# Patient Record
Sex: Male | Born: 1955 | Race: Black or African American | Hispanic: No | Marital: Married | Smoking: Never smoker
Health system: Southern US, Community
[De-identification: ages and names within clinical notes are randomized; demographics above are authoritative.]

## PROBLEM LIST (undated history)

## (undated) DIAGNOSIS — G4730 Sleep apnea, unspecified: Secondary | ICD-10-CM

## (undated) DIAGNOSIS — M1990 Unspecified osteoarthritis, unspecified site: Secondary | ICD-10-CM

## (undated) DIAGNOSIS — L404 Guttate psoriasis: Secondary | ICD-10-CM

## (undated) DIAGNOSIS — I4892 Unspecified atrial flutter: Secondary | ICD-10-CM

## (undated) DIAGNOSIS — I4891 Unspecified atrial fibrillation: Secondary | ICD-10-CM

## (undated) DIAGNOSIS — E785 Hyperlipidemia, unspecified: Secondary | ICD-10-CM

## (undated) DIAGNOSIS — I499 Cardiac arrhythmia, unspecified: Secondary | ICD-10-CM

## (undated) DIAGNOSIS — I10 Essential (primary) hypertension: Secondary | ICD-10-CM

## (undated) HISTORY — DX: Sleep apnea, unspecified: G47.30

## (undated) HISTORY — DX: Unspecified atrial flutter: I48.92

## (undated) HISTORY — DX: Hyperlipidemia, unspecified: E78.5

## (undated) HISTORY — DX: Guttate psoriasis: L40.4

## (undated) HISTORY — PX: TOOTH EXTRACTION: SUR596

## (undated) HISTORY — DX: Essential (primary) hypertension: I10

---
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
## 1998-12-07 ENCOUNTER — Encounter: Payer: Self-pay | Admitting: Emergency Medicine

## 1998-12-08 ENCOUNTER — Inpatient Hospital Stay (HOSPITAL_COMMUNITY): Admission: EM | Admit: 1998-12-08 | Discharge: 1998-12-10 | Payer: Self-pay | Admitting: Emergency Medicine

## 2005-01-14 ENCOUNTER — Ambulatory Visit: Payer: Self-pay | Admitting: Internal Medicine

## 2005-01-21 ENCOUNTER — Ambulatory Visit: Payer: Self-pay | Admitting: Internal Medicine

## 2005-02-17 ENCOUNTER — Ambulatory Visit: Payer: Self-pay | Admitting: Internal Medicine

## 2005-02-27 ENCOUNTER — Ambulatory Visit: Payer: Self-pay | Admitting: Internal Medicine

## 2006-03-25 ENCOUNTER — Ambulatory Visit: Payer: Self-pay | Admitting: Internal Medicine

## 2006-03-25 LAB — CONVERTED CEMR LAB
ALT: 23 units/L (ref 0–40)
AST: 21 units/L (ref 0–37)
Albumin: 3.9 g/dL (ref 3.5–5.2)
Alkaline Phosphatase: 51 units/L (ref 39–117)
BUN: 9 mg/dL (ref 6–23)
Basophils Absolute: 0 10*3/uL (ref 0.0–0.1)
Basophils Relative: 0.4 % (ref 0.0–1.0)
Bilirubin Urine: NEGATIVE
CO2: 32 meq/L (ref 19–32)
Calcium: 9.3 mg/dL (ref 8.4–10.5)
Chloride: 103 meq/L (ref 96–112)
Chol/HDL Ratio, serum: 5.5
Cholesterol: 235 mg/dL (ref 0–200)
Creatinine, Ser: 1.2 mg/dL (ref 0.4–1.5)
Eosinophil percent: 0.9 % (ref 0.0–5.0)
GFR calc non Af Amer: 69 mL/min
Glomerular Filtration Rate, Af Am: 83 mL/min/{1.73_m2}
Glucose, Bld: 123 mg/dL — ABNORMAL HIGH (ref 70–99)
HCT: 48.2 % (ref 39.0–52.0)
HDL: 42.5 mg/dL (ref >39.0)
Hemoglobin, Urine: NEGATIVE
Hemoglobin: 16 g/dL (ref 13.0–17.0)
Ketones, ur: NEGATIVE mg/dL
LDL DIRECT: 187.9 mg/dL
Leukocytes, UA: NEGATIVE
Lymphocytes Relative: 37.2 % (ref 12.0–46.0)
MCHC: 33.1 g/dL (ref 30.0–36.0)
MCV: 91 fL (ref 78.0–100.0)
Monocytes Absolute: 0.4 10*3/uL (ref 0.2–0.7)
Monocytes Relative: 7.8 % (ref 3.0–11.0)
Neutro Abs: 2.5 10*3/uL (ref 1.4–7.7)
Neutrophils Relative %: 53.7 % (ref 43.0–77.0)
Nitrite: NEGATIVE
Platelets: 269 10*3/uL (ref 150–400)
Potassium: 4 meq/L (ref 3.5–5.1)
RBC: 5.29 M/uL (ref 4.22–5.81)
RDW: 12.2 % (ref 11.5–14.6)
Sodium: 141 meq/L (ref 135–145)
Specific Gravity, Urine: 1.02 (ref 1.000–1.03)
TSH: 1.1 microintl units/mL (ref 0.35–5.50)
Total Bilirubin: 0.9 mg/dL (ref 0.3–1.2)
Total Protein, Urine: NEGATIVE mg/dL
Total Protein: 7.1 g/dL (ref 6.0–8.3)
Triglyceride fasting, serum: 63 mg/dL (ref 0–149)
Urine Glucose: NEGATIVE mg/dL
Urobilinogen, UA: 0.2 (ref 0.0–1.0)
VLDL: 13 mg/dL (ref 0–40)
WBC: 4.6 10*3/uL (ref 4.5–10.5)
pH: 6 (ref 5.0–8.0)

## 2006-04-01 ENCOUNTER — Ambulatory Visit: Payer: Self-pay | Admitting: Internal Medicine

## 2006-05-01 ENCOUNTER — Ambulatory Visit: Payer: Self-pay | Admitting: Internal Medicine

## 2006-05-01 LAB — CONVERTED CEMR LAB
ALT: 48 units/L — ABNORMAL HIGH (ref 0–40)
AST: 30 units/L (ref 0–37)
Chol/HDL Ratio, serum: 3.7
Cholesterol: 178 mg/dL (ref 0–200)
HDL: 47.7 mg/dL (ref >39.0)
LDL Cholesterol: 118 mg/dL — ABNORMAL HIGH (ref 0–99)
Triglyceride fasting, serum: 63 mg/dL (ref 0–149)
VLDL: 13 mg/dL (ref 0–40)

## 2007-07-27 ENCOUNTER — Ambulatory Visit: Payer: Self-pay | Admitting: Internal Medicine

## 2007-07-27 LAB — CONVERTED CEMR LAB
ALT: 41 units/L (ref 0–53)
AST: 28 units/L (ref 0–37)
Albumin: 4 g/dL (ref 3.5–5.2)
Alkaline Phosphatase: 67 units/L (ref 39–117)
BUN: 9 mg/dL (ref 6–23)
Basophils Absolute: 0.1 10*3/uL (ref 0.0–0.1)
Basophils Relative: 1.1 % — ABNORMAL HIGH (ref 0.0–1.0)
Bilirubin Urine: NEGATIVE
Bilirubin, Direct: 0.1 mg/dL (ref 0.0–0.3)
CO2: 32 meq/L (ref 19–32)
Calcium: 9.5 mg/dL (ref 8.4–10.5)
Chloride: 102 meq/L (ref 96–112)
Cholesterol: 175 mg/dL (ref 0–200)
Creatinine, Ser: 1.1 mg/dL (ref 0.4–1.5)
Eosinophils Absolute: 0.1 10*3/uL (ref 0.0–0.6)
Eosinophils Relative: 2.4 % (ref 0.0–5.0)
GFR calc Af Amer: 92 mL/min
GFR calc non Af Amer: 76 mL/min
Glucose, Bld: 154 mg/dL — ABNORMAL HIGH (ref 70–99)
HCT: 44.6 % (ref 39.0–52.0)
HDL: 36.4 mg/dL — ABNORMAL LOW (ref >39.0)
Hemoglobin, Urine: NEGATIVE
Hemoglobin: 14.2 g/dL (ref 13.0–17.0)
Ketones, ur: NEGATIVE mg/dL
LDL Cholesterol: 126 mg/dL — ABNORMAL HIGH (ref 0–99)
Leukocytes, UA: NEGATIVE
Lymphocytes Relative: 42 % (ref 12.0–46.0)
MCHC: 31.9 g/dL (ref 30.0–36.0)
MCV: 90.4 fL (ref 78.0–100.0)
Monocytes Absolute: 0.4 10*3/uL (ref 0.2–0.7)
Monocytes Relative: 9.1 % (ref 3.0–11.0)
Neutro Abs: 2.1 10*3/uL (ref 1.4–7.7)
Neutrophils Relative %: 45.4 % (ref 43.0–77.0)
Nitrite: NEGATIVE
Platelets: 281 10*3/uL (ref 150–400)
Potassium: 3.4 meq/L — ABNORMAL LOW (ref 3.5–5.1)
RBC: 4.94 M/uL (ref 4.22–5.81)
RDW: 12.5 % (ref 11.5–14.6)
Sodium: 142 meq/L (ref 135–145)
Specific Gravity, Urine: >=1.03 (ref 1.000–1.03)
TSH: 0.85 microintl units/mL (ref 0.35–5.50)
Total Bilirubin: 0.8 mg/dL (ref 0.3–1.2)
Total CHOL/HDL Ratio: 4.8
Total Protein, Urine: NEGATIVE mg/dL
Total Protein: 7.4 g/dL (ref 6.0–8.3)
Triglycerides: 62 mg/dL (ref 0–149)
Urine Glucose: NEGATIVE mg/dL
Urobilinogen, UA: 0.2 (ref 0.0–1.0)
VLDL: 12 mg/dL (ref 0–40)
WBC: 4.7 10*3/uL (ref 4.5–10.5)
pH: 5 (ref 5.0–8.0)

## 2007-08-03 ENCOUNTER — Ambulatory Visit: Payer: Self-pay | Admitting: Internal Medicine

## 2007-08-03 DIAGNOSIS — E1165 Type 2 diabetes mellitus with hyperglycemia: Secondary | ICD-10-CM

## 2007-08-03 DIAGNOSIS — E118 Type 2 diabetes mellitus with unspecified complications: Secondary | ICD-10-CM | POA: Insufficient documentation

## 2007-08-03 DIAGNOSIS — E1169 Type 2 diabetes mellitus with other specified complication: Secondary | ICD-10-CM | POA: Insufficient documentation

## 2007-08-03 DIAGNOSIS — E1129 Type 2 diabetes mellitus with other diabetic kidney complication: Secondary | ICD-10-CM

## 2007-08-03 DIAGNOSIS — E785 Hyperlipidemia, unspecified: Secondary | ICD-10-CM

## 2007-08-03 DIAGNOSIS — I10 Essential (primary) hypertension: Secondary | ICD-10-CM

## 2007-08-03 DIAGNOSIS — IMO0002 Reserved for concepts with insufficient information to code with codable children: Secondary | ICD-10-CM

## 2007-08-03 HISTORY — DX: Type 2 diabetes mellitus with other diabetic kidney complication: E11.29

## 2007-08-03 HISTORY — DX: Reserved for concepts with insufficient information to code with codable children: IMO0002

## 2007-08-03 HISTORY — DX: Essential (primary) hypertension: I10

## 2007-08-03 HISTORY — DX: Type 2 diabetes mellitus with other specified complication: E11.69

## 2007-09-07 ENCOUNTER — Telehealth: Payer: Self-pay | Admitting: Internal Medicine

## 2007-09-13 ENCOUNTER — Telehealth: Payer: Self-pay | Admitting: Internal Medicine

## 2009-04-23 ENCOUNTER — Ambulatory Visit: Payer: Self-pay | Admitting: Internal Medicine

## 2009-04-23 LAB — CONVERTED CEMR LAB
ALT: 28 units/L (ref 0–53)
AST: 21 units/L (ref 0–37)
Albumin: 3.8 g/dL (ref 3.5–5.2)
Alkaline Phosphatase: 65 units/L (ref 39–117)
BUN: 9 mg/dL (ref 6–23)
Basophils Absolute: 0 10*3/uL (ref 0.0–0.1)
Basophils Relative: 0.8 % (ref 0.0–3.0)
Bilirubin Urine: NEGATIVE
Bilirubin, Direct: 0.1 mg/dL (ref 0.0–0.3)
CO2: 30 meq/L (ref 19–32)
Calcium: 8.9 mg/dL (ref 8.4–10.5)
Chloride: 101 meq/L (ref 96–112)
Cholesterol: 224 mg/dL — ABNORMAL HIGH (ref 0–200)
Creatinine, Ser: 1 mg/dL (ref 0.4–1.5)
Direct LDL: 160.6 mg/dL
Eosinophils Absolute: 0.1 10*3/uL (ref 0.0–0.7)
Eosinophils Relative: 3.1 % (ref 0.0–5.0)
GFR calc non Af Amer: 101.28 mL/min (ref >60)
Glucose, Bld: 260 mg/dL — ABNORMAL HIGH (ref 70–99)
HCT: 47.3 % (ref 39.0–52.0)
HDL: 31.6 mg/dL — ABNORMAL LOW (ref >39.00)
Hemoglobin, Urine: NEGATIVE
Hemoglobin: 15.7 g/dL (ref 13.0–17.0)
Ketones, ur: 15 mg/dL
Leukocytes, UA: NEGATIVE
Lymphocytes Relative: 32.9 % (ref 12.0–46.0)
Lymphs Abs: 1.5 10*3/uL (ref 0.7–4.0)
MCHC: 33.1 g/dL (ref 30.0–36.0)
MCV: 92.8 fL (ref 78.0–100.0)
Monocytes Absolute: 0.4 10*3/uL (ref 0.1–1.0)
Monocytes Relative: 9.3 % (ref 3.0–12.0)
Neutro Abs: 2.6 10*3/uL (ref 1.4–7.7)
Neutrophils Relative %: 53.9 % (ref 43.0–77.0)
Nitrite: NEGATIVE
PSA: 3.43 ng/mL (ref 0.10–4.00)
Platelets: 231 10*3/uL (ref 150.0–400.0)
Potassium: 3.7 meq/L (ref 3.5–5.1)
RBC: 5.1 M/uL (ref 4.22–5.81)
RDW: 12.1 % (ref 11.5–14.6)
Sodium: 138 meq/L (ref 135–145)
Specific Gravity, Urine: >=1.03 (ref 1.000–1.030)
TSH: 1.47 microintl units/mL (ref 0.35–5.50)
Total Bilirubin: 0.9 mg/dL (ref 0.3–1.2)
Total CHOL/HDL Ratio: 7
Total Protein: 6.9 g/dL (ref 6.0–8.3)
Triglycerides: 130 mg/dL (ref 0.0–149.0)
Urine Glucose: >=1000 mg/dL
Urobilinogen, UA: 0.2 (ref 0.0–1.0)
VLDL: 26 mg/dL (ref 0.0–40.0)
WBC: 4.6 10*3/uL (ref 4.5–10.5)
pH: 5.5 (ref 5.0–8.0)

## 2009-04-30 ENCOUNTER — Ambulatory Visit: Payer: Self-pay | Admitting: Internal Medicine

## 2009-04-30 DIAGNOSIS — L408 Other psoriasis: Secondary | ICD-10-CM

## 2009-04-30 HISTORY — DX: Other psoriasis: L40.8

## 2009-05-21 ENCOUNTER — Telehealth: Payer: Self-pay | Admitting: Internal Medicine

## 2009-06-01 ENCOUNTER — Telehealth: Payer: Self-pay | Admitting: Internal Medicine

## 2010-06-06 NOTE — Progress Notes (Signed)
Summary: samples  Phone Note Call from Patient   Caller: Patient Call For: Neena Rhymes MD Summary of Call: Patient called requsting samples or rx for benicar. I advised [facility] can pick up samples to get him through. Initial call taken by: Estell Harpin CMA,  May 21, 2009 3:24 PM

## 2010-06-06 NOTE — Progress Notes (Signed)
Summary: 90 day mail order  Phone Note Call from Patient   Caller: Patient Summary of Call: Patient called [facility] need 90 day rx for Benicar. Initial call taken by: Estell Harpin CMA,  June 01, 2009 5:05 PM  Follow-up for Phone Call        rx sent and pt notified Follow-up by: Estell Harpin CMA,  June 01, 2009 5:08 PM    Prescriptions: BENICAR HCT 40-12.5 MG TABS (OLMESARTAN MEDOXOMIL-HCTZ) 1 by mouth once daily  #90 x 3   Entered by:   Estell Harpin CMA   Authorized by:   Neena Rhymes MD   Signed by:   Estell Harpin CMA on 06/01/2009   Method used:   Faxed to ...       [facility]       [address] [place]  [address]       Ph: [phone]       Fax: [phone]   RxID:   [id number]

## 2010-06-12 ENCOUNTER — Telehealth: Payer: Self-pay | Admitting: Internal Medicine

## 2010-06-20 NOTE — Progress Notes (Signed)
Summary: RF  Phone Note Call from Patient   Summary of Call: Patient is requesting refill of med - no name of med or pharm was given.  Initial call taken by: Charlsie Quest, Suarez,  June 12, 2010 3:04 PM  Follow-up for Phone Call        left mess to call office back..............Marland KitchenCharlsie Quest, CMA  June 12, 2010 6:24 PM   Additional Follow-up for Phone Call Additional follow up Details #1::        Pt needs Benicar called into to [facility]---[phone], pt is almost out. Additional Follow-up by: Denice Paradise,  June 13, 2010 3:20 PM    Additional Follow-up for Phone Call Additional follow up Details #2::    called pt back no ansew lmom onlt sent # 90. Pt is overdue for physical. Must see md b4 addtional refills Follow-up by: Tomma Lightning RMA,  June 13, 2010 3:55 PM  Prescriptions: BENICAR HCT 40-12.5 MG TABS (OLMESARTAN MEDOXOMIL-HCTZ) 1 by mouth once daily  #90 x 0   Entered by:   Tomma Lightning RMA   Authorized by:   Neena Rhymes MD   Signed by:   Tomma Lightning RMA on 06/13/2010   Method used:   Faxed to ...       [facility]       [address] [place]  [address]       Ph: [phone]       Fax: [phone]   RxID:   [id number]

## 2010-07-19 ENCOUNTER — Other Ambulatory Visit: Payer: Self-pay

## 2010-07-19 ENCOUNTER — Encounter (INDEPENDENT_AMBULATORY_CARE_PROVIDER_SITE_OTHER): Payer: Self-pay | Admitting: *Deleted

## 2010-07-19 DIAGNOSIS — Z0000 Encounter for general adult medical examination without abnormal findings: Secondary | ICD-10-CM

## 2010-07-25 ENCOUNTER — Encounter: Payer: Self-pay | Admitting: Internal Medicine

## 2010-07-25 ENCOUNTER — Other Ambulatory Visit (INDEPENDENT_AMBULATORY_CARE_PROVIDER_SITE_OTHER): Payer: BC Managed Care – PPO

## 2010-07-25 ENCOUNTER — Ambulatory Visit (INDEPENDENT_AMBULATORY_CARE_PROVIDER_SITE_OTHER): Payer: BC Managed Care – PPO | Admitting: Internal Medicine

## 2010-07-25 VITALS — BP 158/92 | HR 73 | Temp 98.7°F | Ht 77.0 in | Wt 290.0 lb

## 2010-07-25 DIAGNOSIS — Z0000 Encounter for general adult medical examination without abnormal findings: Secondary | ICD-10-CM

## 2010-07-25 DIAGNOSIS — Z1211 Encounter for screening for malignant neoplasm of colon: Secondary | ICD-10-CM

## 2010-07-25 DIAGNOSIS — E119 Type 2 diabetes mellitus without complications: Secondary | ICD-10-CM

## 2010-07-25 DIAGNOSIS — Z136 Encounter for screening for cardiovascular disorders: Secondary | ICD-10-CM

## 2010-07-25 LAB — HEMOGLOBIN A1C: Hgb A1c MFr Bld: 9.5 % — ABNORMAL HIGH (ref 4.6–6.5)

## 2010-07-25 NOTE — Progress Notes (Signed)
Subjective:    Patient ID: Johnny [name], male    DOB: 1955-05-14, 55 y.o.   MRN: [id number]  HPI Johnny [name] presents for a routine exam. He reports that he is doing well with no new medical problems, injuries, or surgeries since his last visit. He is working out - 7/10 days. He feels well. He has gone to 2nd shift which has disrupted eating schedule  Past Medical History  Diagnosis Date  . Hyperlipidemia   . HTN (hypertension)   . Guttate psoriasis   . Diabetes mellitus    Family History  Problem Relation Age of Onset  . Hypertension Father     DOB [birth year]  . Hyperlipidemia Father   . Diabetes Father   . Coronary artery disease Father     with stents  . Nephrolithiasis Father   . Kidney disease Father     on HD  . Memory loss Mother     DOB 61  . Kidney disease Mother     s/p nephrectomy, infection problem  . Dementia Mother   . Prostate cancer Neg Hx   . Colon cancer Neg Hx    History   Social History  . Marital Status: Married    Spouse Name: N/A    Number of Children: N/A  . Years of Education: 12   Occupational History  . Not on file.   Social History Main Topics  . Smoking status: Passive Smoker  . Smokeless tobacco: Not on file  . Alcohol Use: No  . Drug Use: Not on file  . Sexually Active: Not on file   Other Topics Concern  . Not on file   Social History Narrative   [employer] x 4 yearsMarried '942 sons - '95, '96Wife is healthy        Review of Systems  Constitutional: Negative.   HENT: Negative for hearing loss, dental problem, postnasal drip and ear discharge.        [Saw opthal in Dec'10- no diabetic changes. Allergies and started on drop.   Eyes: Positive for itching. Negative for photophobia, redness and visual disturbance.  Respiratory: Negative.   Cardiovascular: Negative.   Gastrointestinal: Negative.   Genitourinary: Negative.        [Very minimal ED - didn't like viagra, interested in trying  cialis Musculoskeletal:       [Mild knee pain with impact exercise - not limiting Skin: Negative.        [Psoriasis is controlled Neurological: Negative.   Hematological: Negative.   Psychiatric/Behavioral: Negative.        Objective:   Physical Exam  Constitutional: He is oriented to person, place, and time. He appears well-developed and well-nourished.       overweight  HENT:  Head: Normocephalic and atraumatic.  Right Ear: External ear normal.  Left Ear: External ear normal.  Nose: Nose normal.  Mouth/Throat: Oropharynx is clear and moist.  Eyes: Conjunctivae and EOM are normal. Pupils are equal, round, and reactive to light.  Neck: Normal range of motion. Neck supple. No tracheal deviation present.  Cardiovascular: Normal rate, regular rhythm, normal heart sounds and intact distal pulses.   Pulmonary/Chest: Effort normal and breath sounds normal.  Abdominal: Soft. Bowel sounds are normal.       obese  Musculoskeletal: Normal range of motion.  Lymphadenopathy:    He has no cervical adenopathy.  Neurological: He is alert and oriented to person, place, and time. He has normal reflexes.  Skin: Skin is warm and  dry.       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 No other suspicious skin lesions  Psychiatric: He has a normal mood and affect. His behavior is normal. Judgment and thought content normal.  Diabetic foot exam:  Left: Reflexes   Vibratory sensation   Proprioception   Sharp/dull discrimination   Filament test  Right: Reflexes 3+   Vibratory sensation diminished  Proprioception normal  Sharp/dull discrimination normal  Filament test         Assessment & Plan:  1. Diabetes - he has been diet controlled. He is exercising on a regular  Basis but due to change in work shift eating patterns have changed.  Plan - lab ordered with recommendations to follow.  2. Hypertension - controlled on present medications.  3. Obesity - patient is working on diet management and  regular exercise.   4. Lipids - lab order and recommendations will follow: goal is LDL 100 or less.   5. Health Maintenance - he is due for colonoscopy.

## 2010-07-28 ENCOUNTER — Telehealth: Payer: Self-pay | Admitting: Internal Medicine

## 2010-07-28 NOTE — Telephone Encounter (Signed)
Patient needs an OV to discuss treatmment for elevated A1C  Thanks

## 2010-07-30 NOTE — Telephone Encounter (Signed)
lmoam for pt to call back. Tried work number pt not available.

## 2010-08-02 ENCOUNTER — Telehealth: Payer: Self-pay | Admitting: *Deleted

## 2010-08-02 NOTE — Telephone Encounter (Signed)
Pt has appt with Dr Linda Hedges to discuss elevated A1C

## 2010-08-05 ENCOUNTER — Ambulatory Visit (INDEPENDENT_AMBULATORY_CARE_PROVIDER_SITE_OTHER): Payer: BC Managed Care – PPO | Admitting: Internal Medicine

## 2010-08-05 ENCOUNTER — Encounter: Payer: Self-pay | Admitting: Internal Medicine

## 2010-08-05 VITALS — BP 152/102 | HR 72 | Temp 98.4°F | Wt 289.0 lb

## 2010-08-05 DIAGNOSIS — E119 Type 2 diabetes mellitus without complications: Secondary | ICD-10-CM

## 2010-08-05 MED ORDER — METFORMIN HCL 500 MG PO TABS: 500.0000 mg | ORAL_TABLET | Freq: Two times a day (BID) | ORAL | Status: DC

## 2010-08-05 NOTE — Progress Notes (Signed)
  Subjective:    Patient ID: Johnny [name], male    DOB: April 16, 1956, 54 y.o.   MRN: [id number]  HPI Patient returns to discuss recent labs at [facility] - A1C 9.5%!!!!!!  PMH,FamHX, SocHX and ROS reviewed from recent [facility]: no changes.  Review of Systems  [all other systems reviewed and are negative       Objective:   Physical Exam  [vitalsreviewed. Constitutional: He appears well-developed and well-nourished.  Eyes: Conjunctivae and EOM are normal.  Neck: Normal range of motion.  Cardiovascular: Regular rhythm.   Pulmonary/Chest: Effort normal.          Assessment & Plan:  1. Diabetes- out of control. He follow a good diet and is working out.  Plan - metformin 500mg  bid           Present for CBGs in 3 weeks.

## 2010-09-09 ENCOUNTER — Telehealth: Payer: Self-pay | Admitting: *Deleted

## 2010-09-09 MED ORDER — OLMESARTAN MEDOXOMIL 20 MG PO TABS: 20.0000 mg | ORAL_TABLET | Freq: Every day | ORAL | Status: DC

## 2010-09-09 NOTE — Telephone Encounter (Signed)
refill 

## 2010-09-11 ENCOUNTER — Other Ambulatory Visit: Payer: Self-pay | Admitting: *Deleted

## 2010-09-11 MED ORDER — OLMESARTAN MEDOXOMIL-HCTZ 40-12.5 MG PO TABS: 1.0000 | ORAL_TABLET | Freq: Every day | ORAL | Status: DC

## 2010-09-20 NOTE — Assessment & Plan Note (Signed)
[facility]                           PRIMARY CARE OFFICE NOTE   EATHEL, SLONAKER                      MRN:          [id number]  DATE:04/01/2006                            DOB:          20-Jan-1956    [name] is a very pleasant 55 year old African-American gentleman who  presents for evaluation and exam.  He was last seen in the office  February 27, 2006 when we talked about his hypertension.  At that time,  he was started on Crestor and TriCor.  In the interval, he reports that  he has had significant problems with both medications, and has  discontinued the same.  We reviewed his chart in detail.  The patient  had been previously tried on Lipitor, which he actually tolerated well,  but was taken off because of poor control at 20 mg daily.   PAST MEDICAL HISTORY:   SURGICAL:  None.   MEDICAL:  1. The patient had the usual childhood diseases.  2. Hypertension.  3. Hyperlipidemia.  4. Guttate psoriasis.   CURRENT MEDICATIONS:  1. Diovan 320/25 once daily.  2. Viagra on a p.r.n. basis.   FAMILY HISTORY:  Significant for 1 sibling with a history of  tuberculosis, father with diabetes, hypertension, who is now on  dialysis.  History of neurologic disease.   SOCIAL HISTORY:  The patient is married.  He has 1 son.  He continues to  work at [employer] where he has over 20 years of service.  He had been  assigned to third shift, which caused some adjustments in regards to his  daily life cycle.  We also discussed the problems with retirement plans  and pension plans.   REVIEW OF SYSTEMS:  Negative for any constitutional, cardiovascular,  respiratory, GI, or GU problems.  Derm is stable with the patient taking  no treatments for his psoriasis at this time.   EXAMINATION:  Temperature 99.3, blood pressure 157/101, pulse was 82,  weight 289, height 6 feet 3 inches.  GENERAL APPEARANCE:  This is a large-boned African-American gentleman in  no  acute distress.  HEENT:  Normocephalic, atraumatic.  EACs and TMs were unremarkable.  Oropharynx with native dentition, in good repair.  No buccal or palate  lesions were noted.  Posterior pharynx was clear.  Conjunctivae and  sclerae were clear.  PERRLA.  EOMI.  Funduscopic exam was unremarkable.  NECK:  Supple without thyromegaly.  NODES:  No adenopathy was noted in the cervical, supraclavicular  regions.  CHEST:  No CVA tenderness.  LUNGS:  Clear to auscultation and percussion.  CARDIOVASCULAR:  With 2+ radial pulses.  No JVD or carotid bruits.  He  had a quiet precordium with regular rate and rhythm, without murmurs,  rubs, or gallops.  ABDOMEN:  Soft.  No guarding or rebound.  No organo-splenomegaly was  noted.  GENITALIA:  Normal male.  He had bilaterally descended testicles without  masses.  RECTAL:  Normal sphincter tone.  Prostate was smooth, round, normal in  size and contour without nodules.  EXTREMITIES:  Without cyanosis, clubbing, edema, or deformity.  NEUROLOGIC:  Nonfocal.  DERM:  The patient has maculopapular skin changes consistent with  Guttate psoriasis with no signs of excoriation or otherwise problematic  flare.   LABORATORY:  Hemoglobin was 16 g, white count was 4600 with a normal  differential.  Chemistries reveal a normal potassium and sodium.  Glucose was elevated at 123.  Kidney function normal with a creatinine  of 1.2 and a GFR of 83.  Liver functions were normal.  Cholesterol 235,  triglycerides 63, HDL 42.5, LDL 187.9.  Thyroid function normal with a  TSH of 1.1.  Urinalysis was negative.   ASSESSMENT AND PLAN:  1. Hypertension.  The patient reports that, when he has his blood      pressure checked at work by a nurse, his blood pressure is much      better, 130s over 80s.  Plan:  The patient is to continue his      present medications and continue monitoring.  2. Hyperlipidemia.  The patient has been poorly controlled.  He has      tried multiple  medications in the past.  At this time, we will give      him a re-trial of Lipitor at 40 mg daily.  He will return in 3-and-      a-half weeks for laboratory.  We will make further adjustments in      his medications as needed.  3. Derm.  The patient has stable Guttate psoriasis with no need for      further evaluation or treatment.  4. Health maintenance.  The patient had an unremarkable exam      otherwise.  Prostate exam was normal.  The patient would be a      candidate for colorectal cancer screening at age 22.   Certainly, he is a very pleasant gentleman who seems medically stable  with the biggest problem being lipid management.   PLAN:  As outlined above.  The patient will be notified by phone tree as  to his lab results.  If he is not well-controlled and he tolerates  Lipitor, the next step would be to go to 80 mg daily.     Heinz Knuckles Norins, MD  Electronically Signed    MEN/MedQ  DD: 04/02/2006  DT: 04/02/2006  Job #: [id number]   cc:   Lucious Groves

## 2010-10-02 ENCOUNTER — Other Ambulatory Visit: Payer: Self-pay | Admitting: *Deleted

## 2010-10-02 MED ORDER — METFORMIN HCL 500 MG PO TABS: 500.0000 mg | ORAL_TABLET | Freq: Every day | ORAL | Status: DC

## 2011-09-08 ENCOUNTER — Other Ambulatory Visit: Payer: Self-pay | Admitting: Internal Medicine

## 2011-11-04 ENCOUNTER — Encounter: Payer: Self-pay | Admitting: Internal Medicine

## 2011-11-04 ENCOUNTER — Other Ambulatory Visit (INDEPENDENT_AMBULATORY_CARE_PROVIDER_SITE_OTHER): Payer: BC Managed Care – PPO

## 2011-11-04 ENCOUNTER — Ambulatory Visit (INDEPENDENT_AMBULATORY_CARE_PROVIDER_SITE_OTHER): Payer: BC Managed Care – PPO | Admitting: Internal Medicine

## 2011-11-04 VITALS — BP 184/102 | HR 88 | Temp 98.4°F | Resp 16 | Ht 77.0 in | Wt 286.0 lb

## 2011-11-04 DIAGNOSIS — I10 Essential (primary) hypertension: Secondary | ICD-10-CM

## 2011-11-04 DIAGNOSIS — E785 Hyperlipidemia, unspecified: Secondary | ICD-10-CM

## 2011-11-04 DIAGNOSIS — E119 Type 2 diabetes mellitus without complications: Secondary | ICD-10-CM

## 2011-11-04 DIAGNOSIS — Z23 Encounter for immunization: Secondary | ICD-10-CM

## 2011-11-04 DIAGNOSIS — Z125 Encounter for screening for malignant neoplasm of prostate: Secondary | ICD-10-CM

## 2011-11-04 DIAGNOSIS — Z0000 Encounter for general adult medical examination without abnormal findings: Secondary | ICD-10-CM

## 2011-11-04 LAB — COMPREHENSIVE METABOLIC PANEL
AST: 18 U/L (ref 0–37)
Albumin: 3.8 g/dL (ref 3.5–5.2)
BUN: 12 mg/dL (ref 6–23)
Calcium: 9.5 mg/dL (ref 8.4–10.5)
Chloride: 104 mEq/L (ref 96–112)
Glucose, Bld: 130 mg/dL — ABNORMAL HIGH (ref 70–99)
Potassium: 3.9 mEq/L (ref 3.5–5.1)
Sodium: 142 mEq/L (ref 135–145)
Total Protein: 7.1 g/dL (ref 6.0–8.3)

## 2011-11-04 LAB — LDL CHOLESTEROL, DIRECT: Direct LDL: 152.6 mg/dL

## 2011-11-04 LAB — HEPATIC FUNCTION PANEL
ALT: 30 U/L (ref 0–53)
Bilirubin, Direct: 0 mg/dL (ref 0.0–0.3)
Total Protein: 7.1 g/dL (ref 6.0–8.3)

## 2011-11-04 LAB — HEMOGLOBIN A1C: Hgb A1c MFr Bld: 9 % — ABNORMAL HIGH (ref 4.6–6.5)

## 2011-11-04 LAB — PSA: PSA: 7.47 ng/mL — ABNORMAL HIGH (ref 0.10–4.00)

## 2011-11-04 NOTE — Progress Notes (Signed)
Subjective:    Patient ID: Johnny [name], male    DOB: 1955-11-10, 56 y.o.   MRN: [id number]  HPI Mr. Menzel presents for follow-up exam for HTN, DM. He has been feeling well. Last A1c 09.5% at which time he was started on metformin, no follow-up lab done (error in the lab). He has had some increased stress: mother with progressive dementia; father s/p renal transplant - rejected. No major illness or injury.   Past Medical History  Diagnosis Date  . Hyperlipidemia   . HTN (hypertension)   . Guttate psoriasis   . Diabetes mellitus    No past surgical history on file. Family History  Problem Relation Age of Onset  . Hypertension Father     DOB [birth year]  . Hyperlipidemia Father   . Diabetes Father   . Coronary artery disease Father     with stents  . Nephrolithiasis Father   . Kidney disease Father     s/p rejected kidney transplant; on HD  . Memory loss Mother     DOB 28  . Kidney disease Mother     s/p nephrectomy, infection problem  . Dementia Mother   . Prostate cancer Neg Hx   . Colon cancer Neg Hx   . Cancer Brother     lung  . HIV Brother    History   Social History  . Marital Status: Married    Spouse Name: N/A    Number of Children: 2  . Years of Education: 12   Occupational History  . mfg    Social History Main Topics  . Smoking status: Passive Smoker  . Smokeless tobacco: Never Used  . Alcohol Use: No  . Drug Use: No  . Sexually Active: Yes   Other Topics Concern  . Not on file   Social History Narrative   [employer]. [employer]. [employer] x 4 years. Married '94. 2 sons - '95, '96. Wife is healthy. Work - Banker.   Current Outpatient Prescriptions on File Prior to Visit  Medication Sig Dispense Refill  . BENICAR HCT 40-12.5 MG per tablet TAKE 1 TABLET BY MOUTH DAILY  90 tablet  0  . metFORMIN (GLUCOPHAGE) 500 MG tablet TAKE 1 TABLET EVERY DAY WITH BREAKFAST  90 tablet  0  . PATADAY 0.2 % SOLN       . metFORMIN (GLUCOPHAGE) 500 MG tablet Take 1 tablet  (500 mg total) by mouth 2 (two) times daily with a meal.  60 tablet  11  . olmesartan (BENICAR) 20 MG tablet Take 1 tablet (20 mg total) by mouth daily.  30 tablet  11       Review of Systems Constitutional:  Negative for fever, chills, activity change and unexpected weight change:  Went up and then a big loss - intentional.  HEENT:  Negative for hearing loss, but decrease right ear, right ear pain;  congestion, neck stiffness and postnasal drip. Negative for sore throat or swallowing problems. Negative for dental complaints.   Eyes: Negative for vision loss or change in visual acuity.  Respiratory: Negative for chest tightness and wheezing. Negative for DOE.   Cardiovascular: Negative for chest pain or palpitations. No decreased exercise tolerance Gastrointestinal: No change in bowel habit. No bloating or gas. No reflux or indigestion Genitourinary: Negative for urgency, frequency, flank pain and difficulty urinating. Can have some nocturia - depends on what he drink. Musculoskeletal: Negative for myalgias, back pain, arthralgias and gait problem.  Neurological: Negative for dizziness, tremors,  weakness and headaches.  Hematological: Negative for adenopathy.  Psychiatric/Behavioral: Negative for behavioral problems and dysphoric mood.       Objective:   Physical Exam Filed Vitals:   11/04/11 1433  BP: 184/102  Pulse: 88  Temp: 98.4 F (36.9 C)  Resp: 16   Wt Readings from Last 3 Encounters:  11/04/11 286 lb (129.729 kg)  08/05/10 289 lb (131.09 kg)  07/25/10 290 lb (131.543 kg)  BMI 34 Gen'l: Well nourished well developed ,, large framed AA male in no acute distress  HEENT: Head: Normocephalic and atraumatic. Right Ear: External ear normal. EAC/TM nl. Left Ear: External ear normal.  EAC/TM nl. Nose: Nose normal. Mouth/Throat: Oropharynx is clear and moist. Dentition - native, in good repair. No buccal or palatal lesions. Posterior pharynx clear. Eyes: Conjunctivae and sclera  clear. EOM intact. Pupils are equal, round, and reactive to light. Fundi - w/o AV nicking, normal disc margin. Right eye exhibits no discharge. Left eye exhibits no discharge. Neck: Normal range of motion. Neck supple. No JVD present. No tracheal deviation present. No thyromegaly present.  Cardiovascular: Normal rate, regular rhythm, no gallop, no friction rub, no murmur heard.      Quiet precordium. 2+ radial and DP pulses . No carotid bruits Pulmonary/Chest: Effort normal. No respiratory distress or increased WOB, no wheezes, no rales. No chest wall deformity or CVAT. Abdominal: Soft. Bowel sounds are normal in all quadrants. He exhibits no distension, no tenderness, no rebound or guarding, No heptosplenomegaly  Genitourinary:  Deferred to PSA Musculoskeletal: Normal range of motion. He exhibits no edema and no tenderness.       Small and large joints without redness, synovial thickening or deformity. Full range of motion preserved about all small, median and large joints.  Lymphadenopathy:    He has no cervical or supraclavicular adenopathy.  Neurological: He is alert and oriented to person, place, and time. CN II-XII intact. DTRs 2+ and symmetrical biceps, radial and patellar tendons. Cerebellar function normal with no tremor, rigidity, normal gait and station. Right foot - decreased light touch sensation distally, normal pin-prick sensation, decreased deep vibratory sensation ankle and toe. Skin: Skin is warm and dry. Guttate psoriasis changes extensor surfaces. Callus formation on great toe and heel.  No erythema.  Psychiatric: He has a normal mood and affect. His behavior is normal. Thought content normal.   Lab Results  Component Value Date   WBC 4.6 04/23/2009   HGB 15.7 04/23/2009   HCT 47.3 04/23/2009   PLT 231.0 04/23/2009   GLUCOSE 130* 11/04/2011   CHOL 230* 11/04/2011   TRIG 110.0 11/04/2011   HDL 45.70 11/04/2011   LDLDIRECT 152.6 11/04/2011   LDLCALC 126* 07/27/2007        ALT 30  11/04/2011   AST 18 11/04/2011        NA 142 11/04/2011   K 3.9 11/04/2011   CL 104 11/04/2011   CREATININE 1.3 11/04/2011   BUN 12 11/04/2011   CO2 30 11/04/2011   TSH 1.47 04/23/2009   PSA 7.47* 11/04/2011   HGBA1C 9.0* 11/04/2011          Assessment & Plan:

## 2011-11-06 DIAGNOSIS — Z0001 Encounter for general adult medical examination with abnormal findings: Secondary | ICD-10-CM | POA: Insufficient documentation

## 2011-11-06 DIAGNOSIS — Z0000 Encounter for general adult medical examination without abnormal findings: Secondary | ICD-10-CM | POA: Insufficient documentation

## 2011-11-06 NOTE — Assessment & Plan Note (Signed)
Interval history without acute illness or surgery. Exam notable for weight issues. Labs - poor cholesterol control, poor control of diabetes and elevated PSA @ 7.47 (3.43 Dec '10). Johnny [name] is due for colonoscopy. Immunizations are up to date.  In summary - a nice man who is not medically stable. Johnny [name] will be invited for a ROV to address his hyperlipidemia, poorly controlled diabetes and hypertension and to discuss evaluation of elevated PSA.

## 2011-11-06 NOTE — Assessment & Plan Note (Signed)
A1C @ 9%  Reflects poor control. Current dose of metformin listed as 500 mg daily.  Plan - ROV to discuss adjusting regimen and improving diet.

## 2011-11-06 NOTE — Assessment & Plan Note (Signed)
LDL cholesterol increased from previous reading and significantly above goal of 100 or less for a diabetic.  Plan - ROV to discuss statin therapy

## 2011-11-06 NOTE — Assessment & Plan Note (Addendum)
BP Readings from Last 3 Encounters:  11/04/11 184/102  08/05/10 152/102  07/25/10 158/92   Very poor control. Currently taking Benicar/Hct 40/12.5  Plan - return OV for BP check and if elevated medication adjustment

## 2011-11-17 ENCOUNTER — Telehealth: Payer: Self-pay | Admitting: Internal Medicine

## 2011-11-17 NOTE — Telephone Encounter (Signed)
I have been trying to reach this patient this past week.  I have called on 11/07/11 @ 9:10am, 11/10/11 @ 9am, 11/11/11 @2 :15pm, and have this am 11/17/11 @ 8am.  I left a voice mail message last week and this morning on the home number.  The cell number is no longer in service.  I will keep trying to reach this patient to schedule appt.   Thanks!

## 2011-11-17 NOTE — Telephone Encounter (Signed)
Message copied by Richrd Sox on Mon Nov 17, 2011  8:13 AM ------      Message from: Adella Hare E      Created: Thu Nov 06, 2011  4:03 AM       Needs follow up in 7-10 days

## 2011-11-20 NOTE — Telephone Encounter (Signed)
Yes please - a letter is the next step. THANKS

## 2011-11-20 NOTE — Telephone Encounter (Signed)
Tried calling patient this am on his home and cell number.  The cell number still is not working, and the patient, nor a voice mail picks up the home number.   Do you want a letter sent asking him to contact the office for an apt?   Thanks!

## 2011-11-21 ENCOUNTER — Telehealth: Payer: Self-pay | Admitting: Internal Medicine

## 2011-11-21 NOTE — Telephone Encounter (Signed)
Letter mailed to patient's home address -  11/21/11

## 2011-12-08 ENCOUNTER — Other Ambulatory Visit: Payer: Self-pay | Admitting: Internal Medicine

## 2012-01-07 ENCOUNTER — Other Ambulatory Visit: Payer: Self-pay | Admitting: Internal Medicine

## 2012-02-03 ENCOUNTER — Other Ambulatory Visit: Payer: Self-pay | Admitting: *Deleted

## 2012-02-03 MED ORDER — OLMESARTAN MEDOXOMIL-HCTZ 40-12.5 MG PO TABS: 1.0000 | ORAL_TABLET | Freq: Every day | ORAL | Status: DC

## 2012-08-06 ENCOUNTER — Other Ambulatory Visit: Payer: Self-pay | Admitting: Internal Medicine

## 2012-09-16 ENCOUNTER — Other Ambulatory Visit: Payer: Self-pay | Admitting: Internal Medicine

## 2012-10-27 ENCOUNTER — Ambulatory Visit (INDEPENDENT_AMBULATORY_CARE_PROVIDER_SITE_OTHER): Payer: BC Managed Care – PPO | Admitting: Internal Medicine

## 2012-10-27 ENCOUNTER — Ambulatory Visit: Payer: Self-pay | Admitting: Family Medicine

## 2012-10-27 ENCOUNTER — Encounter: Payer: Self-pay | Admitting: Internal Medicine

## 2012-10-27 ENCOUNTER — Ambulatory Visit: Payer: BC Managed Care – PPO | Admitting: Internal Medicine

## 2012-10-27 VITALS — BP 152/100 | HR 85 | Temp 98.9°F | Ht 77.0 in | Wt 306.5 lb

## 2012-10-27 DIAGNOSIS — R062 Wheezing: Secondary | ICD-10-CM | POA: Insufficient documentation

## 2012-10-27 DIAGNOSIS — I10 Essential (primary) hypertension: Secondary | ICD-10-CM

## 2012-10-27 DIAGNOSIS — J209 Acute bronchitis, unspecified: Secondary | ICD-10-CM

## 2012-10-27 MED ORDER — HYDROCODONE-HOMATROPINE 5-1.5 MG/5ML PO SYRP: 5.0000 mL | ORAL_SOLUTION | Freq: Four times a day (QID) | ORAL | Status: DC | PRN

## 2012-10-27 MED ORDER — LEVOFLOXACIN 250 MG PO TABS: 250.0000 mg | ORAL_TABLET | Freq: Every day | ORAL | Status: DC

## 2012-10-27 MED ORDER — PREDNISONE 10 MG PO TABS: ORAL_TABLET | ORAL | Status: DC

## 2012-10-27 NOTE — Progress Notes (Signed)
Subjective:    Patient ID: Johnny [name], male    DOB: Jan 02, 1956, 57 y.o.   MRN: [id number]  HPI  Here with acute onset mild to mod 2-3 days ST, HA, general weakness and malaise, with prod cough greenish sputum, but Pt denies chest pain, increased sob or doe, wheezing, orthopnea, PND, increased LE swelling, palpitations, dizziness or syncope, except for onset mild wheezing last PM.  Out for 3 days, just not getting better.  Pt denies new neurological symptoms such as new headache, or facial or extremity weakness or numbness   Pt denies polydipsia, polyuria, Past Medical History  Diagnosis Date  . Hyperlipidemia   . HTN (hypertension)   . Guttate psoriasis   . Diabetes mellitus    No past surgical history on file.  reports that he has been passively smoking.  He has never used smokeless tobacco. He reports that he does not drink alcohol or use illicit drugs. family history includes Cancer in his brother; Coronary artery disease in his father; Dementia in his mother; Diabetes in his father; HIV in his brother; Hyperlipidemia in his father; Hypertension in his father; Kidney disease in his father and mother; Memory loss in his mother; and Nephrolithiasis in his father.  There is no history of Prostate cancer and Colon cancer. No Known Allergies Current Outpatient Prescriptions on File Prior to Visit  Medication Sig Dispense Refill  . metFORMIN (GLUCOPHAGE) 500 MG tablet TAKE 1 TABLET BY MOUTH WITH BREAKFAST**MUST MAKE FOLLOW UP APPOINTMENT FOR FURTHER REFILLS**  30 tablet  6  . olmesartan-hydrochlorothiazide (BENICAR HCT) 40-12.5 MG per tablet Take 1 tablet by mouth daily.  90 tablet  3  . PATADAY 0.2 % SOLN       . metFORMIN (GLUCOPHAGE) 500 MG tablet Take 1 tablet (500 mg total) by mouth 2 (two) times daily with a meal.  60 tablet  11  . olmesartan (BENICAR) 20 MG tablet Take 1 tablet (20 mg total) by mouth daily.  30 tablet  11   No current facility-administered medications on file prior  to visit.   Review of Systems  Constitutional: Negative for unexpected weight change, or unusual diaphoresis  HENT: Negative for tinnitus.   Eyes: Negative for photophobia and visual disturbance.  Respiratory: Negative for choking and stridor.   Gastrointestinal: Negative for vomiting and blood in stool.  Genitourinary: Negative for hematuria and decreased urine volume.  Musculoskeletal: Negative for acute joint swelling Skin: Negative for color change and wound.  Neurological: Negative for tremors and numbness other than noted  Psychiatric/Behavioral: Negative for decreased concentration or  hyperactivity.       Objective:   Physical Exam BP 152/100  Pulse 85  Temp(Src) 98.9 F (37.2 C) (Oral)  Ht 6\' 5"  (1.956 m)  Wt 306 lb 8 oz (139.027 kg)  BMI 36.34 kg/m2  SpO2 94% VS noted, mild ill  Constitutional: Pt appears well-developed and well-nourished.  HENT: Head: NCAT.  Right Ear: External ear normal.  Left Ear: External ear normal.  Bilat tm's with mild erythema.  Max sinus areas non tender.  Pharynx with mild erythema, no exudate Eyes: Conjunctivae and EOM are normal. Pupils are equal, round, and reactive to light.  Neck: Normal range of motion. Neck supple.  Cardiovascular: Normal rate and regular rhythm.   Pulmonary/Chest: Effort normal and breath sounds decreased with few wheeze bilat.  Neurological: Pt is alert. Not confused  Skin: Skin is warm. No erythema. No LE edema Psychiatric: Pt behavior is normal. Thought content  normal.         Assessment & Plan:

## 2012-10-27 NOTE — Assessment & Plan Note (Signed)
Mild elev today, likely reactive, to cont med as is, gave samples, f/u PCP next visit

## 2012-10-27 NOTE — Patient Instructions (Signed)
Please take all new medication as prescribed - the antibiotic, cough med, and short low dose prednisone Please continue all other medications as before Please continue to monitor your Blood Pressure at home, with the goal being < 140/90 Please keep your appointments with your PCP as you have planned Please call or return for any worsening symptoms such as fever, pain , cough , wheezing, shortness of breath or other

## 2012-10-27 NOTE — Assessment & Plan Note (Signed)
/  Mild to mod, for predpac asd,  to f/u any worsening symptoms or concerns 

## 2012-10-27 NOTE — Assessment & Plan Note (Signed)
Mild to mod, for antibx course,  to f/u any worsening symptoms or concerns 

## 2012-11-04 ENCOUNTER — Encounter: Payer: BC Managed Care – PPO | Admitting: Internal Medicine

## 2012-11-18 ENCOUNTER — Ambulatory Visit (INDEPENDENT_AMBULATORY_CARE_PROVIDER_SITE_OTHER): Payer: BC Managed Care – PPO | Admitting: Internal Medicine

## 2012-11-18 ENCOUNTER — Encounter: Payer: Self-pay | Admitting: Internal Medicine

## 2012-11-18 VITALS — BP 148/82 | HR 76 | Temp 98.0°F | Resp 12 | Ht 77.0 in | Wt 303.0 lb

## 2012-11-18 DIAGNOSIS — E785 Hyperlipidemia, unspecified: Secondary | ICD-10-CM

## 2012-11-18 DIAGNOSIS — Z0000 Encounter for general adult medical examination without abnormal findings: Secondary | ICD-10-CM

## 2012-11-18 DIAGNOSIS — I10 Essential (primary) hypertension: Secondary | ICD-10-CM

## 2012-11-18 DIAGNOSIS — Z125 Encounter for screening for malignant neoplasm of prostate: Secondary | ICD-10-CM

## 2012-11-18 DIAGNOSIS — Z1211 Encounter for screening for malignant neoplasm of colon: Secondary | ICD-10-CM

## 2012-11-18 DIAGNOSIS — E119 Type 2 diabetes mellitus without complications: Secondary | ICD-10-CM

## 2012-11-18 NOTE — Patient Instructions (Addendum)
Good to see you. Please pass along my regards to your father.  Diabetes - will check an A1C today with recommendations to follow if necessary  Blood pressure:  BP Readings from Last 3 Encounters:  11/18/12 148/82  10/27/12 152/100  11/04/11 184/102   In office readings are a bit high. Please monitor at home or work to be sure the top number is in the mid 130's Continue your present medications. Lab today to check kidney function and electrolytes  Venous insufficiency - fluid build up due to gravity. Plan Support hosiery - not Rx - if you are going to be on your feet for an extended period of time.  Health maintenance - immunizations are up to date. Will have you scheduled for a colonoscopy. You will hear from the office about dates and times.    Venous Stasis and Chronic Venous Insufficiency As people age, the veins located in their legs may weaken and stretch. When veins weaken and lose the ability to pump blood effectively, the condition is called chronic venous insufficiency (CVI) or venous stasis. Almost all veins return blood back to the heart. This happens by:  The force of the heart pumping fresh blood pushes blood back to the heart.  Blood flowing to the heart from the force of gravity. In the deep veins of the legs, blood has to fight gravity and flow upstream back to the heart. Here, the leg muscles contract to pump blood back toward the heart. Vein walls are elastic, and many veins have small valves that only allow blood to flow in one direction. When leg muscles contract, they push inward against the elastic vein walls. This squeezes blood upward, opens the valves, and moves blood toward the heart. When leg muscles relax, the vein wall also relaxes and the valves inside the vein close to prevent blood from flowing backward. This method of pumping blood out of the legs is called the venous pump. CAUSES  The venous pump works best while walking and leg muscles are contracting.  But when a person sits or stands, blood pressure in leg veins can build. Deep veins are usually able to withstand short periods of inactivity, but long periods of inactivity (and increased pressure) can stretch, weaken, and damage vein walls. High blood pressure can also stretch and damage vein walls. The veins may no longer be able to pump blood back to the heart. Venous hypertension (high blood pressure inside veins) that lasts over time is a primary cause of CVI. CVI can also be caused by:   Deep vein thrombosis, a condition where a thrombus (blood clot) blocks blood flow in a vein.  Phlebitis, an inflammation of a superficial vein that causes a blood clot to form. Other risk factors for CVI may include:   Heredity.  Obesity.  Pregnancy.  Sedentary lifestyle.  Smoking.  Jobs requiring long periods of standing or sitting in one place.  Age and gender:  Women in their 17's and 51's and men in their 47's are more prone to developing CVI. SYMPTOMS  Symptoms of CVI may include:   Varicose veins.  Ulceration or skin breakdown.  Lipodermatosclerosis, a condition that affects the skin just above the ankle, usually on the inside surface. Over time the skin becomes brown, smooth, tight and often painful. Those with this condition have a high risk of developing skin ulcers.  Reddened or discolored skin on the leg.  Swelling. DIAGNOSIS  Your caregiver can diagnose CVI after performing a careful medical  history and physical examination. To confirm the diagnosis, the following tests may also be ordered:   Duplex ultrasound.  Plethysmography (tests blood flow).  Venograms (x-ray using a special dye). TREATMENT The goals of treatment for CVI are to restore a person to an active life and to minimize pain or disability. Typically, CVI does not pose a serious threat to life or limb, and with proper treatment most people with this condition can continue to lead active lives. In most cases,  mild CVI can be treated on an outpatient basis with simple procedures. Treatment methods include:   Elastic compression socks.  Sclerotherapy, a procedure involving an injection of a material that "dissolves" the damaged veins. Other veins in the network of blood vessels take over the function of the damaged veins.  Vein stripping (an older procedure less commonly used).  Laser Ablation surgery.  Valve repair. HOME CARE INSTRUCTIONS   Elastic compression socks must be worn every day. They can help with symptoms and lower the chances of the problem getting worse, but they do not cure the problem.  Only take over-the-counter or prescription medicines for pain, discomfort, or fever as directed by your caregiver.  Your caregiver will review your other medications with you. SEEK MEDICAL CARE IF:   You are confused about how to take your medications.  There is redness, swelling, or increasing pain in the affected area.  There is a red streak or line that extends up or down from the affected area.  There is a breakdown or loss of skin in the affected area, even if the breakdown is small.  You develop an unexplained oral temperature above 102 F (38.9 C).  There is an injury to the affected area. SEEK IMMEDIATE MEDICAL CARE IF:   There is an injury and open wound to the affected area.  Pain is not adequately relieved with pain medication prescribed or becomes severe.  An oral temperature above 102 F (38.9 C) develops.  The foot/ankle below the affected area becomes suddenly numb or the area feels weak and hard to move. MAKE SURE YOU:   Understand these instructions.  Will watch your condition.  Will get help right away if you are not doing well or get worse. Document Released: 08/25/2006 Document Revised: 07/14/2011 Document Reviewed: 11/02/2006 [facility] Patient Information 2014 [place], [place].

## 2012-11-18 NOTE — Progress Notes (Signed)
Subjective:    Patient ID: Johnny [name], male    DOB: April 22, 1956, 57 y.o.   MRN: [id number]  HPI Mr. Rohrer presents for a wellness exam. He says he is feeling well except for a slight cough in the AM that stated with a case of bronchitis in June. Dr. Gwynn Burly note reviewed from 6/25. He has made a full recovery.  He has had eye exam, current with his dentist, he has had a work related hearing with a little loss of high frequency left ear.   Past Medical History  Diagnosis Date  . Hyperlipidemia   . HTN (hypertension)   . Guttate psoriasis   . Diabetes mellitus    History reviewed. No pertinent past surgical history. Family History  Problem Relation Age of Onset  . Hypertension Father     DOB [birth year]  . Hyperlipidemia Father   . Diabetes Father   . Coronary artery disease Father     with stents  . Nephrolithiasis Father   . Kidney disease Father     s/p rejected kidney transplant; on HD  . Memory loss Mother     DOB 19  . Kidney disease Mother     s/p nephrectomy, infection problem  . Dementia Mother   . Prostate cancer Neg Hx   . Colon cancer Neg Hx   . Cancer Brother     lung  . HIV Brother    History   Social History  . Marital Status: Married    Spouse Name: N/A    Number of Children: 2  . Years of Education: 12   Occupational History  . mfg    Social History Main Topics  . Smoking status: Passive Smoke Exposure - Never Smoker  . Smokeless tobacco: Never Used  . Alcohol Use: No  . Drug Use: No  . Sexually Active: Yes   Other Topics Concern  . Not on file   Social History Narrative   [employer]. [employer]. [employer] x 4 years. Married '94. 2 sons - '95, '96. Wife is healthy. Work - Banker.    Current Outpatient Prescriptions on File Prior to Visit  Medication Sig Dispense Refill  . metFORMIN (GLUCOPHAGE) 500 MG tablet TAKE 1 TABLET BY MOUTH WITH BREAKFAST**MUST MAKE FOLLOW UP APPOINTMENT FOR FURTHER REFILLS**  30 tablet  6  .  olmesartan-hydrochlorothiazide (BENICAR HCT) 40-12.5 MG per tablet Take 1 tablet by mouth daily.  90 tablet  3  . PATADAY 0.2 % SOLN       . metFORMIN (GLUCOPHAGE) 500 MG tablet Take 1 tablet (500 mg total) by mouth 2 (two) times daily with a meal.  60 tablet  11  . olmesartan (BENICAR) 20 MG tablet Take 1 tablet (20 mg total) by mouth daily.  30 tablet  11   No current facility-administered medications on file prior to visit.     Review of Systems Constitutional:  Negative for fever, chills, activity change and unexpected weight change.  HEENT:  Negative for hearing loss, ear pain, congestion, neck stiffness and postnasal drip. Negative for sore throat or swallowing problems. Negative for dental complaints.   Eyes: Negative for vision loss or change in visual acuity.  Respiratory: Negative for chest tightness and wheezing. Negative for DOE.   Cardiovascular: Negative for chest pain or palpitations. No decreased exercise tolerance Gastrointestinal: No change in bowel habit. No bloating or gas. No reflux or indigestion Genitourinary: Negative for urgency, frequency, flank pain and difficulty urinating.  Musculoskeletal: Negative for myalgias,  back pain, arthralgias and gait problem.  Neurological: Negative for dizziness, tremors, weakness and headaches.  Hematological: Negative for adenopathy.  Psychiatric/Behavioral: Negative for behavioral problems and dysphoric mood.       Objective:   Physical Exam Filed Vitals:   11/18/12 1107  BP: 148/82  Pulse: 76  Temp: 98 F (36.7 C)  Resp: 12   Wt Readings from Last 3 Encounters:  11/18/12 303 lb (137.44 kg)  10/27/12 306 lb 8 oz (139.027 kg)  11/04/11 286 lb (129.729 kg)   Gen'l: Well nourished well developed     male in no acute distress  HEENT: Head: Normocephalic and atraumatic. Right Ear: External ear normal. EAC/TM nl. Left Ear: External ear normal.  EAC/TM nl. Nose: Nose normal. Mouth/Throat: Oropharynx is clear and moist.  Dentition - native, in good repair. No buccal or palatal lesions. Posterior pharynx clear. Eyes: Conjunctivae and sclera clear. EOM intact. Pupils are equal, round, and reactive to light. Right eye exhibits no discharge. Left eye exhibits no discharge. Neck: Normal range of motion. Neck supple. No JVD present. No tracheal deviation present. No thyromegaly present.  Cardiovascular: Normal rate, regular rhythm, no gallop, no friction rub, no murmur heard.      Quiet precordium. 2+ radial and DP pulses . No carotid bruits Pulmonary/Chest: Effort normal. No respiratory distress or increased WOB, no wheezes, no rales. No chest wall deformity or CVAT. Abdomen: Soft. Bowel sounds are normal in all quadrants. He exhibits no distension, no tenderness, no rebound or guarding, No heptosplenomegaly  Genitourinary:   Musculoskeletal: Normal range of motion. He exhibits no edema and no tenderness.       Small and large joints without redness, synovial thickening or deformity. Full range of motion preserved about all small, median and large joints.  Lymphadenopathy:    He has no cervical or supraclavicular adenopathy.  Neurological: He is alert and oriented to person, place, and time. CN II-XII intact. DTRs 2+ and symmetrical biceps, radial and patellar tendons. Cerebellar function normal with no tremor, rigidity, normal gait and station.  Skin: Skin is warm and dry. No rash noted. No erythema.  Psychiatric: He has a normal mood and affect. His behavior is normal. Thought content normal.         Assessment & Plan:

## 2012-11-19 ENCOUNTER — Other Ambulatory Visit (INDEPENDENT_AMBULATORY_CARE_PROVIDER_SITE_OTHER): Payer: BC Managed Care – PPO

## 2012-11-19 DIAGNOSIS — E119 Type 2 diabetes mellitus without complications: Secondary | ICD-10-CM

## 2012-11-19 DIAGNOSIS — I10 Essential (primary) hypertension: Secondary | ICD-10-CM

## 2012-11-19 DIAGNOSIS — E785 Hyperlipidemia, unspecified: Secondary | ICD-10-CM

## 2012-11-19 DIAGNOSIS — Z125 Encounter for screening for malignant neoplasm of prostate: Secondary | ICD-10-CM

## 2012-11-19 LAB — HEMOGLOBIN A1C: Hgb A1c MFr Bld: 10 % — ABNORMAL HIGH (ref 4.6–6.5)

## 2012-11-19 LAB — COMPREHENSIVE METABOLIC PANEL WITH GFR
ALT: 29 U/L (ref 0–53)
AST: 22 U/L (ref 0–37)
Albumin: 3.9 g/dL (ref 3.5–5.2)
Alkaline Phosphatase: 50 U/L (ref 39–117)
BUN: 15 mg/dL (ref 6–23)
CO2: 28 meq/L (ref 19–32)
Calcium: 9.1 mg/dL (ref 8.4–10.5)
Chloride: 104 meq/L (ref 96–112)
Creatinine, Ser: 1.2 mg/dL (ref 0.4–1.5)
GFR: 84.17 mL/min
Glucose, Bld: 158 mg/dL — ABNORMAL HIGH (ref 70–99)
Potassium: 4.4 meq/L (ref 3.5–5.1)
Sodium: 139 meq/L (ref 135–145)
Total Bilirubin: 0.6 mg/dL (ref 0.3–1.2)
Total Protein: 6.9 g/dL (ref 6.0–8.3)

## 2012-11-19 LAB — LIPID PANEL
HDL: 46.6 mg/dL (ref >39.00)
Triglycerides: 57 mg/dL (ref 0.0–149.0)

## 2012-11-19 LAB — LDL CHOLESTEROL, DIRECT: Direct LDL: 170.2 mg/dL

## 2012-11-20 NOTE — Assessment & Plan Note (Signed)
Lab Results  Component Value Date   HGBA1C 10.0* 11/19/2012   Diabetes is poorly controlled  Plan Follow up OV to discuss modification of treatment regimen.

## 2012-11-20 NOTE — Assessment & Plan Note (Signed)
Interval history is unremarkable. Physical exam is normal. Lab results - poor control of diabetes and lipids. He is current with  Colorectal and prostate cancer screening. Immunization is up to date.  In summary   a very nice man who has poor control of metabolic disease: diabetes, hyperlipidemia, hypertension. He will return soon for change in management.

## 2012-11-20 NOTE — Assessment & Plan Note (Signed)
Lab reveals LDL of 170, way above goal of 100 or less  Plan  ROV to discuss treatment options.

## 2012-11-20 NOTE — Assessment & Plan Note (Signed)
BP Readings from Last 3 Encounters:  11/18/12 148/82  10/27/12 152/100  11/04/11 184/102   Poor control.  Plan  ROV for modification of medication.

## 2012-11-24 ENCOUNTER — Telehealth: Payer: Self-pay | Admitting: Internal Medicine

## 2012-11-24 NOTE — Telephone Encounter (Signed)
Message copied by Richrd Sox on Wed Nov 24, 2012  3:54 PM ------      Message from: Neena Rhymes      Created: Sat Nov 20, 2012  1:53 PM       2 week follow up - management of DM, lipids, HTN.            Thanks ------

## 2012-11-24 NOTE — Telephone Encounter (Signed)
Tried calling pt, lvmom

## 2012-12-28 ENCOUNTER — Encounter: Payer: Self-pay | Admitting: Internal Medicine

## 2013-02-13 ENCOUNTER — Other Ambulatory Visit: Payer: Self-pay | Admitting: Internal Medicine

## 2013-03-01 ENCOUNTER — Ambulatory Visit (AMBULATORY_SURGERY_CENTER): Payer: Self-pay

## 2013-03-01 VITALS — Ht 77.0 in | Wt 311.0 lb

## 2013-03-01 DIAGNOSIS — Z1211 Encounter for screening for malignant neoplasm of colon: Secondary | ICD-10-CM

## 2013-03-01 MED ORDER — MOVIPREP 100 G PO SOLR: ORAL | Status: DC

## 2013-03-14 ENCOUNTER — Encounter: Payer: Self-pay | Admitting: Internal Medicine

## 2013-03-15 ENCOUNTER — Ambulatory Visit (AMBULATORY_SURGERY_CENTER): Payer: BC Managed Care – PPO | Admitting: Internal Medicine

## 2013-03-15 ENCOUNTER — Encounter: Payer: Self-pay | Admitting: Internal Medicine

## 2013-03-15 VITALS — BP 130/96 | HR 60 | Temp 97.7°F | Resp 15 | Ht 77.0 in | Wt 311.0 lb

## 2013-03-15 DIAGNOSIS — Z1211 Encounter for screening for malignant neoplasm of colon: Secondary | ICD-10-CM

## 2013-03-15 DIAGNOSIS — D126 Benign neoplasm of colon, unspecified: Secondary | ICD-10-CM

## 2013-03-15 MED ORDER — SODIUM CHLORIDE 0.9 % IV SOLN: 500.0000 mL | INTRAVENOUS | Status: DC

## 2013-03-15 NOTE — Patient Instructions (Signed)
1 polyp removed and sent to pathology.  Otherwise normal.  Await pathology for final recommendation.  YOU HAD AN ENDOSCOPIC PROCEDURE TODAY AT [facility]: Refer to the procedure report that was given to you for any specific questions about what was found during the examination.  If the procedure report does not answer your questions, please call your gastroenterologist to clarify.  If you requested that your care partner not be given the details of your procedure findings, then the procedure report has been included in a sealed envelope for you to review at your convenience later.  YOU SHOULD EXPECT: Some feelings of bloating in the abdomen. Passage of more gas than usual.  Walking can help get rid of the air that was put into your GI tract during the procedure and reduce the bloating. If you had a lower endoscopy (such as a colonoscopy or flexible sigmoidoscopy) you may notice spotting of blood in your stool or on the toilet paper. If you underwent a bowel prep for your procedure, then you may not have a normal bowel movement for a few days.  DIET: Your first meal following the procedure should be a light meal and then it is ok to progress to your normal diet.  A half-sandwich or bowl of soup is an example of a good first meal.  Heavy or fried foods are harder to digest and may make you feel nauseous or bloated.  Likewise meals heavy in dairy and vegetables can cause extra gas to form and this can also increase the bloating.  Drink plenty of fluids but you should avoid alcoholic beverages for 24 hours.  ACTIVITY: Your care partner should take you home directly after the procedure.  You should plan to take it easy, moving slowly for the rest of the day.  You can resume normal activity the day after the procedure however you should NOT DRIVE or use heavy machinery for 24 hours (because of the sedation medicines used during the test).    SYMPTOMS TO REPORT IMMEDIATELY: A gastroenterologist  can be reached at any hour.  During normal business hours, 8:30 AM to 5:00 PM Monday through Friday, call [phone].  After hours and on weekends, please call the GI answering service at [phone] who will take a message and have the physician on call contact you.   Following lower endoscopy (colonoscopy or flexible sigmoidoscopy):  Excessive amounts of blood in the stool  Significant tenderness or worsening of abdominal pains  Swelling of the abdomen that is new, acute  Fever of 100F or higher  FOLLOW UP: If any biopsies were taken you will be contacted by phone or by letter within the next 1-3 weeks.  Call your gastroenterologist if you have not heard about the biopsies in 3 weeks.  Our staff will call the home number listed on your records the next business day following your procedure to check on you and address any questions or concerns that you may have at that time regarding the information given to you following your procedure. This is a courtesy call and so if there is no answer at the home number and we have not heard from you through the emergency physician on call, we will assume that you have returned to your regular daily activities without incident.  SIGNATURES/CONFIDENTIALITY: You and/or your care partner have signed paperwork which will be entered into your electronic medical record.  These signatures attest to the fact that that the information above on your  After Visit Summary has been reviewed and is understood.  Full responsibility of the confidentiality of this discharge information lies with you and/or your care-partner.

## 2013-03-15 NOTE — Progress Notes (Signed)
Report to pacu rn, vss, bbs=clear 

## 2013-03-15 NOTE — Progress Notes (Signed)
Patient did not experience any of the following events: a burn prior to discharge; a fall within the facility; wrong site/side/patient/procedure/implant event; or a hospital transfer or hospital admission upon discharge from the facility. (G8907) Patient did not have preoperative order for IV antibiotic SSI prophylaxis. (G8918)  

## 2013-03-15 NOTE — Progress Notes (Signed)
Called to room to assist during endoscopic procedure.  Patient ID and intended procedure confirmed with present staff. Received instructions for my participation in the procedure from the performing physician.  

## 2013-03-15 NOTE — Op Note (Signed)
[place]  [address]. [place], [address]   COLONOSCOPY PROCEDURE REPORT  PATIENT: Johnny [name], Johnny [name]  MR#: [id number] BIRTHDATE: 04/26/1956 , 56  yrs. old GENDER: Male ENDOSCOPIST: Jerene Bears, MD REFERRED [id number] Marquis Lunch, M.D. PROCEDURE DATE:  03/15/2013 PROCEDURE:   Colonoscopy with cold biopsy polypectomy First Screening Colonoscopy - Avg.  risk and is 50 yrs.  old or older Yes.  Prior Negative Screening - Now for repeat screening. N/A  History of Adenoma - Now for follow-up colonoscopy & has been > or = to 3 yrs.  N/A  Polyps Removed Today? Yes. ASA CLASS:   Class III INDICATIONS:average risk screening and first colonoscopy. MEDICATIONS: MAC sedation, administered by CRNA and propofol (Diprivan) 400mg  IV  DESCRIPTION OF PROCEDURE:   After the risks benefits and alternatives of the procedure were thoroughly explained, informed consent was obtained.  A digital rectal exam revealed no rectal mass.   The LB [id number]  endoscope was introduced through the anus and advanced to the cecum, which was identified by both the appendix and ileocecal valve. No adverse events experienced. The quality of the prep was good, using MoviPrep  The instrument was then slowly withdrawn as the colon was fully examined.   COLON FINDINGS: Lipomatous IC valve.  A sessile polyp measuring 3 mm in size was found at the splenic flexure.  A polypectomy was performed with cold forceps.  The resection was complete and the polyp tissue was completely retrieved.   The colon mucosa was otherwise normal.  Retroflexed views revealed external hemorrhoids. The time to cecum=5 minutes 53 seconds.  Withdrawal time=11 minutes 57 seconds.  The scope was withdrawn and the procedure completed. COMPLICATIONS: There were no complications.  ENDOSCOPIC IMPRESSION: 1.   Sessile polyp measuring 3 mm in size was found at the splenic flexure; polypectomy was performed with cold forceps 2.    The colon mucosa was otherwise normal  RECOMMENDATIONS: 1.  Await pathology results 2.  If the polyp removed today is proven to be an adenomatous (pre-cancerous) polyp, you will need a repeat colonoscopy in 5 years.  Otherwise you should continue to follow colorectal cancer screening guidelines for "routine risk" patients with colonoscopy in 10 years.  You will receive a letter within 1-2 weeks with the results of your biopsy as well as final recommendations.  Please call my office if you have not received a letter after 3 weeks.   eSigned:  Jerene Bears, MD 03/15/2013 12:18 PM  cc: The Patient and Neena Rhymes, MD

## 2013-03-16 ENCOUNTER — Telehealth: Payer: Self-pay | Admitting: *Deleted

## 2013-03-16 NOTE — Telephone Encounter (Signed)
  Follow up Call-  Call back number 03/15/2013  Post procedure Call Back phone  # [phone]  Permission to leave phone message Yes     Patient questions:  Do you have a fever, pain , or abdominal swelling? no Pain Score  0 *  Have you tolerated food without any problems? yes  Have you been able to return to your normal activities? yes  Do you have any questions about your discharge instructions: Diet   no Medications  no Follow up visit  no  Do you have questions or concerns about your Care? no  Actions: * If pain score is 4 or above: No action needed, pain <4.

## 2013-03-21 ENCOUNTER — Encounter: Payer: Self-pay | Admitting: Internal Medicine

## 2013-03-23 ENCOUNTER — Other Ambulatory Visit: Payer: Self-pay | Admitting: Internal Medicine

## 2013-08-16 NOTE — Telephone Encounter (Signed)
error 

## 2013-10-28 ENCOUNTER — Telehealth: Payer: Self-pay

## 2013-10-28 MED ORDER — METFORMIN HCL 500 MG PO TABS: 500.0000 mg | ORAL_TABLET | Freq: Every day | ORAL | Status: DC

## 2013-10-28 NOTE — Telephone Encounter (Signed)
Refill done.  

## 2013-11-03 ENCOUNTER — Telehealth: Payer: Self-pay | Admitting: *Deleted

## 2013-11-03 DIAGNOSIS — E119 Type 2 diabetes mellitus without complications: Secondary | ICD-10-CM

## 2013-11-03 NOTE — Telephone Encounter (Signed)
Left message on machine for patient to schedule a follow up diabetic appointment.  A1c, bmet, lipid ordered.

## 2013-11-23 ENCOUNTER — Other Ambulatory Visit (INDEPENDENT_AMBULATORY_CARE_PROVIDER_SITE_OTHER): Payer: BC Managed Care – PPO

## 2013-11-23 DIAGNOSIS — E119 Type 2 diabetes mellitus without complications: Secondary | ICD-10-CM

## 2013-11-23 LAB — HEMOGLOBIN A1C: HEMOGLOBIN A1C: 10.8 % — AB (ref 4.6–6.5)

## 2013-11-23 LAB — BASIC METABOLIC PANEL
BUN: 25 mg/dL — AB (ref 6–23)
CHLORIDE: 100 meq/L (ref 96–112)
CO2: 30 mEq/L (ref 19–32)
Calcium: 9.2 mg/dL (ref 8.4–10.5)
Creatinine, Ser: 2 mg/dL — ABNORMAL HIGH (ref 0.4–1.5)
GFR: 44.21 mL/min — ABNORMAL LOW (ref >60.00)
Glucose, Bld: 137 mg/dL — ABNORMAL HIGH (ref 70–99)
POTASSIUM: 3.7 meq/L (ref 3.5–5.1)
SODIUM: 139 meq/L (ref 135–145)

## 2013-11-23 LAB — LIPID PANEL
Cholesterol: 200 mg/dL (ref 0–200)
HDL: 39 mg/dL — ABNORMAL LOW (ref >39.00)
LDL Cholesterol: 148 mg/dL — ABNORMAL HIGH (ref 0–99)
NONHDL: 161
Total CHOL/HDL Ratio: 5
Triglycerides: 66 mg/dL (ref 0.0–149.0)
VLDL: 13.2 mg/dL (ref 0.0–40.0)

## 2013-11-25 ENCOUNTER — Encounter: Payer: Self-pay | Admitting: Internal Medicine

## 2013-11-25 ENCOUNTER — Ambulatory Visit (INDEPENDENT_AMBULATORY_CARE_PROVIDER_SITE_OTHER): Payer: BC Managed Care – PPO | Admitting: Internal Medicine

## 2013-11-25 VITALS — BP 108/68 | HR 80 | Temp 98.2°F | Wt 292.8 lb

## 2013-11-25 DIAGNOSIS — E1129 Type 2 diabetes mellitus with other diabetic kidney complication: Secondary | ICD-10-CM

## 2013-11-25 DIAGNOSIS — I10 Essential (primary) hypertension: Secondary | ICD-10-CM

## 2013-11-25 DIAGNOSIS — IMO0001 Reserved for inherently not codable concepts without codable children: Secondary | ICD-10-CM

## 2013-11-25 DIAGNOSIS — E785 Hyperlipidemia, unspecified: Secondary | ICD-10-CM

## 2013-11-25 DIAGNOSIS — E1165 Type 2 diabetes mellitus with hyperglycemia: Principal | ICD-10-CM

## 2013-11-25 MED ORDER — GLIMEPIRIDE 2 MG PO TABS
2.0000 mg | ORAL_TABLET | Freq: Every day | ORAL | Status: DC
Start: 2013-11-25 — End: 2014-06-29

## 2013-11-25 NOTE — Progress Notes (Signed)
Pre visit review using our clinic review tool, if applicable. No additional management support is needed unless otherwise documented below in the visit note. 

## 2013-11-25 NOTE — Progress Notes (Signed)
   Subjective:    Patient ID: Johnny [name], male    DOB: [birth year], 58 y.o.   MRN: [id number]  HPI He is here to address DM,HTN & dyslipidemia  Labs completed 11/23/13 revealed a hemoglobin A1c of 10.8% ( average sugar 296 & risk of 116%). LDL  148, HDL 39 triglycerides 68. Renal insufficiency was present with a creatinine of 2.0 ,BUN of 25, and GFR 44.21.  He does not monitor blood sugars at home as he does not have a glucometer.  1 month ago he started an exercise and nutrition plan. He exercises roughly 30 minutes every day as bike riding, elliptical machine and running.  He's increased his vegetables and fruits in his diet and is eating baked chicken or fish with vegetables @ his main meals. He is avoiding fried foods. He also is restricting salt  He is presently on metformin once daily.     Review of Systems   Mild numbness/tingling in feet chronically.  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 Slightly decreased sensation at the right heel with monofilament testing.  General appearance is one of good health and nourishment w/o distress. Eyes: No conjunctival inflammation or scleral icterus is present. Oral exam: Dental hygiene is good; lips and gums are healthy appearing.There is no oropharyngeal erythema or exudate noted.  Heart:  Normal rate and regular rhythm. S1 and S2 normal without gallop, murmur, click, rub or other extra sounds   Lungs:Chest clear to auscultation; no wheezes, rhonchi,rales ,or rubs present.No increased work of breathing.  Abdomen: bowel sounds normal, soft and non-tender without masses, organomegaly or hernias noted.  No guarding  or rebound . No tenderness over the flanks to percussion Musculoskeletal: Able to lie flat and sit up without help. Negative straight leg raising bilaterally. Gait normal Skin:Warm & dry.  Lymphatic: No lymphadenopathy is noted about the head, neck, axilla.                Assessment & Plan:  See Current Assessment & Plan in Problem List under specific Diagnosis

## 2013-11-25 NOTE — Assessment & Plan Note (Signed)
TLC discussed Labs 3 months

## 2013-11-25 NOTE — Assessment & Plan Note (Signed)
Continue low dose Metformin Add Amaryl BMET, A1c 3 mos

## 2013-11-25 NOTE — Assessment & Plan Note (Signed)
Decrease ARB/HCT to 1/2 qd if BP remains < 140/90 Repeat labs 3 mos

## 2013-11-25 NOTE — Progress Notes (Signed)
Subjective:    Patient ID: Johnny [name], male    DOB: Sep 03, 1955, 58 y.o.   MRN: [id number]  HPI Pt's lipid panel and DM checked on 11/23/13: HbA1c=10.8, Ur microalbumin none in past 5 years; Total cholesterol=200; LDL=148; HDL=39; TG=66; Cr=2.0; BUN=25  Pt's eye exam was in 08/2013 with no changes per pt. Pt's last foot exam was 11/2012.   Pt is not monitoring his BS at home. He is without a glucometer. No symptomatic hypoglycemia reported. One month ago patient started an exercise and diet plan. Pt is exercising roughly 30 mintues everyday. He is bike riding, elliptical, and running. The pt is eating vegetable or fruit smoothies for breakfast and lunch. At dinner he is eating grilled or baked chicken or fish with vegetables. He is not eating fried foods nor adding table salt to his meals.                                                 Pt takes metformin 500mg  daily with no side effects.    Review of Systems No excess thirst, excess hunger, or excess urination reported                              No lightheadedness with standing reported No chest pain ; palpitations ; claudication described .                                                                                                                             No non healing skin ulcers or sores of extremities noted. Chronic mild numbness and tingling in feet described.                                                                                                                                             No significant change in weight of pound gain pound loss. 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 CKD; stop metformin, consider sulfonyurea. Check urine microalb     #2 T2DM with kidney manifestations;

## 2013-11-25 NOTE — Patient Instructions (Addendum)
Eat a low-fat diet with lots of fruits and vegetables, up to 7-9 servings per day. Consume less than 40 30  Grams (preferably ZERO) of sugar per day from foods & drinks with High Fructose Corn Syrup (HFCS) sugar as #1,2,3 or # 4 on label.[employer], [employer] do not carry products with HFCS. Follow a  low carb nutrition program such as [place] or The [employer] Sugar [employer]  to prevent Diabetes progression . White carbohydrates (potatoes, rice, bread, and pasta) have a high spike of sugar and a high load of sugar. For example a  baked potato has a cup of sugar and a  french fry  2 teaspoons of sugar. Yams, wild  rice, whole grained bread &  wheat pasta have been much lower spike and load of  sugar. Portions should be the size of a deck of cards or your palm. Minimal Blood Pressure Goal= AVERAGE < 140/90;  Ideal is an AVERAGE < 135/85. This AVERAGE should be calculated from @ least 5-7 BP readings taken @ different times of day on different days of week. You should not respond to isolated BP readings , but rather the AVERAGE for that week .Please bring your  blood pressure cuff to office visits to verify that it is reliable.It  can also be checked against the blood pressure device at the pharmacy. Finger or wrist cuffs are not dependable; an arm cuff is.  BUN, creatinine, and GFR  all assess kidney function. To protect the kidneys it  is important to control your blood pressure and sugar. You should also stay well hydrated. Drink to thirst, up to 32 ounces of fluids per day.  Progressive kidney impairment is typically associated with anemia which is unrelated to  iron deficiency or B12 deficiency.  Repeat fasting labs in 12 weeks (orders entered)

## 2014-03-23 ENCOUNTER — Ambulatory Visit (INDEPENDENT_AMBULATORY_CARE_PROVIDER_SITE_OTHER): Payer: BC Managed Care – PPO | Admitting: Internal Medicine

## 2014-03-23 ENCOUNTER — Encounter: Payer: Self-pay | Admitting: Internal Medicine

## 2014-03-23 VITALS — BP 148/90 | HR 69 | Temp 98.9°F | Wt 286.0 lb

## 2014-03-23 DIAGNOSIS — J069 Acute upper respiratory infection, unspecified: Secondary | ICD-10-CM

## 2014-03-23 DIAGNOSIS — J208 Acute bronchitis due to other specified organisms: Secondary | ICD-10-CM

## 2014-03-23 MED ORDER — AMOXICILLIN 500 MG PO CAPS: 500.0000 mg | ORAL_CAPSULE | Freq: Three times a day (TID) | ORAL | Status: DC

## 2014-03-23 MED ORDER — HYDROCODONE-HOMATROPINE 5-1.5 MG/5ML PO SYRP: 5.0000 mL | ORAL_SOLUTION | Freq: Four times a day (QID) | ORAL | Status: DC | PRN

## 2014-03-23 NOTE — Progress Notes (Signed)
Pre visit review using our clinic review tool, if applicable. No additional management support is needed unless otherwise documented below in the visit note. 

## 2014-03-23 NOTE — Patient Instructions (Signed)

## 2014-03-23 NOTE — Progress Notes (Signed)
   Subjective:    Patient ID: Johnny [name], male    DOB: 06-20-1955, 58 y.o.   MRN: [id number]  HPI   His symptoms began 11/12 as head congestion with discolored nasal secretions. This persisted until 11/15.  He felt better 11/16-18. He did have some hot flashes with change in respiratory rate or with laughing.  As of 11/19 he has had sore throat. The cough is paroxysmal, productive of green sputum.  He has more secretions from the chest than the head at this time  He's had watery eyes but no other extrinsic symptoms  He has some shortness of breath with the cough.  He does not take flu shot. He does not smoke.   Review of Systems Frontal headache, facial pain , nasal purulence, dental pain, sore throat , otic pain or otic discharge denied. No fever , chills or sweats.     Objective:   Physical Exam  Positive or pertinent findings include: Head is shaven. Nares are markedly boggy.  General appearance:good health ;well nourished; no acute distress or increased work of breathing is present.  No  lymphadenopathy about the head, neck, or axilla noted.  Eyes: No conjunctival inflammation or lid edema is present. There is no scleral icterus. Ears:  External ear exam shows no significant lesions or deformities.  Otoscopic examination reveals clear canals, tympanic membranes are intact bilaterally without bulging, retraction, inflammation or discharge. Nose:  External nasal examination shows no deformity or inflammation. No septal dislocation or deviation.No obstruction to airflow.  Oral exam: Dental hygiene is good; lips and gums are healthy appearing.There is no oropharyngeal erythema or exudate noted.  Neck:  No deformities, thyromegaly, masses, or tenderness noted.   Supple with full range of motion without pain.  Heart:  Normal rate and regular rhythm. S1 and S2 normal without gallop, murmur, click, rub or other extra sounds.  Lungs:Chest clear to auscultation; no wheezes,  rhonchi,rales ,or rubs present.No increased work of breathing.   Extremities:  No cyanosis, edema, or clubbing  noted  Skin: Warm & dry tenting.        Assessment & Plan:  #1 acute bronchitis w/o bronchospasm #2 URI, acute Plan: See orders and recommendations

## 2014-06-29 ENCOUNTER — Other Ambulatory Visit: Payer: Self-pay | Admitting: Geriatric Medicine

## 2014-06-29 ENCOUNTER — Telehealth: Payer: Self-pay | Admitting: Internal Medicine

## 2014-06-29 DIAGNOSIS — E119 Type 2 diabetes mellitus without complications: Secondary | ICD-10-CM

## 2014-06-29 DIAGNOSIS — I10 Essential (primary) hypertension: Secondary | ICD-10-CM

## 2014-06-29 MED ORDER — OLMESARTAN MEDOXOMIL-HCTZ 40-12.5 MG PO TABS: ORAL_TABLET | ORAL | Status: DC

## 2014-06-29 MED ORDER — GLIMEPIRIDE 2 MG PO TABS: 2.0000 mg | ORAL_TABLET | Freq: Every day | ORAL | Status: DC

## 2014-06-29 MED ORDER — METFORMIN HCL 500 MG PO TABS: 500.0000 mg | ORAL_TABLET | Freq: Every day | ORAL | Status: DC

## 2014-06-29 NOTE — Telephone Encounter (Signed)
Patient has an appt to transfer over from [place] to [facility] on 3/11.  He is requesting refills of benicar and a med for his diabetes until he comes in for that appointment.  Patient uses [facility] on [address].

## 2014-06-29 NOTE — Telephone Encounter (Signed)
Sent to pharmacy 

## 2014-07-12 ENCOUNTER — Other Ambulatory Visit (INDEPENDENT_AMBULATORY_CARE_PROVIDER_SITE_OTHER): Payer: Self-pay

## 2014-07-12 ENCOUNTER — Other Ambulatory Visit: Payer: Self-pay | Admitting: *Deleted

## 2014-07-12 DIAGNOSIS — E785 Hyperlipidemia, unspecified: Secondary | ICD-10-CM

## 2014-07-12 DIAGNOSIS — E1165 Type 2 diabetes mellitus with hyperglycemia: Principal | ICD-10-CM

## 2014-07-12 DIAGNOSIS — IMO0002 Reserved for concepts with insufficient information to code with codable children: Secondary | ICD-10-CM

## 2014-07-12 DIAGNOSIS — E1129 Type 2 diabetes mellitus with other diabetic kidney complication: Secondary | ICD-10-CM

## 2014-07-12 LAB — LIPID PANEL
Cholesterol: 194 mg/dL (ref 0–200)
HDL: 39.7 mg/dL (ref >39.00)
LDL Cholesterol: 140 mg/dL — ABNORMAL HIGH (ref 0–99)
NonHDL: 154.3
Total CHOL/HDL Ratio: 5
Triglycerides: 73 mg/dL (ref 0.0–149.0)
VLDL: 14.6 mg/dL (ref 0.0–40.0)

## 2014-07-12 LAB — HEMOGLOBIN A1C: Hgb A1c MFr Bld: 9.6 % — ABNORMAL HIGH (ref 4.6–6.5)

## 2014-07-14 ENCOUNTER — Other Ambulatory Visit (INDEPENDENT_AMBULATORY_CARE_PROVIDER_SITE_OTHER): Payer: BLUE CROSS/BLUE SHIELD

## 2014-07-14 ENCOUNTER — Encounter: Payer: Self-pay | Admitting: Internal Medicine

## 2014-07-14 ENCOUNTER — Ambulatory Visit (INDEPENDENT_AMBULATORY_CARE_PROVIDER_SITE_OTHER): Payer: Self-pay | Admitting: Internal Medicine

## 2014-07-14 VITALS — BP 164/98 | HR 70 | Temp 98.6°F | Resp 16 | Ht 77.0 in | Wt 301.0 lb

## 2014-07-14 DIAGNOSIS — E1129 Type 2 diabetes mellitus with other diabetic kidney complication: Secondary | ICD-10-CM

## 2014-07-14 DIAGNOSIS — IMO0002 Reserved for concepts with insufficient information to code with codable children: Secondary | ICD-10-CM

## 2014-07-14 DIAGNOSIS — E1121 Type 2 diabetes mellitus with diabetic nephropathy: Secondary | ICD-10-CM

## 2014-07-14 DIAGNOSIS — E1165 Type 2 diabetes mellitus with hyperglycemia: Secondary | ICD-10-CM

## 2014-07-14 DIAGNOSIS — I10 Essential (primary) hypertension: Secondary | ICD-10-CM

## 2014-07-14 DIAGNOSIS — E785 Hyperlipidemia, unspecified: Secondary | ICD-10-CM

## 2014-07-14 LAB — BASIC METABOLIC PANEL
BUN: 12 mg/dL (ref 6–23)
CHLORIDE: 104 meq/L (ref 96–112)
CO2: 32 meq/L (ref 19–32)
Calcium: 9.5 mg/dL (ref 8.4–10.5)
Creatinine, Ser: 1.37 mg/dL (ref 0.40–1.50)
GFR: 69.05 mL/min (ref >60.00)
GLUCOSE: 118 mg/dL — AB (ref 70–99)
Potassium: 4.4 mEq/L (ref 3.5–5.1)
Sodium: 140 mEq/L (ref 135–145)

## 2014-07-14 LAB — MICROALBUMIN / CREATININE URINE RATIO
Creatinine,U: 186.6 mg/dL
Microalb Creat Ratio: 0.4 mg/g (ref 0.0–30.0)
Microalb, Ur: 0.8 mg/dL (ref 0.0–1.9)

## 2014-07-14 NOTE — Patient Instructions (Signed)
We will have you go down to the lab to check the kidneys for the diabetes. Based on that test we may have to switch some of the medicines for the diabetes.   The best thing you can do for your health and your kidneys is to work on losing weight with diet and exercise to help with the diabetes.   The last time we checked the blood in July the kidneys were showing some early signs of being affected by the diabetes so we'll see how they are doing now.  I would like to see you back in about 3-4 months to check on the weight and the diabetes because we really need to get this under control now to prevent you from having problems such as permanent kidney problems and numbness in the feet.  Diabetes and Exercise Exercising regularly is important. It is not just about losing weight. It has many health benefits, such as:  Improving your overall fitness, flexibility, and endurance.  Increasing your bone density.  Helping with weight control.  Decreasing your body fat.  Increasing your muscle strength.  Reducing stress and tension.  Improving your overall health. People with diabetes who exercise gain additional benefits because exercise:  Reduces appetite.  Improves the body's use of blood sugar (glucose).  Helps lower or control blood glucose.  Decreases blood pressure.  Helps control blood lipids (such as cholesterol and triglycerides).  Improves the body's use of the hormone insulin by:  Increasing the body's insulin sensitivity.  Reducing the body's insulin needs.  Decreases the risk for heart disease because exercising:  Lowers cholesterol and triglycerides levels.  Increases the levels of good cholesterol (such as high-density lipoproteins [HDL]) in the body.  Lowers blood glucose levels. YOUR ACTIVITY PLAN  Choose an activity that you enjoy and set realistic goals. Your health care provider or diabetes educator can help you make an activity plan that works for you.  Exercise regularly as directed by your health care provider. This includes:  Performing resistance training twice a week such as push-ups, sit-ups, lifting weights, or using resistance bands.  Performing 150 minutes of cardio exercises each week such as walking, running, or playing sports.  Staying active and spending no more than 90 minutes at one time being inactive. Even short bursts of exercise are good for you. Three 10-minute sessions spread throughout the day are just as beneficial as a single 30-minute session. Some exercise ideas include:  Taking the dog for a walk.  Taking the stairs instead of the elevator.  Dancing to your favorite song.  Doing an exercise video.  Doing your favorite exercise with a friend. RECOMMENDATIONS FOR EXERCISING WITH TYPE 1 OR TYPE 2 DIABETES   Check your blood glucose before exercising. If blood glucose levels are greater than 240 mg/dL, check for urine ketones. Do not exercise if ketones are present.  Avoid injecting insulin into areas of the body that are going to be exercised. For example, avoid injecting insulin into:  The arms when playing tennis.  The legs when jogging.  Keep a record of:  Food intake before and after you exercise.  Expected peak times of insulin action.  Blood glucose levels before and after you exercise.  The type and amount of exercise you have done.  Review your records with your health care provider. Your health care provider will help you to develop guidelines for adjusting food intake and insulin amounts before and after exercising.  If you take insulin or oral  hypoglycemic agents, watch for signs and symptoms of hypoglycemia. They include:  Dizziness.  Shaking.  Sweating.  Chills.  Confusion.  Drink plenty of water while you exercise to prevent dehydration or heat stroke. Body water is lost during exercise and must be replaced.  Talk to your health care provider before starting an exercise  program to make sure it is safe for you. Remember, almost any type of activity is better than none. Document Released: 07/12/2003 Document Revised: 09/05/2013 Document Reviewed: 09/28/2012 [facility] Patient Information 2015 [place], [place]. This information is not intended to replace advice given to you by your health care provider. Make sure you discuss any questions you have with your health care provider.

## 2014-07-14 NOTE — Progress Notes (Signed)
Pre visit review using our clinic review tool, if applicable. No additional management support is needed unless otherwise documented below in the visit note. 

## 2014-07-16 NOTE — Assessment & Plan Note (Signed)
He is on metformin for his diabetes although that may need to be stopped. Most recent creatinine was 2.0 (and never followed up) which was significantly different from his previous. Hopefully on recheck today it will be back to his normal and we can continue as he is not having any problems (will likely increase dosage). He is also on sulfonylurea and may need to add additional agent. His last HgA1c was 9.6 (improved from prior but not good). Spent >5 minutes today talking to him about the risks of uncontrolled sugars including kidney damage and numbness and heart attack and stroke. He is willing to make some changes to get his sugars controlled. Foot exam done today.

## 2014-07-16 NOTE — Progress Notes (Signed)
   Subjective:    Patient ID: Johnny [name], male    DOB: October 30, 1955, 59 y.o.   MRN: [id number]  HPI The patient is a 59 YO man who is coming in to follow up on his diabetes. He has struggled with it for many years. He has been stable on his medicines for the last several years however his HgA1c have not been good. He has not been seen for this in about 6-12 months. He does not exercise much at home. He feels his diet is good as his wife is into nutrition and she is getting him into eating better. He has decreased his carbohydrates. He does have complicated diabetes with CKD although he was not aware of that. Denies numbness, tingling, chest pains.   Review of Systems  Constitutional: Negative for fever, activity change, appetite change, fatigue and unexpected weight change.  HENT: Negative.   Eyes: Negative.   Respiratory: Negative for cough, chest tightness, shortness of breath and wheezing.   Cardiovascular: Negative for chest pain, palpitations and leg swelling.  Gastrointestinal: Negative for diarrhea, constipation and abdominal distention.  Musculoskeletal: Negative.   Skin: Negative.   Neurological: Negative.   Psychiatric/Behavioral: Negative.       Objective:   Physical Exam  Constitutional: He is oriented to person, place, and time. He appears well-developed and well-nourished.  HENT:  Head: Normocephalic and atraumatic.  Eyes: EOM are normal.  Neck: Normal range of motion.  Cardiovascular: Normal rate and regular rhythm.   Pulmonary/Chest: Effort normal and breath sounds normal. No respiratory distress. He has no wheezes.  Abdominal: Soft. Bowel sounds are normal.  Musculoskeletal: He exhibits no edema.  Neurological: He is alert and oriented to person, place, and time.  Skin: Skin is warm and dry.  See foot exam  Psychiatric: He has a normal mood and affect.   Filed Vitals:   07/14/14 1429  BP: 164/98  Pulse: 70  Temp: 98.6 F (37 C)  TempSrc: Oral  Resp: 16    Height: 6\' 5"  (1.956 m)  Weight: 301 lb (136.533 kg)  SpO2: 97%      Assessment & Plan:

## 2014-07-16 NOTE — Assessment & Plan Note (Signed)
Likely needs statin. Last LDL 140 and not on any agents. He is willing to try diet and exercise and then will think about taking statin if no improvement.

## 2014-07-16 NOTE — Assessment & Plan Note (Signed)
BP reasonably controlled on his current regimen but concern for kidney problems and may need adjustment.

## 2014-07-17 ENCOUNTER — Other Ambulatory Visit: Payer: Self-pay | Admitting: Internal Medicine

## 2014-07-17 MED ORDER — METFORMIN HCL 500 MG PO TABS: 500.0000 mg | ORAL_TABLET | Freq: Every day | ORAL | Status: DC

## 2014-07-17 MED ORDER — CANAGLIFLOZIN 100 MG PO TABS: 100.0000 mg | ORAL_TABLET | Freq: Every day | ORAL | Status: DC

## 2014-07-18 ENCOUNTER — Other Ambulatory Visit: Payer: Self-pay | Admitting: Internal Medicine

## 2014-07-18 MED ORDER — DAPAGLIFLOZIN PROPANEDIOL 5 MG PO TABS: 5.0000 mg | ORAL_TABLET | Freq: Every day | ORAL | Status: DC

## 2014-07-19 ENCOUNTER — Other Ambulatory Visit: Payer: Self-pay | Admitting: Geriatric Medicine

## 2014-07-19 DIAGNOSIS — I10 Essential (primary) hypertension: Secondary | ICD-10-CM

## 2014-07-19 DIAGNOSIS — E119 Type 2 diabetes mellitus without complications: Secondary | ICD-10-CM

## 2014-07-19 MED ORDER — GLIMEPIRIDE 2 MG PO TABS: 2.0000 mg | ORAL_TABLET | Freq: Every day | ORAL | Status: DC

## 2014-07-19 MED ORDER — OLMESARTAN MEDOXOMIL-HCTZ 40-12.5 MG PO TABS: ORAL_TABLET | ORAL | Status: DC

## 2014-07-19 MED ORDER — METFORMIN HCL 500 MG PO TABS: 500.0000 mg | ORAL_TABLET | Freq: Every day | ORAL | Status: DC

## 2014-07-19 NOTE — Telephone Encounter (Signed)
Patient is returning your call.  

## 2014-11-17 ENCOUNTER — Other Ambulatory Visit: Payer: Self-pay | Admitting: Internal Medicine

## 2014-11-17 ENCOUNTER — Ambulatory Visit (INDEPENDENT_AMBULATORY_CARE_PROVIDER_SITE_OTHER): Payer: BLUE CROSS/BLUE SHIELD | Admitting: Internal Medicine

## 2014-11-17 ENCOUNTER — Other Ambulatory Visit (INDEPENDENT_AMBULATORY_CARE_PROVIDER_SITE_OTHER): Payer: BLUE CROSS/BLUE SHIELD

## 2014-11-17 ENCOUNTER — Encounter: Payer: Self-pay | Admitting: Internal Medicine

## 2014-11-17 VITALS — BP 130/70 | HR 74 | Temp 98.4°F | Resp 12 | Ht 77.0 in | Wt 294.1 lb

## 2014-11-17 DIAGNOSIS — I10 Essential (primary) hypertension: Secondary | ICD-10-CM

## 2014-11-17 DIAGNOSIS — E1129 Type 2 diabetes mellitus with other diabetic kidney complication: Secondary | ICD-10-CM

## 2014-11-17 DIAGNOSIS — E1165 Type 2 diabetes mellitus with hyperglycemia: Secondary | ICD-10-CM | POA: Diagnosis not present

## 2014-11-17 DIAGNOSIS — IMO0002 Reserved for concepts with insufficient information to code with codable children: Secondary | ICD-10-CM

## 2014-11-17 LAB — BASIC METABOLIC PANEL
BUN: 17 mg/dL (ref 6–23)
CALCIUM: 9.8 mg/dL (ref 8.4–10.5)
CO2: 29 meq/L (ref 19–32)
CREATININE: 1.72 mg/dL — AB (ref 0.40–1.50)
Chloride: 104 mEq/L (ref 96–112)
GFR: 53.04 mL/min — AB (ref >60.00)
Glucose, Bld: 170 mg/dL — ABNORMAL HIGH (ref 70–99)
Potassium: 4.5 mEq/L (ref 3.5–5.1)
Sodium: 141 mEq/L (ref 135–145)

## 2014-11-17 LAB — HEMOGLOBIN A1C: Hgb A1c MFr Bld: 10.3 % — ABNORMAL HIGH (ref 4.6–6.5)

## 2014-11-17 MED ORDER — METFORMIN HCL 1000 MG PO TABS: 1000.0000 mg | ORAL_TABLET | Freq: Two times a day (BID) | ORAL | Status: DC

## 2014-11-17 NOTE — Progress Notes (Signed)
   Subjective:    Patient ID: Johnny [name], male    DOB: 1955-11-15, 59 y.o.   MRN: [id number]  HPI The patient is a 59 YO man who is coming in for follow up of his diabetes. We added farxiga at last visit and increased his metformin to twice a day. He was not able to make these changes. He stopped taking the metformin and has started taking farxiga. Denies any hypoglycemia since then. Not exercising more than at last visit. Needs eye exam. Up to date on foot exam and pneumonia shot. Is down about 9 pounds since last visit. No new complaints.   Review of Systems  Constitutional: Negative for fever, activity change, appetite change, fatigue and unexpected weight change.  HENT: Negative.   Eyes: Negative.   Respiratory: Negative for cough, chest tightness, shortness of breath and wheezing.   Cardiovascular: Negative for chest pain, palpitations and leg swelling.  Gastrointestinal: Negative for diarrhea, constipation and abdominal distention.  Musculoskeletal: Negative.   Skin: Negative.   Neurological: Negative.   Psychiatric/Behavioral: Negative.       Objective:   Physical Exam  Constitutional: He is oriented to person, place, and time. He appears well-developed and well-nourished.  HENT:  Head: Normocephalic and atraumatic.  Eyes: EOM are normal.  Neck: Normal range of motion.  Cardiovascular: Normal rate and regular rhythm.   Pulmonary/Chest: Effort normal and breath sounds normal. No respiratory distress. He has no wheezes.  Abdominal: Soft.  Musculoskeletal: He exhibits no edema.  Neurological: He is alert and oriented to person, place, and time.  Skin: Skin is warm and dry.   Filed Vitals:   11/17/14 1418  BP: 130/70  Pulse: 74  Temp: 98.4 F (36.9 C)  TempSrc: Oral  Resp: 12  Height: 6\' 5"  (1.956 m)  Weight: 294 lb 1.9 oz (133.412 kg)  SpO2: 98%      Assessment & Plan:

## 2014-11-17 NOTE — Assessment & Plan Note (Addendum)
Last HgA1c 9.6 and he is now down 9 pounds since last visit. This is great and encouraged the weight loss to continue. He is exercising more and taking farxiga and amaryl. Depending on HgA1c and BMP may restart the metformin (he inadvertently stopped taking) as he did not have any side effects with it.

## 2014-11-17 NOTE — Patient Instructions (Signed)
Keep up the great work with the weight loss!  Moving more and being active helps to keep the joints more mobile and with less pain.   Come back in about 4-5 months for a check up. We will check the blood work today.   Diabetes and Exercise Exercising regularly is important. It is not just about losing weight. It has many health benefits, such as:  Improving your overall fitness, flexibility, and endurance.  Increasing your bone density.  Helping with weight control.  Decreasing your body fat.  Increasing your muscle strength.  Reducing stress and tension.  Improving your overall health. People with diabetes who exercise gain additional benefits because exercise:  Reduces appetite.  Improves the body's use of blood sugar (glucose).  Helps lower or control blood glucose.  Decreases blood pressure.  Helps control blood lipids (such as cholesterol and triglycerides).  Improves the body's use of the hormone insulin by:  Increasing the body's insulin sensitivity.  Reducing the body's insulin needs.  Decreases the risk for heart disease because exercising:  Lowers cholesterol and triglycerides levels.  Increases the levels of good cholesterol (such as high-density lipoproteins [HDL]) in the body.  Lowers blood glucose levels. YOUR ACTIVITY PLAN  Choose an activity that you enjoy and set realistic goals. Your health care provider or diabetes educator can help you make an activity plan that works for you. Exercise regularly as directed by your health care provider. This includes:  Performing resistance training twice a week such as push-ups, sit-ups, lifting weights, or using resistance bands.  Performing 150 minutes of cardio exercises each week such as walking, running, or playing sports.  Staying active and spending no more than 90 minutes at one time being inactive. Even short bursts of exercise are good for you. Three 10-minute sessions spread throughout the day are  just as beneficial as a single 30-minute session. Some exercise ideas include:  Taking the dog for a walk.  Taking the stairs instead of the elevator.  Dancing to your favorite song.  Doing an exercise video.  Doing your favorite exercise with a friend. RECOMMENDATIONS FOR EXERCISING WITH TYPE 1 OR TYPE 2 DIABETES   Check your blood glucose before exercising. If blood glucose levels are greater than 240 mg/dL, check for urine ketones. Do not exercise if ketones are present.  Avoid injecting insulin into areas of the body that are going to be exercised. For example, avoid injecting insulin into:  The arms when playing tennis.  The legs when jogging.  Keep a record of:  Food intake before and after you exercise.  Expected peak times of insulin action.  Blood glucose levels before and after you exercise.  The type and amount of exercise you have done.  Review your records with your health care provider. Your health care provider will help you to develop guidelines for adjusting food intake and insulin amounts before and after exercising.  If you take insulin or oral hypoglycemic agents, watch for signs and symptoms of hypoglycemia. They include:  Dizziness.  Shaking.  Sweating.  Chills.  Confusion.  Drink plenty of water while you exercise to prevent dehydration or heat stroke. Body water is lost during exercise and must be replaced.  Talk to your health care provider before starting an exercise program to make sure it is safe for you. Remember, almost any type of activity is better than none. Document Released: 07/12/2003 Document Revised: 09/05/2013 Document Reviewed: 09/28/2012 [facility] Patient Information 2015 [place], [place]. This information is  not intended to replace advice given to you by your health care provider. Make sure you discuss any questions you have with your health care provider.

## 2014-11-17 NOTE — Assessment & Plan Note (Signed)
Doing better with his weight loss. Check BMP today.

## 2014-11-17 NOTE — Progress Notes (Signed)
Pre visit review using our clinic review tool, if applicable. No additional management support is needed unless otherwise documented below in the visit note. 

## 2014-11-20 ENCOUNTER — Telehealth: Payer: Self-pay | Admitting: Internal Medicine

## 2014-11-20 NOTE — Telephone Encounter (Signed)
Patient called you back.  Can you please call him at [phone]

## 2015-10-13 ENCOUNTER — Other Ambulatory Visit: Payer: Self-pay | Admitting: Internal Medicine

## 2015-11-27 ENCOUNTER — Ambulatory Visit (INDEPENDENT_AMBULATORY_CARE_PROVIDER_SITE_OTHER)
Admission: RE | Admit: 2015-11-27 | Discharge: 2015-11-27 | Disposition: A | Discharge status: Home / Self Care | Payer: BLUE CROSS/BLUE SHIELD | Source: Ambulatory Visit | Attending: Family | Admitting: Family

## 2015-11-27 ENCOUNTER — Encounter: Payer: Self-pay | Admitting: Family

## 2015-11-27 ENCOUNTER — Ambulatory Visit (INDEPENDENT_AMBULATORY_CARE_PROVIDER_SITE_OTHER): Payer: BLUE CROSS/BLUE SHIELD | Admitting: Family

## 2015-11-27 DIAGNOSIS — R05 Cough: Secondary | ICD-10-CM | POA: Insufficient documentation

## 2015-11-27 DIAGNOSIS — R059 Cough, unspecified: Secondary | ICD-10-CM

## 2015-11-27 MED ORDER — LEVOFLOXACIN 500 MG PO TABS: 500.0000 mg | ORAL_TABLET | Freq: Every day | ORAL | 0 refills | Status: DC

## 2015-11-27 MED ORDER — HYDROCOD POLST-CPM POLST ER 10-8 MG/5ML PO SUER: 5.0000 mL | Freq: Every evening | ORAL | 0 refills | Status: DC | PRN

## 2015-11-27 NOTE — Progress Notes (Signed)
Subjective:    Patient ID: Johnny [name], male    DOB: 08/14/1955, 60 y.o.   MRN: [id number]  Chief Complaint  Patient presents with  . Cough    x5 days, productive cough, chest and nasal congestion, does have some wheezing and SOB after coughing     HPI:  Johnny [name] is a 60 y.o. male who  has a past medical history of Diabetes mellitus; Guttate psoriasis; HTN (hypertension); and Hyperlipidemia. and presents today for an acute office visit.  This is a new problem. Associated symptom of productive cough, chest and nasal congestion, wheezing, and shortness of breath following coughing and been going on for approximately 5 days. Endorses fevers at home with no measurement. Modifying factors include vinegar and honey. Noted initial improvement and then significant worsening. Denies any recent antibiotics. Severity of the symptoms occasional disturbs his sleep. No other sick contacts noted.    No Known Allergies   Current Outpatient Prescriptions on File Prior to Visit  Medication Sig Dispense Refill  . BENICAR HCT 40-12.5 MG tablet TAKE 1/2-1 TABLET EVERY DAY TO KEEP BP < 140/90 ON AVERAGE 90 tablet 0  . dapagliflozin propanediol (FARXIGA) 5 MG TABS tablet Take 5 mg by mouth daily. 30 tablet 6  . glimepiride (AMARYL) 2 MG tablet Take 1 tablet (2 mg total) by mouth daily before breakfast. 90 tablet 3  . metFORMIN (GLUCOPHAGE) 1000 MG tablet Take 1 tablet (1,000 mg total) by mouth 2 (two) times daily with a meal. 180 tablet 3  . vitamin B-12 (CYANOCOBALAMIN) 1000 MCG tablet Take 1,000 mcg by mouth daily.     No current facility-administered medications on file prior to visit.      Past Surgical History:  Procedure Laterality Date  . TOOTH EXTRACTION      Past Medical History:  Diagnosis Date  . Diabetes mellitus   . Guttate psoriasis   . HTN (hypertension)   . Hyperlipidemia      Review of Systems  Constitutional: Positive for chills, fatigue and fever.  HENT:  Positive for congestion.   Respiratory: Positive for cough, shortness of breath and wheezing.       Objective:    BP (!) 142/88 (BP Location: Left Arm, Patient Position: Sitting, Cuff Size: Large)   Pulse 87   Temp (!) 101 F (38.3 C) (Oral)   Resp 18   Ht 6\' 5"  (1.956 m)   Wt 291 lb (132 kg)   SpO2 96%   BMI 34.51 kg/m  Nursing note and vital signs reviewed.  Physical Exam  Constitutional: He is oriented to person, place, and time. He appears well-developed and well-nourished. No distress.  HENT:  Right Ear: Hearing, tympanic membrane, external ear and ear canal normal.  Left Ear: Hearing, tympanic membrane, external ear and ear canal normal.  Nose: Nose normal.  Mouth/Throat: Uvula is midline, oropharynx is clear and moist and mucous membranes are normal.  Cardiovascular: Normal rate, regular rhythm, normal heart sounds and intact distal pulses.   Pulmonary/Chest: Effort normal. No respiratory distress. He has wheezes. He has rales.  Neurological: He is alert and oriented to person, place, and time.  Skin: Skin is warm and dry.  Psychiatric: He has a normal mood and affect. His behavior is normal. Judgment and thought content normal.       Assessment & Plan:   Problem List Items Addressed This Visit      Other   Cough    Symptoms and exam concerning  for possible pneumonia with possibility of bronchitis. Obtain chest x-ray. Start levofloxacin. Start Tussionex as needed for cough and sleep. Continue over-the-counter medications as needed for symptom relief and supportive care. Follow-up if symptoms worsen or do not improve.      Relevant Medications   chlorpheniramine-HYDROcodone (TUSSIONEX PENNKINETIC ER) 10-8 MG/5ML SUER   levofloxacin (LEVAQUIN) 500 MG tablet   Other Relevant Orders   DG Chest 2 View    Other Visit Diagnoses   None.      I am having Johnny [name] start on chlorpheniramine-HYDROcodone and levofloxacin. I am also having him maintain his vitamin  B-12, dapagliflozin propanediol, glimepiride, metFORMIN, and BENICAR HCT.   Meds ordered this encounter  Medications  . chlorpheniramine-HYDROcodone (TUSSIONEX PENNKINETIC ER) 10-8 MG/5ML SUER    Sig: Take 5 mLs by mouth at bedtime as needed.    Dispense:  115 mL    Refill:  0    Order Specific Question:   Supervising Provider    Answer:   Pricilla Holm A [id number]  . levofloxacin (LEVAQUIN) 500 MG tablet    Sig: Take 1 tablet (500 mg total) by mouth daily.    Dispense:  7 tablet    Refill:  0    Order Specific Question:   Supervising Provider    Answer:   Pricilla Holm A [id number]     Follow-up: Return if symptoms worsen or fail to improve.  Mauricio Po, FNP

## 2015-11-27 NOTE — Patient Instructions (Signed)
Thank you for choosing [facility].  Summary/Instructions:  Please continue to take her medications as prescribed.  Continue to monitor blood sugar as there may be a mild interaction between the glipizide and the levofloxacin.  Your prescription(s) have been submitted to your pharmacy or been printed and provided for you. Please take as directed and contact our office if you believe you are having problem(s) with the medication(s) or have any questions.  Please stop by radiology on the basement level of the building for your x-rays. Your results will be released to [facility] (or called to you) after review, usually within 72 hours after test completion. If any treatments or changes are necessary, you will be notified at that same time.  If your symptoms worsen or fail to improve, please contact our office for further instruction, or in case of emergency go directly to the emergency room at the closest medical facility.   General Recommendations:    Please drink plenty of fluids.  Get plenty of rest   Sleep in humidified air  Use saline nasal sprays  Netti pot   OTC Medications:  Decongestants - helps relieve congestion   Flonase (generic fluticasone) or Nasacort (generic triamcinolone) - please make sure to use the "cross-over" technique at a 45 degree angle towards the opposite eye as opposed to straight up the nasal passageway.   Sudafed (generic pseudoephedrine - Note this is the one that is available behind the pharmacy counter); Products with phenylephrine (-PE) may also be used but is often not as effective as pseudoephedrine.   If you have HIGH BLOOD PRESSURE - Coricidin HBP; AVOID any product that is -D as this contains pseudoephedrine which may increase your blood pressure.  Afrin (oxymetazoline) every 6-8 hours for up to 3 days.   Allergies - helps relieve runny nose, itchy eyes and sneezing   Claritin (generic loratidine), Allegra (fexofenidine), or Zyrtec  (generic cyrterizine) for runny nose. These medications should not cause drowsiness.  Note - Benadryl (generic diphenhydramine) may be used however may cause drowsiness  Cough -   Delsym or Robitussin (generic dextromethorphan)  Expectorants - helps loosen mucus to ease removal   Mucinex (generic guaifenesin) as directed on the package.  Headaches / General Aches   Tylenol (generic acetaminophen) - DO NOT EXCEED 3 grams (3,000 mg) in a 24 hour time period  Advil/Motrin (generic ibuprofen)   Sore Throat -   Salt water gargle   Chloraseptic (generic benzocaine) spray or lozenges / Sucrets (generic dyclonine)

## 2015-11-27 NOTE — Assessment & Plan Note (Addendum)
Symptoms and exam concerning for possible pneumonia with possibility of bronchitis. Obtain chest x-ray. Start levofloxacin. Start Tussionex as needed for cough and sleep. Continue over-the-counter medications as needed for symptom relief and supportive care. Follow-up if symptoms worsen or do not improve.

## 2016-05-08 ENCOUNTER — Other Ambulatory Visit: Payer: Self-pay | Admitting: Internal Medicine

## 2016-05-08 DIAGNOSIS — I10 Essential (primary) hypertension: Secondary | ICD-10-CM

## 2016-06-06 ENCOUNTER — Other Ambulatory Visit (INDEPENDENT_AMBULATORY_CARE_PROVIDER_SITE_OTHER): Payer: BLUE CROSS/BLUE SHIELD

## 2016-06-06 ENCOUNTER — Encounter: Payer: Self-pay | Admitting: Internal Medicine

## 2016-06-06 ENCOUNTER — Ambulatory Visit (INDEPENDENT_AMBULATORY_CARE_PROVIDER_SITE_OTHER): Payer: BLUE CROSS/BLUE SHIELD | Admitting: Internal Medicine

## 2016-06-06 VITALS — BP 140/90 | HR 72 | Temp 98.3°F | Ht 77.0 in | Wt 306.0 lb

## 2016-06-06 DIAGNOSIS — E1122 Type 2 diabetes mellitus with diabetic chronic kidney disease: Secondary | ICD-10-CM | POA: Diagnosis not present

## 2016-06-06 DIAGNOSIS — E1165 Type 2 diabetes mellitus with hyperglycemia: Secondary | ICD-10-CM

## 2016-06-06 DIAGNOSIS — I10 Essential (primary) hypertension: Secondary | ICD-10-CM | POA: Diagnosis not present

## 2016-06-06 DIAGNOSIS — E785 Hyperlipidemia, unspecified: Secondary | ICD-10-CM

## 2016-06-06 DIAGNOSIS — Z0000 Encounter for general adult medical examination without abnormal findings: Secondary | ICD-10-CM

## 2016-06-06 LAB — COMPREHENSIVE METABOLIC PANEL
ALK PHOS: 71 U/L (ref 39–117)
ALT: 23 U/L (ref 0–53)
AST: 16 U/L (ref 0–37)
Albumin: 4.2 g/dL (ref 3.5–5.2)
BILIRUBIN TOTAL: 0.5 mg/dL (ref 0.2–1.2)
BUN: 13 mg/dL (ref 6–23)
CHLORIDE: 102 meq/L (ref 96–112)
CO2: 29 mEq/L (ref 19–32)
CREATININE: 1.5 mg/dL (ref 0.40–1.50)
Calcium: 9.1 mg/dL (ref 8.4–10.5)
GFR: 61.77 mL/min (ref >60.00)
Glucose, Bld: 256 mg/dL — ABNORMAL HIGH (ref 70–99)
Potassium: 3.9 mEq/L (ref 3.5–5.1)
Sodium: 138 mEq/L (ref 135–145)
TOTAL PROTEIN: 7.5 g/dL (ref 6.0–8.3)

## 2016-06-06 LAB — CBC
HEMATOCRIT: 44 % (ref 39.0–52.0)
Hemoglobin: 14.3 g/dL (ref 13.0–17.0)
MCHC: 32.5 g/dL (ref 30.0–36.0)
MCV: 90 fl (ref 78.0–100.0)
Platelets: 228 10*3/uL (ref 150.0–400.0)
RBC: 4.89 Mil/uL (ref 4.22–5.81)
RDW: 13.6 % (ref 11.5–15.5)
WBC: 4.7 10*3/uL (ref 4.0–10.5)

## 2016-06-06 LAB — HEMOGLOBIN A1C: Hgb A1c MFr Bld: 12.7 % — ABNORMAL HIGH (ref 4.6–6.5)

## 2016-06-06 LAB — LIPID PANEL
CHOLESTEROL: 235 mg/dL — AB (ref 0–200)
HDL: 42.3 mg/dL (ref >39.00)
LDL CALC: 177 mg/dL — AB (ref 0–99)
NonHDL: 192.3
Total CHOL/HDL Ratio: 6
Triglycerides: 78 mg/dL (ref 0.0–149.0)
VLDL: 15.6 mg/dL (ref 0.0–40.0)

## 2016-06-06 LAB — MICROALBUMIN / CREATININE URINE RATIO
CREATININE, U: 165.9 mg/dL
MICROALB UR: 6.9 mg/dL — AB (ref 0.0–1.9)
MICROALB/CREAT RATIO: 4.2 mg/g (ref 0.0–30.0)

## 2016-06-06 NOTE — Assessment & Plan Note (Signed)
With some new peripheral neuropathy since last visit. Foot exam done. Eye visit scheduled for the end of this month. Not taking any meds at this time which is dangerous since last HgA1c was >10. Suspect poor control at this check. Checking HgA1c, CMP, lipid, microalbumin to creatinine ratio. Complicated by prior CKD as well and we discussed today how he will get more complications since he is not taking care of his sugars.

## 2016-06-06 NOTE — Patient Instructions (Signed)
We will check the labs today and call you back about the results.   We will likely need to do more for the sugars so we may need to put you back on some of the medicine for the sugars.    Diabetes Mellitus and Standards of Medical Care Managing diabetes (diabetes mellitus) can be complicated. Your diabetes treatment may be managed by a team of health care providers, including:  A diet and nutrition specialist (registered dietitian).  A nurse.  A certified diabetes educator (CDE).  A diabetes specialist (endocrinologist).  An eye doctor.  A primary care provider.  A dentist. Your health care providers follow a schedule in order to help you get the best quality of care. The following schedule is a general guideline for your diabetes management plan. Your health care providers may also give you more specific instructions. HbA1c ( hemoglobin A1c) test This test provides information about blood sugar (glucose) control over the previous 2-3 months. It is used to check whether your diabetes management plan needs to be adjusted.  If you are meeting your treatment goals, this test is done at least 2 times a year.  If you are not meeting treatment goals or if your treatment goals have changed, this test is done 4 times a year. Blood pressure test  This test is done at every routine medical visit. For most people, the goal is less than 140/90. In some cases, your goal blood pressure may be 130/80 or less. Ask your health care provider what your goal blood pressure should be. Dental and eye exams  Visit your dentist two times a year.  If you have type 1 diabetes, get an eye exam 3-5 years after you are diagnosed, and then once a year after your first exam.  If you were diagnosed with type 1 diabetes as a child, get an eye exam when you are age 73 or older and have had diabetes for 3-5 years. After the first exam, you should get an eye exam once a year.  If you have type 2 diabetes, have  an eye exam as soon as you are diagnosed, and then once a year after your first exam. Foot care exam  Visual foot exams are done at every routine medical visit. The exams check for cuts, bruises, redness, blisters, sores, or other problems with the feet.  A complete foot exam is done by your health care provider once a year. This exam includes an inspection of the structure and skin of your feet, and a check of the pulses and sensation in your feet.  Type 1 diabetes: Get your first exam 3-5 years after diagnosis.  Type 2 diabetes: Get your first exam as soon as you are diagnosed.  Check your feet every day for cuts, bruises, redness, blisters, or sores. If you have any of these or other problems that are not healing, contact your health care provider. Kidney function test ( urine microalbumin)  This test is done once a year.  Type 1 diabetes: Get your first test 5 years after diagnosis.  Type 2 diabetes: Get your first test as soon as you are diagnosed.  If you have chronic kidney disease (CKD), get a serum creatinine and estimated glomerular filtration rate (eGFR) test once a year. Lipid profile (cholesterol, HDL, LDL, triglycerides)  This test should be done when you are diagnosed with diabetes, and every 5 years after the first test. If you are on medicines to lower your cholesterol, you may need  to get this test done every year.  The goal for LDL is less than 100 mg/dL (5.5 mmol/L). If you are at high risk, the goal is less than 70 mg/dL (3.9 mmol/L).  The goal for HDL is 40 mg/dL (2.2 mmol/L) for men and 50 mg/dL(2.8 mmol/L) for women. An HDL cholesterol of 60 mg/dL (3.3 mmol/L) or higher gives some protection against heart disease.  The goal for triglycerides is less than 150 mg/dL (8.3 mmol/L). Immunizations  The yearly flu (influenza) vaccine is recommended for everyone 6 months or older who has diabetes.  The pneumonia (pneumococcal) vaccine is recommended for everyone 2  years or older who has diabetes. If you are 70 or older, you may get the pneumonia vaccine as a series of two separate shots.  The hepatitis B vaccine is recommended for adults shortly after they have been diagnosed with diabetes.  The Tdap (tetanus, diphtheria, and pertussis) vaccine should be given:  According to normal childhood vaccination schedules, for children.  Every 10 years, for adults who have diabetes.  The shingles vaccine is recommended for people who have had chicken pox and are 50 years or older. Mental and emotional health  Screening for symptoms of eating disorders, anxiety, and depression is recommended at the time of diagnosis and afterward as needed. If your screening shows that you have symptoms (you have a positive screening result), you may need further evaluation and be referred to a mental health care provider. Diabetes self-management education  Education about how to manage your diabetes is recommended at diagnosis and ongoing as needed. Treatment plan  Your treatment plan will be reviewed at every medical visit. Summary  Managing diabetes (diabetes mellitus) can be complicated. Your diabetes treatment may be managed by a team of health care providers.  Your health care providers follow a schedule in order to help you get the best quality of care.  Standards of care including having regular physical exams, blood tests, blood pressure monitoring, immunizations, screening tests, and education about how to manage your diabetes.  Your health care providers may also give you more specific instructions based on your individual health. This information is not intended to replace advice given to you by your health care provider. Make sure you discuss any questions you have with your health care provider. Document Released: 02/16/2009 Document Revised: 01/18/2016 Document Reviewed: 01/18/2016 Elsevier Interactive Patient Education  2017 [employer]  Maintenance, Male A healthy lifestyle and preventative care can promote health and wellness.  Maintain regular health, dental, and eye exams.  Eat a healthy diet. Foods like vegetables, fruits, whole grains, low-fat dairy products, and lean protein foods contain the nutrients you need and are low in calories. Decrease your intake of foods high in solid fats, added sugars, and salt. Get information about a proper diet from your health care provider, if necessary.  Regular physical exercise is one of the most important things you can do for your health. Most adults should get at least 150 minutes of moderate-intensity exercise (any activity that increases your heart rate and causes you to sweat) each week. In addition, most adults need muscle-strengthening exercises on 2 or more days a week.   Maintain a healthy weight. The body mass index (BMI) is a screening tool to identify possible weight problems. It provides an estimate of body fat based on height and weight. Your health care provider can find your BMI and can help you achieve or maintain a healthy weight. For males 20  years and older:  A BMI below 18.5 is considered underweight.  A BMI of 18.5 to 24.9 is normal.  A BMI of 25 to 29.9 is considered overweight.  A BMI of 30 and above is considered obese.  Maintain normal blood lipids and cholesterol by exercising and minimizing your intake of saturated fat. Eat a balanced diet with plenty of fruits and vegetables. Blood tests for lipids and cholesterol should begin at age 65 and be repeated every 5 years. If your lipid or cholesterol levels are high, you are over age 52, or you are at high risk for heart disease, you may need your cholesterol levels checked more frequently.Ongoing high lipid and cholesterol levels should be treated with medicines if diet and exercise are not working.  If you smoke, find out from your health care provider how to quit. If you do not use tobacco, do not  start.  Lung cancer screening is recommended for adults aged 52-80 years who are at high risk for developing lung cancer because of a history of smoking. A yearly low-dose CT scan of the lungs is recommended for people who have at least a 30-pack-year history of smoking and are current smokers or have quit within the past 15 years. A pack year of smoking is smoking an average of 1 pack of cigarettes a day for 1 year (for example, a 30-pack-year history of smoking could mean smoking 1 pack a day for 30 years or 2 packs a day for 15 years). Yearly screening should continue until the smoker has stopped smoking for at least 15 years. Yearly screening should be stopped for people who develop a health problem that would prevent them from having lung cancer treatment.  If you choose to drink alcohol, do not have more than 2 drinks per day. One drink is considered to be 12 oz (360 mL) of beer, 5 oz (150 mL) of wine, or 1.5 oz (45 mL) of liquor.  Avoid the use of street drugs. Do not share needles with anyone. Ask for help if you need support or instructions about stopping the use of drugs.  High blood pressure causes heart disease and increases the risk of stroke. High blood pressure is more likely to develop in:  People who have blood pressure in the end of the normal range (100-139/85-89 mm Hg).  People who are overweight or obese.  People who are African American.  If you are 62-75 years of age, have your blood pressure checked every 3-5 years. If you are 71 years of age or older, have your blood pressure checked every year. You should have your blood pressure measured twice-once when you are at a hospital or clinic, and once when you are not at a hospital or clinic. Record the average of the two measurements. To check your blood pressure when you are not at a hospital or clinic, you can use:  An automated blood pressure machine at a pharmacy.  A home blood pressure monitor.  If you are 17-79 years  old, ask your health care provider if you should take aspirin to prevent heart disease.  Diabetes screening involves taking a blood sample to check your fasting blood sugar level. This should be done once every 3 years after age 65 if you are at a normal weight and without risk factors for diabetes. Testing should be considered at a younger age or be carried out more frequently if you are overweight and have at least 1 risk factor for diabetes.  Colorectal cancer can be detected and often prevented. Most routine colorectal cancer screening begins at the age of 29 and continues through age 83. However, your health care provider may recommend screening at an earlier age if you have risk factors for colon cancer. On a yearly basis, your health care provider may provide home test kits to check for hidden blood in the stool. A small camera at the end of a tube may be used to directly examine the colon (sigmoidoscopy or colonoscopy) to detect the earliest forms of colorectal cancer. Talk to your health care provider about this at age 72 when routine screening begins. A direct exam of the colon should be repeated every 5-10 years through age 77, unless early forms of precancerous polyps or small growths are found.  People who are at an increased risk for hepatitis B should be screened for this virus. You are considered at high risk for hepatitis B if:  You were born in a country where hepatitis B occurs often. Talk with your health care provider about which countries are considered high risk.  Your parents were born in a high-risk country and you have not received a shot to protect against hepatitis B (hepatitis B vaccine).  You have HIV or AIDS.  You use needles to inject street drugs.  You live with, or have sex with, someone who has hepatitis B.  You are a man who has sex with other men (MSM).  You get hemodialysis treatment.  You take certain medicines for conditions like cancer, organ  transplantation, and autoimmune conditions.  Hepatitis C blood testing is recommended for all people born from [birth year] through 1963 and any individual with known risk factors for hepatitis C.  Healthy men should no longer receive prostate-specific antigen (PSA) blood tests as part of routine cancer screening. Talk to your health care provider about prostate cancer screening.  Testicular cancer screening is not recommended for adolescents or adult males who have no symptoms. Screening includes self-exam, a health care provider exam, and other screening tests. Consult with your health care provider about any symptoms you have or any concerns you have about testicular cancer.  Practice safe sex. Use condoms and avoid high-risk sexual practices to reduce the spread of sexually transmitted infections (STIs).  You should be screened for STIs, including gonorrhea and chlamydia if:  You are sexually active and are younger than 24 years.  You are older than 24 years, and your health care provider tells you that you are at risk for this type of infection.  Your sexual activity has changed since you were last screened, and you are at an increased risk for chlamydia or gonorrhea. Ask your health care provider if you are at risk.  If you are at risk of being infected with HIV, it is recommended that you take a prescription medicine daily to prevent HIV infection. This is called pre-exposure prophylaxis (PrEP). You are considered at risk if:  You are a man who has sex with other men (MSM).  You are a heterosexual man who is sexually active with multiple partners.  You take drugs by injection.  You are sexually active with a partner who has HIV.  Talk with your health care provider about whether you are at high risk of being infected with HIV. If you choose to begin PrEP, you should first be tested for HIV. You should then be tested every 3 months for as long as you are taking PrEP.  Use sunscreen. Apply  sunscreen liberally and repeatedly throughout the day. You should seek shade when your shadow is shorter than you. Protect yourself by wearing long sleeves, pants, a wide-brimmed hat, and sunglasses year round whenever you are outdoors.  Tell your health care provider of new moles or changes in moles, especially if there is a change in shape or color. Also, tell your health care provider if a mole is larger than the size of a pencil eraser.  A one-time screening for abdominal aortic aneurysm (AAA) and surgical repair of large AAAs by ultrasound is recommended for men aged 41-75 years who are current or former smokers.  Stay current with your vaccines (immunizations). This information is not intended to replace advice given to you by your health care provider. Make sure you discuss any questions you have with your health care provider. Document Released: 10/18/2007 Document Revised: 05/12/2014 Document Reviewed: 01/23/2015 Elsevier Interactive Patient Education  2017 [employer].

## 2016-06-06 NOTE — Assessment & Plan Note (Signed)
Not taking any meds, checking lipid panel today and adjust for goal LDL <70.

## 2016-06-06 NOTE — Progress Notes (Signed)
Pre visit review using our clinic review tool, if applicable. No additional management support is needed unless otherwise documented below in the visit note. 

## 2016-06-06 NOTE — Progress Notes (Signed)
   Subjective:    Patient ID: Johnny [name], male    DOB: Oct 17, 1955, 61 y.o.   MRN: [id number]  HPI The patient is coming in for wellness. Has not been seen for 1.5 years and poorly controlled diabetic. He is not taking any medications for his diabetes at this time. Some tingling in his feet right more than left.   PMH, [facility], social history reviewed and updated.   Review of Systems  Constitutional: Negative for activity change, appetite change, fatigue, fever and unexpected weight change.  HENT: Negative.   Eyes: Negative.   Respiratory: Negative.   Cardiovascular: Negative.   Gastrointestinal: Negative.   Musculoskeletal: Negative.   Skin: Negative.   Neurological: Negative for dizziness, tremors, syncope, speech difficulty, weakness, light-headedness and headaches.       Tingling in feet  Psychiatric/Behavioral: Negative.       Objective:   Physical Exam  Constitutional: He is oriented to person, place, and time. He appears well-developed and well-nourished.  Obese  HENT:  Head: Normocephalic and atraumatic.  Eyes: EOM are normal. Pupils are equal, round, and reactive to light.  Neck: Normal range of motion.  Cardiovascular: Normal rate and regular rhythm.   Pulmonary/Chest: Effort normal and breath sounds normal.  Abdominal: Soft. Bowel sounds are normal. He exhibits no distension. There is no tenderness. There is no rebound.  Musculoskeletal: He exhibits no edema.  Neurological: He is alert and oriented to person, place, and time. Coordination normal.  Skin: Skin is warm and dry.  Foot exam done   Vitals:   06/06/16 0851  BP: 140/90  Pulse: 72  Temp: 98.3 F (36.8 C)  TempSrc: Oral  SpO2: 98%  Weight: (!) 306 lb (138.8 kg)  Height: 6\' 5"  (1.956 m)      Assessment & Plan:

## 2016-06-06 NOTE — Assessment & Plan Note (Signed)
Declines flu shot today. Eye exam behind. Colonoscopy done, tetanus and pneumonia up to date. Counseled about the serious nature of his medical conditions and the need for more regular follow up. Counseled about sun safety and mole surveillance. Given screening recommendations.

## 2016-06-06 NOTE — Assessment & Plan Note (Signed)
BP at goal on 1/2 pill benicar daily which he is taking. Reminded him how important it is to keep taking his medications long term and not stopping.

## 2016-06-09 ENCOUNTER — Telehealth: Payer: Self-pay | Admitting: Emergency Medicine

## 2016-06-09 NOTE — Telephone Encounter (Signed)
Pt returned your call about his labs. He asked that you return his call when you get a chance. Please advise thanks.

## 2016-06-09 NOTE — Telephone Encounter (Signed)
LVM for patient to call back. ?

## 2016-06-11 NOTE — Telephone Encounter (Signed)
Patient called back in.  Gave MD response on labs.  Does want referral and medication.

## 2016-06-12 ENCOUNTER — Other Ambulatory Visit: Payer: Self-pay | Admitting: Internal Medicine

## 2016-06-12 DIAGNOSIS — E1165 Type 2 diabetes mellitus with hyperglycemia: Principal | ICD-10-CM

## 2016-06-12 DIAGNOSIS — E1122 Type 2 diabetes mellitus with diabetic chronic kidney disease: Secondary | ICD-10-CM

## 2016-06-12 MED ORDER — METFORMIN HCL 1000 MG PO TABS: 1000.0000 mg | ORAL_TABLET | Freq: Two times a day (BID) | ORAL | 3 refills | Status: DC

## 2016-06-12 MED ORDER — ATORVASTATIN CALCIUM 40 MG PO TABS: 40.0000 mg | ORAL_TABLET | Freq: Every day | ORAL | 3 refills | Status: DC

## 2016-06-12 NOTE — Telephone Encounter (Signed)
Placed.

## 2016-07-01 ENCOUNTER — Encounter: Payer: Self-pay | Admitting: Internal Medicine

## 2016-11-21 ENCOUNTER — Other Ambulatory Visit: Payer: Self-pay | Admitting: Internal Medicine

## 2016-11-21 DIAGNOSIS — I10 Essential (primary) hypertension: Secondary | ICD-10-CM

## 2017-06-10 ENCOUNTER — Encounter: Payer: Self-pay | Admitting: Internal Medicine

## 2017-06-10 ENCOUNTER — Other Ambulatory Visit (INDEPENDENT_AMBULATORY_CARE_PROVIDER_SITE_OTHER): Payer: BLUE CROSS/BLUE SHIELD

## 2017-06-10 ENCOUNTER — Ambulatory Visit: Payer: BLUE CROSS/BLUE SHIELD | Admitting: Internal Medicine

## 2017-06-10 VITALS — BP 152/78 | HR 73 | Temp 98.8°F | Wt 288.4 lb

## 2017-06-10 DIAGNOSIS — I10 Essential (primary) hypertension: Secondary | ICD-10-CM | POA: Diagnosis not present

## 2017-06-10 DIAGNOSIS — N183 Chronic kidney disease, stage 3 unspecified: Secondary | ICD-10-CM

## 2017-06-10 DIAGNOSIS — E1169 Type 2 diabetes mellitus with other specified complication: Secondary | ICD-10-CM

## 2017-06-10 DIAGNOSIS — E1129 Type 2 diabetes mellitus with other diabetic kidney complication: Secondary | ICD-10-CM

## 2017-06-10 DIAGNOSIS — E1165 Type 2 diabetes mellitus with hyperglycemia: Secondary | ICD-10-CM

## 2017-06-10 DIAGNOSIS — E785 Hyperlipidemia, unspecified: Secondary | ICD-10-CM

## 2017-06-10 DIAGNOSIS — N184 Chronic kidney disease, stage 4 (severe): Secondary | ICD-10-CM | POA: Insufficient documentation

## 2017-06-10 DIAGNOSIS — IMO0002 Reserved for concepts with insufficient information to code with codable children: Secondary | ICD-10-CM

## 2017-06-10 HISTORY — DX: Chronic kidney disease, stage 3 unspecified: N18.30

## 2017-06-10 LAB — CBC
HEMATOCRIT: 40.2 % (ref 39.0–52.0)
HEMOGLOBIN: 13.2 g/dL (ref 13.0–17.0)
MCHC: 32.7 g/dL (ref 30.0–36.0)
MCV: 90.9 fl (ref 78.0–100.0)
PLATELETS: 255 10*3/uL (ref 150.0–400.0)
RBC: 4.43 Mil/uL (ref 4.22–5.81)
RDW: 13.2 % (ref 11.5–15.5)
WBC: 5.3 10*3/uL (ref 4.0–10.5)

## 2017-06-10 LAB — MICROALBUMIN / CREATININE URINE RATIO
Creatinine,U: 167.2 mg/dL
Microalb Creat Ratio: 2.1 mg/g (ref 0.0–30.0)
Microalb, Ur: 3.5 mg/dL — ABNORMAL HIGH (ref 0.0–1.9)

## 2017-06-10 LAB — HEMOGLOBIN A1C: Hgb A1c MFr Bld: 10 % — ABNORMAL HIGH (ref 4.6–6.5)

## 2017-06-10 LAB — LIPID PANEL
CHOLESTEROL: 207 mg/dL — AB (ref 0–200)
HDL: 38.6 mg/dL — ABNORMAL LOW (ref >39.00)
LDL CALC: 150 mg/dL — AB (ref 0–99)
NonHDL: 168.05
Total CHOL/HDL Ratio: 5
Triglycerides: 92 mg/dL (ref 0.0–149.0)
VLDL: 18.4 mg/dL (ref 0.0–40.0)

## 2017-06-10 LAB — COMPREHENSIVE METABOLIC PANEL
ALBUMIN: 4 g/dL (ref 3.5–5.2)
ALT: 22 U/L (ref 0–53)
AST: 16 U/L (ref 0–37)
Alkaline Phosphatase: 54 U/L (ref 39–117)
BUN: 37 mg/dL — ABNORMAL HIGH (ref 6–23)
CALCIUM: 9.5 mg/dL (ref 8.4–10.5)
CHLORIDE: 105 meq/L (ref 96–112)
CO2: 27 mEq/L (ref 19–32)
Creatinine, Ser: 3.73 mg/dL — ABNORMAL HIGH (ref 0.40–1.50)
GFR: 21.52 mL/min — AB (ref >60.00)
Glucose, Bld: 139 mg/dL — ABNORMAL HIGH (ref 70–99)
POTASSIUM: 4.6 meq/L (ref 3.5–5.1)
Sodium: 142 mEq/L (ref 135–145)
Total Bilirubin: 0.4 mg/dL (ref 0.2–1.2)
Total Protein: 7.4 g/dL (ref 6.0–8.3)

## 2017-06-10 MED ORDER — OLMESARTAN MEDOXOMIL-HCTZ 40-12.5 MG PO TABS: 1.0000 | ORAL_TABLET | Freq: Every day | ORAL | 1 refills | Status: DC

## 2017-06-10 NOTE — Progress Notes (Addendum)
   Subjective:    Patient ID: Johnny [name], male    DOB: [birth year], 62 y.o.   MRN: [id number]  HPI The patient is a 62 YO man coming in for follow up on multiple problems including his diabetes (not taking any medications right now due to changing his diet and exercise and feeling like it was enough, states he felt some bad after taking the metformin once and was worried about low sugars and never took it again, denies numbness or burning in his feet, complicated by nephropathy) and his blood pressure (uncontrolled today, taking benicar hctz, denies headaches or chest pains, goal <120/80) and his ckd stage 3 (due to mixed diabetes and blood pressure which are not well controlled, diabetes not at goal) and his cholesterol (not taking lipitor and only took it for a short time, denies real side effects but changed his diet and exercise and felt like that was enough). He denies new concerns but needed refills on his medications.   Review of Systems  Constitutional: Negative.   HENT: Negative.   Eyes: Negative.   Respiratory: Negative for cough, chest tightness and shortness of breath.   Cardiovascular: Negative for chest pain, palpitations and leg swelling.  Gastrointestinal: Negative for abdominal distention, abdominal pain, constipation, diarrhea, nausea and vomiting.  Musculoskeletal: Negative.   Skin: Negative.   Neurological: Negative.   Psychiatric/Behavioral: Negative.       Objective:   Physical Exam  Constitutional: He is oriented to person, place, and time. He appears well-developed and well-nourished.  HENT:  Head: Normocephalic and atraumatic.  Eyes: EOM are normal.  Neck: Normal range of motion.  Cardiovascular: Normal rate and regular rhythm.  Pulmonary/Chest: Effort normal and breath sounds normal. No respiratory distress. He has no wheezes. He has no rales.  Abdominal: Soft. Bowel sounds are normal. He exhibits no distension. There is no tenderness. There is no rebound.    Musculoskeletal: He exhibits no edema.  Neurological: He is alert and oriented to person, place, and time. Coordination normal.  Skin: Skin is warm and dry.  See foot exam  Psychiatric: He has a normal mood and affect.   Vitals:   06/10/17 0808  BP: (!) 152/78  Pulse: 73  Temp: 98.8 F (37.1 C)  TempSrc: Oral  SpO2: 95%  Weight: 288 lb 6.4 oz (130.8 kg)   EKG: Rate 67, axis normal, intervals normal, no st or t wave changes, improved compared to prior 2012 EKG.     Assessment & Plan:  Visit time 40 minutes: greater than 50% of that time was spent in face to face counseling and coordination of care with the patient: counseled about the severe nature of his uncontrolled diabetes, high risk of CV disease including heart attack or stroke, and uncontrolled hypertension

## 2017-06-10 NOTE — Patient Instructions (Addendum)
We are checking the labs today and will call you back with the results.   We will likely start you back on some medicine for the sugars and cholesterol to help lower your risk of heart attack and stroke.  Your current risk of heart attack or stroke in the next 10 years is about 33% which is very high. We would like you to start taking a baby aspirin daily (81 mg).   Come back in about 3 months to check on the sugars as we should check these every 3 months to work on getting them better. You should also make sure to see the eye doctor once a year to check for diabetes in the eyes. This can be treated if present and can cause blindness if not found early.    Diabetes Mellitus and Standards of Medical Care Managing diabetes (diabetes mellitus) can be complicated. Your diabetes treatment may be managed by a team of health care providers, including:  A diet and nutrition specialist (registered dietitian).  A nurse.  A certified diabetes educator (CDE).  A diabetes specialist (endocrinologist).  An eye doctor.  A primary care provider.  A dentist.  Your health care providers follow a schedule in order to help you get the best quality of care. The following schedule is a general guideline for your diabetes management plan. Your health care providers may also give you more specific instructions. HbA1c ( hemoglobin A1c) test This test provides information about blood sugar (glucose) control over the previous 2-3 months. It is used to check whether your diabetes management plan needs to be adjusted.  If you are meeting your treatment goals, this test is done at least 2 times a year.  If you are not meeting treatment goals or if your treatment goals have changed, this test is done 4 times a year.  Blood pressure test  This test is done at every routine medical visit. For most people, the goal is less than 130/80. Ask your health care provider what your goal blood pressure should be. Dental  and eye exams  Visit your dentist two times a year.  If you have type 1 diabetes, get an eye exam 3-5 years after you are diagnosed, and then once a year after your first exam. ? If you were diagnosed with type 1 diabetes as a child, get an eye exam when you are age 20 or older and have had diabetes for 3-5 years. After the first exam, you should get an eye exam once a year.  If you have type 2 diabetes, have an eye exam as soon as you are diagnosed, and then once a year after your first exam. Foot care exam  Visual foot exams are done at every routine medical visit. The exams check for cuts, bruises, redness, blisters, sores, or other problems with the feet.  A complete foot exam is done by your health care provider once a year. This exam includes an inspection of the structure and skin of your feet, and a check of the pulses and sensation in your feet. ? Type 1 diabetes: Get your first exam 3-5 years after diagnosis. ? Type 2 diabetes: Get your first exam as soon as you are diagnosed.  Check your feet every day for cuts, bruises, redness, blisters, or sores. If you have any of these or other problems that are not healing, contact your health care provider. Kidney function test ( urine microalbumin)  This test is done once a year. ? Type  1 diabetes: Get your first test 5 years after diagnosis. ? Type 2 diabetes: Get your first test as soon as you are diagnosed.  If you have chronic kidney disease (CKD), get a serum creatinine and estimated glomerular filtration rate (eGFR) test once a year. Lipid profile (cholesterol, HDL, LDL, triglycerides)  This test should be done when you are diagnosed with diabetes, and every 5 years after the first test. If you are on medicines to lower your cholesterol, you may need to get this test done every year. ? The goal for LDL is less than 100 mg/dL (5.5 mmol/L). If you are at high risk, the goal is less than 70 mg/dL (3.9 mmol/L). ? The goal for HDL is  40 mg/dL (2.2 mmol/L) for men and 50 mg/dL(2.8 mmol/L) for women. An HDL cholesterol of 60 mg/dL (3.3 mmol/L) or higher gives some protection against heart disease. ? The goal for triglycerides is less than 150 mg/dL (8.3 mmol/L). Immunizations  The yearly flu (influenza) vaccine is recommended for everyone 6 months or older who has diabetes.  The pneumonia (pneumococcal) vaccine is recommended for everyone 2 years or older who has diabetes. If you are 20 or older, you may get the pneumonia vaccine as a series of two separate shots.  The hepatitis B vaccine is recommended for adults shortly after they have been diagnosed with diabetes.  The Tdap (tetanus, diphtheria, and pertussis) vaccine should be given: ? According to normal childhood vaccination schedules, for children. ? Every 10 years, for adults who have diabetes.  The shingles vaccine is recommended for people who have had chicken pox and are 50 years or older. Mental and emotional health  Screening for symptoms of eating disorders, anxiety, and depression is recommended at the time of diagnosis and afterward as needed. If your screening shows that you have symptoms (you have a positive screening result), you may need further evaluation and be referred to a mental health care provider. Diabetes self-management education  Education about how to manage your diabetes is recommended at diagnosis and ongoing as needed. Treatment plan  Your treatment plan will be reviewed at every medical visit. Summary  Managing diabetes (diabetes mellitus) can be complicated. Your diabetes treatment may be managed by a team of health care providers.  Your health care providers follow a schedule in order to help you get the best quality of care.  Standards of care including having regular physical exams, blood tests, blood pressure monitoring, immunizations, screening tests, and education about how to manage your diabetes.  Your health care  providers may also give you more specific instructions based on your individual health. This information is not intended to replace advice given to you by your health care provider. Make sure you discuss any questions you have with your health care provider. Document Released: 02/16/2009 Document Revised: 01/18/2016 Document Reviewed: 01/18/2016 Elsevier Interactive Patient Education  [employer].

## 2017-06-10 NOTE — Assessment & Plan Note (Signed)
Suspect poor control, last lipid panel not at goal of LDL <100. Given his CV risk of 33% needs to be on high intensity statin. He desires to check labs before committing to medication. Will check lipid panel.

## 2017-06-10 NOTE — Assessment & Plan Note (Signed)
He is likely not controlled as he is off medication for some time. Needs CMP as he is close to threshold for stopping metformin usage due to Cr. He is not taking any medications and last HgA1c 12.7. He is reminded about eye exam yearly and foot exam done today. Complicated by CKD stage 3. Adjust as needed. He is on ARB but not statin. Informed that he needs to be on statin and he wishes to check labs first.

## 2017-06-10 NOTE — Assessment & Plan Note (Addendum)
Out of BP med for several weeks and previously at goal on benicar hctz. Will keep for now and talked to him about the importance of good BP control. CV risk 33% and advised to start taking aspirin 81 mg daily. Checking CMP and adjust medication as needed. EKG done today which is not changed from 2012.

## 2017-06-10 NOTE — Assessment & Plan Note (Signed)
Multifactorial with blood pressure and diabetes. There is family history of renal transplant and dialysis making him high risk to dialysis and reminded him about this today. He is willing to work on BP and diabetes and we talked about how important this is.

## 2017-06-12 ENCOUNTER — Telehealth: Payer: Self-pay

## 2017-06-12 NOTE — Telephone Encounter (Signed)
Left multiple message for patient to call back for lab results. His kidney function is dramatically worse than last time and we need him to go to the ER for evaluation to see if they can reverse this.

## 2017-08-28 ENCOUNTER — Ambulatory Visit (INDEPENDENT_AMBULATORY_CARE_PROVIDER_SITE_OTHER): Payer: BLUE CROSS/BLUE SHIELD | Admitting: Internal Medicine

## 2017-08-28 ENCOUNTER — Encounter: Payer: Self-pay | Admitting: Internal Medicine

## 2017-08-28 DIAGNOSIS — J0110 Acute frontal sinusitis, unspecified: Secondary | ICD-10-CM | POA: Diagnosis not present

## 2017-08-28 DIAGNOSIS — J329 Chronic sinusitis, unspecified: Secondary | ICD-10-CM | POA: Insufficient documentation

## 2017-08-28 MED ORDER — AMOXICILLIN-POT CLAVULANATE 875-125 MG PO TABS: 1.0000 | ORAL_TABLET | Freq: Two times a day (BID) | ORAL | 0 refills | Status: DC

## 2017-08-28 NOTE — Assessment & Plan Note (Signed)
Rx for augmentin and advised to resume zyrtec daily.

## 2017-08-28 NOTE — Patient Instructions (Signed)
We have sent in the augmentin to take 1 pill twice a day for 1 week.  Start taking zyrtec (cetirizine) over the counter to help as well.   It is okay to take the affrin in the evening.

## 2017-08-28 NOTE — Progress Notes (Signed)
   Subjective:    Patient ID: Johnny [name], male    DOB: 1956/01/14, 62 y.o.   MRN: [id number]  HPI The patient is a 62 YO man coming in for cough and SOB starting about 4-5 days ago. He is having nose congestion and drainage. Overall worsening. Denies sick contacts known. Denies fevers but some chills. Denies headaches although some sinus pressure. Some ear pain. Has tried otc cold and allergy medicine which did not help so he stopped.   Review of Systems  Constitutional: Positive for activity change, appetite change and chills. Negative for fatigue, fever and unexpected weight change.  HENT: Positive for congestion, postnasal drip, rhinorrhea and sinus pressure. Negative for ear discharge, ear pain, sinus pain, sneezing, sore throat, tinnitus, trouble swallowing and voice change.   Eyes: Negative.   Respiratory: Positive for cough and shortness of breath. Negative for chest tightness and wheezing.   Cardiovascular: Negative.   Gastrointestinal: Negative.   Musculoskeletal: Positive for myalgias.  Neurological: Negative.       Objective:   Physical Exam  Constitutional: He is oriented to person, place, and time. He appears well-developed and well-nourished.  HENT:  Head: Normocephalic and atraumatic.  Oropharynx with redness and clear drainage, nose with swollen turbinates, TMs normal bilaterally  Eyes: EOM are normal.  Neck: Normal range of motion. No thyromegaly present.  Cardiovascular: Normal rate and regular rhythm.  Pulmonary/Chest: Effort normal and breath sounds normal. No respiratory distress. He has no wheezes. He has no rales.  Some rhonchi in the left lower lung  Abdominal: Soft.  Lymphadenopathy:    He has no cervical adenopathy.  Neurological: He is alert and oriented to person, place, and time.  Skin: Skin is warm and dry.   Vitals:   08/28/17 1435  BP: (!) 150/94  Pulse: 76  Temp: 98.9 F (37.2 C)  TempSrc: Oral  SpO2: 97%  Weight: 296 lb (134.3 kg)    Height: 6\' 5"  (1.956 m)      Assessment & Plan:

## 2018-03-09 ENCOUNTER — Encounter: Payer: Self-pay | Admitting: Internal Medicine

## 2018-03-16 ENCOUNTER — Ambulatory Visit: Payer: BLUE CROSS/BLUE SHIELD | Admitting: Internal Medicine

## 2018-03-16 ENCOUNTER — Encounter: Payer: Self-pay | Admitting: Internal Medicine

## 2018-03-16 VITALS — BP 160/100 | HR 74 | Temp 98.0°F | Ht 77.0 in | Wt 308.0 lb

## 2018-03-16 DIAGNOSIS — M25552 Pain in left hip: Secondary | ICD-10-CM

## 2018-03-16 MED ORDER — KETOROLAC TROMETHAMINE 30 MG/ML IJ SOLN
30.0000 mg | Freq: Once | INTRAMUSCULAR | Status: AC
  Administered 2018-03-16: 30 mg via INTRAMUSCULAR

## 2018-03-16 MED ORDER — PREDNISONE 20 MG PO TABS: 40.0000 mg | ORAL_TABLET | Freq: Every day | ORAL | 0 refills | Status: AC

## 2018-03-16 MED ORDER — METHYLPREDNISOLONE ACETATE 40 MG/ML IJ SUSP
40.0000 mg | Freq: Once | INTRAMUSCULAR | Status: AC
  Administered 2018-03-16: 40 mg via INTRAMUSCULAR

## 2018-03-16 MED ORDER — CYCLOBENZAPRINE HCL 5 MG PO TABS: 5.0000 mg | ORAL_TABLET | Freq: Three times a day (TID) | ORAL | 0 refills | Status: DC | PRN

## 2018-03-16 NOTE — Assessment & Plan Note (Signed)
Given depo-medrol 40 mg IM and toradol 30 mg IM. Rx for prednisone quick burst and flexeril. If no improvement will check x-ray low back as SI joint is the source of pain on exam.

## 2018-03-16 NOTE — Patient Instructions (Signed)
We have given you two shots today: 1 for pain and 1 for inflammation to get you feeling better.   We have sent in two medicines: prednisone to help with the pain, take 2 pills daily for 4 days  The other medicine is flexeril which is the muscle relaxer to use for pain if needed up to 3 times per day.

## 2018-03-16 NOTE — Progress Notes (Signed)
   Subjective:    Patient ID: Johnny [name], male    DOB: 07/23/1955, 62 y.o.   MRN: [id number]  HPI The patient is a 62 YO man coming in for left hip pain. Started a couple months ago and he did some massage and exercises and this helped it go away. It started again about 6 days ago. Denies injury or overuse. He denies numbness or collapse of legs. Pain is in the left thigh and low back area. Does radiate to his left knee area. Has tried ibuprofen which did not help so he did not try again. Tried stretching and heat which did not help much.   Review of Systems  Constitutional: Positive for activity change. Negative for appetite change, fatigue, fever and unexpected weight change.  Respiratory: Negative.   Cardiovascular: Negative.   Musculoskeletal: Positive for arthralgias, back pain and myalgias.  Skin: Negative.   Neurological: Negative for syncope, weakness and numbness.      Objective:   Physical Exam  Constitutional: He is oriented to person, place, and time. He appears well-developed and well-nourished.  HENT:  Head: Normocephalic and atraumatic.  Eyes: EOM are normal.  Neck: Normal range of motion.  Cardiovascular: Normal rate and regular rhythm.  Pulmonary/Chest: Effort normal and breath sounds normal. No respiratory distress. He has no wheezes. He has no rales.  Musculoskeletal: He exhibits tenderness. He exhibits no edema.  Pain in the left SI joint and left lateral thigh, some pain lumbar spinal.   Neurological: He is alert and oriented to person, place, and time. Coordination normal.  Skin: Skin is warm and dry.   Vitals:   03/16/18 0953  BP: (!) 160/100  Pulse: 74  Temp: 98 F (36.7 C)  TempSrc: Oral  SpO2: 99%  Weight: (!) 308 lb (139.7 kg)  Height: 6\' 5"  (1.956 m)      Assessment & Plan:

## 2018-06-11 ENCOUNTER — Other Ambulatory Visit (INDEPENDENT_AMBULATORY_CARE_PROVIDER_SITE_OTHER): Payer: BLUE CROSS/BLUE SHIELD

## 2018-06-11 ENCOUNTER — Ambulatory Visit (INDEPENDENT_AMBULATORY_CARE_PROVIDER_SITE_OTHER): Payer: BLUE CROSS/BLUE SHIELD | Admitting: Internal Medicine

## 2018-06-11 ENCOUNTER — Encounter: Payer: Self-pay | Admitting: Internal Medicine

## 2018-06-11 VITALS — BP 180/110 | HR 83 | Temp 97.9°F | Ht 77.0 in | Wt 306.0 lb

## 2018-06-11 DIAGNOSIS — Z0000 Encounter for general adult medical examination without abnormal findings: Secondary | ICD-10-CM

## 2018-06-11 DIAGNOSIS — N183 Chronic kidney disease, stage 3 unspecified: Secondary | ICD-10-CM

## 2018-06-11 DIAGNOSIS — E1129 Type 2 diabetes mellitus with other diabetic kidney complication: Secondary | ICD-10-CM

## 2018-06-11 DIAGNOSIS — E1169 Type 2 diabetes mellitus with other specified complication: Secondary | ICD-10-CM

## 2018-06-11 DIAGNOSIS — I10 Essential (primary) hypertension: Secondary | ICD-10-CM | POA: Diagnosis not present

## 2018-06-11 DIAGNOSIS — E1165 Type 2 diabetes mellitus with hyperglycemia: Secondary | ICD-10-CM

## 2018-06-11 DIAGNOSIS — IMO0002 Reserved for concepts with insufficient information to code with codable children: Secondary | ICD-10-CM

## 2018-06-11 DIAGNOSIS — Z86010 Personal history of colonic polyps: Secondary | ICD-10-CM | POA: Diagnosis not present

## 2018-06-11 DIAGNOSIS — E785 Hyperlipidemia, unspecified: Secondary | ICD-10-CM

## 2018-06-11 LAB — COMPREHENSIVE METABOLIC PANEL
ALT: 47 U/L (ref 0–53)
AST: 25 U/L (ref 0–37)
Albumin: 4.2 g/dL (ref 3.5–5.2)
Alkaline Phosphatase: 82 U/L (ref 39–117)
BUN: 22 mg/dL (ref 6–23)
CO2: 27 mEq/L (ref 19–32)
Calcium: 9.7 mg/dL (ref 8.4–10.5)
Chloride: 102 mEq/L (ref 96–112)
Creatinine, Ser: 1.69 mg/dL — ABNORMAL HIGH (ref 0.40–1.50)
GFR: 50.3 mL/min — ABNORMAL LOW (ref >60.00)
GLUCOSE: 267 mg/dL — AB (ref 70–99)
Potassium: 3.8 mEq/L (ref 3.5–5.1)
Sodium: 139 mEq/L (ref 135–145)
Total Bilirubin: 0.5 mg/dL (ref 0.2–1.2)
Total Protein: 7.2 g/dL (ref 6.0–8.3)

## 2018-06-11 LAB — LIPID PANEL
CHOL/HDL RATIO: 6
Cholesterol: 256 mg/dL — ABNORMAL HIGH (ref 0–200)
HDL: 44 mg/dL (ref >39.00)
LDL Cholesterol: 187 mg/dL — ABNORMAL HIGH (ref 0–99)
NonHDL: 211.53
Triglycerides: 123 mg/dL (ref 0.0–149.0)
VLDL: 24.6 mg/dL (ref 0.0–40.0)

## 2018-06-11 LAB — HEMOGLOBIN A1C: HEMOGLOBIN A1C: 13.5 % — AB (ref 4.6–6.5)

## 2018-06-11 LAB — CBC
HCT: 43.2 % (ref 39.0–52.0)
Hemoglobin: 14.2 g/dL (ref 13.0–17.0)
MCHC: 33 g/dL (ref 30.0–36.0)
MCV: 89 fl (ref 78.0–100.0)
PLATELETS: 250 10*3/uL (ref 150.0–400.0)
RBC: 4.85 Mil/uL (ref 4.22–5.81)
RDW: 13.4 % (ref 11.5–15.5)
WBC: 5.1 10*3/uL (ref 4.0–10.5)

## 2018-06-11 LAB — MICROALBUMIN / CREATININE URINE RATIO
Creatinine,U: 90.4 mg/dL
MICROALB/CREAT RATIO: 16.3 mg/g (ref 0.0–30.0)
Microalb, Ur: 14.8 mg/dL — ABNORMAL HIGH (ref 0.0–1.9)

## 2018-06-11 MED ORDER — LOSARTAN POTASSIUM 100 MG PO TABS: 100.0000 mg | ORAL_TABLET | Freq: Every day | ORAL | 0 refills | Status: DC

## 2018-06-11 MED ORDER — HYDRALAZINE HCL 25 MG PO TABS: 25.0000 mg | ORAL_TABLET | Freq: Three times a day (TID) | ORAL | 3 refills | Status: DC

## 2018-06-11 NOTE — Assessment & Plan Note (Signed)
Not taking medication currently. We talked about how this can lower risk of heart attack and stroke and he is uninterested at this time despiter >30% risk CV event. Not taking aspirin 81 mg daily.

## 2018-06-11 NOTE — Progress Notes (Signed)
   Subjective:   Patient ID: Johnny [name], male    DOB: 05/09/1955, 63 y.o.   MRN: [id number]  HPI The patient is a 63 YO man coming in for physical  PMH, [facility], social history reviewed and updated  Review of Systems  Constitutional: Negative.   HENT: Negative.   Eyes: Negative.   Respiratory: Negative for cough, chest tightness and shortness of breath.   Cardiovascular: Negative for chest pain, palpitations and leg swelling.  Gastrointestinal: Negative for abdominal distention, abdominal pain, constipation, diarrhea, nausea and vomiting.  Musculoskeletal: Negative.   Skin: Negative.   Neurological: Positive for numbness.  Psychiatric/Behavioral: Negative.     Objective:  Physical Exam Constitutional:      Appearance: He is well-developed. He is obese.  HENT:     Head: Normocephalic and atraumatic.  Neck:     Musculoskeletal: Normal range of motion.  Cardiovascular:     Rate and Rhythm: Normal rate and regular rhythm.  Pulmonary:     Effort: Pulmonary effort is normal. No respiratory distress.     Breath sounds: Normal breath sounds. No wheezing or rales.  Abdominal:     General: Bowel sounds are normal. There is no distension.     Palpations: Abdomen is soft.     Tenderness: There is no abdominal tenderness. There is no rebound.  Musculoskeletal:     Right lower leg: Edema present.     Left lower leg: Edema present.  Skin:    General: Skin is warm and dry.     Comments: Chronic psoriasis  Neurological:     Mental Status: He is alert and oriented to person, place, and time.     Coordination: Coordination normal.     Vitals:   06/11/18 0858 06/11/18 0927  BP: (!) 190/110 (!) 180/110  Pulse: 83   Temp: 97.9 F (36.6 C)   TempSrc: Oral   SpO2: 96%   Weight: (!) 306 lb (138.8 kg)   Height: 6\' 5"  (1.956 m)     Assessment & Plan:

## 2018-06-11 NOTE — Patient Instructions (Addendum)
We have sent in 2 new blood pressure medicines to take.   The first one is called losartan and is 1 pill daily.   The other one is called hydralazine and you need to take 1 pill 3 times per day.   It is really important to take the blood pressure medicines.   We will likely have to stop the metformin depending on the kidney function. We may need you to see a diabetes specialist depending on the blood sugars as it is really important to get them under control.   We need to see you back in 1 month to check on how the new blood pressure medicines are doing.

## 2018-06-11 NOTE — Assessment & Plan Note (Signed)
He is not motivated to make changes today and is having CKD due to diabetes as well as peripheral neuropathy. Advised eye exam. Foot exam done. He admits to take metformin daily but last rx 2 years ago. He will likely need to stop this as creatinine is higher than threshold to stop on prior labs. He is counseled about the serious nature of his uncontrolled diabetes but does not seem interested in changes or control of diabetes. Counseled on risk of heart attack, stroke, death from diabetes.

## 2018-06-11 NOTE — Assessment & Plan Note (Signed)
Flu shot declines. Pneumonia declines. Shingrix declines. Tetanus declines. Colonoscopy wants to get so referral to GI done. Counseled about sun safety and mole surveillance. Counseled about the dangers of distracted driving. Given 10 year screening recommendations.

## 2018-06-11 NOTE — Assessment & Plan Note (Signed)
Previous was much worse and was at stage 4. He did not follow up with treatment and evaluation for this and did not seem concerned about this today. Poorly controlled diabetes and blood pressure. Checking CMP and likely will need to see nephrology.

## 2018-06-11 NOTE — Assessment & Plan Note (Signed)
Admits to take daily but only sent in 6 month supply about 1 year ago. He desires change due to night time nocturia. Will change to losartan 100 mg daily and hydralazine 25 mg TID. Checking CMP and if need for change will adjust.

## 2018-06-14 ENCOUNTER — Other Ambulatory Visit: Payer: Self-pay | Admitting: Internal Medicine

## 2018-06-14 DIAGNOSIS — E1129 Type 2 diabetes mellitus with other diabetic kidney complication: Secondary | ICD-10-CM

## 2018-06-14 DIAGNOSIS — IMO0002 Reserved for concepts with insufficient information to code with codable children: Secondary | ICD-10-CM

## 2018-06-14 DIAGNOSIS — E1165 Type 2 diabetes mellitus with hyperglycemia: Principal | ICD-10-CM

## 2018-06-16 ENCOUNTER — Other Ambulatory Visit: Payer: Self-pay | Admitting: Internal Medicine

## 2018-06-16 MED ORDER — ATORVASTATIN CALCIUM 40 MG PO TABS: 40.0000 mg | ORAL_TABLET | Freq: Every day | ORAL | 3 refills | Status: DC

## 2018-07-23 ENCOUNTER — Ambulatory Visit: Payer: BLUE CROSS/BLUE SHIELD | Admitting: Internal Medicine

## 2018-08-20 ENCOUNTER — Encounter: Payer: Self-pay | Admitting: Internal Medicine

## 2018-08-20 ENCOUNTER — Other Ambulatory Visit: Payer: Self-pay

## 2018-08-20 ENCOUNTER — Ambulatory Visit: Payer: BLUE CROSS/BLUE SHIELD | Admitting: Internal Medicine

## 2018-08-20 VITALS — BP 158/88 | HR 78 | Temp 98.0°F | Ht 77.0 in | Wt 301.8 lb

## 2018-08-20 DIAGNOSIS — N183 Chronic kidney disease, stage 3 (moderate): Secondary | ICD-10-CM

## 2018-08-20 DIAGNOSIS — E1165 Type 2 diabetes mellitus with hyperglycemia: Secondary | ICD-10-CM | POA: Diagnosis not present

## 2018-08-20 DIAGNOSIS — E1122 Type 2 diabetes mellitus with diabetic chronic kidney disease: Secondary | ICD-10-CM | POA: Diagnosis not present

## 2018-08-20 DIAGNOSIS — I10 Essential (primary) hypertension: Secondary | ICD-10-CM | POA: Diagnosis not present

## 2018-08-20 DIAGNOSIS — IMO0002 Reserved for concepts with insufficient information to code with codable children: Secondary | ICD-10-CM

## 2018-08-20 DIAGNOSIS — E1142 Type 2 diabetes mellitus with diabetic polyneuropathy: Secondary | ICD-10-CM | POA: Insufficient documentation

## 2018-08-20 DIAGNOSIS — E1129 Type 2 diabetes mellitus with other diabetic kidney complication: Secondary | ICD-10-CM

## 2018-08-20 DIAGNOSIS — E785 Hyperlipidemia, unspecified: Secondary | ICD-10-CM | POA: Insufficient documentation

## 2018-08-20 LAB — BASIC METABOLIC PANEL
BUN: 23 mg/dL (ref 6–23)
CO2: 28 mEq/L (ref 19–32)
Calcium: 9.7 mg/dL (ref 8.4–10.5)
Chloride: 99 mEq/L (ref 96–112)
Creatinine, Ser: 1.87 mg/dL — ABNORMAL HIGH (ref 0.40–1.50)
GFR: 44.73 mL/min — ABNORMAL LOW (ref >60.00)
Glucose, Bld: 338 mg/dL — ABNORMAL HIGH (ref 70–99)
Potassium: 4.9 mEq/L (ref 3.5–5.1)
Sodium: 137 mEq/L (ref 135–145)

## 2018-08-20 LAB — GLUCOSE, POCT (MANUAL RESULT ENTRY): POC Glucose: 367 mg/dl — AB (ref 70–99)

## 2018-08-20 MED ORDER — GLIPIZIDE 10 MG PO TABS: 10.0000 mg | ORAL_TABLET | Freq: Two times a day (BID) | ORAL | 6 refills | Status: DC

## 2018-08-20 MED ORDER — METFORMIN HCL ER 500 MG PO TB24: 500.0000 mg | ORAL_TABLET | Freq: Two times a day (BID) | ORAL | 6 refills | Status: DC

## 2018-08-20 NOTE — Progress Notes (Signed)
Name: Johnny [name]  MRN/ DOB: [id number], [birth year]   Age/ Sex: 63 y.o., male    PCP: [name], MD   Reason for Endocrinology Evaluation: Type 2 Diabetes Mellitus     Date of Initial Endocrinology Visit: 08/20/2018     PATIENT IDENTIFIER: Johnny [name] is a 63 y.o. male with a past medical history of T2DM, psoriasis and HTN . The patient presented for initial endocrinology clinic visit on 08/20/2018 for consultative assistance with his diabetes management.    HPI: Johnny [name] was    Diagnosed with T2DM since 2012 Prior Medications tried/Intolerance: Metformin, Farxiga, Glimepiride Currently checking blood sugars 0 x / day.  Hypoglycemia episodes : 0              Hemoglobin A1c has ranged from 9.0 % in 2013, peaking at 13.5% in 06/2018. Patient required assistance for hypoglycemia: no Patient has required hospitalization within the last 1 year from hyper or hypoglycemia:   In terms of diet, the patient avoids sugar-sweetened but due to cramps he has been drinking gatorade mixed with water .   Builds gas pumps.   His wife has masters in nutrition   On his last visit to his PCP he was started on Losartan, hydralazine  and atorvastatin. Pt stopped all these meds due to lower extremity edema and a sensation of tightness around his shins.   HOME DIABETES REGIMEN: N/A   Statin: Not taking  ACE-I/ARB: Not taking  Prior Diabetic Education: no   METER DOWNLOAD SUMMARY: Does not check   DIABETIC COMPLICATIONS: Microvascular complications:   Neuropathy, CKD  Denies: retinopathy   Last eye exam: Completed 2018  Macrovascular complications:    Denies: CAD, PVD, CVA   PAST HISTORY: Past Medical History:  Past Medical History:  Diagnosis Date  . Diabetes mellitus   . Guttate psoriasis   . HTN (hypertension)   . Hyperlipidemia    Past Surgical History:  Past Surgical History:  Procedure Laterality Date  . TOOTH EXTRACTION        Social  History:  reports that he is a non-smoker but has been exposed to tobacco smoke. He has never used smokeless tobacco. He reports that he does not drink alcohol or use drugs. Family History:  Family History  Problem Relation Age of Onset  . Hypertension Father        DOB [birth year]  . Hyperlipidemia Father   . Diabetes Father   . Coronary artery disease Father        with stents  . Nephrolithiasis Father   . Kidney disease Father        s/p rejected kidney transplant; on HD  . Memory loss Mother        DOB 50  . Kidney disease Mother        s/p nephrectomy, infection problem  . Dementia Mother   . Cancer Brother        lung  . HIV Brother   . Prostate cancer Neg Hx   . Colon cancer Neg Hx      HOME MEDICATIONS: Allergies as of 08/20/2018   No Known Allergies     Medication List       Accurate as of August 20, 2018 11:41 AM. Always use your most recent med list.        atorvastatin 40 MG tablet Commonly known as:  LIPITOR Take 1 tablet (40 mg total) by mouth daily.   cyclobenzaprine 5 MG tablet  Commonly known as:  FLEXERIL Take 1 tablet (5 mg total) by mouth 3 (three) times daily as needed for muscle spasms.   glipiZIDE 10 MG tablet Commonly known as:  GLUCOTROL Take 1 tablet (10 mg total) by mouth 2 (two) times daily with a meal.   hydrALAZINE 25 MG tablet Commonly known as:  APRESOLINE Take 1 tablet (25 mg total) by mouth 3 (three) times daily.   losartan 100 MG tablet Commonly known as:  COZAAR Take 1 tablet (100 mg total) by mouth daily.   metFORMIN 500 MG 24 hr tablet Commonly known as:  Glucophage XR Take 1 tablet (500 mg total) by mouth 2 (two) times daily.   olmesartan-hydrochlorothiazide 40-12.5 MG tablet Commonly known as:  BENICAR HCT Take 1 tablet by mouth daily.        ALLERGIES: No Known Allergies   REVIEW OF SYSTEMS: A comprehensive ROS was conducted with the patient and is negative except as per HPI and below:  Review of Systems   Constitutional: Negative for fever and weight loss.  HENT: Negative for congestion and sore throat.   Respiratory: Negative for cough and shortness of breath.   Cardiovascular: Negative for chest pain and palpitations.  Gastrointestinal: Negative for diarrhea and nausea.  Genitourinary: Positive for frequency.  Neurological: Positive for tingling. Negative for tremors.  Endo/Heme/Allergies: Positive for polydipsia.  Psychiatric/Behavioral: Negative for depression. The patient is not nervous/anxious.       OBJECTIVE:   VITAL SIGNS: BP (!) 158/88 (BP Location: Right Arm, Patient Position: Sitting, Cuff Size: Large)   Pulse 78   Temp 98 F (36.7 C)   Ht 6\' 5"  (1.956 m)   Wt (!) 301 lb 12.8 oz (136.9 kg)   SpO2 96%   BMI 35.79 kg/m    PHYSICAL EXAM:  General: Pt appears well and is in NAD  Hydration: Well-hydrated with moist mucous membranes and good skin turgor  HEENT: Head: Unremarkable with good dentition. Oropharynx clear without exudate.  Eyes: External eye exam normal without stare, lid lag or exophthalmos.  EOM intact.    Neck: General: Supple without adenopathy or carotid bruits. Thyroid: Thyroid size normal.  No goiter or nodules appreciated. No thyroid bruit.  Lungs: Clear with good BS bilat with no rales, rhonchi, or wheezes  Heart: RRR with normal S1 and S2 and no gallops; no murmurs; no rub  Abdomen: Normoactive bowel sounds, soft, nontender, without masses or organomegaly palpable  Extremities:  Lower extremities -1 +  pretibial edema. Stasis deramatitis noted  Skin: Normal texture and temperature to palpation. Pt has discoid scaly lesions scattered over his trunk. Pt with  Acanthosis nigricans around the neck area.   Neuro: MS is good with appropriate affect, pt is alert and Ox3    DM foot exam:  The skin of the feet is without sores or ulcerations. But has extensive plantar callous formation bilaterally. The pedal pulses are 2+ on right and 2+ on left. The  sensation is decreased to a screening 5.07, 10 gram monofilament bilaterally   DATA REVIEWED:  Lab Results  Component Value Date   HGBA1C 13.5 (H) 06/11/2018   HGBA1C 10.0 (H) 06/10/2017   HGBA1C 12.7 (H) 06/06/2016   Lab Results  Component Value Date   MICROALBUR 14.8 (H) 06/11/2018   LDLCALC 187 (H) 06/11/2018   CREATININE 1.69 (H) 06/11/2018   Lab Results  Component Value Date   MICRALBCREAT 16.3 06/11/2018    Lab Results  Component Value Date   CHOL  256 (H) 06/11/2018   HDL 44.00 06/11/2018   LDLCALC 187 (H) 06/11/2018   LDLDIRECT 170.2 11/19/2012   TRIG 123.0 06/11/2018   CHOLHDL 6 06/11/2018        ASSESSMENT / PLAN / RECOMMENDATIONS:   1) Type 2 Diabetes Mellitus, Poorly controlled, With CKD III and neuropathic complications - Most recent A1c of 13.5 %. Goal A1c < 7.0 %.   Plan: GENERAL: I have discussed with the patient the pathophysiology of diabetes. We went over the natural progression of the disease. We talked about both insulin resistance and insulin deficiency. We stressed the importance of lifestyle changes including diet and exercise. I explained the complications associated with diabetes including retinopathy, nephropathy, neuropathy as well as increased risk of cardiovascular disease. We went over the benefit seen with glycemic control.   I explained to the patient that diabetic patients are at higher than normal risk for amputations. The patient was informed that diabetes is the number one cause of non-traumatic amputations in [place].   Encouraged patient to continue avoiding sugar-sweetened beverages, he was also advised to avoid gatorade and if necessary to use the G0 .   He understands that insulin is the best way to bring his glucose to an optimal level , but in someone who historically has been non-compliant with medication intake and historically lacked motivation, putting him on insulin at this time, I am afraid is going to be overwhelming to  him. We discussed starting him on oral glycemic agents, knowing thiey won;t work as fast , but he also understands that insulin is still an option if oral glycemic agents fail.  We will restart his Metformin, with a GFR > 45 full dose can be continued, if his GFR decreases to < 45 , the dose needs to be adjusted down by 50% and if his GFR falls below 30, then metformin needs to stop.   Unfortunately he is not a good candidate for TZD's due to LE edema, will consider adding SGLT-2 inhibitors in the future if his renal function allows it.   He was encouraged to contact us with hypoglycemia.   He will be referred to our CDE  Pt has glucose meters at home and his sister will help him with it.   MEDICATIONS:  Metformin 500 mg XR 1 tablets BID with meals  Glipizide 10 mg 1 tablets BID with meals  EDUCATION / INSTRUCTIONS:  BG monitoring instructions: Patient is instructed to check his blood sugars 2 times a day, before breakfast and supper .  Call [facility] if: BG persistently < 70 or > 300. . I reviewed the Rule of 15 for the treatment of hypoglycemia in detail with the patient. Literature supplied.   2) Diabetic complications:   Eye: Does not have known diabetic retinopathy. Pt urged to see an ophthalmologist  Neuro/ Feet: Does have known diabetic peripheral neuropathy.  Renal: Patient does have known baseline CKD. He is on an ACEI/ARB at present.   3) Lipids: Patient is has not been taking lipitor 40 mg . He attributes his LE edema to statins. I explained to him that statins reduce his risk of CVA and CAD and could try taking half the dose.    4) Hypertension: This is above goal of < 140/90 mmHg. He has not been taking the most recent prescribed antihypertensives, he is on an old prescription of olmesartan - HCTZ.  I have advised him to contact his PCP to discuss side effect issues. I also  have advised him to stop drinking gatorade as it has sodium which makes  his LE edema and BP worse.   Pt expressed understanding.    F/u in 6 weeks       Signed electronically by: Mack Guise, MD  [facility] End[facility]  [facility] [address] [place], [place] [address] Phone: [phone] FAX: [phone]   CC: [name], [place] [address] Phone: [phone]  Fax: [phone]    Return to Endocrinology clinic as below: Future Appointments  Date Time Provider [place]  10/01/2018  3:00 PM Shamleffer, Melanie Crazier, MD [facility] None

## 2018-08-20 NOTE — Patient Instructions (Addendum)
-   Start Metformin 500 mg XR Twice a day with Breakfast and Supper  - Start Glipizide 10 mg Twice a day with Breakfast and Supper   - Check sugar twice a day (before Breakfast and supper)  - Choose healthy, lower carb lower calorie snacks: toss salad, cooked vegetables, cottage cheese, peanut butter, low fat cheese / string cheese, lower sodium deli meat, tuna salad or chicken salad    HOW TO TREAT LOW BLOOD SUGARS (Blood sugar LESS THAN 70 MG/DL)  Please follow the RULE OF 15 for the treatment of hypoglycemia treatment (when your (blood sugars are less than 70 mg/dL)    STEP 1: Take 15 grams of carbohydrates when your blood sugar is low, which includes:   3-4 GLUCOSE TABS  OR  3-4 OZ OF JUICE OR REGULAR SODA OR  ONE TUBE OF GLUCOSE GEL     STEP 2: RECHECK blood sugar in 15 MINUTES STEP 3: If your blood sugar is still low at the 15 minute recheck --> then, go back to STEP 1 and treat AGAIN with another 15 grams of carbohydrates.

## 2018-08-23 ENCOUNTER — Encounter: Payer: Self-pay | Admitting: Internal Medicine

## 2018-09-02 ENCOUNTER — Ambulatory Visit: Payer: BLUE CROSS/BLUE SHIELD | Admitting: Dietician

## 2018-09-26 ENCOUNTER — Other Ambulatory Visit: Payer: Self-pay | Admitting: Internal Medicine

## 2018-10-01 ENCOUNTER — Ambulatory Visit: Payer: BLUE CROSS/BLUE SHIELD | Admitting: Internal Medicine

## 2018-10-01 ENCOUNTER — Other Ambulatory Visit: Payer: Self-pay

## 2018-10-01 ENCOUNTER — Encounter: Payer: Self-pay | Admitting: Internal Medicine

## 2018-10-01 VITALS — BP 190/120 | HR 93 | Temp 99.3°F | Ht 77.0 in | Wt 311.0 lb

## 2018-10-01 DIAGNOSIS — IMO0002 Reserved for concepts with insufficient information to code with codable children: Secondary | ICD-10-CM

## 2018-10-01 DIAGNOSIS — E1142 Type 2 diabetes mellitus with diabetic polyneuropathy: Secondary | ICD-10-CM

## 2018-10-01 DIAGNOSIS — E1122 Type 2 diabetes mellitus with diabetic chronic kidney disease: Secondary | ICD-10-CM

## 2018-10-01 DIAGNOSIS — E1165 Type 2 diabetes mellitus with hyperglycemia: Secondary | ICD-10-CM | POA: Diagnosis not present

## 2018-10-01 DIAGNOSIS — E1129 Type 2 diabetes mellitus with other diabetic kidney complication: Secondary | ICD-10-CM

## 2018-10-01 DIAGNOSIS — N183 Chronic kidney disease, stage 3 (moderate): Secondary | ICD-10-CM

## 2018-10-01 DIAGNOSIS — I10 Essential (primary) hypertension: Secondary | ICD-10-CM | POA: Diagnosis not present

## 2018-10-01 LAB — POCT GLYCOSYLATED HEMOGLOBIN (HGB A1C): Hemoglobin A1C: 10 % — AB (ref 4.0–5.6)

## 2018-10-01 MED ORDER — GLIPIZIDE 5 MG PO TABS: 5.0000 mg | ORAL_TABLET | Freq: Two times a day (BID) | ORAL | 3 refills | Status: DC

## 2018-10-01 NOTE — Progress Notes (Signed)
Name: Johnny [name]  Age/ Sex: 63 y.o., male   MRN/ DOB: [id number], Sep 22, 1955     PCP: [name], MD   Reason for Endocrinology Evaluation: Type 2 Diabetes Mellitus  Initial Endocrine Consultative Visit: 08/20/2018    PATIENT IDENTIFIER: Johnny [name] is a 63 y.o. male with a past medical history of T2DM, psoriasis and HTN . The patient has followed with Endocrinology clinic since 08/20/2018 for consultative assistance with management of his diabetes.  DIABETIC HISTORY:  Johnny [name] was diagnosed with T2DM in 2012,. He was on Metformin, Farxiga and Glimepiride in the past. He has never been on insulin. His hemoglobin A1c has ranged from 9.0 % in 2013, peaking at 13.5% in 06/2018.  On his initial visit to our clinic his A1c was 13.5%. He was not taking any of his meds. He was restarted on Metformin and glipizide.   SUBJECTIVE:   During the last visit (08/20/2018): A1c 13.5%. Restarted Metformin 500 mg BID and Glipizide 10 mg BID   Today (10/01/2018): Johnny [name] is here for a 6 week follow up on his diabetes.  He checks his blood sugars 2 times daily, preprandial to breakfast and supper. The patient has had hypoglycemic episodes since the last clinic visit, which typically occur few x / week- most often occuring after lunch. The patient is symptomatic with these episodes, with symptoms of jittery sensation . Otherwise, the patient has not required any recent emergency interventions for hypoglycemia and has not had recent hospitalizations secondary to hyper or hypoglycemic episodes.    ROS: As per HPI and as detailed below: Review of Systems  HENT: Negative for congestion and sore throat.   Respiratory: Negative for cough and shortness of breath.   Cardiovascular: Positive for leg swelling. Negative for chest pain.  Gastrointestinal: Negative for diarrhea and nausea.      HOME DIABETES REGIMEN:   Metformin 500 mg XR 1 tablets BID with meals  Glipizide 10 mg 1  tablets BID with meals     METER DOWNLOAD SUMMARY: Unable to download   7 day average 114 mg/dL  14 day average 107 mg/dL  30 day average 97 mg/dL     HISTORY:  Past Medical History:  Past Medical History:  Diagnosis Date  . Diabetes mellitus   . Guttate psoriasis   . HTN (hypertension)   . Hyperlipidemia    Past Surgical History:  Past Surgical History:  Procedure Laterality Date  . TOOTH EXTRACTION      Social History:  reports that he is a non-smoker but has been exposed to tobacco smoke. He has never used smokeless tobacco. He reports that he does not drink alcohol or use drugs. Family History:  Family History  Problem Relation Age of Onset  . Hypertension Father        DOB [birth year]  . Hyperlipidemia Father   . Diabetes Father   . Coronary artery disease Father        with stents  . Nephrolithiasis Father   . Kidney disease Father        s/p rejected kidney transplant; on HD  . Memory loss Mother        DOB 62  . Kidney disease Mother        s/p nephrectomy, infection problem  . Dementia Mother   . Cancer Brother        lung  . HIV Brother   . Prostate cancer Neg Hx   . Colon cancer  Neg Hx      HOME MEDICATIONS: Allergies as of 10/01/2018   No Known Allergies     Medication List       Accurate as of Oct 01, 2018  4:38 PM. If you have any questions, ask your nurse or doctor.        STOP taking these medications   cyclobenzaprine 5 MG tablet Commonly known as:  FLEXERIL Stopped by:  Dorita Sciara, MD   olmesartan-hydrochlorothiazide 40-12.5 MG tablet Commonly known as:  BENICAR HCT Stopped by:  Dorita Sciara, MD     TAKE these medications   atorvastatin 40 MG tablet Commonly known as:  LIPITOR Take 1 tablet (40 mg total) by mouth daily.   glipiZIDE 5 MG tablet Commonly known as:  GLUCOTROL Take 1 tablet (5 mg total) by mouth 2 (two) times daily before a meal. What changed:    medication strength  how much to take   when to take this Changed by:  Dorita Sciara, MD   hydrALAZINE 25 MG tablet Commonly known as:  APRESOLINE Take 1 tablet (25 mg total) by mouth 3 (three) times daily.   losartan 100 MG tablet Commonly known as:  COZAAR TAKE 1 TABLET DAILY   metFORMIN 500 MG 24 hr tablet Commonly known as:  Glucophage XR Take 1 tablet (500 mg total) by mouth 2 (two) times daily.        OBJECTIVE:   Vital Signs: BP (!) 190/120   Pulse 93   Temp 99.3 F (37.4 C)   Ht 6\' 5"  (1.956 m)   Wt (!) 311 lb (141.1 kg)   SpO2 98%   BMI 36.88 kg/m   Wt Readings from Last 3 Encounters:  10/01/18 (!) 311 lb (141.1 kg)  08/20/18 (!) 301 lb 12.8 oz (136.9 kg)  06/11/18 (!) 306 lb (138.8 kg)     Exam: General: Pt appears well and is in NAD  Hydration: Well-hydrated with moist mucous membranes and good skin turgor  HEENT: Head: Unremarkable with good dentition. Oropharynx clear without exudate.   Neck: General: Supple without adenopathy. Thyroid: Thyroid size normal.  No goiter or nodules appreciated. No thyroid bruit.  Lungs: Clear with good BS bilat with no rales, rhonchi, or wheezes  Heart: RRR with normal S1 and S2 and no gallops; no murmurs; no rub  Extremities: 1+ pretibial edema.  Skin: Normal texture and temperature to palpation. No rash noted.  Neuro: MS is good with appropriate affect, pt is alert and Ox3    DM foot exam:08/20/2018 The skin of the feet is without sores or ulcerations. But has extensive plantar callous formation bilaterally. The pedal pulses are 2+ on right and 2+ on left. The sensation is decreased to a screening 5.07, 10 gram monofilament bilaterally           DATA REVIEWED:  Lab Results  Component Value Date   HGBA1C 10.0 (A) 10/01/2018   HGBA1C 13.5 (H) 06/11/2018   HGBA1C 10.0 (H) 06/10/2017   Lab Results  Component Value Date   MICROALBUR 14.8 (H) 06/11/2018   LDLCALC 187 (H) 06/11/2018   CREATININE 1.87 (H) 08/20/2018   Results for Johnny [name] (MRN [id number]) as of 10/04/2018 08:06  Ref. Range 10/01/2018 16:10  Potassium Latest Ref Range: 3.5 - 5.2 mmol/L 4.4   ASSESSMENT / PLAN / RECOMMENDATIONS:   1) Type 2 Diabetes Mellitus, Poorly controlled, With CKD III and neuropathic  complications - Most recent A1c of 10.0 %.  Goal A1c < 7.0 %.  Down from 13.5%  Plan:  - Despite his non-compliance in the past, he has made a tremendous efforts in taking care of his diabetes. I have praised him on medication compliance and glucose checks.  - We provided him with a new onetouch verio meter.  - Unfortunately he is having way too many hypoglycemic episodes, hence will reduce glipizide as below  - Pt encourages to contact us with recurrent hypoglycemia in the future and not to wait until his appointment.    MEDICATIONS:  Continue Metformin 500 mg BID   Decrease Glipizide to 5 mg BID   EDUCATION / INSTRUCTIONS:  BG monitoring instructions: Patient is instructed to check his blood sugars 2 times a day, fasting and bedtime.  Call [facility] if: BG persistently < 70 or > 300. . I reviewed the Rule of 15 for the treatment of hypoglycemia in detail with the patient. Literature supplied.    2) Hypertension: He is above goal of < 140/90 mmHg. He is asymptomatic . He is compliant with taking hydralazine and cozaar. I will check aldo:renin ratio. In the meantime he was asked to reach out to his PCP for further adjustments.    F/U in 3 months    Signed electronically by: Mack Guise, MD  [facility] Endocrinology  [facility] [address] [place], [place] [address] Phone: [phone] FAX: [phone]   CC: [name], [place] [address] Phone: [phone]  Fax: [phone]  Return to Endocrinology clinic as below: Future Appointments  Date Time Provider [place]  12/31/2018  2:40 PM Irisha Grandmaison, Melanie Crazier, MD  [facility] None

## 2018-10-01 NOTE — Patient Instructions (Signed)
-   Continue Metformin 500 mg 1 tablet with Breakfast and 1 with supper - Decrease Glipizide to 5 mg twice a day with meals (Breakfast and Supper), in the meantime you can take half of the glipizide tablet you have at home. The new prescription will be one tablet with Breakfast and one tablet with supper  - Continue to check sugar twice a day (before breakfast and before supper )   - HOW TO TREAT LOW BLOOD SUGARS (Blood sugar LESS THAN 70 MG/DL)  Please follow the RULE OF 15 for the treatment of hypoglycemia treatment (when your (blood sugars are less than 70 mg/dL)    STEP 1: Take 15 grams of carbohydrates when your blood sugar is low, which includes:   3-4 GLUCOSE TABS  OR  3-4 OZ OF JUICE OR REGULAR SODA OR  ONE TUBE OF GLUCOSE GEL     STEP 2: RECHECK blood sugar in 15 MINUTES STEP 3: If your blood sugar is still low at the 15 minute recheck --> then, go back to STEP 1 and treat AGAIN with another 15 grams of carbohydrates.

## 2018-10-02 LAB — POTASSIUM: Potassium: 4.4 mmol/L (ref 3.5–5.2)

## 2018-11-15 ENCOUNTER — Telehealth: Payer: Self-pay | Admitting: Internal Medicine

## 2018-11-15 NOTE — Telephone Encounter (Signed)
Can have a visit as long as symptom free and yes labs are in his chart so Dr. Sharlet Salina can see them

## 2018-11-15 NOTE — Telephone Encounter (Signed)
Patient would like to schedule in office visit to follow up on BP meds and to see if endocrinologist sent lab work over to Dr. Sharlet Salina.  Patient would like to review this with Dr. Sharlet Salina.  Please advise.

## 2018-11-16 ENCOUNTER — Other Ambulatory Visit: Payer: Self-pay

## 2018-11-16 ENCOUNTER — Ambulatory Visit (INDEPENDENT_AMBULATORY_CARE_PROVIDER_SITE_OTHER): Payer: BC Managed Care – PPO | Admitting: Internal Medicine

## 2018-11-16 ENCOUNTER — Encounter: Payer: Self-pay | Admitting: Internal Medicine

## 2018-11-16 ENCOUNTER — Other Ambulatory Visit (INDEPENDENT_AMBULATORY_CARE_PROVIDER_SITE_OTHER): Payer: BC Managed Care – PPO

## 2018-11-16 VITALS — BP 170/100 | HR 79 | Temp 98.4°F | Ht 77.0 in | Wt 310.0 lb

## 2018-11-16 DIAGNOSIS — E1129 Type 2 diabetes mellitus with other diabetic kidney complication: Secondary | ICD-10-CM | POA: Diagnosis not present

## 2018-11-16 DIAGNOSIS — E1165 Type 2 diabetes mellitus with hyperglycemia: Secondary | ICD-10-CM | POA: Diagnosis not present

## 2018-11-16 DIAGNOSIS — IMO0002 Reserved for concepts with insufficient information to code with codable children: Secondary | ICD-10-CM

## 2018-11-16 DIAGNOSIS — I10 Essential (primary) hypertension: Secondary | ICD-10-CM | POA: Diagnosis not present

## 2018-11-16 LAB — COMPREHENSIVE METABOLIC PANEL
ALT: 13 U/L (ref 0–53)
AST: 14 U/L (ref 0–37)
Albumin: 4.3 g/dL (ref 3.5–5.2)
Alkaline Phosphatase: 65 U/L (ref 39–117)
BUN: 14 mg/dL (ref 6–23)
CO2: 29 mEq/L (ref 19–32)
Calcium: 9 mg/dL (ref 8.4–10.5)
Chloride: 106 mEq/L (ref 96–112)
Creatinine, Ser: 1.53 mg/dL — ABNORMAL HIGH (ref 0.40–1.50)
GFR: 56.33 mL/min — ABNORMAL LOW (ref >60.00)
Glucose, Bld: 82 mg/dL (ref 70–99)
Potassium: 3.9 mEq/L (ref 3.5–5.1)
Sodium: 142 mEq/L (ref 135–145)
Total Bilirubin: 0.3 mg/dL (ref 0.2–1.2)
Total Protein: 7.4 g/dL (ref 6.0–8.3)

## 2018-11-16 LAB — CBC
HCT: 41.2 % (ref 39.0–52.0)
Hemoglobin: 13.3 g/dL (ref 13.0–17.0)
MCHC: 32.3 g/dL (ref 30.0–36.0)
MCV: 89.1 fl (ref 78.0–100.0)
Platelets: 253 10*3/uL (ref 150.0–400.0)
RBC: 4.62 Mil/uL (ref 4.22–5.81)
RDW: 14.4 % (ref 11.5–15.5)
WBC: 5.3 10*3/uL (ref 4.0–10.5)

## 2018-11-16 MED ORDER — HYDRALAZINE HCL 50 MG PO TABS: 50.0000 mg | ORAL_TABLET | Freq: Three times a day (TID) | ORAL | 6 refills | Status: DC

## 2018-11-16 NOTE — Assessment & Plan Note (Signed)
Seeing endo and he is doing slightly better although not good as of yet.

## 2018-11-16 NOTE — Telephone Encounter (Signed)
Patient scheduled.

## 2018-11-16 NOTE — Patient Instructions (Addendum)
We have sent hydralazine increased dose to take 50 mg three times a day.   We will keep the losartan the same dose.   Come back in about a month to check the pressure.

## 2018-11-16 NOTE — Assessment & Plan Note (Signed)
Checking CMP and adjust as needed. CKD stage 3 at last check. BP is uncontrolled and compliance is suspect. He was given only 4 months of hydralazine at visit in February and has not run out yet. Will increase hydralazine to 50 mg TID and continue losartan 100 mg daily.

## 2018-11-16 NOTE — Progress Notes (Signed)
   Subjective:   Patient ID: Johnny [name], male    DOB: [birth year], 63 y.o.   MRN: [id number]  HPI The patient is a 63 YO man coming in for follow up of severely uncontrolled blood pressure. Seen back in February with highly uncontrolled diabetes, blood pressure. We did make changes to regimen and he was supposed to return in 1 month. This did not happen. He has been seeing endocrinology and HgA1c has gone from 13 to 10 but is still uncontrolled. He does admit to taking hydralazine and losartan and knows how often a day he takes them. Having some lightheadedness. Denies chest pain or headaches or nausea/vomiting.   Review of Systems  Constitutional: Negative.   HENT: Negative.   Eyes: Negative.   Respiratory: Negative for cough, chest tightness and shortness of breath.   Cardiovascular: Negative for chest pain, palpitations and leg swelling.  Gastrointestinal: Negative for abdominal distention, abdominal pain, constipation, diarrhea, nausea and vomiting.  Musculoskeletal: Negative.   Skin: Negative.   Neurological: Positive for light-headedness.  Psychiatric/Behavioral: Negative.     Objective:  Physical Exam Constitutional:      Appearance: He is well-developed. He is obese.  HENT:     Head: Normocephalic and atraumatic.  Neck:     Musculoskeletal: Normal range of motion.  Cardiovascular:     Rate and Rhythm: Normal rate and regular rhythm.  Pulmonary:     Effort: Pulmonary effort is normal. No respiratory distress.     Breath sounds: Normal breath sounds. No wheezing or rales.  Abdominal:     General: Bowel sounds are normal. There is no distension.     Palpations: Abdomen is soft.     Tenderness: There is no abdominal tenderness. There is no rebound.  Skin:    General: Skin is warm and dry.  Neurological:     Mental Status: He is alert and oriented to person, place, and time.     Coordination: Coordination normal.     Vitals:   11/16/18 1511  BP: (!) 170/100   Pulse: 79  Temp: 98.4 F (36.9 C)  TempSrc: Oral  SpO2: 97%  Weight: (!) 310 lb (140.6 kg)  Height: 6\' 5"  (1.956 m)    Assessment & Plan:  Visit time 25 minutes: greater than 50% of that time was spent in face to face counseling and coordination of care with the patient: counseled about concerns about his health and his exceptionally high CV risk especially without control of his BP and diabetes

## 2018-12-21 ENCOUNTER — Ambulatory Visit: Payer: Self-pay

## 2018-12-21 NOTE — Telephone Encounter (Signed)
Incoming call from patient Requesting that his blood pressure be change back to Previos medication of Benecar.  States this medication works better for him will deal voiding at nitght time.    Answer Assessment - Initial Assessment Questions 1. BLOOD PRESSURE: "What is the blood pressure?" "Did you take at least two measurements 5 minutes apart?"    177/100 2. ONSET: "When did you take your blood pressure?"     0700 this am  3. HOW: "How did you obtain the blood pressure?" (e.g., visiting nurse, automatic home BP monitor)    automatic 4. HISTORY: "Do you have a history of high blood pressure?"     Yes  5. MEDICATIONS: "Are you taking any medications for blood pressure?" "Have you missed any doses recently?"     *No Answer*denies 6. OTHER SYMPTOMS: "Do you have any symptoms?" (e.g., headache, chest pain, blurred vision, difficulty breathing, weakness)    denies feeling light  headed 7. PREGNANCY: "Is there any chance you are pregnant?" "When was your last menstrual period?"    na  Protocols used: HIGH BLOOD PRESSURE-A-AH

## 2018-12-22 NOTE — Telephone Encounter (Signed)
Called patient and informed. He said that he would like to see if his medication could be changed and then have a follow up with Dr Sharlet Salina to check his numbers and see how things are going after changing medications.  He states that in the past, Benicar made his use the bathroom at night but the current medication does not control his blood pressure and he can deal with using the bathroom rather than having a stroke.   Please advise.

## 2018-12-22 NOTE — Telephone Encounter (Signed)
Patient will need a visit to discuss

## 2018-12-24 ENCOUNTER — Telehealth: Payer: Self-pay

## 2018-12-24 NOTE — Telephone Encounter (Signed)
He needs an appt for Monday.  Can increase hydralazine to 100 mg TID until then

## 2018-12-24 NOTE — Telephone Encounter (Signed)
Copied from [place] [id number]. Topic: General - Other >> Dec 24, 2018  2:02 PM Leward Quan A wrote: Reason for CRM: Patient called and requested a call back from the nurse pertaining to his BP states that it is elevated. Upon waking today 190/105 waited and took it again and it was 190/100. Also on 12/23/2018 BP was 179/100. Patient is requesting that Dr Sharlet Salina prescribe the Benicar for him to control his BP. And need a call back at Ph# [phone]  Patient was seen on 11/16/18, dr crawford increased hydralazine dosage and continued with losartan---patient was supposed to come back in one month (around 8/14) to follow up with bp----routing to dr burns, are you ok with adjusting meds today without appt---I dont have any available visit slots here at [facility] office today---or would you rather patient be advised to go to urgent care or ED----please advise, I will call patient back, thanks

## 2018-12-27 NOTE — Telephone Encounter (Signed)
LVM for patient to call back and go over his medications

## 2018-12-27 NOTE — Telephone Encounter (Signed)
Patient advised to increase med dosage per dr burns instructions, he will now be taking 2 50mg  tabs x3 daily, suggested doing at breakfast,lunch and dinner until patient is seen by dr crawford---appt made with her for 8/25 at 3pm---patient repeated back for understanding---has been screened, no sx, given phone number to call when he arrives in parking lot

## 2018-12-27 NOTE — Telephone Encounter (Signed)
Can we verify current meds and how often taking to make sure he is taking meds correctly?

## 2018-12-28 ENCOUNTER — Encounter: Payer: Self-pay | Admitting: Internal Medicine

## 2018-12-28 ENCOUNTER — Other Ambulatory Visit: Payer: Self-pay

## 2018-12-28 ENCOUNTER — Ambulatory Visit (INDEPENDENT_AMBULATORY_CARE_PROVIDER_SITE_OTHER): Payer: BC Managed Care – PPO | Admitting: Internal Medicine

## 2018-12-28 DIAGNOSIS — I10 Essential (primary) hypertension: Secondary | ICD-10-CM

## 2018-12-28 MED ORDER — OLMESARTAN MEDOXOMIL-HCTZ 40-12.5 MG PO TABS: 1.0000 | ORAL_TABLET | Freq: Every day | ORAL | 3 refills | Status: DC

## 2018-12-28 NOTE — Patient Instructions (Addendum)
We will put you back on the benicar/hctz to take 1 pill daily.   Give this 1-2 weeks and then let us know about the blood pressure.

## 2018-12-31 ENCOUNTER — Other Ambulatory Visit: Payer: Self-pay

## 2018-12-31 ENCOUNTER — Encounter: Payer: Self-pay | Admitting: Internal Medicine

## 2018-12-31 ENCOUNTER — Ambulatory Visit (INDEPENDENT_AMBULATORY_CARE_PROVIDER_SITE_OTHER): Payer: BC Managed Care – PPO | Admitting: Internal Medicine

## 2018-12-31 VITALS — BP 144/84 | HR 93 | Temp 98.5°F | Ht 77.0 in | Wt 305.0 lb

## 2018-12-31 DIAGNOSIS — E1122 Type 2 diabetes mellitus with diabetic chronic kidney disease: Secondary | ICD-10-CM

## 2018-12-31 DIAGNOSIS — N183 Chronic kidney disease, stage 3 unspecified: Secondary | ICD-10-CM

## 2018-12-31 LAB — POCT GLYCOSYLATED HEMOGLOBIN (HGB A1C): Hemoglobin A1C: 7 % — AB (ref 4.0–5.6)

## 2018-12-31 MED ORDER — GLIPIZIDE 5 MG PO TABS: 2.5000 mg | ORAL_TABLET | Freq: Two times a day (BID) | ORAL | 1 refills | Status: DC

## 2018-12-31 NOTE — Assessment & Plan Note (Signed)
Stop losartan and hydralazine and resume olmesartan/hctz 40/12.5 mg which was previously managing BP well. Follow up in 2-4 weeks with BP readings and adjust as needed.

## 2018-12-31 NOTE — Patient Instructions (Signed)
-   Continue Metformin 500 mg 1 tablet with Breakfast and 1 tablet with supper - Decrease Glipizide half a tablet with Breakfast and Supper   - If you are going to exercise after supper, DO NOT take Glipizide with Supper that day but take Metformin.    - HOW TO TREAT LOW BLOOD SUGARS (Blood sugar LESS THAN 70 MG/DL)  Please follow the RULE OF 15 for the treatment of hypoglycemia treatment (when your (blood sugars are less than 70 mg/dL)    STEP 1: Take 15 grams of carbohydrates when your blood sugar is low, which includes:   3-4 GLUCOSE TABS  OR  3-4 OZ OF JUICE OR REGULAR SODA OR  ONE TUBE OF GLUCOSE GEL     STEP 2: RECHECK blood sugar in 15 MINUTES STEP 3: If your blood sugar is still low at the 15 minute recheck --> then, go back to STEP 1 and treat AGAIN with another 15 grams of carbohydrates.

## 2018-12-31 NOTE — Progress Notes (Signed)
Name: Johnny [name]  Age/ Sex: 63 y.o., male   MRN/ DOB: [id number], 1955/08/29     PCP: [name], MD   Reason for Endocrinology Evaluation: Type 2 Diabetes Mellitus  Initial Endocrine Consultative Visit: 08/20/2018    PATIENT IDENTIFIER: [name] is a 63 y.o. male with a past medical history of T2DM, psoriasis and HTN . The patient has followed with Endocrinology clinic since 08/20/2018 for consultative assistance with management of his diabetes.  DIABETIC HISTORY:  [name] was diagnosed with T2DM in 2012,. He was on Metformin, Farxiga and Glimepiride in the past. He has never been on insulin. His hemoglobin A1c has ranged from 9.0 % in 2013, peaking at 13.5% in 06/2018.  On his initial visit to our clinic his A1c was 13.5%. He was not taking any of his meds. He was restarted on Metformin and glipizide.   Hyperaldosteronism was ruled out in 09/2018 for extreme HTN  SUBJECTIVE:   During the last visit (5/292020): A1c 10.0%. We continued Metformin and decreased Glipizide due to hypoglycemia .  Today (12/31/2018): [name] is here for a 2 month  follow up on his diabetes.  He checks his blood sugars 2 times daily, preprandial to breakfast and supper. The patient has had hypoglycemic episodes since the last clinic visit, which typically occur few x / week- most often occuring after lunch. The patient is symptomatic with these episodes, with symptoms of jittery sensation . Otherwise, the patient has not required any recent emergency interventions for hypoglycemia and has not had recent hospitalizations secondary to hyper or hypoglycemic episodes.    ROS: As per HPI and as detailed below: Review of Systems  HENT: Negative for congestion and sore throat.   Respiratory: Negative for cough and shortness of breath.   Cardiovascular: Positive for leg swelling. Negative for chest pain.  Gastrointestinal: Negative for diarrhea and nausea.      HOME DIABETES  REGIMEN:   Metformin 500 mg XR 1 tablets BID with meals  Glipizide 5 mg 1 tablets BID with meals  METER DOWNLOAD SUMMARY: 8/15-8/28/20 Fingerstick Blood Glucose Tests = 23 Average Number Tests/Day = 1.6 Overall Mean FS Glucose = 98   BG Ranges: Low = 64 High = 128   Hypoglycemic Events/30 Days: BG < 50 = 0 Episodes of symptomatic severe hypoglycemia = 0      HISTORY:  Past Medical History:  Past Medical History:  Diagnosis Date  . Diabetes mellitus   . Guttate psoriasis   . HTN (hypertension)   . Hyperlipidemia    Past Surgical History:  Past Surgical History:  Procedure Laterality Date  . TOOTH EXTRACTION      Social History:  reports that he is a non-smoker but has been exposed to tobacco smoke. He has never used smokeless tobacco. He reports that he does not drink alcohol or use drugs. Family History:  Family History  Problem Relation Age of Onset  . Hypertension Father        DOB [birth year]  . Hyperlipidemia Father   . Diabetes Father   . Coronary artery disease Father        with stents  . Nephrolithiasis Father   . Kidney disease Father        s/p rejected kidney transplant; on HD  . Memory loss Mother        DOB 57  . Kidney disease Mother        s/p nephrectomy, infection problem  .  Dementia Mother   . Cancer Brother        lung  . HIV Brother   . Prostate cancer Neg Hx   . Colon cancer Neg Hx      HOME MEDICATIONS: Allergies as of 12/31/2018   No Known Allergies     Medication List       Accurate as of December 31, 2018 12:38 PM. If you have any questions, ask your nurse or doctor.        atorvastatin 40 MG tablet Commonly known as: LIPITOR Take 1 tablet (40 mg total) by mouth daily.   glipiZIDE 5 MG tablet Commonly known as: GLUCOTROL Take 1 tablet (5 mg total) by mouth 2 (two) times daily before a meal.   metFORMIN 500 MG 24 hr tablet Commonly known as: Glucophage XR Take 1 tablet (500 mg total) by mouth 2 (two) times daily.    olmesartan-hydrochlorothiazide 40-12.5 MG tablet Commonly known as: Benicar HCT Take 1 tablet by mouth daily.        OBJECTIVE:   Vital Signs: There were no vitals taken for this visit.  Wt Readings from Last 3 Encounters:  12/28/18 (!) 312 lb (141.5 kg)  11/16/18 (!) 310 lb (140.6 kg)  10/01/18 (!) 311 lb (141.1 kg)     Exam: General: Pt appears well and is in NAD  Neck: General: Supple without adenopathy. Thyroid: Thyroid size normal.  No goiter or nodules appreciated. No thyroid bruit.  Lungs: Clear with good BS bilat with no rales, rhonchi, or wheezes  Heart: RRR with normal S1 and S2 and no gallops; no murmurs; no rub  Extremities: Trace pretibial edema.  Skin: Normal texture and temperature to palpation. No rash noted.  Neuro: MS is good with appropriate affect, pt is alert and Ox3    DM foot exam:08/20/2018 The skin of the feet is without sores or ulcerations. But has extensive plantar callous formation bilaterally. The pedal pulses are 2+ on right and 2+ on left. The sensation is decreased to a screening 5.07, 10 gram monofilament bilaterally           DATA REVIEWED:  Lab Results  Component Value Date   HGBA1C 10.0 (A) 10/01/2018   HGBA1C 13.5 (H) 06/11/2018   HGBA1C 10.0 (H) 06/10/2017   Lab Results  Component Value Date   MICROALBUR 14.8 (H) 06/11/2018   LDLCALC 187 (H) 06/11/2018   CREATININE 1.53 (H) 11/16/2018    ASSESSMENT / PLAN / RECOMMENDATIONS:   1) Type 2 Diabetes Mellitus, Optimally controlled, With CKD III and neuropathic  complications - Most recent A1c of 7.0 %. Goal A1c < 7.0 %.  Down from 10.0%  Plan:  - I have congratulated him on his optimal glucose control,he has done a lot of changes over the past few months - Has occasional hypoglycemia, will reduce glipizide as below - He was advised to hold off on taking glipizide with the meal prior to exercise to prevent hypoglycemia    MEDICATIONS:  Continue Metformin 500 mg BID    Decrease Glipizide to 2.5 mg BID   EDUCATION / INSTRUCTIONS:  BG monitoring instructions: Patient is instructed to check his blood sugars 2 times a day, fasting and bedtime.  Call [facility] if: BG persistently < 70 or > 300. . I reviewed the Rule of 15 for the treatment of hypoglycemia in detail with the patient. Literature supplied.   F/U in 3 months    Signed electronically by: Mack Guise, MD  [facility]  [facility] [address] [place], [place] [address] Phone: [phone] FAX: [phone]   CC: [name], [place] [address] Phone: [phone]  Fax: [phone]  Return to Endocrinology clinic as below: Future Appointments  Date Time Provider [place]  12/31/2018  2:40 PM , Melanie Crazier, MD [facility] None

## 2018-12-31 NOTE — Progress Notes (Signed)
   Subjective:   Patient ID: Johnny [name], male    DOB: 1955/10/05, 63 y.o.   MRN: [id number]  HPI The patient is a 63 YO man coming in for concerns about blood pressure. He previously was on benicar/hctz and this was making him urinate a lot at night time. We tried to switch to just ARB and add hydralazine. He had progressive doses of this and taking 100 mg TID currently which is not helping BP well at all. BP is higher than ever before. He is having some mild headaches. Denies chest pains. Denies abdominal pain or nausea/vomiting.   Review of Systems  Constitutional: Negative.   HENT: Negative.   Eyes: Negative.   Respiratory: Negative for cough, chest tightness and shortness of breath.   Cardiovascular: Negative for chest pain, palpitations and leg swelling.  Gastrointestinal: Negative for abdominal distention, abdominal pain, constipation, diarrhea, nausea and vomiting.  Musculoskeletal: Negative.   Skin: Negative.   Neurological: Positive for headaches.  Psychiatric/Behavioral: Negative.     Objective:  Physical Exam Constitutional:      Appearance: He is well-developed. He is obese.  HENT:     Head: Normocephalic and atraumatic.  Neck:     Musculoskeletal: Normal range of motion.  Cardiovascular:     Rate and Rhythm: Normal rate and regular rhythm.  Pulmonary:     Effort: Pulmonary effort is normal. No respiratory distress.     Breath sounds: Normal breath sounds. No wheezing or rales.  Abdominal:     General: Bowel sounds are normal. There is no distension.     Palpations: Abdomen is soft.     Tenderness: There is no abdominal tenderness. There is no rebound.  Skin:    General: Skin is warm and dry.  Neurological:     Mental Status: He is alert and oriented to person, place, and time.     Coordination: Coordination normal.     Vitals:   12/28/18 1451  BP: (!) 180/100  Pulse: 86  Temp: 98.4 F (36.9 C)  TempSrc: Oral  SpO2: 96%  Weight: (!) 312 lb (141.5  kg)  Height: 6\' 5"  (1.956 m)    Assessment & Plan:

## 2019-01-03 DIAGNOSIS — E1122 Type 2 diabetes mellitus with diabetic chronic kidney disease: Secondary | ICD-10-CM | POA: Insufficient documentation

## 2019-03-23 ENCOUNTER — Other Ambulatory Visit: Payer: Self-pay

## 2019-03-25 ENCOUNTER — Encounter: Payer: Self-pay | Admitting: Internal Medicine

## 2019-03-25 ENCOUNTER — Ambulatory Visit: Payer: BC Managed Care – PPO | Admitting: Internal Medicine

## 2019-03-25 ENCOUNTER — Other Ambulatory Visit: Payer: Self-pay

## 2019-03-25 VITALS — BP 142/90 | HR 86 | Ht 77.0 in | Wt 321.4 lb

## 2019-03-25 DIAGNOSIS — E1129 Type 2 diabetes mellitus with other diabetic kidney complication: Secondary | ICD-10-CM | POA: Diagnosis not present

## 2019-03-25 DIAGNOSIS — E1165 Type 2 diabetes mellitus with hyperglycemia: Secondary | ICD-10-CM

## 2019-03-25 DIAGNOSIS — E1142 Type 2 diabetes mellitus with diabetic polyneuropathy: Secondary | ICD-10-CM | POA: Diagnosis not present

## 2019-03-25 DIAGNOSIS — IMO0002 Reserved for concepts with insufficient information to code with codable children: Secondary | ICD-10-CM

## 2019-03-25 DIAGNOSIS — E1121 Type 2 diabetes mellitus with diabetic nephropathy: Secondary | ICD-10-CM

## 2019-03-25 DIAGNOSIS — E1122 Type 2 diabetes mellitus with diabetic chronic kidney disease: Secondary | ICD-10-CM

## 2019-03-25 DIAGNOSIS — N1831 Chronic kidney disease, stage 3a: Secondary | ICD-10-CM

## 2019-03-25 LAB — POCT GLYCOSYLATED HEMOGLOBIN (HGB A1C): Hemoglobin A1C: 8 % — AB (ref 4.0–5.6)

## 2019-03-25 NOTE — Progress Notes (Signed)
Name: Johnny [name]  Age/ Sex: 63 y.o., male   MRN/ DOB: [id number], 1955-10-10     PCP: [name], MD   Reason for Endocrinology Evaluation: Type 2 Diabetes Mellitus  Initial Endocrine Consultative Visit: 08/20/2018    PATIENT IDENTIFIER: Johnny [name] is a 63 y.o. male with a past medical history of T2DM, psoriasis and HTN . The patient has followed with Endocrinology clinic since 08/20/2018 for consultative assistance with management of his diabetes.  DIABETIC HISTORY:  Johnny [name] was diagnosed with T2DM in 2012,. He was on Metformin,Farxiga and Glimepiride in the past. He has never been on insulin. His hemoglobin A1c has ranged from 9.0 % in 2013, peaking at 13.5% in 06/2018.  On his initial visit to our clinic his A1c was 13.5%. He was not taking any of his meds. He was restarted on Metformin and glipizide.   Hyperaldosteronism was ruled out in 09/2018 for extreme HTN  SUBJECTIVE:   During the last visit (12/31/2018): A1c 7.0 %. We continued Metformin and decreased Glipizide due to hypoglycemia .  Today (03/25/2019): Johnny [name] is here for a 3 month  follow up on his diabetes.  He checks his blood sugars 1 times daily, preprandial to breakfast and supper. The patient has had hypoglycemic episodes since the last clinic visit, which typically occur few x / week- most often occuring after lunch. The patient is symptomatic with these episodes, with symptoms of jittery sensation . Otherwise, the patient has not required any recent emergency interventions for hypoglycemia and has not had recent hospitalizations secondary to hyper or hypoglycemic episodes.    ROS: As per HPI and as detailed below: Review of Systems  HENT: Negative for congestion and sore throat.   Respiratory: Negative for cough and shortness of breath.   Cardiovascular: Positive for leg swelling. Negative for chest pain.  Gastrointestinal: Negative for diarrhea and nausea.      HOME DIABETES  REGIMEN:   Metformin 500 mg XR 1 tablets BID with meals  Glipizide 2.5 mg 1 tablets BID with meals   METER DOWNLOAD SUMMARY: 11/14- 03/25/2019 Fingerstick Blood Glucose Tests = 7 Average Number Tests/Day = 1.0 Overall Mean FS Glucose = 131   BG Ranges: Low = 101 High = 161   Hypoglycemic Events/30 Days: BG < 50 = 0 Episodes of symptomatic severe hypoglycemia = 0      HISTORY:  Past Medical History:  Past Medical History:  Diagnosis Date  . Diabetes mellitus   . Guttate psoriasis   . HTN (hypertension)   . Hyperlipidemia    Past Surgical History:  Past Surgical History:  Procedure Laterality Date  . TOOTH EXTRACTION      Social History:  reports that he is a non-smoker but has been exposed to tobacco smoke. He has never used smokeless tobacco. He reports that he does not drink alcohol or use drugs. Family History:  Family History  Problem Relation Age of Onset  . Hypertension Father        DOB [birth year]  . Hyperlipidemia Father   . Diabetes Father   . Coronary artery disease Father        with stents  . Nephrolithiasis Father   . Kidney disease Father        s/p rejected kidney transplant; on HD  . Memory loss Mother        DOB 18  . Kidney disease Mother        s/p nephrectomy, infection problem  .  Dementia Mother   . Cancer Brother        lung  . HIV Brother   . Prostate cancer Neg Hx   . Colon cancer Neg Hx      HOME MEDICATIONS: Allergies as of 03/25/2019   No Known Allergies     Medication List       Accurate as of March 25, 2019  2:47 PM. If you have any questions, ask your nurse or doctor.        atorvastatin 40 MG tablet Commonly known as: LIPITOR Take 1 tablet (40 mg total) by mouth daily.   glipiZIDE 5 MG tablet Commonly known as: GLUCOTROL Take 0.5 tablets (2.5 mg total) by mouth 2 (two) times daily before a meal.   metFORMIN 500 MG 24 hr tablet Commonly known as: Glucophage XR Take 1 tablet (500 mg total) by mouth 2  (two) times daily.   olmesartan-hydrochlorothiazide 40-12.5 MG tablet Commonly known as: Benicar HCT Take 1 tablet by mouth daily.        OBJECTIVE:   Vital Signs: BP (!) 142/90   Pulse 86   Ht 6\' 5"  (1.956 m)   Wt (!) 321 lb 6.4 oz (145.8 kg)   SpO2 97%   BMI 38.11 kg/m   Wt Readings from Last 3 Encounters:  03/25/19 (!) 321 lb 6.4 oz (145.8 kg)  12/31/18 (!) 305 lb (138.3 kg)  12/28/18 (!) 312 lb (141.5 kg)     Exam: General: Pt appears well and is in NAD  Neck: General: Supple without adenopathy. Thyroid: Thyroid size normal.  No goiter or nodules appreciated. No thyroid bruit.  Lungs: Clear with good BS bilat with no rales, rhonchi, or wheezes  Heart: RRR with normal S1 and S2 and no gallops; no murmurs; no rub  Extremities: Trace pretibial edema.  Skin: Normal texture and temperature to palpation. No rash noted.  Neuro: MS is good with appropriate affect, pt is alert and Ox3    DM foot exam: 03/25/2019 The skin of the feet is without sores or ulcerations. But has extensive plantar callous formation bilaterally. The pedal pulses are 2+ on right and 2+ on left. The sensation is decreased to a screening 5.07, 10 gram monofilament bilaterally           DATA REVIEWED:  Lab Results  Component Value Date   HGBA1C 8.0 (A) 03/25/2019   HGBA1C 7.0 (A) 12/31/2018   HGBA1C 10.0 (A) 10/01/2018   Lab Results  Component Value Date   MICROALBUR 14.8 (H) 06/11/2018   LDLCALC 187 (H) 06/11/2018   CREATININE 1.53 (H) 11/16/2018    ASSESSMENT / PLAN / RECOMMENDATIONS:   1) Type 2 Diabetes Mellitus, Optimally controlled, With CKD III and neuropathic  complications - Most recent A1c of 8.0 %. Goal A1c < 7.0 %.  Down from 10.0%   - Unfortunately his A1c has increased. Pt admits to stopping exercise as he has been working a lot of hours. He continues to be mindful of his diet.  - We discussed adding an SGLT-2 inhibitors vs GLp-1 agonists to improve his weight but at  this time he opted to increase Glipizide dose.  - He was advised to hold off on taking glipizide with the meal prior to exercise to prevent hypoglycemia    MEDICATIONS:  Continue Metformin 500 mg BID   Increase Glipizide 5 mg, 1 Tablet before Breakfast and Half a tablet before supper   EDUCATION / INSTRUCTIONS:  BG monitoring instructions: Patient is  instructed to check his blood sugars 2 times a day, fasting and bedtime.  Call [facility] if: BG persistently < 70 or > 300. . I reviewed the Rule of 15 for the treatment of hypoglycemia in detail with the patient. Literature supplied.   F/U in 3 months    Signed electronically by: Mack Guise, MD  [facility]  [facility] [address] [place], [place] [address] Phone: [phone] FAX: [phone]   CC: [name], [place] [address] Phone: [phone]  Fax: [phone]  Return to Endocrinology clinic as below: No future appointments.

## 2019-03-25 NOTE — Patient Instructions (Signed)
-   Continue Metformin 500 mg 1 tablet with Breakfast and 1 tablet with supper -  Glipizide 5 mg, 1  tablet before Breakfast and HALF a tablet before Supper    - If you are going to exercise after supper, DO NOT take Glipizide with Supper that day but take Metformin.    - HOW TO TREAT LOW BLOOD SUGARS (Blood sugar LESS THAN 70 MG/DL)  Please follow the RULE OF 15 for the treatment of hypoglycemia treatment (when your (blood sugars are less than 70 mg/dL)    STEP 1: Take 15 grams of carbohydrates when your blood sugar is low, which includes:   3-4 GLUCOSE TABS  OR  3-4 OZ OF JUICE OR REGULAR SODA OR  ONE TUBE OF GLUCOSE GEL     STEP 2: RECHECK blood sugar in 15 MINUTES STEP 3: If your blood sugar is still low at the 15 minute recheck --> then, go back to STEP 1 and treat AGAIN with another 15 grams of carbohydrates.

## 2019-03-26 ENCOUNTER — Other Ambulatory Visit: Payer: Self-pay | Admitting: Internal Medicine

## 2019-03-28 DIAGNOSIS — E1142 Type 2 diabetes mellitus with diabetic polyneuropathy: Secondary | ICD-10-CM | POA: Insufficient documentation

## 2019-03-28 DIAGNOSIS — E1165 Type 2 diabetes mellitus with hyperglycemia: Secondary | ICD-10-CM | POA: Insufficient documentation

## 2019-06-17 ENCOUNTER — Other Ambulatory Visit: Payer: Self-pay | Admitting: Internal Medicine

## 2019-06-29 ENCOUNTER — Other Ambulatory Visit: Payer: Self-pay

## 2019-07-01 ENCOUNTER — Other Ambulatory Visit: Payer: Self-pay

## 2019-07-01 ENCOUNTER — Ambulatory Visit (INDEPENDENT_AMBULATORY_CARE_PROVIDER_SITE_OTHER): Payer: BC Managed Care – PPO | Admitting: Internal Medicine

## 2019-07-01 ENCOUNTER — Encounter: Payer: Self-pay | Admitting: Internal Medicine

## 2019-07-01 VITALS — BP 152/92 | HR 120 | Temp 98.6°F | Ht 77.0 in | Wt 325.4 lb

## 2019-07-01 DIAGNOSIS — E1129 Type 2 diabetes mellitus with other diabetic kidney complication: Secondary | ICD-10-CM

## 2019-07-01 DIAGNOSIS — E1165 Type 2 diabetes mellitus with hyperglycemia: Secondary | ICD-10-CM | POA: Diagnosis not present

## 2019-07-01 DIAGNOSIS — I4892 Unspecified atrial flutter: Secondary | ICD-10-CM | POA: Diagnosis not present

## 2019-07-01 DIAGNOSIS — IMO0002 Reserved for concepts with insufficient information to code with codable children: Secondary | ICD-10-CM

## 2019-07-01 LAB — POCT GLYCOSYLATED HEMOGLOBIN (HGB A1C): Hemoglobin A1C: 8.7 % — AB (ref 4.0–5.6)

## 2019-07-01 MED ORDER — CARVEDILOL 6.25 MG PO TABS: 6.2500 mg | ORAL_TABLET | Freq: Two times a day (BID) | ORAL | 3 refills | Status: DC

## 2019-07-01 NOTE — Progress Notes (Signed)
Name: Johnny [name]  Age/ Sex: 64 y.o., male   MRN/ DOB: [id number], [birth year]     PCP: [name], MD   Reason for Endocrinology Evaluation: Type 2 Diabetes Mellitus  Initial Endocrine Consultative Visit: 08/20/2018    [name] IDENTIFIER: Johnny [name] is a 64 y.o. male with a past medical history of T2DM, psoriasis and HTN . Johnny [name] has followed with Endocrinology clinic since 08/20/2018 for consultative assistance with management of his diabetes.  DIABETIC HISTORY:  Johnny [name] was diagnosed with T2DM in 2012,. He was on Metformin,Farxiga and Glimepiride in Johnny past. He has never been on insulin. His hemoglobin A1c has ranged from 9.0 % in 2013, peaking at 13.5% in 06/2018.  On his initial visit to our clinic his A1c was 13.5%. He was not taking any of his meds. He was restarted on Metformin and glipizide.   Hyperaldosteronism was ruled out in 09/2018 for extreme HTN  SUBJECTIVE:   During Johnny last visit (12/31/2018): A1c 7.0 %. We continued Metformin and decreased Glipizide due to hypoglycemia .  Today (07/04/2019): Johnny [name] is here for a 3 month  follow up on his diabetes.  He checks his blood sugars 2 times daily, preprandial to breakfast and supper. Johnny [name] has not had hypoglycemic episodes since Johnny last clinic visit. Otherwise, Johnny [name] has not required any recent emergency interventions for hypoglycemia and has not had recent hospitalizations secondary to hyper or hypoglycemic episodes.    ROS: As per HPI and as detailed below: Review of Systems  HENT: Negative for congestion and sore throat.   Respiratory: Positive for cough. Negative for shortness of breath.   Cardiovascular: Positive for leg swelling. Negative for chest pain.  Gastrointestinal: Negative for diarrhea and nausea.     HOME DIABETES REGIMEN:   Metformin 500 mg XR 1 tablets BID with meals  Glipizide 5 mg,  1 tablets BID with meals   METER DOWNLOAD SUMMARY:  1/28-2/26/2021 Fingerstick Blood Glucose Tests = 29 Average Number Tests/Day = 1.0 Overall Mean FS Glucose = 146   BG Ranges: Low = 89 High = 215   Hypoglycemic Events/30 Days: BG < 50 = 0 Episodes of symptomatic severe hypoglycemia = 0     HISTORY:  Past Medical History:  Past Medical History:  Diagnosis Date  . Diabetes mellitus   . Guttate psoriasis   . HTN (hypertension)   . Hyperlipidemia    Past Surgical History:  Past Surgical History:  Procedure Laterality Date  . TOOTH EXTRACTION      Social History:  reports that he is a non-smoker but has been exposed to tobacco smoke. He has never used smokeless tobacco. He reports that he does not drink alcohol or use drugs. Family History:  Family History  Problem Relation Age of Onset  . Hypertension Father        DOB [birth year]  . Hyperlipidemia Father   . Diabetes Father   . Coronary artery disease Father        with stents  . Nephrolithiasis Father   . Kidney disease Father        s/p rejected kidney transplant; on HD  . Memory loss Mother        DOB 34  . Kidney disease Mother        s/p nephrectomy, infection problem  . Dementia Mother   . Cancer Brother        lung  . HIV Brother   . Prostate  cancer Neg Hx   . Colon cancer Neg Hx      HOME MEDICATIONS: Allergies as of 07/01/2019   No Known Allergies     Medication List       Accurate as of July 01, 2019 11:59 PM. If you have any questions, ask your nurse or doctor.        atorvastatin 40 MG tablet Commonly known as: LIPITOR Take 1 tablet (40 mg total) by mouth daily.   carvedilol 6.25 MG tablet Commonly known as: COREG Take 1 tablet (6.25 mg total) by mouth 2 (two) times daily with a meal. Started by: Dorita Sciara, MD   glipiZIDE 5 MG tablet Commonly known as: GLUCOTROL Take 0.5 tablets (2.5 mg total) by mouth 2 (two) times daily before a meal.   metFORMIN 500 MG 24 hr tablet Commonly known as: GLUCOPHAGE-XR TAKE 1  TABLET BY MOUTH TWICE A DAY   olmesartan-hydrochlorothiazide 40-12.5 MG tablet Commonly known as: Benicar HCT Take 1 tablet by mouth daily.        OBJECTIVE:   Vital Signs: BP (!) 152/92 (BP Location: Left Arm, [name] Position: Sitting, Cuff Size: Large)   Pulse (!) 120   Temp 98.6 F (37 C)   Ht 6\' 5"  (1.956 m)   Wt (!) 325 lb 6.4 oz (147.6 kg)   SpO2 97%   BMI 38.59 kg/m   Wt Readings from Last 3 Encounters:  07/01/19 (!) 325 lb 6.4 oz (147.6 kg)  03/25/19 (!) 321 lb 6.4 oz (145.8 kg)  12/31/18 (!) 305 lb (138.3 kg)     Exam: General: Pt appears well and is in NAD  Neck: General: Supple without adenopathy. Thyroid: Thyroid size normal.  No goiter or nodules appreciated. No thyroid bruit.  Lungs: Clear with good BS bilat with no rales, rhonchi, or wheezes  Heart: RRR with normal S1 and S2 and no gallops; no murmurs; no rub  Extremities: 1+ pretibial edema.  Skin: Normal texture and temperature to palpation. No rash noted.  Neuro: MS is good with appropriate affect, pt is alert and Ox3    DM foot exam: 03/25/2019 Johnny skin of Johnny feet is without sores or ulcerations. But has extensive plantar callous formation bilaterally. Johnny pedal pulses are 2+ on right and 2+ on left. Johnny sensation is decreased to a screening 5.07, 10 gram monofilament bilaterally           DATA REVIEWED:  Lab Results  Component Value Date   HGBA1C 8.7 (A) 07/01/2019   HGBA1C 8.0 (A) 03/25/2019   HGBA1C 7.0 (A) 12/31/2018   Lab Results  Component Value Date   MICROALBUR 14.8 (H) 06/11/2018   LDLCALC 187 (H) 06/11/2018   CREATININE 1.53 (H) 11/16/2018    ASSESSMENT / PLAN / RECOMMENDATIONS:   1) Type 2 Diabetes Mellitus, Optimally controlled, With CKD III and neuropathic  complications - Most recent A1c of 8.7 %. Goal A1c < 7.0 %.  Down from 10.0%   - Pt continues with hyperglycemia, this is due to dietary indiscretions and lack of exercise, he has increased his evening glipizide  dose to 1 full tablet.  - In review of his meter download from last month his average glucose has been 146 mg/dL which is discordant with an A1c 8.7 % but  Pt admits he has done better for Johnny past month , then Johnny prior month which explains better glucose control on meter download.  -We discussed add-on therapy such as a GLP-1 agonist to  improve with weight loss, versus increasing glipizide, [name] is reluctant to make any changes today, he would prepare to work on lifestyle changes [name] understands that by next visit if his A1c is not at goal we will have to do something about his current regimen.  MEDICATIONS:  Continue Metformin 500 mg BID   Increase Glipizide 5 mg, 1 Tablet before Breakfast and 1 tablet before supper   EDUCATION / INSTRUCTIONS:  BG monitoring instructions: [name] is instructed to check his blood sugars 2 times a day, fasting and bedtime.  Call [facility] if: BG persistently < 70 or > 300. . I reviewed Johnny Rule of 15 for Johnny treatment of hypoglycemia in detail with Johnny [name]. Literature supplied.  2.  Tachycardia  -[name] noted with tachycardia during his office visit, with heart rate of 140 bpm, [name] is asymptomatic at this time.  An EKG shows a possible atrial flutter. -[name] tells me he has been diagnosed with a cardiac arrhythmia many years ago, [name] was started on a beta-blocker to control heart rate, he will be referred to cardiology for further evaluation, [name] understands he is a high risk for cardiovascular events due to hypertension and diabetes. -He was advised to avoid any kind exercise until he is seen by cardiology -[name] advised to report to Johnny ED should he notice shortness of breath, chest pain or heaviness, or dizziness    F/U in 3 months    Signed electronically by: Mack Guise, MD  [facility]  [facility] [address] [place], [place] [address] Phone:  [phone] FAX: [phone]   CC: [name], [place] [address] Phone: [phone]  Fax: [phone]  Return to Endocrinology clinic as below: Future Appointments  Date Time Provider Clifford  07/14/2019  8:20 AM Sueanne Margarita, MD [facility]  10/07/2019  3:40 PM Dicie Edelen, Melanie Crazier, MD [facility] None

## 2019-07-01 NOTE — Patient Instructions (Addendum)
-   Continue Metformin 500 mg 1 tablet with Breakfast and 1 tablet with supper - Glipizide 5 mg, 1  tablet before Breakfast and 1 tablet before Supper   - Start Coreg 6.25 mg twice daily    - If you notice dizziness, sob, chest tightness or heaviness please report to the emergency room immediately   DO NOT EXERCISE until you see the heart doctor first       - HOW TO TREAT LOW BLOOD SUGARS (Blood sugar LESS THAN 70 MG/DL)  Please follow the RULE OF 15 for the treatment of hypoglycemia treatment (when your (blood sugars are less than 70 mg/dL)    STEP 1: Take 15 grams of carbohydrates when your blood sugar is low, which includes:   3-4 GLUCOSE TABS  OR  3-4 OZ OF JUICE OR REGULAR SODA OR  ONE TUBE OF GLUCOSE GEL     STEP 2: RECHECK blood sugar in 15 MINUTES STEP 3: If your blood sugar is still low at the 15 minute recheck --> then, go back to STEP 1 and treat AGAIN with another 15 grams of carbohydrates.

## 2019-07-11 ENCOUNTER — Other Ambulatory Visit: Payer: Self-pay | Admitting: Internal Medicine

## 2019-07-14 ENCOUNTER — Ambulatory Visit: Payer: BC Managed Care – PPO | Admitting: Cardiology

## 2019-07-14 ENCOUNTER — Encounter: Payer: Self-pay | Admitting: Cardiology

## 2019-07-14 ENCOUNTER — Other Ambulatory Visit (INDEPENDENT_AMBULATORY_CARE_PROVIDER_SITE_OTHER): Payer: BC Managed Care – PPO | Admitting: *Deleted

## 2019-07-14 ENCOUNTER — Encounter (HOSPITAL_COMMUNITY): Payer: Self-pay | Admitting: Cardiology

## 2019-07-14 ENCOUNTER — Other Ambulatory Visit: Payer: Self-pay

## 2019-07-14 ENCOUNTER — Inpatient Hospital Stay (HOSPITAL_COMMUNITY)
Admission: AD | Admit: 2019-07-14 | Discharge: 2019-07-19 | DRG: 308 | Disposition: A | Discharge status: Home / Self Care | Payer: BC Managed Care – PPO | Source: Ambulatory Visit | Attending: Cardiology | Admitting: Cardiology

## 2019-07-14 ENCOUNTER — Telehealth: Payer: Self-pay | Admitting: Internal Medicine

## 2019-07-14 ENCOUNTER — Inpatient Hospital Stay (HOSPITAL_BASED_OUTPATIENT_CLINIC_OR_DEPARTMENT_OTHER): Payer: BC Managed Care – PPO

## 2019-07-14 VITALS — BP 160/92 | HR 134 | Ht 77.0 in | Wt 330.2 lb

## 2019-07-14 DIAGNOSIS — E1122 Type 2 diabetes mellitus with diabetic chronic kidney disease: Secondary | ICD-10-CM | POA: Diagnosis present

## 2019-07-14 DIAGNOSIS — Z20822 Contact with and (suspected) exposure to covid-19: Secondary | ICD-10-CM | POA: Diagnosis present

## 2019-07-14 DIAGNOSIS — E785 Hyperlipidemia, unspecified: Secondary | ICD-10-CM | POA: Diagnosis present

## 2019-07-14 DIAGNOSIS — I483 Typical atrial flutter: Secondary | ICD-10-CM | POA: Diagnosis present

## 2019-07-14 DIAGNOSIS — N1831 Chronic kidney disease, stage 3a: Secondary | ICD-10-CM | POA: Diagnosis present

## 2019-07-14 DIAGNOSIS — Z833 Family history of diabetes mellitus: Secondary | ICD-10-CM

## 2019-07-14 DIAGNOSIS — I10 Essential (primary) hypertension: Secondary | ICD-10-CM | POA: Diagnosis not present

## 2019-07-14 DIAGNOSIS — E119 Type 2 diabetes mellitus without complications: Secondary | ICD-10-CM | POA: Diagnosis not present

## 2019-07-14 DIAGNOSIS — I441 Atrioventricular block, second degree: Secondary | ICD-10-CM | POA: Diagnosis present

## 2019-07-14 DIAGNOSIS — Z841 Family history of disorders of kidney and ureter: Secondary | ICD-10-CM

## 2019-07-14 DIAGNOSIS — Z9119 Patient's noncompliance with other medical treatment and regimen: Secondary | ICD-10-CM

## 2019-07-14 DIAGNOSIS — Z91199 Patient's noncompliance with other medical treatment and regimen due to unspecified reason: Secondary | ICD-10-CM

## 2019-07-14 DIAGNOSIS — E1169 Type 2 diabetes mellitus with other specified complication: Secondary | ICD-10-CM | POA: Diagnosis present

## 2019-07-14 DIAGNOSIS — I13 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 1 through stage 4 chronic kidney disease, or unspecified chronic kidney disease: Secondary | ICD-10-CM | POA: Diagnosis present

## 2019-07-14 DIAGNOSIS — Z6839 Body mass index (BMI) 39.0-39.9, adult: Secondary | ICD-10-CM

## 2019-07-14 DIAGNOSIS — E7800 Pure hypercholesterolemia, unspecified: Secondary | ICD-10-CM | POA: Diagnosis not present

## 2019-07-14 DIAGNOSIS — I4892 Unspecified atrial flutter: Secondary | ICD-10-CM

## 2019-07-14 DIAGNOSIS — I081 Rheumatic disorders of both mitral and tricuspid valves: Secondary | ICD-10-CM | POA: Diagnosis present

## 2019-07-14 DIAGNOSIS — Z8249 Family history of ischemic heart disease and other diseases of the circulatory system: Secondary | ICD-10-CM

## 2019-07-14 DIAGNOSIS — I5043 Acute on chronic combined systolic (congestive) and diastolic (congestive) heart failure: Secondary | ICD-10-CM | POA: Diagnosis present

## 2019-07-14 DIAGNOSIS — Z79899 Other long term (current) drug therapy: Secondary | ICD-10-CM

## 2019-07-14 DIAGNOSIS — I429 Cardiomyopathy, unspecified: Secondary | ICD-10-CM | POA: Diagnosis present

## 2019-07-14 DIAGNOSIS — Z7722 Contact with and (suspected) exposure to environmental tobacco smoke (acute) (chronic): Secondary | ICD-10-CM | POA: Diagnosis present

## 2019-07-14 DIAGNOSIS — I959 Hypotension, unspecified: Secondary | ICD-10-CM | POA: Diagnosis not present

## 2019-07-14 DIAGNOSIS — Z7984 Long term (current) use of oral hypoglycemic drugs: Secondary | ICD-10-CM

## 2019-07-14 DIAGNOSIS — Z818 Family history of other mental and behavioral disorders: Secondary | ICD-10-CM

## 2019-07-14 DIAGNOSIS — Z809 Family history of malignant neoplasm, unspecified: Secondary | ICD-10-CM

## 2019-07-14 LAB — COMPREHENSIVE METABOLIC PANEL
ALT: 51 U/L — ABNORMAL HIGH (ref 0–44)
AST: 27 U/L (ref 15–41)
Albumin: 3.6 g/dL (ref 3.5–5.0)
Alkaline Phosphatase: 60 U/L (ref 38–126)
Anion gap: 9 (ref 5–15)
BUN: 16 mg/dL (ref 8–23)
CO2: 27 mmol/L (ref 22–32)
Calcium: 9 mg/dL (ref 8.9–10.3)
Chloride: 106 mmol/L (ref 98–111)
Creatinine, Ser: 1.78 mg/dL — ABNORMAL HIGH (ref 0.61–1.24)
GFR calc Af Amer: 47 mL/min — ABNORMAL LOW (ref >60)
GFR calc non Af Amer: 40 mL/min — ABNORMAL LOW (ref >60)
Glucose, Bld: 141 mg/dL — ABNORMAL HIGH (ref 70–99)
Potassium: 4.3 mmol/L (ref 3.5–5.1)
Sodium: 142 mmol/L (ref 135–145)
Total Bilirubin: 0.9 mg/dL (ref 0.3–1.2)
Total Protein: 6.7 g/dL (ref 6.5–8.1)

## 2019-07-14 LAB — ECHOCARDIOGRAM COMPLETE
Height: 77 in
Weight: 5243.2 oz

## 2019-07-14 LAB — GLUCOSE, CAPILLARY
Glucose-Capillary: 126 mg/dL — ABNORMAL HIGH (ref 70–99)
Glucose-Capillary: 116 mg/dL — ABNORMAL HIGH (ref 70–99)
Glucose-Capillary: 170 mg/dL — ABNORMAL HIGH (ref 70–99)

## 2019-07-14 LAB — PROTIME-INR
INR: 1 (ref 0.8–1.2)
Prothrombin Time: 12.9 seconds (ref 11.4–15.2)

## 2019-07-14 LAB — HEMOGLOBIN A1C
Hgb A1c MFr Bld: 8.4 % — ABNORMAL HIGH (ref 4.8–5.6)
Mean Plasma Glucose: 194.38 mg/dL

## 2019-07-14 LAB — TSH: TSH: 1.579 u[IU]/mL (ref 0.350–4.500)

## 2019-07-14 LAB — MAGNESIUM: Magnesium: 2 mg/dL (ref 1.7–2.4)

## 2019-07-14 LAB — SARS CORONAVIRUS 2 (TAT 6-24 HRS): SARS Coronavirus 2: NEGATIVE

## 2019-07-14 LAB — HIV ANTIBODY (ROUTINE TESTING W REFLEX): HIV Screen 4th Generation wRfx: NONREACTIVE

## 2019-07-14 MED ORDER — GLIPIZIDE 10 MG PO TABS
10.0000 mg | ORAL_TABLET | Freq: Two times a day (BID) | ORAL | Status: DC
  Administered 2019-07-14 – 2019-07-19 (×11): 10 mg via ORAL
  Filled 2019-07-14 (×14): qty 1

## 2019-07-14 MED ORDER — INSULIN ASPART 100 UNIT/ML ~~LOC~~ SOLN
0.0000 [IU] | Freq: Three times a day (TID) | SUBCUTANEOUS | Status: DC
  Administered 2019-07-14 – 2019-07-15 (×2): 2 [IU] via SUBCUTANEOUS
  Administered 2019-07-15: 12:00:00 3 [IU] via SUBCUTANEOUS
  Administered 2019-07-16 – 2019-07-17 (×5): 2 [IU] via SUBCUTANEOUS
  Administered 2019-07-18: 16:00:00 3 [IU] via SUBCUTANEOUS

## 2019-07-14 MED ORDER — CARVEDILOL 6.25 MG PO TABS
6.2500 mg | ORAL_TABLET | Freq: Two times a day (BID) | ORAL | Status: DC
  Administered 2019-07-14 – 2019-07-15 (×3): 6.25 mg via ORAL
  Filled 2019-07-14: qty 1
  Filled 2019-07-14: qty 2
  Filled 2019-07-14: qty 1

## 2019-07-14 MED ORDER — APIXABAN 5 MG PO TABS
5.0000 mg | ORAL_TABLET | Freq: Two times a day (BID) | ORAL | Status: DC
  Administered 2019-07-14 – 2019-07-19 (×11): 5 mg via ORAL
  Filled 2019-07-14 (×11): qty 1

## 2019-07-14 MED ORDER — HYDROCHLOROTHIAZIDE 12.5 MG PO CAPS
12.5000 mg | ORAL_CAPSULE | Freq: Every day | ORAL | Status: DC
  Administered 2019-07-14 – 2019-07-17 (×4): 12.5 mg via ORAL
  Filled 2019-07-14 (×4): qty 1

## 2019-07-14 MED ORDER — OLMESARTAN MEDOXOMIL-HCTZ 40-12.5 MG PO TABS: 1.0000 | ORAL_TABLET | Freq: Every day | ORAL | Status: DC

## 2019-07-14 MED ORDER — DILTIAZEM HCL-DEXTROSE 125-5 MG/125ML-% IV SOLN (PREMIX)
5.0000 mg/h | INTRAVENOUS | Status: DC
  Administered 2019-07-14: 13:00:00 15 mg/h via INTRAVENOUS
  Administered 2019-07-14: 12:00:00 10 mg/h via INTRAVENOUS
  Administered 2019-07-14: 5 mg/h via INTRAVENOUS
  Administered 2019-07-14 – 2019-07-16 (×6): 15 mg/h via INTRAVENOUS
  Filled 2019-07-14 (×9): qty 125

## 2019-07-14 MED ORDER — IRBESARTAN 300 MG PO TABS
300.0000 mg | ORAL_TABLET | Freq: Every day | ORAL | Status: DC
  Administered 2019-07-14 – 2019-07-19 (×6): 300 mg via ORAL
  Filled 2019-07-14 (×6): qty 1

## 2019-07-14 MED ORDER — ACETAMINOPHEN 325 MG PO TABS: 650.0000 mg | ORAL_TABLET | ORAL | Status: DC | PRN

## 2019-07-14 MED ORDER — DILTIAZEM LOAD VIA INFUSION
10.0000 mg | Freq: Once | INTRAVENOUS | Status: AC
  Administered 2019-07-14: 10 mg via INTRAVENOUS
  Filled 2019-07-14: qty 10

## 2019-07-14 MED ORDER — METOPROLOL TARTRATE 5 MG/5ML IV SOLN
5.0000 mg | Freq: Once | INTRAVENOUS | Status: AC
  Administered 2019-07-14: 5 mg via INTRAVENOUS
  Filled 2019-07-14: qty 5

## 2019-07-14 MED ORDER — ONDANSETRON HCL 4 MG/2ML IJ SOLN: 4.0000 mg | Freq: Four times a day (QID) | INTRAMUSCULAR | Status: DC | PRN

## 2019-07-14 MED ORDER — INSULIN ASPART 100 UNIT/ML ~~LOC~~ SOLN: 0.0000 [IU] | Freq: Every day | SUBCUTANEOUS | Status: DC

## 2019-07-14 NOTE — Progress Notes (Signed)
Paged about persistent elevated rates in the 130s despite IV dilt bolus/ IV dilt at max dose and BB. Spoke to Dr. Meda Coffee who recommended waiting at this time. If rates are still elevated in the evening start IV amio.   Amrit Cress Kathlen Mody, PA-C

## 2019-07-14 NOTE — Progress Notes (Signed)
Cardiology Office Consult Note    Date:  07/14/2019   ID:  Johnny [name], DOB Sep 16, 1955, MRN [id number]  PCP:  [name], MD  Cardiologist:  Fransico Him, MD   Chief Complaint  Patient presents with  . New Patient (Initial Visit)    atrial flutter    History of Present Illness:  Johnny [name] is a 64 y.o. male who is being seen today for the evaluation of atrial flutter at the request of Shamleffer, Ibtehal Jar*.  This is a 64yo male with a hx of CKD stage 3, DM2, HTN, HLD, medical noncompliance who was recently seen by his PCP and was found to be tachycardic at 140bpm .  EKG was done showing atrial flutter with RVR and he is now referred to Cardiology.    He tells me that he is very active outside and has never had any problems until about 6 weeks ago.  He says that back in the fall he felt great but then became more sedentary in the winter.  About 6 weeks ago he started having DOE with any significant exertional activities and would have to sit down to get his breath.  This was associated with exertional fatiguue as well.  He attributed it to being deconditioned from lack of exercise during the winter.  He denied any chest pain or pressure, PND, orthopnea but did notice LE edema as well.  He has never had a workup for OSA but admits to loud snoring and feeling tired during the day.  He gets up at 2am to be at work at [employer] and works 10 hours days and then whe he gets home he goes to bed.  There is no family hx of CAD but his father had some type of arrhythmia.     Past Medical History:  Diagnosis Date  . CKD (chronic kidney disease) stage 3, GFR 30-59 ml/min 06/10/2017  . Diabetes mellitus   . Dyslipidemia 08/20/2018  . Essential hypertension 08/03/2007   Qualifier: Diagnosis of  By: Linda Hedges MD, Heinz Knuckles  Med ARB, diuretic    . Guttate psoriasis   . HTN (hypertension)   . Hyperlipidemia   . Hyperlipidemia associated with type 2 diabetes mellitus ([place]) 08/03/2007   Qualifier: Diagnosis of  By: Linda Hedges MD, Heinz Knuckles  meds - none   . 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 Type II diabetes mellitus with renal manifestations, uncontrolled ([facility]) 08/03/2007   Qualifier: Diagnosis of  By: Linda Hedges MD, Heinz Knuckles  Med metformin     Past Surgical History:  Procedure Laterality Date  . TOOTH EXTRACTION      Current Medications: Current Meds  Medication Sig  . atorvastatin (LIPITOR) 40 MG tablet Take 1 tablet (40 mg total) by mouth daily.  . carvedilol (COREG) 6.25 MG tablet Take 1 tablet (6.25 mg total) by mouth 2 (two) times daily with a meal.  . glipiZIDE (GLUCOTROL) 10 MG tablet Take 10 mg by mouth 2 (two) times daily.  . metFORMIN (GLUCOPHAGE-XR) 500 MG 24 hr tablet TAKE 1 TABLET BY MOUTH TWICE A DAY  . olmesartan-hydrochlorothiazide (BENICAR HCT) 40-12.5 MG tablet Take 1 tablet by mouth daily.    Allergies:   Patient has no known allergies.   Social History   Socioeconomic History  . Marital status: Married    Spouse name: Not on file  . Number of children: 2  . Years of education: 23  .  Highest education level: Not on file  Occupational History  . Occupation: mfg  Tobacco Use  . Smoking status: Passive Smoke Exposure - Never Smoker  . Smokeless tobacco: Never Used  Substance and Sexual Activity  . Alcohol use: No  . Drug use: No  . Sexual activity: Yes  Other Topics Concern  . Not on file  Social History Narrative   [employer]. [employer]. [employer] x 4 years. Married '94. 2 sons - '95, '96. Wife is healthy. Work - Banker.   Social Determinants of Radio broadcast assistant Strain:   . Difficulty of Paying Living Expenses:   Food Insecurity:   . Worried About Charity fundraiser in the Last Year:   . Arboriculturist in the Last Year:   Transportation Needs:   . Film/video editor (Medical):   Marland Kitchen Lack of Transportation (Non-Medical):   Physical Activity:   . Days of Exercise per Week:   .  Minutes of Exercise per Session:   Stress:   . Feeling of Stress :   Social Connections:   . Frequency of Communication with Friends and Family:   . Frequency of Social Gatherings with Friends and Family:   . Attends Religious Services:   . Active Member of Clubs or Organizations:   . Attends Archivist Meetings:   Marland Kitchen Marital Status:      Family History:  The patient's family history includes Cancer in his brother; Coronary artery disease in his father; Dementia in his mother; Diabetes in his father; HIV in his brother; Hyperlipidemia in his father; Hypertension in his father; Kidney disease in his father and mother; Memory loss in his mother; Nephrolithiasis in his father.   ROS:   Please see the history of present illness.    ROS All other systems reviewed and are negative.  No flowsheet data found.  PHYSICAL EXAM:   VS:  BP (!) 160/92   Pulse (!) 134   Ht 6\' 5"  (1.956 m)   Wt (!) 330 lb 3.2 oz (149.8 kg)   SpO2 96%   BMI 39.16 kg/m    GEN: Well nourished, well developed, in no acute distress  HEENT: normal  Neck: no JVD, carotid bruits, or masses Cardiac: tachycardic; no murmurs, rubs, or gallops,no edema.  Intact distal pulses bilaterally.  Respiratory:  clear to auscultation bilaterally, normal work of breathing GI: soft, nontender, nondistended, + BS MS: no deformity or atrophy  Skin: warm and dry, no rash Neuro:  Alert and Oriented x 3, Strength and sensation are intact Psych: euthymic mood, full affect  Wt Readings from Last 3 Encounters:  07/14/19 (!) 330 lb 3.2 oz (149.8 kg)  07/01/19 (!) 325 lb 6.4 oz (147.6 kg)  03/25/19 (!) 321 lb 6.4 oz (145.8 kg)      Studies/Labs Reviewed:   EKG:  EKG is ordered today.  The ekg ordered today demonstrates atrial flutter with RVR at 136bpm  Recent Labs: 11/16/2018: ALT 13; BUN 14; Creatinine, Ser 1.53; Hemoglobin 13.3; Platelets 253.0; Potassium 3.9; Sodium 142   Lipid Panel    Component Value Date/Time    CHOL 256 (H) 06/11/2018 0930   TRIG 123.0 06/11/2018 0930   TRIG 63 05/01/2006 1329   HDL 44.00 06/11/2018 0930   CHOLHDL 6 06/11/2018 0930   VLDL 24.6 06/11/2018 0930   LDLCALC 187 (H) 06/11/2018 0930   LDLDIRECT 170.2 11/19/2012 1105    Additional studies/ records that were reviewed today include:  EKG,  OV notes from PCP    ASSESSMENT:    1. Atrial flutter with rapid ventricular response ([facility])   2. Essential hypertension   3. DM type 2, goal HbA1c < 7% ([facility])      PLAN:  In order of problems listed above:  1.  Atrial flutter with RVR -unknown duration but likely at least 6 weeks given associated sx of SOB and LE edema -HR poorly controlled in the 130's today on low dose BB -he is very symptomatic at this point with exertional fatigue and DOE with very little activity -suspect underlying OSA as well that may be driving this -plan to admit to tele bed to get rate controlled -start IV Cardizem gtt with 10mg  bolus followed by gtt and titrate to get HR controled -start Eliquis 5mg  BID for CHADS2VASC score of 2 (HTN, DM) -Check CMET, CBC, INR, TSH -2D echo once HR is controlled -consider TEE with DCCV once he has had 3 doses of Eliquis -recommend outpt sleep study  2.  HTN -BP borderline controlled -change Carvedilol to Lopressor 25mg  BID -continue Benicar HCT 40-12.5mg  daily  3.  DM type 2 -followed by PCP -HbA1C was 8.7% in July -repeat HbA1C -continue Metformin 500mg  BID and Glipizide 10mg  BID -cover with SS Insulin with BS checks qac and qhs  4.  HLD -LDL goal < 70 with DM -check FLP in am -continue atorvastatin 40mg  daily    Medication Adjustments/Labs and Tests Ordered: Current medicines are reviewed at length with the patient today.  Concerns regarding medicines are outlined above.  Medication changes, Labs and Tests ordered today are listed in the Patient Instructions below.  There are no Patient Instructions on file for this visit.   Signed, Fransico Him, MD  07/14/2019 8:33 AM    [address], [place], [place]  [address] Phone: [phone]; Fax: [phone]

## 2019-07-14 NOTE — Telephone Encounter (Signed)
error 

## 2019-07-14 NOTE — Progress Notes (Signed)
New patient came today around 10 am from doctor's office with afib with RVR HR in 130, patient is on cardiziem drip max at 15 mg per order. Will continue to monitor patient.

## 2019-07-14 NOTE — Progress Notes (Signed)
After 5 mg metoprolol iv given HR down to 128 for few minutes and back to 130, cardiology PA notified again no new order given at this time. Will continue to monitor

## 2019-07-14 NOTE — Progress Notes (Signed)
  Echocardiogram 2D Echocardiogram has been performed.  Johnny [name] 07/14/2019, 12:08 PM

## 2019-07-14 NOTE — Progress Notes (Addendum)
   07/14/19 2300  Vitals  BP (!) 144/106  MAP (mmHg) 116  Pulse Rate (!) 131  ECG Heart Rate (!) 131  Resp (!) 23  Oxygen Therapy  SpO2 95 %  MEWS Score  MEWS Temp 0  MEWS Systolic 0  MEWS Pulse 3  MEWS RR 1  MEWS LOC 0  MEWS Score 4  MEWS Score Color Red  MEWS Assessment  Is this an acute change? No  MEWS guidelines implemented *See [place]  Provider Notification  Provider Name/Title DR Emilio Aspen  Date Provider Notified 07/14/19  Time Provider Notified 2258  Notification Type Page  Notification Reason Other (Comment) (updating about HR reamins 130's)  Note  Observations q1hr VS   Not an acute change, q1h VS monitored. however updated the cardio about HR remains steady at 130's, Cardizem drip at max dose. Pt denies complains.  01:30 Return call from Dr Emilio Aspen, to keep on Cardizem drip ,currrent rate. To monitor as pt is asymptomatic.

## 2019-07-14 NOTE — Progress Notes (Signed)
HR still sustaining in 130, Cardiology PA notified 5 mg metoprolol iv order and given. Will continue to monitor .

## 2019-07-14 NOTE — Patient Instructions (Addendum)
Medication Instructions:  Your physician recommends that you continue on your current medications as directed. Please refer to the Current Medication list given to you today.  *If you need a refill on your cardiac medications before your next appointment, please call your pharmacy*  Follow-Up: At [facility], you and your health needs are our priority.  As part of our continuing mission to provide you with exceptional heart care, we have created designated Provider Care Teams.  These Care Teams include your primary Cardiologist (physician) and Advanced Practice Providers (APPs -  Physician Assistants and Nurse Practitioners) who all work together to provide you with the care you need, when you need it.  We recommend signing up for the patient portal called "MyChart".  Sign up information is provided on this After Visit Summary.  MyChart is used to connect with patients for Virtual Visits (Telemedicine).  Patients are able to view lab/test results, encounter notes, upcoming appointments, etc.  Non-urgent messages can be sent to your provider as well.   To learn more about what you can do with MyChart, go to [email or web address].    Other Instructions: Please head over to [facility] Admissions to be directly admitted.

## 2019-07-14 NOTE — H&P (Signed)
Cardiology History and Physical   Date:  07/14/2019   ID:  Johnny [name], DOB December 26, 1955, MRN [id number]  PCP:  [name], MD  Cardiologist:  Fransico Him, MD   Chief Complaint  Patient presents with  . New Patient (Initial Visit)    atrial flutter    History of Present Illness:  Johnny [name] is a 64 y.o. male who is being seen today for the evaluation of atrial flutter at the request of Shamleffer, Ibtehal Jar*.  This is a 64yo male with a hx of CKD stage 3, DM2, HTN, HLD, medical noncompliance who was recently seen by his PCP and was found to be tachycardic at 140bpm .  EKG was done showing atrial flutter with RVR and he is now referred to Cardiology.    He tells me that he is very active outside and has never had any problems until about 6 weeks ago.  He says that back in the fall he felt great but then became more sedentary in the winter.  About 6 weeks ago he started having DOE with any significant exertional activities and would have to sit down to get his breath.  This was associated with exertional fatiguue as well.  He attributed it to being deconditioned from lack of exercise during the winter.  He denied any chest pain or pressure, PND, orthopnea but did notice LE edema as well.  He has never had a workup for OSA but admits to loud snoring and feeling tired during the day.  He gets up at 2am to be at work at [employer] and works 10 hours days and then whe he gets home he goes to bed.  There is no family hx of CAD but his father had some type of arrhythmia.     Past Medical History:  Diagnosis Date  . CKD (chronic kidney disease) stage 3, GFR 30-59 ml/min 06/10/2017  . Diabetes mellitus   . Dyslipidemia 08/20/2018  . Essential hypertension 08/03/2007   Qualifier: Diagnosis of  By: Linda Hedges MD, Heinz Knuckles  Med ARB, diuretic    . Guttate psoriasis   . HTN (hypertension)   . Hyperlipidemia   . Hyperlipidemia associated with type 2 diabetes mellitus ([place]) 08/03/2007   Qualifier: Diagnosis of  By: Linda Hedges MD, Heinz Knuckles  meds - none   . 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 Type II diabetes mellitus with renal manifestations, uncontrolled ([place]) 08/03/2007   Qualifier: Diagnosis of  By: Linda Hedges MD, Heinz Knuckles  Med metformin     Past Surgical History:  Procedure Laterality Date  . TOOTH EXTRACTION      Current Medications: Current Meds  Medication Sig  . atorvastatin (LIPITOR) 40 MG tablet Take 1 tablet (40 mg total) by mouth daily.  . carvedilol (COREG) 6.25 MG tablet Take 1 tablet (6.25 mg total) by mouth 2 (two) times daily with a meal.  . glipiZIDE (GLUCOTROL) 10 MG tablet Take 10 mg by mouth 2 (two) times daily.  . metFORMIN (GLUCOPHAGE-XR) 500 MG 24 hr tablet TAKE 1 TABLET BY MOUTH TWICE A DAY  . olmesartan-hydrochlorothiazide (BENICAR HCT) 40-12.5 MG tablet Take 1 tablet by mouth daily.    Allergies:   Patient has no known allergies.   Social History   Socioeconomic History  . Marital status: Married    Spouse name: Not on file  . Number of children: 2  . Years of education:  12  . Highest education level: Not on file  Occupational History  . Occupation: mfg  Tobacco Use  . Smoking status: Passive Smoke Exposure - Never Smoker  . Smokeless tobacco: Never Used  Substance and Sexual Activity  . Alcohol use: No  . Drug use: No  . Sexual activity: Yes  Other Topics Concern  . Not on file  Social History Narrative   [employer]. [employer]. [employer] x 4 years. Married '94. 2 sons - '95, '96. Wife is healthy. Work - Banker.   Social Determinants of Radio broadcast assistant Strain:   . Difficulty of Paying Living Expenses:   Food Insecurity:   . Worried About Charity fundraiser in the Last Year:   . Arboriculturist in the Last Year:   Transportation Needs:   . Film/video editor (Medical):   Marland Kitchen Lack of Transportation (Non-Medical):   Physical Activity:   . Days of Exercise per Week:   .  Minutes of Exercise per Session:   Stress:   . Feeling of Stress :   Social Connections:   . Frequency of Communication with Friends and Family:   . Frequency of Social Gatherings with Friends and Family:   . Attends Religious Services:   . Active Member of Clubs or Organizations:   . Attends Archivist Meetings:   Marland Kitchen Marital Status:      Family History:  The patient's family history includes Cancer in his brother; Coronary artery disease in his father; Dementia in his mother; Diabetes in his father; HIV in his brother; Hyperlipidemia in his father; Hypertension in his father; Kidney disease in his father and mother; Memory loss in his mother; Nephrolithiasis in his father.   ROS:   Please see the history of present illness.    ROS All other systems reviewed and are negative.  No flowsheet data found.  PHYSICAL EXAM:   VS:  BP (!) 160/92   Pulse (!) 134   Ht 6\' 5"  (1.956 m)   Wt (!) 330 lb 3.2 oz (149.8 kg)   SpO2 96%   BMI 39.16 kg/m    GEN: Well nourished, well developed, in no acute distress  HEENT: normal  Neck: no JVD, carotid bruits, or masses Cardiac: tachycardic; no murmurs, rubs, or gallops,no edema.  Intact distal pulses bilaterally.  Respiratory:  clear to auscultation bilaterally, normal work of breathing GI: soft, nontender, nondistended, + BS MS: no deformity or atrophy  Skin: warm and dry, no rash Neuro:  Alert and Oriented x 3, Strength and sensation are intact Psych: euthymic mood, full affect  Wt Readings from Last 3 Encounters:  07/14/19 (!) 330 lb 3.2 oz (149.8 kg)  07/01/19 (!) 325 lb 6.4 oz (147.6 kg)  03/25/19 (!) 321 lb 6.4 oz (145.8 kg)      Studies/Labs Reviewed:   EKG:  EKG is ordered today.  The ekg ordered today demonstrates atrial flutter with RVR at 136bpm  Recent Labs: 11/16/2018: ALT 13; BUN 14; Creatinine, Ser 1.53; Hemoglobin 13.3; Platelets 253.0; Potassium 3.9; Sodium 142   Lipid Panel    Component Value Date/Time    CHOL 256 (H) 06/11/2018 0930   TRIG 123.0 06/11/2018 0930   TRIG 63 05/01/2006 1329   HDL 44.00 06/11/2018 0930   CHOLHDL 6 06/11/2018 0930   VLDL 24.6 06/11/2018 0930   LDLCALC 187 (H) 06/11/2018 0930   LDLDIRECT 170.2 11/19/2012 1105    Additional studies/ records that were reviewed today  include:  EKG, OV notes from PCP    ASSESSMENT:    1. Atrial flutter with rapid ventricular response ([place])   2. Essential hypertension   3. DM type 2, goal HbA1c < 7% (H[facility])      PLAN:  In order of problems listed above:  1.  Atrial flutter with RVR -unknown duration but likely at least 6 weeks given associated sx of SOB and LE edema -HR poorly controlled in the 130's today on low dose BB -he is very symptomatic at this point with exertional fatigue and DOE with very little activity -suspect underlying OSA as well that may be driving this -plan to admit to tele bed to get rate controlled -start IV Cardizem gtt with 10mg  bolus followed by gtt and titrate to get HR controled -start Eliquis 5mg  BID for CHADS2VASC score of 2 (HTN, DM) -Check CMET, CBC, INR, TSH -2D echo once HR is controlled -consider TEE with DCCV once he has had 3 doses of Eliquis -recommend outpt sleep study  2.  HTN -BP borderline controlled -change Carvedilol to Lopressor 25mg  BID -continue Benicar HCT 40-12.5mg  daily  3.  DM type 2 -followed by PCP -HbA1C was 8.7% in July -repeat HbA1C -continue Metformin 500mg  BID and Glipizide 10mg  BID -cover with SS Insulin with BS checks qac and qhs  4.  HLD -LDL goal < 70 with DM -check FLP in am -continue atorvastatin 40mg  daily    Medication Adjustments/Labs and Tests Ordered: Current medicines are reviewed at length with the patient today.  Concerns regarding medicines are outlined above.  Medication changes, Labs and Tests ordered today are listed in the Patient Instructions below.  There are no Patient Instructions on file for this visit.   Signed, Fransico Him, MD  07/14/2019 8:33 AM    [address], [place], [place]  [address] Phone: [phone]; Fax: [phone]

## 2019-07-15 DIAGNOSIS — I081 Rheumatic disorders of both mitral and tricuspid valves: Secondary | ICD-10-CM | POA: Diagnosis present

## 2019-07-15 DIAGNOSIS — I13 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 1 through stage 4 chronic kidney disease, or unspecified chronic kidney disease: Secondary | ICD-10-CM | POA: Diagnosis present

## 2019-07-15 DIAGNOSIS — I483 Typical atrial flutter: Secondary | ICD-10-CM | POA: Diagnosis present

## 2019-07-15 DIAGNOSIS — Z809 Family history of malignant neoplasm, unspecified: Secondary | ICD-10-CM | POA: Diagnosis not present

## 2019-07-15 DIAGNOSIS — I4892 Unspecified atrial flutter: Secondary | ICD-10-CM | POA: Diagnosis present

## 2019-07-15 DIAGNOSIS — I420 Dilated cardiomyopathy: Secondary | ICD-10-CM | POA: Diagnosis not present

## 2019-07-15 DIAGNOSIS — I441 Atrioventricular block, second degree: Secondary | ICD-10-CM | POA: Diagnosis present

## 2019-07-15 DIAGNOSIS — I5043 Acute on chronic combined systolic (congestive) and diastolic (congestive) heart failure: Secondary | ICD-10-CM | POA: Diagnosis present

## 2019-07-15 DIAGNOSIS — N1831 Chronic kidney disease, stage 3a: Secondary | ICD-10-CM | POA: Diagnosis present

## 2019-07-15 DIAGNOSIS — I340 Nonrheumatic mitral (valve) insufficiency: Secondary | ICD-10-CM | POA: Diagnosis not present

## 2019-07-15 DIAGNOSIS — I442 Atrioventricular block, complete: Secondary | ICD-10-CM | POA: Diagnosis not present

## 2019-07-15 DIAGNOSIS — Z833 Family history of diabetes mellitus: Secondary | ICD-10-CM | POA: Diagnosis not present

## 2019-07-15 DIAGNOSIS — Z818 Family history of other mental and behavioral disorders: Secondary | ICD-10-CM | POA: Diagnosis not present

## 2019-07-15 DIAGNOSIS — Z9119 Patient's noncompliance with other medical treatment and regimen: Secondary | ICD-10-CM

## 2019-07-15 DIAGNOSIS — Z79899 Other long term (current) drug therapy: Secondary | ICD-10-CM | POA: Diagnosis not present

## 2019-07-15 DIAGNOSIS — I429 Cardiomyopathy, unspecified: Secondary | ICD-10-CM | POA: Diagnosis present

## 2019-07-15 DIAGNOSIS — Z8249 Family history of ischemic heart disease and other diseases of the circulatory system: Secondary | ICD-10-CM | POA: Diagnosis not present

## 2019-07-15 DIAGNOSIS — Z841 Family history of disorders of kidney and ureter: Secondary | ICD-10-CM | POA: Diagnosis not present

## 2019-07-15 DIAGNOSIS — Z20822 Contact with and (suspected) exposure to covid-19: Secondary | ICD-10-CM | POA: Diagnosis present

## 2019-07-15 DIAGNOSIS — E1169 Type 2 diabetes mellitus with other specified complication: Secondary | ICD-10-CM | POA: Diagnosis present

## 2019-07-15 DIAGNOSIS — E1122 Type 2 diabetes mellitus with diabetic chronic kidney disease: Secondary | ICD-10-CM | POA: Diagnosis present

## 2019-07-15 DIAGNOSIS — Z7722 Contact with and (suspected) exposure to environmental tobacco smoke (acute) (chronic): Secondary | ICD-10-CM | POA: Diagnosis present

## 2019-07-15 DIAGNOSIS — Z7984 Long term (current) use of oral hypoglycemic drugs: Secondary | ICD-10-CM | POA: Diagnosis not present

## 2019-07-15 DIAGNOSIS — E785 Hyperlipidemia, unspecified: Secondary | ICD-10-CM | POA: Diagnosis present

## 2019-07-15 DIAGNOSIS — Z91199 Patient's noncompliance with other medical treatment and regimen due to unspecified reason: Secondary | ICD-10-CM

## 2019-07-15 DIAGNOSIS — I959 Hypotension, unspecified: Secondary | ICD-10-CM | POA: Diagnosis not present

## 2019-07-15 DIAGNOSIS — Z6839 Body mass index (BMI) 39.0-39.9, adult: Secondary | ICD-10-CM | POA: Diagnosis not present

## 2019-07-15 LAB — BASIC METABOLIC PANEL
Anion gap: 12 (ref 5–15)
BUN: 15 mg/dL (ref 8–23)
CO2: 26 mmol/L (ref 22–32)
Calcium: 9.3 mg/dL (ref 8.9–10.3)
Chloride: 105 mmol/L (ref 98–111)
Creatinine, Ser: 1.77 mg/dL — ABNORMAL HIGH (ref 0.61–1.24)
GFR calc Af Amer: 47 mL/min — ABNORMAL LOW (ref >60)
GFR calc non Af Amer: 41 mL/min — ABNORMAL LOW (ref >60)
Glucose, Bld: 130 mg/dL — ABNORMAL HIGH (ref 70–99)
Potassium: 3.8 mmol/L (ref 3.5–5.1)
Sodium: 143 mmol/L (ref 135–145)

## 2019-07-15 LAB — LIPID PANEL
Cholesterol: 213 mg/dL — ABNORMAL HIGH (ref 0–200)
HDL: 34 mg/dL — ABNORMAL LOW (ref >40)
LDL Cholesterol: 166 mg/dL — ABNORMAL HIGH (ref 0–99)
Total CHOL/HDL Ratio: 6.3 RATIO
Triglycerides: 66 mg/dL (ref <150)
VLDL: 13 mg/dL (ref 0–40)

## 2019-07-15 LAB — GLUCOSE, CAPILLARY
Glucose-Capillary: 109 mg/dL — ABNORMAL HIGH (ref 70–99)
Glucose-Capillary: 162 mg/dL — ABNORMAL HIGH (ref 70–99)
Glucose-Capillary: 143 mg/dL — ABNORMAL HIGH (ref 70–99)
Glucose-Capillary: 199 mg/dL — ABNORMAL HIGH (ref 70–99)

## 2019-07-15 MED ORDER — AMIODARONE HCL IN DEXTROSE 360-4.14 MG/200ML-% IV SOLN
30.0000 mg/h | INTRAVENOUS | Status: DC
  Administered 2019-07-15 – 2019-07-16 (×3): 30 mg/h via INTRAVENOUS
  Filled 2019-07-15 (×2): qty 200

## 2019-07-15 MED ORDER — AMIODARONE LOAD VIA INFUSION
150.0000 mg | Freq: Once | INTRAVENOUS | Status: AC
  Administered 2019-07-15: 150 mg via INTRAVENOUS
  Filled 2019-07-15: qty 83.34

## 2019-07-15 MED ORDER — CARVEDILOL 25 MG PO TABS
25.0000 mg | ORAL_TABLET | Freq: Two times a day (BID) | ORAL | Status: DC
  Administered 2019-07-15 – 2019-07-19 (×8): 25 mg via ORAL
  Filled 2019-07-15 (×8): qty 1

## 2019-07-15 MED ORDER — ATORVASTATIN CALCIUM 80 MG PO TABS
80.0000 mg | ORAL_TABLET | Freq: Every day | ORAL | Status: DC
  Administered 2019-07-15 – 2019-07-19 (×5): 80 mg via ORAL
  Filled 2019-07-15 (×5): qty 1

## 2019-07-15 MED ORDER — AMIODARONE HCL IN DEXTROSE 360-4.14 MG/200ML-% IV SOLN
60.0000 mg/h | INTRAVENOUS | Status: AC
  Administered 2019-07-15 (×2): 60 mg/h via INTRAVENOUS
  Filled 2019-07-15: qty 200

## 2019-07-15 NOTE — Progress Notes (Addendum)
Progress Note  Patient Name: Johnny [name] Date of Encounter: 07/15/2019  Primary Cardiologist: Fransico Him, MD   Subjective   Patient remains in [place] with elevated rates in the 130s. He is asymptomatic. Breathing good. Has some mild edema on his legs.   Inpatient Medications    Scheduled Meds:  apixaban  5 mg Oral BID   carvedilol  6.25 mg Oral BID WC   glipiZIDE  10 mg Oral BID WC   irbesartan  300 mg Oral Daily   And   hydrochlorothiazide  12.5 mg Oral Daily   insulin aspart  0-15 Units Subcutaneous TID WC   insulin aspart  0-5 Units Subcutaneous QHS   Continuous Infusions:  diltiazem (CARDIZEM) infusion 15 mg/hr (07/15/19 0230)   PRN Meds: acetaminophen, ondansetron (ZOFRAN) IV   Vital Signs    Vitals:   07/15/19 0301 07/15/19 0500 07/15/19 0835 07/15/19 0838  BP: (!) 145/110 (!) 150/109 (!) 157/98   Pulse: (!) 130 (!) 131    Resp: (!) 23 (!) 24    Temp: 98.1 F (36.7 C) 98.7 F (37.1 C)  98 F (36.7 C)  TempSrc:    Oral  SpO2: 91% 93% 92%   Weight:  (!) 146.5 kg    Height:        Intake/Output Summary (Last 24 hours) at 07/15/2019 0925 Last data filed at 07/15/2019 0841 Gross per 24 hour  Intake 920.69 ml  Output 2935 ml  Net -2014.31 ml   Last 3 Weights 07/15/2019 07/14/2019 07/14/2019  Weight (lbs) 322 lb 14.4 oz 327 lb 11.2 oz 330 lb 3.2 oz  Weight (kg) 146.466 kg 148.644 kg 149.778 kg      Telemetry    Aflutter, HR 130s - Personally Reviewed  ECG    No new - Personally Reviewed  Physical Exam   GEN: No acute distress.   Neck: No JVD Cardiac: Reg Irreg, no murmurs, rubs, or gallops.  Respiratory: Clear to auscultation bilaterally. GI: Soft, nontender, non-distended  MS: 1+ edema; No deformity. Neuro:  Nonfocal  Psych: Normal affect   Labs    High Sensitivity Troponin:  No results for input(s): TROPONINIHS in the last 720 hours.    Chemistry Recent Labs  Lab 07/14/19 1016 07/15/19 0408  NA 142 143  K 4.3 3.8   CL 106 105  CO2 27 26  GLUCOSE 141* 130*  BUN 16 15  CREATININE 1.78* 1.77*  CALCIUM 9.0 9.3  PROT 6.7  --   ALBUMIN 3.6  --   AST 27  --   ALT 51*  --   ALKPHOS 60  --   BILITOT 0.9  --   GFRNONAA 40* 41*  GFRAA 47* 47*  ANIONGAP 9 12     HematologyNo results for input(s): WBC, RBC, HGB, HCT, MCV, MCH, MCHC, RDW, PLT in the last 168 hours.  BNPNo results for input(s): BNP, PROBNP in the last 168 hours.   DDimer No results for input(s): DDIMER in the last 168 hours.   Radiology    ECHOCARDIOGRAM COMPLETE  Result Date: 07/14/2019    ECHOCARDIOGRAM REPORT   Patient Name:   Johnny [name] Date of Exam: 07/14/2019 Medical Rec #:  [id number]       Height:       77.0 in Accession #:    [id number]      Weight:       327.7 lb Date of Birth:  11/29/1955      BSA:  2.759 m Patient Age:    64 years        BP:           159/115 mmHg Patient Gender: M               HR:           130 bpm. Exam Location:  Inpatient Procedure: 2D Echo, Cardiac Doppler and Color Doppler Indications:    I48.92* Unspecified atrial flutter  History:        Patient has no prior history of Echocardiogram examinations.                 CHF, Arrythmias:Atrial Fibrillation; Risk Factors:Hypertension,                 Diabetes and Dyslipidemia. CKD.  Sonographer:    Jonelle Sidle Dance Referring Phys: [id number] [place]  1. Heart rate 130 bpm throughout the exam. Suspect systolic function would be higher at slower heart rate.  2. Left ventricular ejection fraction, by estimation, is 35 to 40%. The left ventricle has moderately decreased function. The left ventricle demonstrates global hypokinesis. There is mild concentric left ventricular hypertrophy. Left ventricular diastolic parameters are indeterminate.  3. Right ventricular systolic function is normal. The right ventricular size is normal. There is mildly elevated pulmonary artery systolic pressure.  4. Left atrial size was moderately dilated.  5. The  mitral valve is normal in structure. Trivial mitral valve regurgitation. No evidence of mitral stenosis.  6. The aortic valve is tricuspid. Aortic valve regurgitation is trivial. No aortic stenosis is present.  7. The inferior vena cava is dilated in size with >50% respiratory variability, suggesting right atrial pressure of 8 mmHg. FINDINGS  Left Ventricle: Left ventricular ejection fraction, by estimation, is 35 to 40%. The left ventricle has moderately decreased function. The left ventricle demonstrates global hypokinesis. The left ventricular internal cavity size was normal in size. There is mild concentric left ventricular hypertrophy. Left ventricular diastolic parameters are indeterminate. Right Ventricle: The right ventricular size is normal. No increase in right ventricular wall thickness. Right ventricular systolic function is normal. There is mildly elevated pulmonary artery systolic pressure. The tricuspid regurgitant velocity is 2.42  m/s, and with an assumed right atrial pressure of 8 mmHg, the estimated right ventricular systolic pressure is 97.3 mmHg. Left Atrium: Left atrial size was moderately dilated. Right Atrium: Right atrial size was normal in size. Pericardium: There is no evidence of pericardial effusion. Mitral Valve: The mitral valve is normal in structure. Normal mobility of the mitral valve leaflets. Trivial mitral valve regurgitation. No evidence of mitral valve stenosis. Tricuspid Valve: The tricuspid valve is normal in structure. Tricuspid valve regurgitation is mild . No evidence of tricuspid stenosis. Aortic Valve: The aortic valve is tricuspid. Aortic valve regurgitation is trivial. No aortic stenosis is present. Pulmonic Valve: The pulmonic valve was normal in structure. Pulmonic valve regurgitation is not visualized. No evidence of pulmonic stenosis. Aorta: The aortic root is normal in size and structure. Venous: The inferior vena cava is dilated in size with greater than 50%  respiratory variability, suggesting right atrial pressure of 8 mmHg. IAS/Shunts: No atrial level shunt detected by color flow Doppler. EKG: Rhythm strip during this exam demostrated atrial flutter.  LEFT VENTRICLE PLAX 2D LVIDd:         5.20 cm  Diastology LVIDs:         4.30 cm  LV e' lateral:   12.00 cm/s LV PW:  1.20 cm  LV E/e' lateral: 6.3 LV IVS:        1.20 cm  LV e' medial:    9.90 cm/s LVOT diam:     2.40 cm  LV E/e' medial:  7.6 LV SV:         67 LV SV Index:   24 LVOT Area:     4.52 cm  RIGHT VENTRICLE            IVC RV Basal diam:  3.60 cm    IVC diam: 3.00 cm RV Mid diam:    2.20 cm RV S prime:     8.38 cm/s TAPSE (M-mode): 2.1 cm LEFT ATRIUM              Index       RIGHT ATRIUM           Index LA diam:        5.50 cm  1.99 cm/m  RA Area:     23.20 cm LA Vol (A2C):   84.1 ml  30.48 ml/m RA Volume:   70.60 ml  25.59 ml/m LA Vol (A4C):   108.0 ml 39.14 ml/m LA Biplane Vol: 110.0 ml 39.87 ml/m  AORTIC VALVE LVOT Vmax:   82.80 cm/s LVOT Vmean:  50.250 cm/s LVOT VTI:    0.148 m  AORTA Ao Root diam: 4.00 cm Ao Asc diam:  3.50 cm MITRAL VALVE               TRICUSPID VALVE MV Area (PHT): 3.66 cm    TR Peak grad:   23.4 mmHg MV Decel Time: 208 msec    TR Vmax:        242.00 cm/s MV E velocity: 75.15 cm/s                            SHUNTS                            Systemic VTI:  0.15 m                            Systemic Diam: 2.40 cm Skeet Latch MD Electronically signed by Skeet Latch MD Signature Date/Time: 07/14/2019/4:50:11 PM    Final     Cardiac Studies   Echo 07/14/19  1. Heart rate 130 bpm throughout the exam. Suspect systolic function  would be higher at slower heart rate.  2. Left ventricular ejection fraction, by estimation, is 35 to 40%. The  left ventricle has moderately decreased function. The left ventricle  demonstrates global hypokinesis. There is mild concentric left ventricular  hypertrophy. Left ventricular  diastolic parameters are indeterminate.  3.  Right ventricular systolic function is normal. The right ventricular  size is normal. There is mildly elevated pulmonary artery systolic  pressure.  4. Left atrial size was moderately dilated.  5. The mitral valve is normal in structure. Trivial mitral valve  regurgitation. No evidence of mitral stenosis.  6. The aortic valve is tricuspid. Aortic valve regurgitation is trivial.  No aortic stenosis is present.  7. The inferior vena cava is dilated in size with >50% respiratory  variability, suggesting right atrial pressure of 8 mmHg.   Patient Profile     64 y.o. male with pmh of CKD stage 3, DM2, HTN, HLD, medical noncompliance who was admitted from the office for  Aflutter RVR.   Assessment & Plan    Aflutter RVR - unknown duration, but likely 6 week given symptoms. Thought to be secondary to untreated sleep apnea - In the office HR elevated to 130s therefore admitted and started on IV dilt. Home coreg 6.25 mg continued - Eliquis 5 mg BID started for CHADSVASC of 2 (HTN, DM) - Echo showed EF 35-40%, HR elevated throughout exam, global HK, mild LVH, La moderately dilated,, dilated IVC - TSH 1.5 - Hgb 13.3 - K+ 3.8, goal > 4 - Mg 2.0 - Patient remains in rapid aflutter. Rates have been difficult to control despite IV dilt and BB. will increase coreg to 25 mg BID. He denies symptoms. - Can consider EP consult for ablation. Can also consider amiodarone for rate/rhythm control. Will discuss with MD.   HTN - Home coreg was continued - Home Benicar 40-12.5 mg daily continued  Cardiomyopathy/Systolic CHF - EF 40-97%. Likely tachy-mediated - Would need repeat echo once out of aflutter - continue BB - some lower leg edema>>in the setting of rapid aflutter  DM2 - SSI - A1C 8.7 in July  HLD - LDL 166 - On atorvastatin 40mg >>will increase  CKD stage 3 - baseline creatinine around ?1.6-1.5  - on admission creatinine 1.77  For questions or updates, please contact [place] Please consult [email or web address] for contact info under     Signed, Cadence Ninfa Meeker, PA-C  07/15/2019, 9:25 AM    The patient was seen, examined and discussed with Cadence Ninfa Meeker, PA-C   and I agree with the above.   64 y.o. male with pmh of CKD stage 3, DM2, HTN, HLD, medical noncompliance who was admitted from the office for Aflutter RV.   The patient was admitted with atrial flutter with RVR and 2-1 block, that has led to acute combined systolic diastolic CHF newly diagnosed impaired LVEF estimated at 35 to 40%, however at the time of echo acquisition the patient was in atrial flutter with RVR. He was started on IV diuretics as well as IV Cardizem drip, his heart rate remains at 130 with 2-1 block, he responded really well to IV diuretics and diuresed 2 L overnight, he feels better, his legs remain swollen but his lungs are more clear.  He remains significantly hypertensive I will increase his carvedilol to 25 mg p.o. twice daily.  For now we will continue Cardizem drip as well as amiodarone drip, will try to discontinue that over the weekend.  We will continue Eliquis. We will continue IV diuresis, his creatinine is abnormal but stable from yesterday.  His baseline is 1.5-1.8, currently 1.78.  We will plan for TEE with cardioversion on Monday. He will require outpatient sleep study.  Ena Dawley, MD 07/15/2019

## 2019-07-16 LAB — GLUCOSE, CAPILLARY
Glucose-Capillary: 137 mg/dL — ABNORMAL HIGH (ref 70–99)
Glucose-Capillary: 145 mg/dL — ABNORMAL HIGH (ref 70–99)
Glucose-Capillary: 159 mg/dL — ABNORMAL HIGH (ref 70–99)
Glucose-Capillary: 180 mg/dL — ABNORMAL HIGH (ref 70–99)

## 2019-07-16 MED ORDER — SODIUM CHLORIDE 0.9 % IV BOLUS
500.0000 mL | Freq: Once | INTRAVENOUS | Status: AC | PRN
  Administered 2019-07-16: 500 mL via INTRAVENOUS

## 2019-07-16 MED ORDER — NITROGLYCERIN IN D5W 200-5 MCG/ML-% IV SOLN: 0.0000 ug/min | INTRAVENOUS | Status: DC

## 2019-07-16 MED ORDER — SODIUM CHLORIDE 0.9 % IV SOLN: 250.0000 mL | INTRAVENOUS | Status: DC

## 2019-07-16 MED ORDER — NOREPINEPHRINE 4 MG/250ML-% IV SOLN
2.0000 ug/min | INTRAVENOUS | Status: DC
  Administered 2019-07-16: 5 ug/min via INTRAVENOUS
  Filled 2019-07-16: qty 250

## 2019-07-16 NOTE — Progress Notes (Signed)
Progress Note  Patient Name: Johnny [name] Date of Encounter: 07/16/2019  Primary Cardiologist: Fransico Him, MD   Subjective   Patient remains in [place] with elevated rates in the 120s. He is asymptomatic. Breathing good.   Inpatient Medications    Scheduled Meds: . apixaban  5 mg Oral BID  . atorvastatin  80 mg Oral Daily  . carvedilol  25 mg Oral BID WC  . glipiZIDE  10 mg Oral BID WC  . irbesartan  300 mg Oral Daily   And  . hydrochlorothiazide  12.5 mg Oral Daily  . insulin aspart  0-15 Units Subcutaneous TID WC  . insulin aspart  0-5 Units Subcutaneous QHS   Continuous Infusions: . amiodarone 30 mg/hr (07/16/19 0713)  . diltiazem (CARDIZEM) infusion 15 mg/hr (07/16/19 0448)   PRN Meds: acetaminophen, ondansetron (ZOFRAN) IV   Vital Signs    Vitals:   07/16/19 0800 07/16/19 0811 07/16/19 0900 07/16/19 1000  BP: (!) 127/102 (!) 125/101 (!) 120/96 (!) 122/93  Pulse: (!) 124 (!) 124 (!) 122 (!) 123  Resp: (!) 25 (!) 25 20 14   Temp:  98.7 F (37.1 C)    TempSrc:  Oral    SpO2: 95% 91% 90% 94%  Weight:      Height:        Intake/Output Summary (Last 24 hours) at 07/16/2019 1104 Last data filed at 07/16/2019 1057 Gross per 24 hour  Intake 760 ml  Output 2637 ml  Net -1877 ml   Last 3 Weights 07/16/2019 07/15/2019 07/14/2019  Weight (lbs) 320 lb 11.2 oz 322 lb 14.4 oz 327 lb 11.2 oz  Weight (kg) 145.469 kg 146.466 kg 148.644 kg     Telemetry    Aflutter, HR 130s - Personally Reviewed  ECG    No new - Personally Reviewed  Physical Exam   GEN: No acute distress.   Neck: No JVD Cardiac: Reg Irreg, no murmurs, rubs, or gallops.  Respiratory: Clear to auscultation bilaterally. GI: Soft, nontender, non-distended  MS: 1+ edema; No deformity. Neuro:  Nonfocal  Psych: Normal affect   Labs    High Sensitivity Troponin:  No results for input(s): TROPONINIHS in the last 720 hours.    Chemistry Recent Labs  Lab 07/14/19 1016 07/15/19 0408  NA  142 143  K 4.3 3.8  CL 106 105  CO2 27 26  GLUCOSE 141* 130*  BUN 16 15  CREATININE 1.78* 1.77*  CALCIUM 9.0 9.3  PROT 6.7  --   ALBUMIN 3.6  --   AST 27  --   ALT 51*  --   ALKPHOS 60  --   BILITOT 0.9  --   GFRNONAA 40* 41*  GFRAA 47* 47*  ANIONGAP 9 12     HematologyNo results for input(s): WBC, RBC, HGB, HCT, MCV, MCH, MCHC, RDW, PLT in the last 168 hours.  BNPNo results for input(s): BNP, PROBNP in the last 168 hours.   DDimer No results for input(s): DDIMER in the last 168 hours.   Radiology    ECHOCARDIOGRAM COMPLETE  Result Date: 07/14/2019    ECHOCARDIOGRAM REPORT   Patient Name:   MATTEW CHRISWELL Date of Exam: 07/14/2019 Medical Rec #:  [id number]       Height:       77.0 in Accession #:    [id number]      Weight:       327.7 lb Date of Birth:  12-08-1955      BSA:  2.759 m Patient Age:    64 years        BP:           159/115 mmHg Patient Gender: M               HR:           130 bpm. Exam Location:  Inpatient Procedure: 2D Echo, Cardiac Doppler and Color Doppler Indications:    I48.92* Unspecified atrial flutter  History:        Patient has no prior history of Echocardiogram examinations.                 CHF, Arrythmias:Atrial Fibrillation; Risk Factors:Hypertension,                 Diabetes and Dyslipidemia. CKD.  Sonographer:    Jonelle Sidle Dance Referring Phys: [id number] [place]  1. Heart rate 130 bpm throughout the exam. Suspect systolic function would be higher at slower heart rate.  2. Left ventricular ejection fraction, by estimation, is 35 to 40%. The left ventricle has moderately decreased function. The left ventricle demonstrates global hypokinesis. There is mild concentric left ventricular hypertrophy. Left ventricular diastolic parameters are indeterminate.  3. Right ventricular systolic function is normal. The right ventricular size is normal. There is mildly elevated pulmonary artery systolic pressure.  4. Left atrial size was moderately  dilated.  5. The mitral valve is normal in structure. Trivial mitral valve regurgitation. No evidence of mitral stenosis.  6. The aortic valve is tricuspid. Aortic valve regurgitation is trivial. No aortic stenosis is present.  7. The inferior vena cava is dilated in size with >50% respiratory variability, suggesting right atrial pressure of 8 mmHg. FINDINGS  Left Ventricle: Left ventricular ejection fraction, by estimation, is 35 to 40%. The left ventricle has moderately decreased function. The left ventricle demonstrates global hypokinesis. The left ventricular internal cavity size was normal in size. There is mild concentric left ventricular hypertrophy. Left ventricular diastolic parameters are indeterminate. Right Ventricle: The right ventricular size is normal. No increase in right ventricular wall thickness. Right ventricular systolic function is normal. There is mildly elevated pulmonary artery systolic pressure. The tricuspid regurgitant velocity is 2.42  m/s, and with an assumed right atrial pressure of 8 mmHg, the estimated right ventricular systolic pressure is 30.1 mmHg. Left Atrium: Left atrial size was moderately dilated. Right Atrium: Right atrial size was normal in size. Pericardium: There is no evidence of pericardial effusion. Mitral Valve: The mitral valve is normal in structure. Normal mobility of the mitral valve leaflets. Trivial mitral valve regurgitation. No evidence of mitral valve stenosis. Tricuspid Valve: The tricuspid valve is normal in structure. Tricuspid valve regurgitation is mild . No evidence of tricuspid stenosis. Aortic Valve: The aortic valve is tricuspid. Aortic valve regurgitation is trivial. No aortic stenosis is present. Pulmonic Valve: The pulmonic valve was normal in structure. Pulmonic valve regurgitation is not visualized. No evidence of pulmonic stenosis. Aorta: The aortic root is normal in size and structure. Venous: The inferior vena cava is dilated in size with  greater than 50% respiratory variability, suggesting right atrial pressure of 8 mmHg. IAS/Shunts: No atrial level shunt detected by color flow Doppler. EKG: Rhythm strip during this exam demostrated atrial flutter.  LEFT VENTRICLE PLAX 2D LVIDd:         5.20 cm  Diastology LVIDs:         4.30 cm  LV e' lateral:   12.00 cm/s LV PW:  1.20 cm  LV E/e' lateral: 6.3 LV IVS:        1.20 cm  LV e' medial:    9.90 cm/s LVOT diam:     2.40 cm  LV E/e' medial:  7.6 LV SV:         67 LV SV Index:   24 LVOT Area:     4.52 cm  RIGHT VENTRICLE            IVC RV Basal diam:  3.60 cm    IVC diam: 3.00 cm RV Mid diam:    2.20 cm RV S prime:     8.38 cm/s TAPSE (M-mode): 2.1 cm LEFT ATRIUM              Index       RIGHT ATRIUM           Index LA diam:        5.50 cm  1.99 cm/m  RA Area:     23.20 cm LA Vol (A2C):   84.1 ml  30.48 ml/m RA Volume:   70.60 ml  25.59 ml/m LA Vol (A4C):   108.0 ml 39.14 ml/m LA Biplane Vol: 110.0 ml 39.87 ml/m  AORTIC VALVE LVOT Vmax:   82.80 cm/s LVOT Vmean:  50.250 cm/s LVOT VTI:    0.148 m  AORTA Ao Root diam: 4.00 cm Ao Asc diam:  3.50 cm MITRAL VALVE               TRICUSPID VALVE MV Area (PHT): 3.66 cm    TR Peak grad:   23.4 mmHg MV Decel Time: 208 msec    TR Vmax:        242.00 cm/s MV E velocity: 75.15 cm/s                            SHUNTS                            Systemic VTI:  0.15 m                            Systemic Diam: 2.40 cm Skeet Latch MD Electronically signed by Skeet Latch MD Signature Date/Time: 07/14/2019/4:50:11 PM    Final     Cardiac Studies   Echo 07/14/19  1. Heart rate 130 bpm throughout the exam. Suspect systolic function  would be higher at slower heart rate.  2. Left ventricular ejection fraction, by estimation, is 35 to 40%. The  left ventricle has moderately decreased function. The left ventricle  demonstrates global hypokinesis. There is mild concentric left ventricular  hypertrophy. Left ventricular  diastolic parameters are  indeterminate.  3. Right ventricular systolic function is normal. The right ventricular  size is normal. There is mildly elevated pulmonary artery systolic  pressure.  4. Left atrial size was moderately dilated.  5. The mitral valve is normal in structure. Trivial mitral valve  regurgitation. No evidence of mitral stenosis.  6. The aortic valve is tricuspid. Aortic valve regurgitation is trivial.  No aortic stenosis is present.  7. The inferior vena cava is dilated in size with >50% respiratory  variability, suggesting right atrial pressure of 8 mmHg.   Patient Profile     64 y.o. male with pmh of CKD stage 3, DM2, HTN, HLD, medical noncompliance who was admitted from the office for  Aflutter RVR.   Assessment & Plan    Aflutter RVR HTN Cardiomyopathy/Systolic CHF DM2 HLD CKD stage 3  64 y.o. male with pmh of CKD stage 3, DM2, HTN, HLD, medical noncompliance who was admitted from the office for Aflutter RV.   The patient was admitted with atrial flutter with RVR and 2-1 block, that has led to acute combined systolic diastolic CHF newly diagnosed impaired LVEF estimated at 35 to 40%, however at the time of echo acquisition the patient was in atrial flutter with RVR. He was started on IV diuretics as well as IV Cardizem drip, his heart rate remains at 130 with 2-1 block, he responded really well to IV diuretics and diuresed 2 L overnight, he feels better, lower extremity edema has resolved, his lungs are more clear. I will increase his carvedilol to 25 mg p.o. twice daily.  For now we will continue Cardizem drip as well as amiodarone drip.  We will continue Eliquis. We will continue IV diuresis, his creatinine is abnormal but stable from yesterday.  His baseline is 1.5-1.8, currently 1.77, stable from yesterday.  We will plan for TEE with cardioversion on Monday. He will require outpatient sleep study.  Signed, Ena Dawley, MD  07/16/2019, 11:04 AM

## 2019-07-16 NOTE — Significant Event (Signed)
I was notified by the patient's nurse that the patient's HR dropped from 90s to 40-60s, still in ongoing flutter. He to had a drop in SBP from 120s to 70s. He was noted to be weak and diaphoretic.   The patient's diltiazem gtt and amiodarone gtt were were stopped. 1L NS has been given.  On my evaluation reports feeling weak. SBP remains in the 70s. Suspect symptoms to be due to drop in AV conduction from flutter with possible effect of negative inotropy in the setting of reduced LVEF. At this time, will start NE gtt and transfer the patient to ICU.

## 2019-07-16 NOTE — Significant Event (Signed)
Rapid Response Event Note  Overview: Hypotension  Initial Focused Assessment: I was notified by Cristal Deer of Mr. Choi's change in HR and BP. I instructed Ronalee Belts to notify primary svc and initiate NS bolus until I could come to the bedside. Upon my arrival, Mr. Edmonston was alert, oriented x4 and dizzy feeling weak. He denied CP and SOB. He is very diaphoretic, skin pink and warm. 12 lead EKG shows a slow atrial flutter in the 40s and 50s. Current BP 71/59 (65), RR 14 with no distress. RA sats 98%. Ronalee Belts stated he stopped the Amiodarone and Diltiazem infusions at 2040 when he saw Mr. Peron's HR change. I personally reviewed Mr. Cropper's telemetry and his HR was 123 aflutter at 1900, then aflutter in the 60s at [place] and progressed to aflutter 48-50 at 2040. Dr. Rhae Hammock came to the bedside and ordered Norepi gtt and transfer to ICU.   Interventions: -NS 500 cc bolus -Norepinephrine at 15mcg/kg/min -tx ICU  Event Summary: Called at 2057 Arrived at 2110 Call ended 2200  Madelynn Done

## 2019-07-16 NOTE — Progress Notes (Signed)
Pt was starting to have dizziness and near syncope, pt's HR dropped to low 60's, did an ECG showed still Aflutter with a normal QTC, lowered Cardizem to 10, then 5 due to pt still having symptoms, called MD ordered to stop the drip and to call back if still S/S,  then his HR dropped down to the 40's stopped Cardizem and Amiodarone.  Called RR RN to help, told to start a bolus of 500 ml, put pt on O2 at 4L. BP were in the 70's by this time, BP started to trend up to the 80's then 90's.  RR RN started a Norepinephrine drip per MD and MD ordered transfer to 2H15, so pt can continue to have Norepinephrine on a ICU floor, will continue to monitor, Thanks Arvella Nigh RN.

## 2019-07-17 DIAGNOSIS — I442 Atrioventricular block, complete: Secondary | ICD-10-CM

## 2019-07-17 LAB — CBC
HCT: 42 % (ref 39.0–52.0)
Hemoglobin: 13.3 g/dL (ref 13.0–17.0)
MCH: 28.5 pg (ref 26.0–34.0)
MCHC: 31.7 g/dL (ref 30.0–36.0)
MCV: 89.9 fL (ref 80.0–100.0)
Platelets: 273 10*3/uL (ref 150–400)
RBC: 4.67 MIL/uL (ref 4.22–5.81)
RDW: 14.3 % (ref 11.5–15.5)
WBC: 6.3 10*3/uL (ref 4.0–10.5)
nRBC: 0 % (ref 0.0–0.2)

## 2019-07-17 LAB — BASIC METABOLIC PANEL
Anion gap: 9 (ref 5–15)
BUN: 18 mg/dL (ref 8–23)
CO2: 26 mmol/L (ref 22–32)
Calcium: 9 mg/dL (ref 8.9–10.3)
Chloride: 106 mmol/L (ref 98–111)
Creatinine, Ser: 2.21 mg/dL — ABNORMAL HIGH (ref 0.61–1.24)
GFR calc Af Amer: 36 mL/min — ABNORMAL LOW (ref >60)
GFR calc non Af Amer: 31 mL/min — ABNORMAL LOW (ref >60)
Glucose, Bld: 153 mg/dL — ABNORMAL HIGH (ref 70–99)
Potassium: 4.7 mmol/L (ref 3.5–5.1)
Sodium: 141 mmol/L (ref 135–145)

## 2019-07-17 LAB — GLUCOSE, CAPILLARY
Glucose-Capillary: 122 mg/dL — ABNORMAL HIGH (ref 70–99)
Glucose-Capillary: 163 mg/dL — ABNORMAL HIGH (ref 70–99)
Glucose-Capillary: 115 mg/dL — ABNORMAL HIGH (ref 70–99)
Glucose-Capillary: 192 mg/dL — ABNORMAL HIGH (ref 70–99)

## 2019-07-17 LAB — MAGNESIUM: Magnesium: 2 mg/dL (ref 1.7–2.4)

## 2019-07-17 LAB — MRSA PCR SCREENING: MRSA by PCR: NEGATIVE

## 2019-07-17 MED ORDER — SODIUM CHLORIDE 0.9% FLUSH
3.0000 mL | Freq: Two times a day (BID) | INTRAVENOUS | Status: DC
  Administered 2019-07-17 – 2019-07-19 (×5): 3 mL via INTRAVENOUS

## 2019-07-17 MED ORDER — SODIUM CHLORIDE 0.9 % IV SOLN: 250.0000 mL | INTRAVENOUS | Status: DC | PRN

## 2019-07-17 MED ORDER — SODIUM CHLORIDE 0.9% FLUSH: 3.0000 mL | INTRAVENOUS | Status: DC | PRN

## 2019-07-17 MED ORDER — WHITE PETROLATUM EX OINT: 1.0000 "application " | TOPICAL_OINTMENT | CUTANEOUS | Status: DC | PRN

## 2019-07-17 MED ORDER — CHLORHEXIDINE GLUCONATE CLOTH 2 % EX PADS
6.0000 | MEDICATED_PAD | Freq: Every day | CUTANEOUS | Status: DC
  Administered 2019-07-17 – 2019-07-19 (×3): 6 via TOPICAL

## 2019-07-17 NOTE — Progress Notes (Signed)
Progress Note  Patient Name: Johnny [name] Date of Encounter: 07/17/2019  Primary Cardiologist: Fransico Him, MD   Subjective   Events noted overnight, including drop in HR in the 40's for which he was symptomatic. Amiodarone discontinued and cardizem stopped. Patient was bolused IV NS and sent to the ICU for monitoring. Levophed gtts was started. Telemetry reveals he has been essentially in flutter with 2:1 AV Block overnight -rates in the 130's.  He is noted to be significantly hypertensive today- BP 140-160/100-120.  Inpatient Medications    Scheduled Meds: . apixaban  5 mg Oral BID  . atorvastatin  80 mg Oral Daily  . carvedilol  25 mg Oral BID WC  . Chlorhexidine Gluconate Cloth  6 each Topical Daily  . glipiZIDE  10 mg Oral BID WC  . irbesartan  300 mg Oral Daily   And  . hydrochlorothiazide  12.5 mg Oral Daily  . insulin aspart  0-15 Units Subcutaneous TID WC  . insulin aspart  0-5 Units Subcutaneous QHS   Continuous Infusions: . sodium chloride Stopped (07/16/19 2341)  . amiodarone Stopped (07/16/19 2040)  . norepinephrine (LEVOPHED) Adult infusion Stopped (07/16/19 2232)   PRN Meds: acetaminophen, ondansetron (ZOFRAN) IV   Vital Signs    Vitals:   07/17/19 0500 07/17/19 0600 07/17/19 0700 07/17/19 0732  BP: (!) 155/116 (!) 158/118 (!) 163/123   Pulse: (!) 116 (!) 125 (!) 185   Resp: (!) 25 (!) 24 (!) 23   Temp:    99.1 F (37.3 C)  TempSrc:    Oral  SpO2: 97% 93% 93%   Weight:  (!) 145.7 kg    Height:        Intake/Output Summary (Last 24 hours) at 07/17/2019 0815 Last data filed at 07/17/2019 0600 Gross per 24 hour  Intake 1132.73 ml  Output 1525 ml  Net -392.27 ml   Last 3 Weights 07/17/2019 07/16/2019 07/15/2019  Weight (lbs) 321 lb 3.4 oz 320 lb 11.2 oz 322 lb 14.4 oz  Weight (kg) 145.7 kg 145.469 kg 146.466 kg     Telemetry    Aflutter 2:1- HR 130s - Personally Reviewed  ECG    N/A  Physical Exam   General appearance: alert, no  distress and morbidly obese Neck: no carotid bruit, no JVD and thyroid not enlarged, symmetric, no tenderness/mass/nodules Lungs: clear to auscultation bilaterally Heart: regular tachycardia Abdomen: soft, non-tender; bowel sounds normal; no masses,  no organomegaly and obese Extremities: edema trace pedal edema Pulses: 2+ and symmetric Skin: Skin color, texture, turgor normal. No rashes or lesions Neurologic: Grossly normal Psych: Pleasant  Labs    High Sensitivity Troponin:  No results for input(s): TROPONINIHS in the last 720 hours.    Chemistry Recent Labs  Lab 07/14/19 1016 07/15/19 0408 07/17/19 0546  NA 142 143 141  K 4.3 3.8 4.7  CL 106 105 106  CO2 27 26 26   GLUCOSE 141* 130* 153*  BUN 16 15 18   CREATININE 1.78* 1.77* 2.21*  CALCIUM 9.0 9.3 9.0  PROT 6.7  --   --   ALBUMIN 3.6  --   --   AST 27  --   --   ALT 51*  --   --   ALKPHOS 60  --   --   BILITOT 0.9  --   --   GFRNONAA 40* 41* 31*  GFRAA 47* 47* 36*  ANIONGAP 9 12 9      Hematology Recent Labs  Lab 07/17/19  0314  WBC 6.3  RBC 4.67  HGB 13.3  HCT 42.0  MCV 89.9  MCH 28.5  MCHC 31.7  RDW 14.3  PLT 273    BNPNo results for input(s): BNP, PROBNP in the last 168 hours.   DDimer No results for input(s): DDIMER in the last 168 hours.   Radiology    No results found.  Cardiac Studies   Echo 07/14/19  1. Heart rate 130 bpm throughout the exam. Suspect systolic function  would be higher at slower heart rate.  2. Left ventricular ejection fraction, by estimation, is 35 to 40%. The  left ventricle has moderately decreased function. The left ventricle  demonstrates global hypokinesis. There is mild concentric left ventricular  hypertrophy. Left ventricular  diastolic parameters are indeterminate.  3. Right ventricular systolic function is normal. The right ventricular  size is normal. There is mildly elevated pulmonary artery systolic  pressure.  4. Left atrial size was moderately  dilated.  5. The mitral valve is normal in structure. Trivial mitral valve  regurgitation. No evidence of mitral stenosis.  6. The aortic valve is tricuspid. Aortic valve regurgitation is trivial.  No aortic stenosis is present.  7. The inferior vena cava is dilated in size with >50% respiratory  variability, suggesting right atrial pressure of 8 mmHg.   Patient Profile     64 y.o. male with pmh of CKD stage 3, DM2, HTN, HLD, medical noncompliance who was admitted from the office for Aflutter RVR.   Assessment & Plan    Aflutter RVR HTN Cardiomyopathy/Systolic CHF DM2 HLD CKD stage 3  64 y.o. male with pmh of CKD stage 3, DM2, HTN, HLD, medical noncompliance who was admitted from the office for Aflutter RV.   Overnight had what I suspect was conduction block, ?vagal - noted slow aflutter in 40's-50;s, but marked hypotension. This actually recovered very quickly and he became hypertensive on levophed gtts. Amiodarone and dilt gtts discontinued. Plan is for TEE/DCCV tomorrow at 1:30 with Dr. Meda Coffee. I would recommend we monitor him today in the ICU and I would be cautious about additional AVN blocking medications. His coreg was increased to 25 mg BID yesterday. He is likely sufficiently loaded on amiodarone - will add on level to am labs. Hold off on restarting po until after the procedure if he is loaded to avoid bradycardia.  Pixie Casino, MD, [facility] [place] Director of the [facility] Diplomate of the American Board of Clinical Lipidology Attending Cardiologist  Direct Dial: [phone]  Fax: [phone]  Website:  [email or web address]

## 2019-07-18 ENCOUNTER — Encounter (HOSPITAL_COMMUNITY)
Admission: AD | Disposition: A | Discharge status: Home / Self Care | Payer: Self-pay | Source: Ambulatory Visit | Attending: Cardiology

## 2019-07-18 ENCOUNTER — Encounter (HOSPITAL_COMMUNITY): Payer: Self-pay | Admitting: Cardiology

## 2019-07-18 ENCOUNTER — Inpatient Hospital Stay (HOSPITAL_COMMUNITY): Payer: BC Managed Care – PPO | Admitting: Certified Registered Nurse Anesthetist

## 2019-07-18 ENCOUNTER — Inpatient Hospital Stay (HOSPITAL_COMMUNITY): Payer: BC Managed Care – PPO

## 2019-07-18 DIAGNOSIS — I420 Dilated cardiomyopathy: Secondary | ICD-10-CM

## 2019-07-18 DIAGNOSIS — I5043 Acute on chronic combined systolic (congestive) and diastolic (congestive) heart failure: Secondary | ICD-10-CM

## 2019-07-18 DIAGNOSIS — I340 Nonrheumatic mitral (valve) insufficiency: Secondary | ICD-10-CM

## 2019-07-18 DIAGNOSIS — I483 Typical atrial flutter: Secondary | ICD-10-CM

## 2019-07-18 DIAGNOSIS — Z9119 Patient's noncompliance with other medical treatment and regimen: Secondary | ICD-10-CM

## 2019-07-18 DIAGNOSIS — I13 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 1 through stage 4 chronic kidney disease, or unspecified chronic kidney disease: Secondary | ICD-10-CM

## 2019-07-18 DIAGNOSIS — I4892 Unspecified atrial flutter: Secondary | ICD-10-CM

## 2019-07-18 HISTORY — PX: TEE WITHOUT CARDIOVERSION: SHX5443

## 2019-07-18 HISTORY — PX: CARDIOVERSION: SHX1299

## 2019-07-18 LAB — CBC
HCT: 43.2 % (ref 39.0–52.0)
Hemoglobin: 13.7 g/dL (ref 13.0–17.0)
MCH: 28.7 pg (ref 26.0–34.0)
MCHC: 31.7 g/dL (ref 30.0–36.0)
MCV: 90.4 fL (ref 80.0–100.0)
Platelets: 281 10*3/uL (ref 150–400)
RBC: 4.78 MIL/uL (ref 4.22–5.81)
RDW: 14.2 % (ref 11.5–15.5)
WBC: 5.1 10*3/uL (ref 4.0–10.5)
nRBC: 0 % (ref 0.0–0.2)

## 2019-07-18 LAB — BASIC METABOLIC PANEL
Anion gap: 9 (ref 5–15)
BUN: 17 mg/dL (ref 8–23)
CO2: 29 mmol/L (ref 22–32)
Calcium: 8.9 mg/dL (ref 8.9–10.3)
Chloride: 105 mmol/L (ref 98–111)
Creatinine, Ser: 2.06 mg/dL — ABNORMAL HIGH (ref 0.61–1.24)
GFR calc Af Amer: 39 mL/min — ABNORMAL LOW (ref >60)
GFR calc non Af Amer: 34 mL/min — ABNORMAL LOW (ref >60)
Glucose, Bld: 118 mg/dL — ABNORMAL HIGH (ref 70–99)
Potassium: 4.1 mmol/L (ref 3.5–5.1)
Sodium: 143 mmol/L (ref 135–145)

## 2019-07-18 LAB — GLUCOSE, CAPILLARY
Glucose-Capillary: 100 mg/dL — ABNORMAL HIGH (ref 70–99)
Glucose-Capillary: 96 mg/dL (ref 70–99)
Glucose-Capillary: 146 mg/dL — ABNORMAL HIGH (ref 70–99)
Glucose-Capillary: 169 mg/dL — ABNORMAL HIGH (ref 70–99)

## 2019-07-18 LAB — PROTIME-INR
INR: 1.1 (ref 0.8–1.2)
Prothrombin Time: 13.8 seconds (ref 11.4–15.2)

## 2019-07-18 SURGERY — ECHOCARDIOGRAM, TRANSESOPHAGEAL
Anesthesia: General

## 2019-07-18 MED ORDER — METOPROLOL TARTRATE 5 MG/5ML IV SOLN
INTRAVENOUS | Status: AC
  Filled 2019-07-18: qty 5

## 2019-07-18 MED ORDER — PROPOFOL 500 MG/50ML IV EMUL
INTRAVENOUS | Status: DC | PRN
  Administered 2019-07-18: 100 ug/kg/min via INTRAVENOUS

## 2019-07-18 MED ORDER — METOPROLOL TARTRATE 5 MG/5ML IV SOLN
INTRAVENOUS | Status: AC
  Filled 2019-07-18: qty 10

## 2019-07-18 MED ORDER — METOPROLOL TARTRATE 5 MG/5ML IV SOLN: 10.0000 mg | Freq: Once | INTRAVENOUS | Status: AC

## 2019-07-18 MED ORDER — DILTIAZEM HCL 25 MG/5ML IV SOLN
10.0000 mg | Freq: Once | INTRAVENOUS | Status: AC
  Administered 2019-07-18: 10 mg via INTRAVENOUS
  Filled 2019-07-18: qty 5

## 2019-07-18 MED ORDER — METOPROLOL TARTRATE 5 MG/5ML IV SOLN: 20.0000 mg | Freq: Once | INTRAVENOUS | Status: DC

## 2019-07-18 MED ORDER — HYDRALAZINE HCL 10 MG PO TABS
10.0000 mg | ORAL_TABLET | Freq: Three times a day (TID) | ORAL | Status: DC
  Administered 2019-07-18 – 2019-07-19 (×3): 10 mg via ORAL
  Filled 2019-07-18 (×3): qty 1

## 2019-07-18 MED ORDER — PROPOFOL 10 MG/ML IV BOLUS
INTRAVENOUS | Status: DC | PRN
  Administered 2019-07-18: 40 mg via INTRAVENOUS

## 2019-07-18 MED ORDER — HYDRALAZINE HCL 20 MG/ML IJ SOLN
5.0000 mg | Freq: Once | INTRAMUSCULAR | Status: AC
  Administered 2019-07-18: 12:00:00 5 mg via INTRAVENOUS

## 2019-07-18 MED ORDER — FUROSEMIDE 20 MG PO TABS
20.0000 mg | ORAL_TABLET | Freq: Every day | ORAL | Status: DC
  Administered 2019-07-18 – 2019-07-19 (×2): 20 mg via ORAL
  Filled 2019-07-18 (×2): qty 1

## 2019-07-18 MED ORDER — METOPROLOL TARTRATE 5 MG/5ML IV SOLN
INTRAVENOUS | Status: AC
  Administered 2019-07-18: 10 mg via INTRAVENOUS
  Filled 2019-07-18: qty 5

## 2019-07-18 MED ORDER — HYDRALAZINE HCL 20 MG/ML IJ SOLN
INTRAMUSCULAR | Status: AC
  Filled 2019-07-18: qty 1

## 2019-07-18 NOTE — Progress Notes (Addendum)
Progress Note  Patient Name: Johnny [name] Date of Encounter: 07/18/2019  Primary Cardiologist: Fransico Him, MD   Subjective   Denies any SOB or CP.  Currently aflutter with RVR on TEle in the 130's  Inpatient Medications    Scheduled Meds: . apixaban  5 mg Oral BID  . atorvastatin  80 mg Oral Daily  . carvedilol  25 mg Oral BID WC  . Chlorhexidine Gluconate Cloth  6 each Topical Daily  . glipiZIDE  10 mg Oral BID WC  . irbesartan  300 mg Oral Daily   And  . hydrochlorothiazide  12.5 mg Oral Daily  . insulin aspart  0-15 Units Subcutaneous TID WC  . insulin aspart  0-5 Units Subcutaneous QHS  . sodium chloride flush  3 mL Intravenous Q12H   Continuous Infusions: . sodium chloride Stopped (07/16/19 2341)  . sodium chloride     PRN Meds: sodium chloride, acetaminophen, ondansetron (ZOFRAN) IV, sodium chloride flush, white petrolatum   Vital Signs    Vitals:   07/18/19 0407 07/18/19 0500 07/18/19 0600 07/18/19 0700  BP: (!) 157/117 (!) 145/115 (!) 149/109 (!) 155/112  Pulse: (!) 129 (!) 130 (!) 129 (!) 130  Resp: 17 (!) 24 (!) 27 (!) 24  Temp:    98.2 F (36.8 C)  TempSrc:    Oral  SpO2: 97% 97% 96% 99%  Weight:  (!) 143.1 kg    Height:        Intake/Output Summary (Last 24 hours) at 07/18/2019 0817 Last data filed at 07/18/2019 0436 Gross per 24 hour  Intake 483 ml  Output 2325 ml  Net -1842 ml   Last 3 Weights 07/18/2019 07/17/2019 07/16/2019  Weight (lbs) 315 lb 6.4 oz 321 lb 3.4 oz 320 lb 11.2 oz  Weight (kg) 143.065 kg 145.7 kg 145.469 kg     Telemetry    Aflutter 2:1- HR 130s - Personally Reviewed  ECG    N/A  Physical Exam  GEN: Well nourished, well developed in no acute distress HEENT: Normal NECK: No JVD; No carotid bruits LYMPHATICS: No lymphadenopathy CARDIAC:irregularly irregular and tachy, no murmurs, rubs, gallops RESPIRATORY:  Clear to auscultation without rales, wheezing or rhonchi  ABDOMEN: Soft, non-tender,  non-distended MUSCULOSKELETAL:  Trace LE edema; No deformity  SKIN: Warm and dry NEUROLOGIC:  Alert and oriented x 3 PSYCHIATRIC:  Normal affect    Labs    High Sensitivity Troponin:  No results for input(s): TROPONINIHS in the last 720 hours.    Chemistry Recent Labs  Lab 07/14/19 1016 07/14/19 1016 07/15/19 0408 07/17/19 0546 07/18/19 0504  NA 142   < > 143 141 143  K 4.3   < > 3.8 4.7 4.1  CL 106   < > 105 106 105  CO2 27   < > 26 26 29   GLUCOSE 141*   < > 130* 153* 118*  BUN 16   < > 15 18 17   CREATININE 1.78*   < > 1.77* 2.21* 2.06*  CALCIUM 9.0   < > 9.3 9.0 8.9  PROT 6.7  --   --   --   --   ALBUMIN 3.6  --   --   --   --   AST 27  --   --   --   --   ALT 51*  --   --   --   --   ALKPHOS 60  --   --   --   --  BILITOT 0.9  --   --   --   --   GFRNONAA 40*   < > 41* 31* 34*  GFRAA 47*   < > 47* 36* 39*  ANIONGAP 9   < > 12 9 9    < > = values in this interval not displayed.     Hematology Recent Labs  Lab 07/17/19 0314 07/18/19 0504  WBC 6.3 5.1  RBC 4.67 4.78  HGB 13.3 13.7  HCT 42.0 43.2  MCV 89.9 90.4  MCH 28.5 28.7  MCHC 31.7 31.7  RDW 14.3 14.2  PLT 273 281    BNPNo results for input(s): BNP, PROBNP in the last 168 hours.   DDimer No results for input(s): DDIMER in the last 168 hours.   Radiology    No results found.  Cardiac Studies   Echo 07/14/19  1. Heart rate 130 bpm throughout the exam. Suspect systolic function  would be higher at slower heart rate.  2. Left ventricular ejection fraction, by estimation, is 35 to 40%. The  left ventricle has moderately decreased function. The left ventricle  demonstrates global hypokinesis. There is mild concentric left ventricular  hypertrophy. Left ventricular  diastolic parameters are indeterminate.  3. Right ventricular systolic function is normal. The right ventricular  size is normal. There is mildly elevated pulmonary artery systolic  pressure.  4. Left atrial size was moderately  dilated.  5. The mitral valve is normal in structure. Trivial mitral valve  regurgitation. No evidence of mitral stenosis.  6. The aortic valve is tricuspid. Aortic valve regurgitation is trivial.  No aortic stenosis is present.  7. The inferior vena cava is dilated in size with >50% respiratory  variability, suggesting right atrial pressure of 8 mmHg.   Patient Profile     64 y.o. male with pmh of CKD stage 3, DM2, HTN, HLD, medical noncompliance who was admitted from the office for Aflutter RVR.   Assessment & Plan     1.  Atrial flutter with RVR -admitted from the office with aflutter with RVR of unknown duration -likely has had this for several weeks based on symptoms -has been on Amio gtt and Carvedilol -developed significant slowing of HR yesterday am with HR in the 40-50's with marked hypotension requiring cessation of IV Amio and started on levo gtt -he has had > 3 doses of Eliquis -today HR in the -plan for TEE/DCCV today -continue carvedilol 25mg  BID -keep Amio off  2.  HTN -BP poorly controlled at 155/145mmHg -continue Carvedilol 25mg  BID  3.  DCM -EF 35-40% by echo this admit -likely tachy induced -needs restoration of NSR - plan for TEE/DCCV today  4.  Acute systolic CHF -he put out 9.6Q yesterday and is net neg 5.7L -Creatinine improved this am at 2.06 (1.78>1.77>2.21>2.06). -weight down 12lbs from admit -K+ 4.1 today -continue Carvedilol 25mg  BID and Avapro 300mg  daily -add Hydralazine 10mg  TID and if tolerates change to Bidil -stop HCTZ and add Lasix 20mg  PO daily  5.  DM2 -followed by PCP -HbA1C 8.4% -needs close followup outpt  -continue Glipizide 10mg  BID and SS Insulin -continue ARB  6.  CKD stage 3a -followed by PCP -creatinine increased from admit of 1.78 -hopefully Cr will improve with diuresis and improving CO after restoring NSR   I have spent a total of 35 minutes with patient reviewing hospital notes, 2D echo , telemetry, EKGs,  labs and examining patient as well as establishing an assessment and plan that was  discussed with the patient.  > 50% of time was spent in direct patient care.    Signed:   Fransico Him, MD [place] 07/18/2019

## 2019-07-18 NOTE — CV Procedure (Signed)
     Transesophageal Echocardiogram Note  Johnny [name] [id number] Sep 13, 1955  Procedure: Transesophageal Echocardiogram Indications: atrial flutter  Procedure Details Consent: Obtained Time Out: Verified patient identification, verified procedure, site/side was marked, verified correct patient position, special equipment/implants available, Radiology Safety Procedures followed,  medications/allergies/relevent history reviewed, required imaging and test results available.  Performed  Medications: IV propofol 250 mg IV, metoprolol 10 mg iv, cardizem 10 mg iv  No intracardiac thrombus, see TEE report for full dictation.   Complications: No apparent complications Patient did tolerate procedure well.  Ena Dawley, MD, [facility] 07/18/2019, 1:22 PM     Cardioversion Note  Johnny [name] [id number] 12/12/1955  Procedure: DC Cardioversion Indications: atrial flutter  Procedure Details Consent: Obtained Time Out: Verified patient identification, verified procedure, site/side was marked, verified correct patient position, special equipment/implants available, Radiology Safety Procedures followed,  medications/allergies/relevent history reviewed, required imaging and test results available.  Performed  The patient has been on adequate anticoagulation.  The patient received IV propofol 250 mg IV, metoprolol 10 mg IV, cardizem 10 mg IV for sedation.  Synchronous cardioversion was performed at 120 joules.  The cardioversion was successful.   Complications: No apparent complications Patient did tolerate procedure well.   Ena Dawley, MD, [facility] 07/18/2019, 1:22 PM

## 2019-07-18 NOTE — Progress Notes (Signed)
Transitions of Care Pharmacist Note  Johnny [name] is a 64 y.o. male that has been diagnosed with A Fib and will be prescribed Eliquis (apixaban) at discharge.   Patient Education: I provided the following education on Apixaban 07/18/19 to the patient: How to take the medication Described what the medication is Signs of bleeding Signs/symptoms of VTE and stroke  Answered their questions  Discharge Medications Plan: The patient is not interested in filling their discharge medications with the Transitions of Care pharmacy at this time.   If the patient later decides they would like to have the Transitions of Care pharmacy fill their discharge medications, please call us at [phone]. Thank you.    Thank you,  Sherren Kerns, PharmD PGY1 Acute Care Pharmacy Resident July 18, 2019

## 2019-07-18 NOTE — H&P (View-Only) (Signed)
Progress Note  Patient Name: Johnny [name] Date of Encounter: 07/18/2019  Primary Cardiologist: Fransico Him, MD   Subjective   Denies any SOB or CP.  Currently aflutter with RVR on TEle in the 130's  Inpatient Medications    Scheduled Meds: . apixaban  5 mg Oral BID  . atorvastatin  80 mg Oral Daily  . carvedilol  25 mg Oral BID WC  . Chlorhexidine Gluconate Cloth  6 each Topical Daily  . glipiZIDE  10 mg Oral BID WC  . irbesartan  300 mg Oral Daily   And  . hydrochlorothiazide  12.5 mg Oral Daily  . insulin aspart  0-15 Units Subcutaneous TID WC  . insulin aspart  0-5 Units Subcutaneous QHS  . sodium chloride flush  3 mL Intravenous Q12H   Continuous Infusions: . sodium chloride Stopped (07/16/19 2341)  . sodium chloride     PRN Meds: sodium chloride, acetaminophen, ondansetron (ZOFRAN) IV, sodium chloride flush, white petrolatum   Vital Signs    Vitals:   07/18/19 0407 07/18/19 0500 07/18/19 0600 07/18/19 0700  BP: (!) 157/117 (!) 145/115 (!) 149/109 (!) 155/112  Pulse: (!) 129 (!) 130 (!) 129 (!) 130  Resp: 17 (!) 24 (!) 27 (!) 24  Temp:    98.2 F (36.8 C)  TempSrc:    Oral  SpO2: 97% 97% 96% 99%  Weight:  (!) 143.1 kg    Height:        Intake/Output Summary (Last 24 hours) at 07/18/2019 0817 Last data filed at 07/18/2019 0436 Gross per 24 hour  Intake 483 ml  Output 2325 ml  Net -1842 ml   Last 3 Weights 07/18/2019 07/17/2019 07/16/2019  Weight (lbs) 315 lb 6.4 oz 321 lb 3.4 oz 320 lb 11.2 oz  Weight (kg) 143.065 kg 145.7 kg 145.469 kg     Telemetry    Aflutter 2:1- HR 130s - Personally Reviewed  ECG    N/A  Physical Exam  GEN: Well nourished, well developed in no acute distress HEENT: Normal NECK: No JVD; No carotid bruits LYMPHATICS: No lymphadenopathy CARDIAC:irregularly irregular and tachy, no murmurs, rubs, gallops RESPIRATORY:  Clear to auscultation without rales, wheezing or rhonchi  ABDOMEN: Soft, non-tender,  non-distended MUSCULOSKELETAL:  Trace LE edema; No deformity  SKIN: Warm and dry NEUROLOGIC:  Alert and oriented x 3 PSYCHIATRIC:  Normal affect    Labs    High Sensitivity Troponin:  No results for input(s): TROPONINIHS in the last 720 hours.    Chemistry Recent Labs  Lab 07/14/19 1016 07/14/19 1016 07/15/19 0408 07/17/19 0546 07/18/19 0504  NA 142   < > 143 141 143  K 4.3   < > 3.8 4.7 4.1  CL 106   < > 105 106 105  CO2 27   < > 26 26 29   GLUCOSE 141*   < > 130* 153* 118*  BUN 16   < > 15 18 17   CREATININE 1.78*   < > 1.77* 2.21* 2.06*  CALCIUM 9.0   < > 9.3 9.0 8.9  PROT 6.7  --   --   --   --   ALBUMIN 3.6  --   --   --   --   AST 27  --   --   --   --   ALT 51*  --   --   --   --   ALKPHOS 60  --   --   --   --  BILITOT 0.9  --   --   --   --   GFRNONAA 40*   < > 41* 31* 34*  GFRAA 47*   < > 47* 36* 39*  ANIONGAP 9   < > 12 9 9    < > = values in this interval not displayed.     Hematology Recent Labs  Lab 07/17/19 0314 07/18/19 0504  WBC 6.3 5.1  RBC 4.67 4.78  HGB 13.3 13.7  HCT 42.0 43.2  MCV 89.9 90.4  MCH 28.5 28.7  MCHC 31.7 31.7  RDW 14.3 14.2  PLT 273 281    BNPNo results for input(s): BNP, PROBNP in the last 168 hours.   DDimer No results for input(s): DDIMER in the last 168 hours.   Radiology    No results found.  Cardiac Studies   Echo 07/14/19  1. Heart rate 130 bpm throughout the exam. Suspect systolic function  would be higher at slower heart rate.  2. Left ventricular ejection fraction, by estimation, is 35 to 40%. The  left ventricle has moderately decreased function. The left ventricle  demonstrates global hypokinesis. There is mild concentric left ventricular  hypertrophy. Left ventricular  diastolic parameters are indeterminate.  3. Right ventricular systolic function is normal. The right ventricular  size is normal. There is mildly elevated pulmonary artery systolic  pressure.  4. Left atrial size was moderately  dilated.  5. The mitral valve is normal in structure. Trivial mitral valve  regurgitation. No evidence of mitral stenosis.  6. The aortic valve is tricuspid. Aortic valve regurgitation is trivial.  No aortic stenosis is present.  7. The inferior vena cava is dilated in size with >50% respiratory  variability, suggesting right atrial pressure of 8 mmHg.   Patient Profile     64 y.o. male with pmh of CKD stage 3, DM2, HTN, HLD, medical noncompliance who was admitted from the office for Aflutter RVR.   Assessment & Plan     1.  Atrial flutter with RVR -admitted from the office with aflutter with RVR of unknown duration -likely has had this for several weeks based on symptoms -has been on Amio gtt and Carvedilol -developed significant slowing of HR yesterday am with HR in the 40-50's with marked hypotension requiring cessation of IV Amio and started on levo gtt -he has had > 3 doses of Eliquis -today HR in the -plan for TEE/DCCV today -continue carvedilol 25mg  BID -keep Amio off  2.  HTN -BP poorly controlled at 155/160mmHg -continue Carvedilol 25mg  BID  3.  DCM -EF 35-40% by echo this admit -likely tachy induced -needs restoration of NSR - plan for TEE/DCCV today  4.  Acute systolic CHF -he put out 3.6O yesterday and is net neg 5.7L -Creatinine improved this am at 2.06 (1.78>1.77>2.21>2.06). -weight down 12lbs from admit -K+ 4.1 today -continue Carvedilol 25mg  BID and Avapro 300mg  daily -add Hydralazine 10mg  TID and if tolerates change to Bidil -stop HCTZ and add Lasix 20mg  PO daily  5.  DM2 -followed by PCP -HbA1C 8.4% -needs close followup outpt  -continue Glipizide 10mg  BID and SS Insulin -continue ARB  6.  CKD stage 3a -followed by PCP -creatinine increased from admit of 1.78 -hopefully Cr will improve with diuresis and improving CO after restoring NSR   I have spent a total of 35 minutes with patient reviewing hospital notes, 2D echo , telemetry, EKGs,  labs and examining patient as well as establishing an assessment and plan that was  discussed with the patient.  > 50% of time was spent in direct patient care.    Signed:   Fransico Him, MD [place] 07/18/2019

## 2019-07-18 NOTE — Transfer of Care (Signed)
Immediate Anesthesia Transfer of Care Note  Patient: Andersen Iorio Arras  Procedure(s) Performed: TRANSESOPHAGEAL ECHOCARDIOGRAM (TEE) (N/A ) CARDIOVERSION (N/A )  Patient Location: Endoscopy Unit  Anesthesia Type:MAC  Level of Consciousness: drowsy and patient cooperative  Airway & Oxygen Therapy: Patient connected to nasal cannula oxygen  Post-op Assessment: Report given to RN, Post -op Vital signs reviewed and stable and Patient moving all extremities X 4  Post vital signs: Reviewed and stable  Last Vitals:  Vitals Value Taken Time  BP 122/78 07/18/19 1339  Temp 36.1 C 07/18/19 1329  Pulse 78 07/18/19 1341  Resp 26 07/18/19 1341  SpO2 95 % 07/18/19 1341  Vitals shown include unvalidated device data.  Last Pain:  Vitals:   07/18/19 1339  TempSrc:   PainSc: 0-No pain         Complications: No apparent anesthesia complications

## 2019-07-18 NOTE — Discharge Instructions (Signed)

## 2019-07-18 NOTE — Anesthesia Postprocedure Evaluation (Signed)
Anesthesia Post Note  Patient: Johnny [name]  Procedure(s) Performed: TRANSESOPHAGEAL ECHOCARDIOGRAM (TEE) (N/A ) CARDIOVERSION (N/A )     Patient location during evaluation: Endoscopy Anesthesia Type: General Level of consciousness: awake and alert Pain management: pain level controlled Vital Signs Assessment: post-procedure vital signs reviewed and stable Respiratory status: spontaneous breathing, nonlabored ventilation and respiratory function stable Cardiovascular status: blood pressure returned to baseline and stable Postop Assessment: no apparent nausea or vomiting Anesthetic complications: no    Last Vitals:  Vitals:   07/18/19 1329 07/18/19 1339  BP: (!) 111/54 122/78  Pulse: 70 76  Resp: 15 18  Temp: (!) 36.1 C   SpO2: 99% 95%    Last Pain:  Vitals:   07/18/19 1339  TempSrc:   PainSc: 0-No pain                 Johnny [name],W. EDMOND

## 2019-07-18 NOTE — Interval H&P Note (Signed)
History and Physical Interval Note:  07/18/2019 11:47 AM  Johnny [name]  has presented today for surgery, with the diagnosis of aflutter.  The various methods of treatment have been discussed with the patient and family. After consideration of risks, benefits and other options for treatment, the patient has consented to  Procedure(s): TRANSESOPHAGEAL ECHOCARDIOGRAM (TEE) (N/A) CARDIOVERSION (N/A) as a surgical intervention.  The patient's history has been reviewed, patient examined, no change in status, stable for surgery.  I have reviewed the patient's chart and labs.  Questions were answered to the patient's satisfaction.     Ena Dawley

## 2019-07-18 NOTE — Anesthesia Preprocedure Evaluation (Signed)
Anesthesia Evaluation  Patient identified by MRN, date of birth, ID band Patient awake    Reviewed: Allergy & Precautions, H&P , NPO status , Patient's Chart, lab work & pertinent test results, reviewed documented beta blocker date and time   Airway Mallampati: III  TM Distance: >3 FB Neck ROM: Full    Dental no notable dental hx. (+) Teeth Intact, Dental Advisory Given   Pulmonary neg pulmonary ROS,    Pulmonary exam normal breath sounds clear to auscultation       Cardiovascular hypertension, Pt. on medications and Pt. on home beta blockers +CHF  + dysrhythmias Atrial Fibrillation  Rhythm:Irregular Rate:Tachycardia     Neuro/Psych negative neurological ROS  negative psych ROS   GI/Hepatic negative GI ROS, Neg liver ROS,   Endo/Other  diabetes, Type 2, Oral Hypoglycemic AgentsMorbid obesity  Renal/GU Renal InsufficiencyRenal disease  negative genitourinary   Musculoskeletal   Abdominal   Peds  Hematology negative hematology ROS (+)   Anesthesia Other Findings   Reproductive/Obstetrics negative OB ROS                             Anesthesia Physical Anesthesia Plan  ASA: III  Anesthesia Plan: General   Post-op Pain Management:    Induction: Intravenous  PONV Risk Score and Plan: 2 and Propofol infusion and Treatment may vary due to age or medical condition  Airway Management Planned: Nasal Cannula  Additional Equipment:   Intra-op Plan:   Post-operative Plan:   Informed Consent: I have reviewed the patients History and Physical, chart, labs and discussed the procedure including the risks, benefits and alternatives for the proposed anesthesia with the patient or authorized representative who has indicated his/her understanding and acceptance.     Dental advisory given  Plan Discussed with: CRNA  Anesthesia Plan Comments:         Anesthesia Quick Evaluation

## 2019-07-18 NOTE — Progress Notes (Signed)
Echocardiogram Echocardiogram Transesophageal has been performed.  [name] 07/18/2019, 1:26 PM

## 2019-07-19 LAB — AMIODARONE LEVEL
Amiodarone Lvl: 0.8 ug/mL — ABNORMAL LOW (ref 1.0–2.5)
N-Desethyl-Amiodarone: 0.2 ug/mL — ABNORMAL LOW (ref 1.0–2.5)

## 2019-07-19 LAB — BASIC METABOLIC PANEL
Anion gap: 8 (ref 5–15)
BUN: 22 mg/dL (ref 8–23)
CO2: 28 mmol/L (ref 22–32)
Calcium: 8.9 mg/dL (ref 8.9–10.3)
Chloride: 105 mmol/L (ref 98–111)
Creatinine, Ser: 1.96 mg/dL — ABNORMAL HIGH (ref 0.61–1.24)
GFR calc Af Amer: 42 mL/min — ABNORMAL LOW (ref >60)
GFR calc non Af Amer: 36 mL/min — ABNORMAL LOW (ref >60)
Glucose, Bld: 183 mg/dL — ABNORMAL HIGH (ref 70–99)
Potassium: 4 mmol/L (ref 3.5–5.1)
Sodium: 141 mmol/L (ref 135–145)

## 2019-07-19 LAB — GLUCOSE, CAPILLARY
Glucose-Capillary: 102 mg/dL — ABNORMAL HIGH (ref 70–99)
Glucose-Capillary: 125 mg/dL — ABNORMAL HIGH (ref 70–99)

## 2019-07-19 MED ORDER — APIXABAN 5 MG PO TABS: 5.0000 mg | ORAL_TABLET | Freq: Two times a day (BID) | ORAL | 6 refills | Status: DC

## 2019-07-19 MED ORDER — FUROSEMIDE 20 MG PO TABS: 20.0000 mg | ORAL_TABLET | Freq: Every day | ORAL | 0 refills | Status: DC

## 2019-07-19 MED ORDER — IRBESARTAN 300 MG PO TABS: 300.0000 mg | ORAL_TABLET | Freq: Every day | ORAL | 3 refills | Status: DC

## 2019-07-19 MED ORDER — ISOSORB DINITRATE-HYDRALAZINE 20-37.5 MG PO TABS
1.0000 | ORAL_TABLET | Freq: Three times a day (TID) | ORAL | Status: DC
  Administered 2019-07-19: 1 via ORAL
  Filled 2019-07-19: qty 1

## 2019-07-19 MED ORDER — ATORVASTATIN CALCIUM 80 MG PO TABS: 80.0000 mg | ORAL_TABLET | Freq: Every day | ORAL | 11 refills | Status: DC

## 2019-07-19 MED ORDER — CARVEDILOL 25 MG PO TABS: 25.0000 mg | ORAL_TABLET | Freq: Two times a day (BID) | ORAL | 3 refills | Status: DC

## 2019-07-19 MED ORDER — ISOSORB DINITRATE-HYDRALAZINE 20-37.5 MG PO TABS: 1.0000 | ORAL_TABLET | Freq: Three times a day (TID) | ORAL | 3 refills | Status: DC

## 2019-07-19 MED FILL — ELIQUIS 5 MG TABLET: 5 | 30 days supply | Qty: 60 | Fill #0

## 2019-07-19 NOTE — TOC Benefit Eligibility Note (Signed)
Transition of Care [facility]) Benefit Eligibility Note    Patient Details  Name: Johnny [name] MRN: [id number] Date of Birth: [birth year]   Medication/Dose: Eliquis 39m 2x a day  Covered?: Yes  Tier: 2 Drug  Prescription Coverage Preferred Pharmacy: [facility]  Spoke with Person/Company/Phone Number:: [employer]/ [phone] Co-Pay: 120.00 for a 30 day supply  Prior Approval: No  Deductible: (No Deductible but a 1000.00 out of pocket not met yet)       IOrbie PyoPhone Number: 07/19/2019, 8:30 AM

## 2019-07-19 NOTE — Discharge Summary (Signed)
Discharge Summary    Patient ID: Johnny [name] MRN: [id number]; DOB: 12-Dec-1955  Admit date: 07/14/2019 Discharge date: 07/19/2019  Primary Care Provider: [name], MD  Primary Cardiologist: Fransico Him, MD  Primary Electrophysiologist:  None   Discharge Diagnoses    Active Problems:   Atrial flutter [facility])   Atrial flutter with rapid ventricular response ([place])   Acute on chronic combined systolic and diastolic CHF (congestive heart failure) ([place])   Medically noncompliant   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 1 through stage 4 chronic kidney disease, or chronic kidney disease ([place])    Diagnostic Studies/Procedures    DC Cardioversion 07/18/2019 Procedure Details Consent: Obtained Time Out: Verified patient identification, verified procedure, site/side was marked, verified correct patient position, special equipment/implants available, Radiology Safety Procedures followed,  medications/allergies/relevent history reviewed, required imaging and test results available.  Performed  The patient has been on adequate anticoagulation.  The patient received IV propofol 250 mg IV, metoprolol 10 mg IV, cardizem 10 mg IV for sedation.  Synchronous cardioversion was performed at 120 joules.  The cardioversion was successful.  Complications: No apparent complications Patient did tolerate procedure well. _____________  TEE 07/18/2019 IMPRESSIONS   1. Left ventricular ejection fraction, by estimation, is 25 to 30%. The  left ventricle has severely decreased function. The left ventricle has no  regional wall motion abnormalities. Left ventricular diastolic function  could not be evaluated.  2. Right ventricular systolic function is normal. The right ventricular  size is normal.  3. Left atrial size was moderately dilated. No left atrial/left atrial  appendage thrombus was detected.  4. Right atrial size was moderately dilated.   5. The mitral valve is normal in structure. Mild mitral valve  regurgitation. No evidence of mitral stenosis.  6. Tricuspid valve regurgitation is moderate.  7. The aortic valve is normal in structure. Aortic valve regurgitation is  not visualized. No aortic stenosis is present.  8. The inferior vena cava is normal in size with greater than 50%  respiratory variability, suggesting right atrial pressure of 3 mmHg.   Echocardiogram 07/14/19 IMPRESSIONS  1. Heart rate 130 bpm throughout the exam. Suspect systolic function  would be higher at slower heart rate.  2. Left ventricular ejection fraction, by estimation, is 35 to 40%. The  left ventricle has moderately decreased function. The left ventricle  demonstrates global hypokinesis. There is mild concentric left ventricular  hypertrophy. Left ventricular  diastolic parameters are indeterminate.  3. Right ventricular systolic function is normal. The right ventricular  size is normal. There is mildly elevated pulmonary artery systolic  pressure.  4. Left atrial size was moderately dilated.  5. The mitral valve is normal in structure. Trivial mitral valve  regurgitation. No evidence of mitral stenosis.  6. The aortic valve is tricuspid. Aortic valve regurgitation is trivial.  No aortic stenosis is present.  7. The inferior vena cava is dilated in size with >50% respiratory  variability, suggesting right atrial pressure of 8 mmHg.    History of Present Illness     Johnny [name] is a 64 y.o. male with a hx of CKD stage 3, DM2, HTN, HLD, medical noncompliance who was recently seen by his PCP and was found to be tachycardic at 140bpm .  EKG was done showing atrial flutter with RVR and he is now referred to Cardiology.    He was seen in consultation by Dr. Radford Pax on 07/14/2019 at which time he reported  that he is very active outside and has never had any problems until about 6 weeks prior.  He said that back in the fall he felt  great but then became more sedentary in the winter.  About 6 weeks ago he started having DOE with any significant exertional activities and would have to sit down to get his breath.  This was associated with exertional fatigue as well.  He attributed it to being deconditioned from lack of exercise during the winter.  He denied any chest pain or pressure, PND, orthopnea but did notice LE edema as well.  He has never had a workup for OSA but admits to loud snoring and feeling tired during the day.  He gets up at 2am to be at work at [employer] and works 10 hours days and then whe he gets home he goes to bed.  There is no family hx of CAD but his father had some type of arrhythmia.    The patient was admitted to the hospital for symptomatic afib with heart rates in the 130's.   Hospital Course     Consultants: none  Pt was initially started on cardizem drip and continued on home carvedilol.  He was started on Eliquis 5 mg twice daily for CHA2DS2-VASc score of 2 (HTN, DM).  Echocardiogram (done with HR of 130) showed EF 35-40% with global hypokinesis, mild LVH, moderately dilated left atrium and dilated IVC.  TSH was 1.5.  He continued in rapid atrial flutter with difficult to control rates despite IV diltiazem and Coreg increased to 25 mg twice daily.  He was started on an amiodarone drip but developed significant bradycardia with heart rates in the 40s-50s and marked hypotension requiring cessation of IV amnio.  He underwent successful TEE guided electrical cardioversion yesterday, 07/18/19, and has been maintaining normal sinus rhythm.  He will continue on carvedilol 25 mg twice daily and Eliquis 5 mg twice daily.  His blood pressure has been elevated but is now improved.  We will continue his carvedilol 25 mg twice daily, lasix 20 mg daily and Avapro 300 mg daily.  Will change hydralazine to BiDil 20-37.5 mg 3 times daily.  The patient was gently diuresed with Lasix 20 mg p.o. x 2 doses.  Net -6.6 L fluid balance  and down 8 pounds.  Serum creatinine increased to 2.21, then back to 2.06 yesterday, 1.96 today. Plan to repeat Bmet on Monday as well as blood pressure check.  Plan to repeat echocardiogram in 1 month after maintaining sinus rhythm to see if LV function has improved.  Lipids are elevated with LDL of 166. He has been started on atorvastatin 80 mg daily.   The patient's A1c is 8.4%.  He needs close follow-up as an outpatient with his PCP.  Did the patient have an acute coronary syndrome (MI, NSTEMI, STEMI, etc) this admission?:  No                               Did the patient have a percutaneous coronary intervention (stent / angioplasty)?:  No.   _____________  Discharge Vitals Blood pressure 125/82, pulse 81, temperature 98.5 F (36.9 C), temperature source Oral, resp. rate (!) 26, height 6\' 5"  (1.956 m), weight (!) 142.8 kg, SpO2 95 %.  Filed Weights   07/18/19 0500 07/18/19 1151 07/19/19 0600  Weight: (!) 143.1 kg (!) 143.1 kg (!) 142.8 kg    Labs & Radiologic Studies  CBC Recent Labs    07/17/19 0314 07/18/19 0504  WBC 6.3 5.1  HGB 13.3 13.7  HCT 42.0 43.2  MCV 89.9 90.4  PLT 273 417   Basic Metabolic Panel Recent Labs    07/17/19 0546 07/17/19 0546 07/18/19 0504 07/19/19 1025  NA 141   < > 143 141  K 4.7   < > 4.1 4.0  CL 106   < > 105 105  CO2 26   < > 29 28  GLUCOSE 153*   < > 118* 183*  BUN 18   < > 17 22  CREATININE 2.21*   < > 2.06* 1.96*  CALCIUM 9.0   < > 8.9 8.9  MG 2.0  --   --   --    < > = values in this interval not displayed.   Liver Function Tests No results for input(s): AST, ALT, ALKPHOS, BILITOT, PROT, ALBUMIN in the last 72 hours. No results for input(s): LIPASE, AMYLASE in the last 72 hours. High Sensitivity Troponin:   No results for input(s): TROPONINIHS in the last 720 hours.  BNP Invalid input(s): POCBNP D-Dimer No results for input(s): DDIMER in the last 72 hours. Hemoglobin A1C No results for input(s): HGBA1C in the last  72 hours. Fasting Lipid Panel No results for input(s): CHOL, HDL, LDLCALC, TRIG, CHOLHDL, LDLDIRECT in the last 72 hours. Thyroid Function Tests No results for input(s): TSH, T4TOTAL, T3FREE, THYROIDAB in the last 72 hours.  Invalid input(s): FREET3 _____________  ECHOCARDIOGRAM COMPLETE  Result Date: 07/14/2019    ECHOCARDIOGRAM REPORT   Patient Name:   JURON VORHEES Date of Exam: 07/14/2019 Medical Rec #:  [id number]       Height:       77.0 in Accession #:    [id number]      Weight:       327.7 lb Date of Birth:  [birth year]      BSA:          2.759 m Patient Age:    73 years        BP:           159/115 mmHg Patient Gender: M               HR:           130 bpm. Exam Location:  Inpatient Procedure: 2D Echo, Cardiac Doppler and Color Doppler Indications:    I48.92* Unspecified atrial flutter  History:        Patient has no prior history of Echocardiogram examinations.                 CHF, Arrythmias:Atrial Fibrillation; Risk Factors:Hypertension,                 Diabetes and Dyslipidemia. CKD.  Sonographer:    Jonelle Sidle Dance Referring Phys: [id number] [place]  1. Heart rate 130 bpm throughout the exam. Suspect systolic function would be higher at slower heart rate.  2. Left ventricular ejection fraction, by estimation, is 35 to 40%. The left ventricle has moderately decreased function. The left ventricle demonstrates global hypokinesis. There is mild concentric left ventricular hypertrophy. Left ventricular diastolic parameters are indeterminate.  3. Right ventricular systolic function is normal. The right ventricular size is normal. There is mildly elevated pulmonary artery systolic pressure.  4. Left atrial size was moderately dilated.  5. The mitral valve is normal in structure. Trivial mitral valve regurgitation. No evidence of mitral stenosis.  6.  The aortic valve is tricuspid. Aortic valve regurgitation is trivial. No aortic stenosis is present.  7. The inferior vena cava is  dilated in size with >50% respiratory variability, suggesting right atrial pressure of 8 mmHg. FINDINGS  Left Ventricle: Left ventricular ejection fraction, by estimation, is 35 to 40%. The left ventricle has moderately decreased function. The left ventricle demonstrates global hypokinesis. The left ventricular internal cavity size was normal in size. There is mild concentric left ventricular hypertrophy. Left ventricular diastolic parameters are indeterminate. Right Ventricle: The right ventricular size is normal. No increase in right ventricular wall thickness. Right ventricular systolic function is normal. There is mildly elevated pulmonary artery systolic pressure. The tricuspid regurgitant velocity is 2.42  m/s, and with an assumed right atrial pressure of 8 mmHg, the estimated right ventricular systolic pressure is 42.7 mmHg. Left Atrium: Left atrial size was moderately dilated. Right Atrium: Right atrial size was normal in size. Pericardium: There is no evidence of pericardial effusion. Mitral Valve: The mitral valve is normal in structure. Normal mobility of the mitral valve leaflets. Trivial mitral valve regurgitation. No evidence of mitral valve stenosis. Tricuspid Valve: The tricuspid valve is normal in structure. Tricuspid valve regurgitation is mild . No evidence of tricuspid stenosis. Aortic Valve: The aortic valve is tricuspid. Aortic valve regurgitation is trivial. No aortic stenosis is present. Pulmonic Valve: The pulmonic valve was normal in structure. Pulmonic valve regurgitation is not visualized. No evidence of pulmonic stenosis. Aorta: The aortic root is normal in size and structure. Venous: The inferior vena cava is dilated in size with greater than 50% respiratory variability, suggesting right atrial pressure of 8 mmHg. IAS/Shunts: No atrial level shunt detected by color flow Doppler. EKG: Rhythm strip during this exam demostrated atrial flutter.  LEFT VENTRICLE PLAX 2D LVIDd:         5.20 cm   Diastology LVIDs:         4.30 cm  LV e' lateral:   12.00 cm/s LV PW:         1.20 cm  LV E/e' lateral: 6.3 LV IVS:        1.20 cm  LV e' medial:    9.90 cm/s LVOT diam:     2.40 cm  LV E/e' medial:  7.6 LV SV:         67 LV SV Index:   24 LVOT Area:     4.52 cm  RIGHT VENTRICLE            IVC RV Basal diam:  3.60 cm    IVC diam: 3.00 cm RV Mid diam:    2.20 cm RV S prime:     8.38 cm/s TAPSE (M-mode): 2.1 cm LEFT ATRIUM              Index       RIGHT ATRIUM           Index LA diam:        5.50 cm  1.99 cm/m  RA Area:     23.20 cm LA Vol (A2C):   84.1 ml  30.48 ml/m RA Volume:   70.60 ml  25.59 ml/m LA Vol (A4C):   108.0 ml 39.14 ml/m LA Biplane Vol: 110.0 ml 39.87 ml/m  AORTIC VALVE LVOT Vmax:   82.80 cm/s LVOT Vmean:  50.250 cm/s LVOT VTI:    0.148 m  AORTA Ao Root diam: 4.00 cm Ao Asc diam:  3.50 cm MITRAL VALVE  TRICUSPID VALVE MV Area (PHT): 3.66 cm    TR Peak grad:   23.4 mmHg MV Decel Time: 208 msec    TR Vmax:        242.00 cm/s MV E velocity: 75.15 cm/s                            SHUNTS                            Systemic VTI:  0.15 m                            Systemic Diam: 2.40 cm Skeet Latch MD Electronically signed by Skeet Latch MD Signature Date/Time: 07/14/2019/4:50:11 PM    Final    ECHO TEE  Result Date: 07/18/2019    TRANSESOPHOGEAL ECHO REPORT   Patient Name:   EPHREM CARRICK Date of Exam: 07/18/2019 Medical Rec #:  [id number]       Height:       77.0 in Accession #:    [id number]      Weight:       315.4 lb Date of Birth:  [birth year]      BSA:          2.715 m Patient Age:    56 years        BP:           165/125 mmHg Patient Gender: M               HR:           126 bpm. Exam Location:  Inpatient Procedure: Transesophageal Echo, Color Doppler and Cardiac Doppler Indications:     I48.92* Unspecified atrial flutter  History:         Patient has prior history of Echocardiogram examinations, most                  recent 07/14/2019. Arrythmias:Atrial Flutter and  Atrial                  Fibrillation; Risk Factors:Hypertension, Diabetes and                  Dyslipidemia.  Sonographer:     Raquel Sarna Senior RDCS Referring Phys:  [id number] Nadean Corwin HILTY Diagnosing Phys: Ena Dawley MD PROCEDURE: After discussion of the risks and benefits of a TEE, an informed consent was obtained from the patient. The transesophogeal probe was passed without difficulty through the esophogus of the patient. Sedation performed by different physician. The patient was monitored while under deep sedation. Anesthestetic sedation was provided intravenously by Anesthesiology: 261mg  of Propofol. The patient's vital signs; including heart rate, blood pressure, and oxygen saturation; remained stable throughout the procedure. The patient developed no complications during the procedure. A successful direct current cardioversion was performed at 120 joules with 1 attempt. IMPRESSIONS  1. Left ventricular ejection fraction, by estimation, is 25 to 30%. The left ventricle has severely decreased function. The left ventricle has no regional wall motion abnormalities. Left ventricular diastolic function could not be evaluated.  2. Right ventricular systolic function is normal. The right ventricular size is normal.  3. Left atrial size was moderately dilated. No left atrial/left atrial appendage thrombus was detected.  4. Right atrial size was moderately dilated.  5. The mitral valve is normal in structure. Mild mitral valve  regurgitation. No evidence of mitral stenosis.  6. Tricuspid valve regurgitation is moderate.  7. The aortic valve is normal in structure. Aortic valve regurgitation is not visualized. No aortic stenosis is present.  8. The inferior vena cava is normal in size with greater than 50% respiratory variability, suggesting right atrial pressure of 3 mmHg. Conclusion(s)/Recommendation(s): No LA/LAA thrombus identified. Successful cardioversion performed with restoration of normal sinus rhythm. FINDINGS   Left Ventricle: Left ventricular ejection fraction, by estimation, is 25 to 30%. The left ventricle has severely decreased function. The left ventricle has no regional wall motion abnormalities. The left ventricular internal cavity size was normal in size. There is no left ventricular hypertrophy. Left ventricular diastolic function could not be evaluated. Right Ventricle: The right ventricular size is normal. No increase in right ventricular wall thickness. Right ventricular systolic function is normal. Left Atrium: Left atrial size was moderately dilated. No left atrial/left atrial appendage thrombus was detected. Right Atrium: Right atrial size was moderately dilated. Pericardium: There is no evidence of pericardial effusion. Mitral Valve: The mitral valve is normal in structure. Normal mobility of the mitral valve leaflets. Mild mitral valve regurgitation. No evidence of mitral valve stenosis. Tricuspid Valve: The tricuspid valve is normal in structure. Tricuspid valve regurgitation is moderate . No evidence of tricuspid stenosis. Aortic Valve: The aortic valve is normal in structure. Aortic valve regurgitation is not visualized. No aortic stenosis is present. Pulmonic Valve: The pulmonic valve was normal in structure. Pulmonic valve regurgitation is not visualized. No evidence of pulmonic stenosis. Aorta: The aortic root is normal in size and structure. Venous: The inferior vena cava is normal in size with greater than 50% respiratory variability, suggesting right atrial pressure of 3 mmHg. IAS/Shunts: No atrial level shunt detected by color flow Doppler. Additional Comments: LVEF was severely depressed today, however most probably underestimated by tachycardia 130 BPM during acquisition. Repeat TTE in 3-4 weeks is recommended. Ena Dawley MD Electronically signed by Ena Dawley MD Signature Date/Time: 07/18/2019/1:56:40 PM    Final    Disposition   Pt is being discharged home today in good  condition.  Follow-up Plans & Appointments    Follow-up Information    Daune Perch, NP Follow up.   Specialty: Cardiology Why: Cardiology hospital follow up on 07/25/19 at 2:45. Please arrive 15 minutes early for check in.  Contact information: [address] [place] [address] [phone]        [name], MD Follow up.   Specialty: Internal Medicine Why: Follow up with PCP for diabetes management.  Contact information: [place] [address] [phone]        Sueanne Margarita, MD .   Specialty: Cardiology Contact information: [phone] [address] [place] [address] [phone]          Discharge Instructions    (HEART FAILURE PATIENTS) Call MD:  Anytime you have any of the following symptoms: 1) 3 pound weight gain in 24 hours or 5 pounds in 1 week 2) shortness of breath, with or without a dry hacking cough 3) swelling in the hands, feet or stomach 4) if you have to sleep on extra pillows at night in order to breathe.   Complete by: As directed    Diet - low sodium heart healthy   Complete by: As directed    Increase activity slowly   Complete by: As directed       Discharge Medications   Allergies  as of 07/19/2019   No Known Allergies     Medication List    STOP taking these medications   olmesartan-hydrochlorothiazide 40-12.5 MG tablet Commonly known as: Benicar HCT     TAKE these medications   apixaban 5 MG Tabs tablet Commonly known as: ELIQUIS Take 1 tablet (5 mg total) by mouth 2 (two) times daily.   atorvastatin 80 MG tablet Commonly known as: LIPITOR Take 1 tablet (80 mg total) by mouth daily. Start taking on: July 20, 2019   carvedilol 25 MG tablet Commonly known as: COREG Take 1 tablet (25 mg total) by mouth 2 (two) times daily with a meal. What changed:   medication strength  how much to take   furosemide 20 MG tablet Commonly known as: LASIX Take 1 tablet (20 mg  total) by mouth daily. Start taking on: July 20, 2019   glipiZIDE 10 MG tablet Commonly known as: GLUCOTROL Take 10 mg by mouth 2 (two) times daily.   irbesartan 300 MG tablet Commonly known as: AVAPRO Take 1 tablet (300 mg total) by mouth daily. Start taking on: July 20, 2019   isosorbide-hydrALAZINE 20-37.5 MG tablet Commonly known as: BIDIL Take 1 tablet by mouth 3 (three) times daily.   metFORMIN 500 MG 24 hr tablet Commonly known as: GLUCOPHAGE-XR TAKE 1 TABLET BY MOUTH TWICE A DAY What changed: when to take this          Outstanding Labs/Studies   BMet on Friday. Lipids and LFT in 8 weeks.   Duration of Discharge Encounter   Greater than 30 minutes including physician time.  Signed, Daune Perch, NP 07/19/2019, 11:26 AM

## 2019-07-19 NOTE — TOC Transition Note (Signed)
Transition of Care [facility]) - CM/SW Discharge Note   Patient Details  Name: AYYUB KRALL MRN: [id number] Date of Birth: 02/15/1956  [facility] CM/SW Contact:  Sharin Mons, RN Phone Number: [phone] 07/19/2019, 11:12 AM   Clinical Narrative:    Admitted with A.fib, s/p TEE/DCCV  3/15. Pt will transition to home today. NCM shared benefits check for Eliquis with pt and wife. Pt provided Eliquis 30 day free and $ 10.00 copay cards by NCM.  Wife to provide transportation to home. Contrell Ballentine (Spouse)     [phone]       PCP: Pricilla Holm  Final next level of care: Home/Self Care Barriers to Discharge: No Barriers Identified   Patient Goals and CMS Choice     Discharge Placement     Discharge Plan and Services   Social Determinants of Health (SDOH) Interventions     Readmission Risk Interventions No flowsheet data found.

## 2019-07-19 NOTE — Care Management (Signed)
Per Cierra H. W/[facility]:  Co-pay amount for Eliquis 5 mg. twice a day  for 30 day supply $120.95. Co-pay for a 90 day supply mail order Eliquis 5 mg twice a day $173.68.  No PA required No Deductible  [facility]. Mail order pharmacy: [facility].

## 2019-07-19 NOTE — Progress Notes (Signed)
Progress Note  Patient Name: Johnny [name] Date of Encounter: 07/19/2019  Primary Cardiologist: Fransico Him, MD   Subjective   Maintaining NSR s/p TEE/DCCV yesterday.  Feels much better.  Bp improved but remains elevated.  Inpatient Medications    Scheduled Meds: . apixaban  5 mg Oral BID  . atorvastatin  80 mg Oral Daily  . carvedilol  25 mg Oral BID WC  . Chlorhexidine Gluconate Cloth  6 each Topical Daily  . furosemide  20 mg Oral Daily  . glipiZIDE  10 mg Oral BID WC  . hydrALAZINE  10 mg Oral Q8H  . insulin aspart  0-15 Units Subcutaneous TID WC  . insulin aspart  0-5 Units Subcutaneous QHS  . irbesartan  300 mg Oral Daily  . sodium chloride flush  3 mL Intravenous Q12H   Continuous Infusions: . sodium chloride Stopped (07/18/19 1340)   PRN Meds: acetaminophen, ondansetron (ZOFRAN) IV, white petrolatum   Vital Signs    Vitals:   07/19/19 0500 07/19/19 0600 07/19/19 0615 07/19/19 0737  BP:   (!) 147/102   Pulse: 81     Resp: (!) 25     Temp:    98.5 F (36.9 C)  TempSrc:    Oral  SpO2: 98%     Weight:  (!) 142.8 kg    Height:        Intake/Output Summary (Last 24 hours) at 07/19/2019 4403 Last data filed at 07/19/2019 0100 Gross per 24 hour  Intake -  Output 550 ml  Net -550 ml   Last 3 Weights 07/19/2019 07/18/2019 07/18/2019  Weight (lbs) 314 lb 13.1 oz 315 lb 6.4 oz 315 lb 6.4 oz  Weight (kg) 142.8 kg 143.065 kg 143.065 kg     Telemetry    NSR - Personally Reviewed  ECG    No new EKG to review  Physical Exam  GEN: Well nourished, well developed in no acute distress HEENT: Normal NECK: No JVD; No carotid bruits LYMPHATICS: No lymphadenopathy CARDIAC:RRR, no murmurs, rubs, gallops RESPIRATORY:  Clear to auscultation without rales, wheezing or rhonchi  ABDOMEN: Soft, non-tender, non-distended MUSCULOSKELETAL:  No edema; No deformity  SKIN: Warm and dry NEUROLOGIC:  Alert and oriented x 3 PSYCHIATRIC:  Normal affect    Labs    High  Sensitivity Troponin:  No results for input(s): TROPONINIHS in the last 720 hours.    Chemistry Recent Labs  Lab 07/14/19 1016 07/14/19 1016 07/15/19 0408 07/17/19 0546 07/18/19 0504  NA 142   < > 143 141 143  K 4.3   < > 3.8 4.7 4.1  CL 106   < > 105 106 105  CO2 27   < > 26 26 29   GLUCOSE 141*   < > 130* 153* 118*  BUN 16   < > 15 18 17   CREATININE 1.78*   < > 1.77* 2.21* 2.06*  CALCIUM 9.0   < > 9.3 9.0 8.9  PROT 6.7  --   --   --   --   ALBUMIN 3.6  --   --   --   --   AST 27  --   --   --   --   ALT 51*  --   --   --   --   ALKPHOS 60  --   --   --   --   BILITOT 0.9  --   --   --   --   [facility]  40*   < > 41* 31* 34*  GFRAA 47*   < > 47* 36* 39*  ANIONGAP 9   < > 12 9 9    < > = values in this interval not displayed.     Hematology Recent Labs  Lab 07/17/19 0314 07/18/19 0504  WBC 6.3 5.1  RBC 4.67 4.78  HGB 13.3 13.7  HCT 42.0 43.2  MCV 89.9 90.4  MCH 28.5 28.7  MCHC 31.7 31.7  RDW 14.3 14.2  PLT 273 281    BNPNo results for input(s): BNP, PROBNP in the last 168 hours.   DDimer No results for input(s): DDIMER in the last 168 hours.   Radiology    ECHO TEE  Result Date: 07/18/2019    TRANSESOPHOGEAL ECHO REPORT   Patient Name:   Johnny [name] Date of Exam: 07/18/2019 Medical Rec #:  [id number]       Height:       77.0 in Accession #:    [id number]      Weight:       315.4 lb Date of Birth:  1955-11-08      BSA:          2.715 m Patient Age:    53 years        BP:           165/125 mmHg Patient Gender: M               HR:           126 bpm. Exam Location:  Inpatient Procedure: Transesophageal Echo, Color Doppler and Cardiac Doppler Indications:     I48.92* Unspecified atrial flutter  History:         Patient has prior history of Echocardiogram examinations, most                  recent 07/14/2019. Arrythmias:Atrial Flutter and Atrial                  Fibrillation; Risk Factors:Hypertension, Diabetes and                  Dyslipidemia.  Sonographer:     Raquel Sarna  Senior RDCS Referring Phys:  [id number] Nadean Corwin HILTY Diagnosing Phys: Ena Dawley MD PROCEDURE: After discussion of the risks and benefits of a TEE, an informed consent was obtained from the patient. The transesophogeal probe was passed without difficulty through the esophogus of the patient. Sedation performed by different physician. The patient was monitored while under deep sedation. Anesthestetic sedation was provided intravenously by Anesthesiology: 261mg  of Propofol. The patient's vital signs; including heart rate, blood pressure, and oxygen saturation; remained stable throughout the procedure. The patient developed no complications during the procedure. A successful direct current cardioversion was performed at 120 joules with 1 attempt. IMPRESSIONS  1. Left ventricular ejection fraction, by estimation, is 25 to 30%. The left ventricle has severely decreased function. The left ventricle has no regional wall motion abnormalities. Left ventricular diastolic function could not be evaluated.  2. Right ventricular systolic function is normal. The right ventricular size is normal.  3. Left atrial size was moderately dilated. No left atrial/left atrial appendage thrombus was detected.  4. Right atrial size was moderately dilated.  5. The mitral valve is normal in structure. Mild mitral valve regurgitation. No evidence of mitral stenosis.  6. Tricuspid valve regurgitation is moderate.  7. The aortic valve is normal in structure. Aortic valve regurgitation is not visualized. No aortic stenosis  is present.  8. The inferior vena cava is normal in size with greater than 50% respiratory variability, suggesting right atrial pressure of 3 mmHg. Conclusion(s)/Recommendation(s): No LA/LAA thrombus identified. Successful cardioversion performed with restoration of normal sinus rhythm. FINDINGS  Left Ventricle: Left ventricular ejection fraction, by estimation, is 25 to 30%. The left ventricle has severely decreased function.  The left ventricle has no regional wall motion abnormalities. The left ventricular internal cavity size was normal in size. There is no left ventricular hypertrophy. Left ventricular diastolic function could not be evaluated. Right Ventricle: The right ventricular size is normal. No increase in right ventricular wall thickness. Right ventricular systolic function is normal. Left Atrium: Left atrial size was moderately dilated. No left atrial/left atrial appendage thrombus was detected. Right Atrium: Right atrial size was moderately dilated. Pericardium: There is no evidence of pericardial effusion. Mitral Valve: The mitral valve is normal in structure. Normal mobility of the mitral valve leaflets. Mild mitral valve regurgitation. No evidence of mitral valve stenosis. Tricuspid Valve: The tricuspid valve is normal in structure. Tricuspid valve regurgitation is moderate . No evidence of tricuspid stenosis. Aortic Valve: The aortic valve is normal in structure. Aortic valve regurgitation is not visualized. No aortic stenosis is present. Pulmonic Valve: The pulmonic valve was normal in structure. Pulmonic valve regurgitation is not visualized. No evidence of pulmonic stenosis. Aorta: The aortic root is normal in size and structure. Venous: The inferior vena cava is normal in size with greater than 50% respiratory variability, suggesting right atrial pressure of 3 mmHg. IAS/Shunts: No atrial level shunt detected by color flow Doppler. Additional Comments: LVEF was severely depressed today, however most probably underestimated by tachycardia 130 BPM during acquisition. Repeat TTE in 3-4 weeks is recommended. Ena Dawley MD Electronically signed by Ena Dawley MD Signature Date/Time: 07/18/2019/1:56:40 PM    Final     Cardiac Studies   Echo 07/14/19  1. Heart rate 130 bpm throughout the exam. Suspect systolic function  would be higher at slower heart rate.  2. Left ventricular ejection fraction, by  estimation, is 35 to 40%. The  left ventricle has moderately decreased function. The left ventricle  demonstrates global hypokinesis. There is mild concentric left ventricular  hypertrophy. Left ventricular  diastolic parameters are indeterminate.  3. Right ventricular systolic function is normal. The right ventricular  size is normal. There is mildly elevated pulmonary artery systolic  pressure.  4. Left atrial size was moderately dilated.  5. The mitral valve is normal in structure. Trivial mitral valve  regurgitation. No evidence of mitral stenosis.  6. The aortic valve is tricuspid. Aortic valve regurgitation is trivial.  No aortic stenosis is present.  7. The inferior vena cava is dilated in size with >50% respiratory  variability, suggesting right atrial pressure of 8 mmHg.   TEE 07/18/2019 IMPRESSIONS   1. Left ventricular ejection fraction, by estimation, is 25 to 30%. The  left ventricle has severely decreased function. The left ventricle has no  regional wall motion abnormalities. Left ventricular diastolic function  could not be evaluated.  2. Right ventricular systolic function is normal. The right ventricular  size is normal.  3. Left atrial size was moderately dilated. No left atrial/left atrial  appendage thrombus was detected.  4. Right atrial size was moderately dilated.  5. The mitral valve is normal in structure. Mild mitral valve  regurgitation. No evidence of mitral stenosis.  6. Tricuspid valve regurgitation is moderate.  7. The aortic valve is normal in structure.  Aortic valve regurgitation is  not visualized. No aortic stenosis is present.  8. The inferior vena cava is normal in size with greater than 50%  respiratory variability, suggesting right atrial pressure of 3 mmHg.    Patient Profile     64 y.o. male with pmh of CKD stage 3, DM2, HTN, HLD, medical noncompliance who was admitted from the office for Aflutter RVR.   Assessment & Plan      1.  Atrial flutter with RVR -admitted from the office with aflutter with RVR of unknown duration -likely has had this for several weeks based on symptoms -has been on Amio gtt and Carvedilol -developed significant slowing of HR with HR in the 40-50's with marked hypotension requiring cessation of IV Amio  -s/p TEE/DCCV yesterday and maintaining NSR -continue carvedilol 25mg  BID -continue Eliquis 5mg  BID  2.  HTN -BP remains elevated but improved -continue Carvedilol 25mg  BID and Avapro 300mg  daily -change Hydralazine to Bidil 20-37.5mg  TID  3.  DCM -EF 35-40% by echo this admit -likely tachy induced -needs restoration of NSR - plan for TEE/DCCV today  4.  Acute systolic CHF -2D echo with EF 35-40% and 25-30% on TEE but in setting of afib with RVR -he put out 850cc yesterday and is net neg 6.6L -Creatinine improved at 2.06 (1.78>1.77>2.21>2.06). BMET pending this am -weight down 8lbs -K+ 4.1 yesterday - BMET pending this am -continue Carvedilol 25mg  BID, lasix 20mg  daily and Avapro 300mg  daily -change to Bidil 20-37.5mg  TID -repeat 2D echo in 1 month to see if LVF has improved  5.  DM2 -followed by PCP -HbA1C 8.4% -needs close followup outpt  -continue Glipizide 10mg  BID and SS Insulin -continue ARB  6.  CKD stage 3a -followed by PCP -creatinine improved with diuresis (1.78>1.77>2.21>2.06) -hopefully Cr will improve with diuresis and improving CO after restoring NSR -repeat BMET on Friday  Patient is stable for discharge home today.  Will arrange followup in office on Friday with Extender for BMET and check BP. Needs followup with PCP regarding DM.   I have spent a total of 35 minutes with patient reviewing hospital notes, 2D echo , telemetry, EKGs, labs and examining patient as well as establishing an assessment and plan that was discussed with the patient.  > 50% of time was spent in direct patient care.    Signed:   Fransico Him, MD [place]  07/18/2019

## 2019-07-20 LAB — ALDOSTERONE + RENIN ACTIVITY W/O RATIO
ALDOSTERONE: 3.4 ng/dL (ref 0.0–30.0)
Renin: 0.59 ng/mL/hr (ref 0.167–5.380)

## 2019-07-25 ENCOUNTER — Encounter: Payer: Self-pay | Admitting: Cardiology

## 2019-07-25 ENCOUNTER — Ambulatory Visit: Payer: BC Managed Care – PPO | Admitting: Cardiology

## 2019-07-25 ENCOUNTER — Encounter: Payer: Self-pay | Admitting: *Deleted

## 2019-07-25 ENCOUNTER — Other Ambulatory Visit: Payer: Self-pay

## 2019-07-25 VITALS — BP 148/88 | HR 72 | Ht 77.0 in | Wt 311.2 lb

## 2019-07-25 DIAGNOSIS — E119 Type 2 diabetes mellitus without complications: Secondary | ICD-10-CM

## 2019-07-25 DIAGNOSIS — I4892 Unspecified atrial flutter: Secondary | ICD-10-CM

## 2019-07-25 DIAGNOSIS — N179 Acute kidney failure, unspecified: Secondary | ICD-10-CM

## 2019-07-25 DIAGNOSIS — Z6836 Body mass index (BMI) 36.0-36.9, adult: Secondary | ICD-10-CM

## 2019-07-25 DIAGNOSIS — E1169 Type 2 diabetes mellitus with other specified complication: Secondary | ICD-10-CM

## 2019-07-25 DIAGNOSIS — I10 Essential (primary) hypertension: Secondary | ICD-10-CM | POA: Diagnosis not present

## 2019-07-25 DIAGNOSIS — E6609 Other obesity due to excess calories: Secondary | ICD-10-CM

## 2019-07-25 DIAGNOSIS — G4733 Obstructive sleep apnea (adult) (pediatric): Secondary | ICD-10-CM

## 2019-07-25 DIAGNOSIS — I5043 Acute on chronic combined systolic (congestive) and diastolic (congestive) heart failure: Secondary | ICD-10-CM | POA: Diagnosis not present

## 2019-07-25 DIAGNOSIS — N183 Chronic kidney disease, stage 3 unspecified: Secondary | ICD-10-CM

## 2019-07-25 DIAGNOSIS — E785 Hyperlipidemia, unspecified: Secondary | ICD-10-CM

## 2019-07-25 MED ORDER — APIXABAN 5 MG PO TABS: 5.0000 mg | ORAL_TABLET | Freq: Two times a day (BID) | ORAL | 3 refills | Status: DC

## 2019-07-25 NOTE — Progress Notes (Signed)
Cardiology Office Note:    Date:  07/25/2019   ID:  Johnny [name], DOB Apr 01, 1956, MRN [id number]  PCP:  [name], MD  Cardiologist:  Fransico Him, MD  Referring MD: [name], *   Chief Complaint  Patient presents with  . Hospitalization Follow-up  . Atrial Flutter    History of Present Illness:    Johnny [name] is a 64 y.o. male with a past medical history significant for CKD stage 3, DM2, HTN, HLD, medical noncompliance who is being seen today for hospital follow-up.  He was admitted to the hospital on 07/14/2019 after being found to be tachycardic by his PCP with heart rate of 140 bpm.  EKG showed atrial flutter with RVR.  The patient noted that he was a very active person and had never had any issues until about 6 weeks prior to admission when he started having dyspnea on exertion and had to sit down to catch his breath during activities.  He also had increased fatigue.  He works 10-hour days, getting up at 2 AM to be at work at 3 AM.  He was treated initially with Cardizem drip and continued on home carvedilol.  He was started on Eliquis 5 mg twice daily for CHA2DS2-VASc score of 2 (HTN, DM). Echocardiogram (done with HR of 130) showed EF 35-40% with global hypokinesis, mild LVH, moderately dilated left atrium and dilated IVC.  TSH was 1.5.  He continued in rapid atrial flutter with difficult to control rates despite IV diltiazem and Coreg increased to 25 mg twice daily.  He was started on an amiodarone drip but developed significant bradycardia with heart rates in the 40s-50s and marked hypotension requiring cessation of IV amnio.  He underwent successful TEE guided electrical cardioversion on 07/18/19.    He had mild volume excess and was diuresed with lasix 20 mg X 2 doses with net -6.6 L and weight down 8 pounds. He was continued on carvedilol 25 mg twice daily, lasix 20 mg daily and Avapro 300 mg daily.  Hydralazine was changed to BiDil 20-37.5 mg 3  times daily.  Plan to repeat echo in 1 month after maintaining sinus rhythm to see if LV function improved.   Johnny [name] is here today for hospital follow up alone. He is feeling well except that he has been having loose BM since the hospital, 2-3 per day. He had normal BMs in the hospital. He is having no palpitations, chest discomfort, or shortness of breath. His lower leg swelling is reportedly much better than it was prior to the hospital. He is now on lasix. He denies orthopnea or PND. He has some brief lightheadedness after he takes his morning meds but this passes. He  Has not been unstable or fallen. He knows that he needs to just be careful and rise slowly.   He does admit to snoring and wakes himself up often. He also says that his oxygen dropped at night while in the hospital.   He is having no unusual bleeding.   He works on Research officer, trade [employer] and does not have a very strenuous job or use any dangerous equipment.   Cardiac studies   DC Cardioversion 07/18/2019 Procedure Details Consent:Obtained Time TIR:[id number] patient identification, verified procedure, site/side was marked, verified correct patient position, special equipment/implants available, Radiology Safety Procedures followed, medications/allergies/relevent history reviewed, required imaging and test results available. Performed  The patient has been on adequate anticoagulation. The patient received IVpropofol 250 mg IV, metoprolol 10  mg IV, cardizem 10 mg IV for sedation. Synchronous cardioversion was performed at 120joules.  The cardioversion wassuccessful.  Complications:No apparent complications Patientdidtolerate procedure well. _____________  TEE 07/18/2019 IMPRESSIONS   1. Left ventricular ejection fraction, by estimation, is 25 to 30%. The  left ventricle has severely decreased function. The left ventricle has no  regional wall motion abnormalities. Left ventricular diastolic function  could not  be evaluated.  2. Right ventricular systolic function is normal. The right ventricular  size is normal.  3. Left atrial size was moderately dilated. No left atrial/left atrial  appendage thrombus was detected.  4. Right atrial size was moderately dilated.  5. The mitral valve is normal in structure. Mild mitral valve  regurgitation. No evidence of mitral stenosis.  6. Tricuspid valve regurgitation is moderate.  7. The aortic valve is normal in structure. Aortic valve regurgitation is  not visualized. No aortic stenosis is present.  8. The inferior vena cava is normal in size with greater than 50%  respiratory variability, suggesting right atrial pressure of 3 mmHg.   Echocardiogram 07/14/19 IMPRESSIONS  1. Heart rate 130 bpm throughout the exam. Suspect systolic function  would be higher at slower heart rate.  2. Left ventricular ejection fraction, by estimation, is 35 to 40%. The  left ventricle has moderately decreased function. The left ventricle  demonstrates global hypokinesis. There is mild concentric left ventricular  hypertrophy. Left ventricular  diastolic parameters are indeterminate.  3. Right ventricular systolic function is normal. The right ventricular  size is normal. There is mildly elevated pulmonary artery systolic  pressure.  4. Left atrial size was moderately dilated.  5. The mitral valve is normal in structure. Trivial mitral valve  regurgitation. No evidence of mitral stenosis.  6. The aortic valve is tricuspid. Aortic valve regurgitation is trivial.  No aortic stenosis is present.  7. The inferior vena cava is dilated in size with >50% respiratory  variability, suggesting right atrial pressure of 8 mmHg.      Past Medical History:  Diagnosis Date  . Atrial fibrillation ([place])   . CKD (chronic kidney disease) stage 3, GFR 30-59 ml/min 06/10/2017  . Diabetes mellitus   . Dyslipidemia 08/20/2018  . Essential hypertension 08/03/2007   Qualifier:  Diagnosis of  By: Linda Hedges MD, Heinz Knuckles  Med ARB, diuretic    . Guttate psoriasis   . HTN (hypertension)   . Hyperlipidemia   . Hyperlipidemia associated with type 2 diabetes mellitus ([place]) 08/03/2007   Qualifier: Diagnosis of  By: Linda Hedges MD, Heinz Knuckles  meds - none   . 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 Type II diabetes mellitus with renal manifestations, uncontrolled ([place]) 08/03/2007   Qualifier: Diagnosis of  By: Linda Hedges MD, Heinz Knuckles  Med metformin     Past Surgical History:  Procedure Laterality Date  . CARDIOVERSION N/A 07/18/2019   Procedure: CARDIOVERSION;  Surgeon: Dorothy Spark, MD;  Location: [facility] ENDOSCOPY;  Service: Cardiovascular;  Laterality: N/A;  . TEE WITHOUT CARDIOVERSION N/A 07/18/2019   Procedure: TRANSESOPHAGEAL ECHOCARDIOGRAM (TEE);  Surgeon: Dorothy Spark, MD;  Location[facility] ENDOSCOPY;  Service: Cardiovascular;  Laterality: N/A;  . TOOTH EXTRACTION      Current Medications: Current Meds  Medication Sig  . apixaban (ELIQUIS) 5 MG TABS tablet Take 1 tablet (5 mg total) by mouth 2 (two) times daily.  Marland Kitchen atorvastatin (LIPITOR) 80 MG tablet Take 1 tablet (80 mg total) by  mouth daily.  . carvedilol (COREG) 25 MG tablet Take 1 tablet (25 mg total) by mouth 2 (two) times daily with a meal.  . furosemide (LASIX) 20 MG tablet Take 1 tablet (20 mg total) by mouth daily.  Marland Kitchen glipiZIDE (GLUCOTROL) 10 MG tablet Take 10 mg by mouth 2 (two) times daily.  . irbesartan (AVAPRO) 300 MG tablet Take 1 tablet (300 mg total) by mouth daily.  . isosorbide-hydrALAZINE (BIDIL) 20-37.5 MG tablet Take 1 tablet by mouth 3 (three) times daily.  . metFORMIN (GLUCOPHAGE-XR) 500 MG 24 hr tablet TAKE 1 TABLET BY MOUTH TWICE A DAY  . [DISCONTINUED] apixaban (ELIQUIS) 5 MG TABS tablet Take 1 tablet (5 mg total) by mouth 2 (two) times daily.     Allergies:   Patient has no known allergies.   Social History   Socioeconomic History  . Marital  status: Married    Spouse name: Not on file  . Number of children: 2  . Years of education: 88  . Highest education level: Not on file  Occupational History  . Occupation: mfg  Tobacco Use  . Smoking status: Passive Smoke Exposure - Never Smoker  . Smokeless tobacco: Never Used  Substance and Sexual Activity  . Alcohol use: No  . Drug use: No  . Sexual activity: Yes  Other Topics Concern  . Not on file  Social History Narrative   [employer]. [employer] x 4 years. Married '94. 2 sons - '95, '96. Wife is healthy. Work - Banker.   Social Determinants of Radio broadcast assistant Strain:   . Difficulty of Paying Living Expenses:   Food Insecurity:   . Worried About Charity fundraiser in the Last Year:   . Arboriculturist in the Last Year:   Transportation Needs:   . Film/video editor (Medical):   Marland Kitchen Lack of Transportation (Non-Medical):   Physical Activity:   . Days of Exercise per Week:   . Minutes of Exercise per Session:   Stress:   . Feeling of Stress :   Social Connections:   . Frequency of Communication with Friends and Family:   . Frequency of Social Gatherings with Friends and Family:   . Attends Religious Services:   . Active Member of Clubs or Organizations:   . Attends Archivist Meetings:   Marland Kitchen Marital Status:      Family History: The patient's family history includes Cancer in his brother; Coronary artery disease in his father; Dementia in his mother; Diabetes in his father; HIV in his brother; Hyperlipidemia in his father; Hypertension in his father; Kidney disease in his father and mother; Memory loss in his mother; Nephrolithiasis in his father. There is no history of Prostate cancer or Colon cancer. ROS:   Please see the history of present illness.     All other systems reviewed and are negative.   EKG:  EKG is ordered today.  The ekg ordered today demonstrates NSR, 80 bpm. QTC 449.   Recent Labs: 07/14/2019: ALT 51; TSH  1.579 07/17/2019: Magnesium 2.0 07/18/2019: Hemoglobin 13.7; Platelets 281 07/19/2019: BUN 22; Creatinine, Ser 1.96; Potassium 4.0; Sodium 141   Recent Lipid Panel    Component Value Date/Time   CHOL 213 (H) 07/15/2019 0408   TRIG 66 07/15/2019 0408   TRIG 63 05/01/2006 1329   HDL 34 (L) 07/15/2019 0408   CHOLHDL 6.3 07/15/2019 0408   VLDL 13 07/15/2019 0408   LDLCALC 166 (H) 07/15/2019  0408   LDLDIRECT 170.2 11/19/2012 1105    Physical Exam:    VS:  BP (!) 148/88   Pulse 72   Ht 6\' 5"  (1.956 m)   Wt (!) 311 lb 4 oz (141.2 kg)   SpO2 97%   BMI 36.91 kg/m     Wt Readings from Last 6 Encounters:  07/25/19 (!) 311 lb 4 oz (141.2 kg)  07/19/19 (!) 314 lb 13.1 oz (142.8 kg)  07/14/19 (!) 330 lb 3.2 oz (149.8 kg)  07/01/19 (!) 325 lb 6.4 oz (147.6 kg)  03/25/19 (!) 321 lb 6.4 oz (145.8 kg)  12/31/18 (!) 305 lb (138.3 kg)     Physical Exam  Constitutional: He is oriented to person, place, and time. He appears well-developed and well-nourished. No distress.  HENT:  Head: Normocephalic and atraumatic.  Neck: No JVD present.  Cardiovascular: Normal rate, regular rhythm, normal heart sounds and intact distal pulses. Exam reveals no gallop and no friction rub.  No murmur heard. Pulmonary/Chest: Effort normal and breath sounds normal. No respiratory distress. He has no wheezes. He has no rales.  Abdominal: Soft. Bowel sounds are normal.  Musculoskeletal:        General: Edema present. Normal range of motion.     Cervical back: Normal range of motion and neck supple.     Comments: Trace taught edema of bilateral lower legs  Neurological: He is alert and oriented to person, place, and time.  Skin: Skin is warm and dry.  Psychiatric: He has a normal mood and affect. His behavior is normal. Judgment and thought content normal.  Vitals reviewed.    ASSESSMENT:    1. Atrial flutter with rapid ventricular response ([place])   2. Essential hypertension   3. Acute on chronic combined  systolic and diastolic CHF (congestive heart failure) ([place])   4. AKI (acute kidney injury) ([place])   5. Stage 3 chronic kidney disease, unspecified whether stage 3a or 3b CKD   6. DM type 2, goal HbA1c < 7% (H[facility])   7. Hyperlipidemia associated with type 2 diabetes mellitus ([place])    PLAN:    In order of problems listed above:  Atrial flutter with RVR -Admitted to the hospital 3/11-3/16/21.  Did not tolerate amiodarone.  Underwent TEE guided cardioversion to normal sinus rhythm.   -Continued on carvedilol 25 mg twice daily. -Continues on Eliquis 5 mg twice daily, no unusual bleeding. Pt has $10 copay card. Rx sent to his usual pharmacy.  -Maintaining sinus rhythm.   Hypertension -BP was elevated in the hospital but improved -Continued on carvedilol 25 mg twice daily, Avapro 300 mg daily.  Hydralazine switched to BiDil 20-37.5 mg 3 times daily in setting of reduced EF. -BP slightly elevated. At home has been in 130's/80's. Pt advised that optimal BP is <130/80. He says that this is the best bloodpressure that he has had in years. He does not want to make any changes at this time.   Acute systolic CHF/dilated cardiomyopathy -EF 35-40% by echo on recent admission.  Likely tachycardia induced. -Plan to repeat echocardiogram in 1 month after maintaining sinus rhythm.  AKI on CKD stage III -Creatinine was up to 2.21 on recent admission, 2.06 at discharge. -We will check metabolic panel today.  Diabetes type 2 -Followed by PCP.  On Metformin and glipizide.  -Hemoglobin A1c 8.4%.  Needs better blood sugar control. We discussed diet and exercise.   Hyperlipidemia -Lipids were elevated with LDL of 166.  The patient was  started on atorvastatin 80 mg daily. -We will check lipids and ALT in 6 weeks.  Obstructive sleep apnea -Pt snores and wakes himself up. He also has body habitus that puts him at risk for sleep apnea.  -Now with atrial flutter, it would be important to identify and treat sleep  apnea. I will order a split night sleep study.   Obesity -Body mass index is 36.91 kg/m. -We discussed wt loss with diet and exercise. He says that he has already made changes in his diet and has lost from 327 lbs to 309 lbs. -Strongly encouraged to continue his efforts.   Medication Adjustments/Labs and Tests Ordered: Current medicines are reviewed at length with the patient today.  Concerns regarding medicines are outlined above. Labs and tests ordered and medication changes are outlined in the patient instructions below:  Patient Instructions  Medication Instructions:  Your physician recommends that you continue on your current medications as directed. Please refer to the Current Medication list given to you today.  We have sent in your Eliquis to [facility].   *If you need a refill on your cardiac medications before your next appointment, please call your pharmacy*   Lab Work: TODAY:  BMET  6 WEEKS:  09/05/2019:  COME TO THE LAB FOR LIPID / LFT.Marland Kitchen NOTHING TO EAT OR DRINK AFTER MIDNIGHT THE NIGHT BEFORE  If you have labs (blood work) drawn today and your tests are completely normal, you will receive your results only by: Marland Kitchen MyChart Message (if you have MyChart) OR . A paper copy in the mail If you have any lab test that is abnormal or we need to change your treatment, we will call you to review the results.   Testing/Procedures: Your physician has requested that you have an echocardiogram 08/19/2019 ARRIVE AT 7:20 FOR THIS.   Echocardiography is a painless test that uses sound waves to create images of your heart. It provides your doctor with information about the size and shape of your heart and how well your heart's chambers and valves are working. This procedure takes approximately one hour. There are no restrictions for this procedure.   Your physician has recommended that you have a sleep study.SOMEONE WILL CALL YOU TO SCHEDULE   This test records several body functions during sleep,  including: brain activity, eye movement, oxygen and carbon dioxide blood levels, heart rate and rhythm, breathing rate and rhythm, the flow of air through your mouth and nose, snoring, body muscle movements, and chest and belly movement.    Follow-Up: At [facility], you and your health needs are our priority.  As part of our continuing mission to provide you with exceptional heart care, we have created designated Provider Care Teams.  These Care Teams include your primary Cardiologist (physician) and Advanced Practice Providers (APPs -  Physician Assistants and Nurse Practitioners) who all work together to provide you with the care you need, when you need it.  We recommend signing up for the patient portal called "MyChart".  Sign up information is provided on this After Visit Summary.  MyChart is used to connect with patients for Virtual Visits (Telemedicine).  Patients are able to view lab/test results, encounter notes, upcoming appointments, etc.  Non-urgent messages can be sent to your provider as well.   To learn more about what you can do with MyChart, go to [email or web address].    Your next appointment:   4 month(s)  The format for your next appointment:   In Person  Provider:  You may see Fransico Him, MD or one of the following Advanced Practice Providers on your designated Care Team:    Melina Copa, PA-C  Ermalinda Barrios, PA-C    Other Instructions  Echocardiogram An echocardiogram is a procedure that uses painless sound waves (ultrasound) to produce an image of the heart. Images from an echocardiogram can provide important information about:  Signs of coronary artery disease (CAD).  Aneurysm detection. An aneurysm is a weak or damaged part of an artery wall that bulges out from the normal force of blood pumping through the body.  Heart size and shape. Changes in the size or shape of the heart can be associated with certain conditions, including heart failure,  aneurysm, and CAD.  Heart muscle function.  Heart valve function.  Signs of a past heart attack.  Fluid buildup around the heart.  Thickening of the heart muscle.  A tumor or infectious growth around the heart valves. Tell a health care provider about:  Any allergies you have.  All medicines you are taking, including vitamins, herbs, eye drops, creams, and over-the-counter medicines.  Any blood disorders you have.  Any surgeries you have had.  Any medical conditions you have.  Whether you are pregnant or may be pregnant. What are the risks? Generally, this is a safe procedure. However, problems may occur, including:  Allergic reaction to dye (contrast) that may be used during the procedure. What happens before the procedure? No specific preparation is needed. You may eat and drink normally. What happens during the procedure?   An IV tube may be inserted into one of your veins.  You may receive contrast through this tube. A contrast is an injection that improves the quality of the pictures from your heart.  A gel will be applied to your chest.  A wand-like tool (transducer) will be moved over your chest. The gel will help to transmit the sound waves from the transducer.  The sound waves will harmlessly bounce off of your heart to allow the heart images to be captured in real-time motion. The images will be recorded on a computer. The procedure may vary among health care providers and hospitals. What happens after the procedure?  You may return to your normal, everyday life, including diet, activities, and medicines, unless your health care provider tells you not to do that. Summary  An echocardiogram is a procedure that uses painless sound waves (ultrasound) to produce an image of the heart.  Images from an echocardiogram can provide important information about the size and shape of your heart, heart muscle function, heart valve function, and fluid buildup around  your heart.  You do not need to do anything to prepare before this procedure. You may eat and drink normally.  After the echocardiogram is completed, you may return to your normal, everyday life, unless your health care provider tells you not to do that. This information is not intended to replace advice given to you by your health care provider. Make sure you discuss any questions you have with your health care provider. Document Revised: 08/12/2018 Document Reviewed: 05/24/2016 Elsevier Patient Education  [place].   Sleep Studies A sleep study (polysomnogram) is a series of tests done while you are sleeping. A sleep study records your brain waves, heart rate, breathing rate, oxygen level, and eye and leg movements. A sleep study helps your health care provider:  See how well you sleep.  Diagnose a sleep disorder.  Determine how severe your sleep disorder  is.  Create a plan to treat your sleep disorder. Your health care provider may recommend a sleep study if you:  Feel sleepy on most days.  Snore loudly while sleeping.  Have unusual behaviors while you sleep, such as walking.  Have brief periods in which you stop breathing during sleep (sleepapnea).  Fall asleep suddenly during the day (narcolepsy).  Have trouble falling asleep or staying asleep (insomnia).  Feel like you need to move your legs when trying to fall asleep (restless legs syndrome).  Move your legs by flexing and extending them regularly while asleep (periodic limb movement disorder).  Act out your dreams while you sleep (sleep behavior disorder).  Feel like you cannot move when you first wake up (sleep paralysis). What tests are part of a sleep study? Most sleep studies record the following during sleep:  Brain activity.  Eye movements.  Heart rate and rhythm.  Breathing rate and rhythm.  Blood-oxygen level.  Blood pressure.  Chest and belly movement as you breathe.  Arm and leg  movements.  Snoring or other noises.  Body position. Where are sleep studies done? Sleep studies are done at sleep centers. A sleep center may be inside a hospital, office, or clinic. The room where you have the study may look like a hospital room or a hotel room. The health care providers doing the study may come in and out of the room during the study. Most of the time, they will be in another room monitoring your test as you sleep. How are sleep studies done? Most sleep studies are done during a normal period of time for a full night of sleep. You will arrive at the study center in the evening and go home in the morning. Before the test  Bring your pajamas and toothbrush with you to the sleep study.  Do not have caffeine on the day of your sleep study.  Do not drink alcohol on the day of your sleep study.  Your health care provider will let you know if you should stop taking any of your regular medicines before the test. During the test      Round, sticky patches with sensors attached to recording wires (electrodes) are placed on your scalp, face, chest, and limbs.  Wires from all the electrodes and sensors run from your bed to a computer. The wires can be taken off and put back on if you need to get out of bed to go to the bathroom.  A sensor is placed over your nose to measure airflow.  A finger clip is put on your finger or ear to measure your blood oxygen level (pulse oximetry).  A belt is placed around your belly and a belt is placed around your chest to measure breathing movements.  If you have signs of the sleep disorder called sleep apnea during your test, you may get a treatment mask to wear for the second half of the night. ? The mask provides positive airway pressure (PAP) to help you breathe better during sleep. This may greatly improve your sleep apnea. ? You will then have all tests done again with the mask in place to see if your measurements and recordings  change. After the test  A medical doctor who specializes in sleep will evaluate the results of your sleep study and share them with you and your primary health care provider.  Based on your results, your medical history, and a physical exam, you may be diagnosed with a sleep  disorder, such as: ? Sleep apnea. ? Restless legs syndrome. ? Sleep-related behavior disorder. ? Sleep-related movement disorders. ? Sleep-related seizure disorders.  Your health care team will help determine your treatment options based on your diagnosis. This may include: ? Improving your sleep habits (sleep hygiene). ? Wearing a continuous positive airway pressure (CPAP) or bi-level positive airway pressure (BPAP) mask. ? Wearing an oral device at night to improve breathing and reduce snoring. ? Taking medicines. Follow these instructions at home:  Take over-the-counter and prescription medicines only as told by your health care provider.  If you are instructed to use a CPAP or BPAP mask, make sure you use it nightly as directed.  Make any lifestyle changes that your health care provider recommends.  If you were given a device to open your airway while you sleep, use it only as told by your health care provider.  Do not use any tobacco products, such as cigarettes, chewing tobacco, and e-cigarettes. If you need help quitting, ask your health care provider.  Keep all follow-up visits as told by your health care provider. This is important. Summary  A sleep study (polysomnogram) is a series of tests done while you are sleeping. It shows how well you sleep.  Most sleep studies are done over one full night of sleep. You will arrive at the study center in the evening and go home in the morning.  If you have signs of the sleep disorder called sleep apnea during your test, you may get a treatment mask to wear for the second half of the night.  A medical doctor who specializes in sleep will evaluate the results  of your sleep study and share them with your primary health care provider. This information is not intended to replace advice given to you by your health care provider. Make sure you discuss any questions you have with your health care provider. Document Revised: 10/06/2018 Document Reviewed: 05/19/2017 Elsevier Patient Education  2020 [place] Modifications to Prevent and Treat Heart Disease -Recommend heart healthy/Mediterranean diet, with whole grains, fruits, vegetables, fish, lean meats, nuts, olive oil and avocado oil.  -Limit salt intake to less than 2000 mg per day.  -Recommend moderate walking, starting slowly with a few minutes and working up to 3-5 times/week for 30-50 minutes each session. Aim for at least 150 minutes.week. Goal should be pace of 3 miles/hours, or walking 1.5 miles in 30 minutes -Recommend avoidance of tobacco products. Avoid excess alcohol. -Keep blood pressure well controlled, ideally less than 130/80.      Signed, Daune Perch, NP  07/25/2019 4:26 PM    [facility]

## 2019-07-25 NOTE — Patient Instructions (Addendum)
Medication Instructions:  Your physician recommends that you continue on your current medications as directed. Please refer to the Current Medication list given to you today.  We have sent in your [facility] to [facility].   *If you need a refill on your cardiac medications before your next appointment, please call your pharmacy*   Lab Work: TODAY:  BMET  6 WEEKS:  09/05/2019:  COME TO THE LAB FOR LIPID / LFT.Marland Kitchen NOTHING TO EAT OR DRINK AFTER MIDNIGHT THE NIGHT BEFORE  If you have labs (blood work) drawn today and your tests are completely normal, you will receive your results only by: Marland Kitchen MyChart Message (if you have MyChart) OR . A paper copy in the mail If you have any lab test that is abnormal or we need to change your treatment, we will call you to review the results.   Testing/Procedures: Your physician has requested that you have an echocardiogram 08/19/2019 ARRIVE AT 7:20 FOR THIS.   Echocardiography is a painless test that uses sound waves to create images of your heart. It provides your doctor with information about the size and shape of your heart and how well your heart's chambers and valves are working. This procedure takes approximately one hour. There are no restrictions for this procedure.   Your physician has recommended that you have a sleep study.SOMEONE WILL CALL YOU TO SCHEDULE   This test records several body functions during sleep, including: brain activity, eye movement, oxygen and carbon dioxide blood levels, heart rate and rhythm, breathing rate and rhythm, the flow of air through your mouth and nose, snoring, body muscle movements, and chest and belly movement.    Follow-Up: At [facility], you and your health needs are our priority.  As part of our continuing mission to provide you with exceptional heart care, we have created designated Provider Care Teams.  These Care Teams include your primary Cardiologist (physician) and Advanced Practice Providers (APPs -  Physician  Assistants and Nurse Practitioners) who all work together to provide you with the care you need, when you need it.  We recommend signing up for the patient portal called "MyChart".  Sign up information is provided on this After Visit Summary.  MyChart is used to connect with patients for Virtual Visits (Telemedicine).  Patients are able to view lab/test results, encounter notes, upcoming appointments, etc.  Non-urgent messages can be sent to your provider as well.   To learn more about what you can do with MyChart, go to [email or web address].    Your next appointment:   4 month(s)  The format for your next appointment:   In Person  Provider:   You may see Fransico Him, MD or one of the following Advanced Practice Providers on your designated Care Team:    Melina Copa, PA-C  Ermalinda Barrios, PA-C    Other Instructions  Echocardiogram An echocardiogram is a procedure that uses painless sound waves (ultrasound) to produce an image of the heart. Images from an echocardiogram can provide important information about:  Signs of coronary artery disease (CAD).  Aneurysm detection. An aneurysm is a weak or damaged part of an artery wall that bulges out from the normal force of blood pumping through the body.  Heart size and shape. Changes in the size or shape of the heart can be associated with certain conditions, including heart failure, aneurysm, and CAD.  Heart muscle function.  Heart valve function.  Signs of a past heart attack.  Fluid buildup around the heart.  Thickening of the heart muscle.  A tumor or infectious growth around the heart valves. Tell a health care provider about:  Any allergies you have.  All medicines you are taking, including vitamins, herbs, eye drops, creams, and over-the-counter medicines.  Any blood disorders you have.  Any surgeries you have had.  Any medical conditions you have.  Whether you are pregnant or may be pregnant. What are the  risks? Generally, this is a safe procedure. However, problems may occur, including:  Allergic reaction to dye (contrast) that may be used during the procedure. What happens before the procedure? No specific preparation is needed. You may eat and drink normally. What happens during the procedure?   An IV tube may be inserted into one of your veins.  You may receive contrast through this tube. A contrast is an injection that improves the quality of the pictures from your heart.  A gel will be applied to your chest.  A wand-like tool (transducer) will be moved over your chest. The gel will help to transmit the sound waves from the transducer.  The sound waves will harmlessly bounce off of your heart to allow the heart images to be captured in real-time motion. The images will be recorded on a computer. The procedure may vary among health care providers and hospitals. What happens after the procedure?  You may return to your normal, everyday life, including diet, activities, and medicines, unless your health care provider tells you not to do that. Summary  An echocardiogram is a procedure that uses painless sound waves (ultrasound) to produce an image of the heart.  Images from an echocardiogram can provide important information about the size and shape of your heart, heart muscle function, heart valve function, and fluid buildup around your heart.  You do not need to do anything to prepare before this procedure. You may eat and drink normally.  After the echocardiogram is completed, you may return to your normal, everyday life, unless your health care provider tells you not to do that. This information is not intended to replace advice given to you by your health care provider. Make sure you discuss any questions you have with your health care provider. Document Revised: 08/12/2018 Document Reviewed: 05/24/2016 Elsevier Patient Education  [place].   Sleep Studies A sleep  study (polysomnogram) is a series of tests done while you are sleeping. A sleep study records your brain waves, heart rate, breathing rate, oxygen level, and eye and leg movements. A sleep study helps your health care provider:  See how well you sleep.  Diagnose a sleep disorder.  Determine how severe your sleep disorder is.  Create a plan to treat your sleep disorder. Your health care provider may recommend a sleep study if you:  Feel sleepy on most days.  Snore loudly while sleeping.  Have unusual behaviors while you sleep, such as walking.  Have brief periods in which you stop breathing during sleep (sleepapnea).  Fall asleep suddenly during the day (narcolepsy).  Have trouble falling asleep or staying asleep (insomnia).  Feel like you need to move your legs when trying to fall asleep (restless legs syndrome).  Move your legs by flexing and extending them regularly while asleep (periodic limb movement disorder).  Act out your dreams while you sleep (sleep behavior disorder).  Feel like you cannot move when you first wake up (sleep paralysis). What tests are part of a sleep study? Most sleep studies record the following during sleep:  Brain  activity.  Eye movements.  Heart rate and rhythm.  Breathing rate and rhythm.  Blood-oxygen level.  Blood pressure.  Chest and belly movement as you breathe.  Arm and leg movements.  Snoring or other noises.  Body position. Where are sleep studies done? Sleep studies are done at sleep centers. A sleep center may be inside a hospital, office, or clinic. The room where you have the study may look like a hospital room or a hotel room. The health care providers doing the study may come in and out of the room during the study. Most of the time, they will be in another room monitoring your test as you sleep. How are sleep studies done? Most sleep studies are done during a normal period of time for a full night of sleep. You  will arrive at the study center in the evening and go home in the morning. Before the test  Bring your pajamas and toothbrush with you to the sleep study.  Do not have caffeine on the day of your sleep study.  Do not drink alcohol on the day of your sleep study.  Your health care provider will let you know if you should stop taking any of your regular medicines before the test. During the test      Round, sticky patches with sensors attached to recording wires (electrodes) are placed on your scalp, face, chest, and limbs.  Wires from all the electrodes and sensors run from your bed to a computer. The wires can be taken off and put back on if you need to get out of bed to go to the bathroom.  A sensor is placed over your nose to measure airflow.  A finger clip is put on your finger or ear to measure your blood oxygen level (pulse oximetry).  A belt is placed around your belly and a belt is placed around your chest to measure breathing movements.  If you have signs of the sleep disorder called sleep apnea during your test, you may get a treatment mask to wear for the second half of the night. ? The mask provides positive airway pressure (PAP) to help you breathe better during sleep. This may greatly improve your sleep apnea. ? You will then have all tests done again with the mask in place to see if your measurements and recordings change. After the test  A medical doctor who specializes in sleep will evaluate the results of your sleep study and share them with you and your primary health care provider.  Based on your results, your medical history, and a physical exam, you may be diagnosed with a sleep disorder, such as: ? Sleep apnea. ? Restless legs syndrome. ? Sleep-related behavior disorder. ? Sleep-related movement disorders. ? Sleep-related seizure disorders.  Your health care team will help determine your treatment options based on your diagnosis. This may  include: ? Improving your sleep habits (sleep hygiene). ? Wearing a continuous positive airway pressure (CPAP) or bi-level positive airway pressure (BPAP) mask. ? Wearing an oral device at night to improve breathing and reduce snoring. ? Taking medicines. Follow these instructions at home:  Take over-the-counter and prescription medicines only as told by your health care provider.  If you are instructed to use a CPAP or BPAP mask, make sure you use it nightly as directed.  Make any lifestyle changes that your health care provider recommends.  If you were given a device to open your airway while you sleep, use it only as  told by your health care provider.  Do not use any tobacco products, such as cigarettes, chewing tobacco, and e-cigarettes. If you need help quitting, ask your health care provider.  Keep all follow-up visits as told by your health care provider. This is important. Summary  A sleep study (polysomnogram) is a series of tests done while you are sleeping. It shows how well you sleep.  Most sleep studies are done over one full night of sleep. You will arrive at the study center in the evening and go home in the morning.  If you have signs of the sleep disorder called sleep apnea during your test, you may get a treatment mask to wear for the second half of the night.  A medical doctor who specializes in sleep will evaluate the results of your sleep study and share them with your primary health care provider. This information is not intended to replace advice given to you by your health care provider. Make sure you discuss any questions you have with your health care provider. Document Revised: 10/06/2018 Document Reviewed: 05/19/2017 Elsevier Patient Education  2020 [place] Modifications to Prevent and Treat Heart Disease -Recommend heart healthy/Mediterranean diet, with whole grains, fruits, vegetables, fish, lean meats, nuts, olive oil and avocado oil.   -Limit salt intake to less than 2000 mg per day.  -Recommend moderate walking, starting slowly with a few minutes and working up to 3-5 times/week for 30-50 minutes each session. Aim for at least 150 minutes.week. Goal should be pace of 3 miles/hours, or walking 1.5 miles in 30 minutes -Recommend avoidance of tobacco products. Avoid excess alcohol. -Keep blood pressure well controlled, ideally less than 130/80.

## 2019-07-26 ENCOUNTER — Telehealth: Payer: Self-pay | Admitting: *Deleted

## 2019-07-26 LAB — BASIC METABOLIC PANEL
BUN/Creatinine Ratio: 11 (ref 10–24)
BUN: 21 mg/dL (ref 8–27)
CO2: 21 mmol/L (ref 20–29)
Calcium: 9.2 mg/dL (ref 8.6–10.2)
Chloride: 111 mmol/L — ABNORMAL HIGH (ref 96–106)
Creatinine, Ser: 1.94 mg/dL — ABNORMAL HIGH (ref 0.76–1.27)
GFR calc Af Amer: 42 mL/min/{1.73_m2} — ABNORMAL LOW (ref >59)
GFR calc non Af Amer: 36 mL/min/{1.73_m2} — ABNORMAL LOW (ref >59)
Glucose: 121 mg/dL — ABNORMAL HIGH (ref 65–99)
Potassium: 4.5 mmol/L (ref 3.5–5.2)
Sodium: 145 mmol/L — ABNORMAL HIGH (ref 134–144)

## 2019-07-26 NOTE — Telephone Encounter (Signed)
-----   Message from Jeanann Lewandowsky, [place] sent at 07/25/2019  3:31 PM EDT ----- Regarding: Sleep Study Pt needs a sleep study.

## 2019-07-26 NOTE — Telephone Encounter (Signed)
Staff message sent to [facility] per [place] [employer] [place] web portal no PA is required for sleep study. Ok to schedule.

## 2019-07-29 ENCOUNTER — Telehealth: Payer: Self-pay

## 2019-07-29 DIAGNOSIS — N183 Chronic kidney disease, stage 3 unspecified: Secondary | ICD-10-CM

## 2019-07-29 NOTE — Telephone Encounter (Signed)
-----   Message from Daune Perch, NP sent at 07/27/2019  5:11 PM EDT ----- Kidney function is still impaired. Please have patient stop irbesartan and check BP daily for a week and call with results (to Dr. Radford Pax). Place nephrology referral for chronic kidney disease.

## 2019-08-01 ENCOUNTER — Other Ambulatory Visit (HOSPITAL_BASED_OUTPATIENT_CLINIC_OR_DEPARTMENT_OTHER): Payer: Self-pay

## 2019-08-02 ENCOUNTER — Telehealth: Payer: Self-pay | Admitting: *Deleted

## 2019-08-02 NOTE — Telephone Encounter (Signed)
Patient is scheduled for lab study on 08/15/19. Pt is scheduled for COVID screening on 08/12/19 2:50  prior to SS. Patient understands his sleep study will be done at [facility]. Patient understands he will receive a sleep packet in a week or so. Patient understands to call if he does not receive the sleep packet in a timely manner. Patient agrees with treatment and thanked me for call.

## 2019-08-02 NOTE — Telephone Encounter (Signed)
-----   Message from Lauralee Evener, [place] sent at 07/26/2019 11:42 AM EDT ----- Regarding: RE: Sleep Study Ok to schedule. Per [employer] no PA is required. ----- Message ----- From: Jeanann Lewandowsky, RMA Sent: 07/25/2019   3:31 PM EDT To: [facility] Sleep Studies Subject: Sleep Study                                    Pt needs a sleep study.

## 2019-08-12 ENCOUNTER — Other Ambulatory Visit: Payer: Self-pay

## 2019-08-12 ENCOUNTER — Other Ambulatory Visit: Payer: Self-pay | Admitting: Internal Medicine

## 2019-08-12 ENCOUNTER — Other Ambulatory Visit (HOSPITAL_COMMUNITY)
Admission: RE | Admit: 2019-08-12 | Discharge: 2019-08-12 | Disposition: A | Discharge status: Home / Self Care | Payer: BC Managed Care – PPO | Source: Ambulatory Visit | Attending: Cardiology | Admitting: Cardiology

## 2019-08-12 DIAGNOSIS — Z20822 Contact with and (suspected) exposure to covid-19: Secondary | ICD-10-CM | POA: Diagnosis not present

## 2019-08-12 DIAGNOSIS — Z01812 Encounter for preprocedural laboratory examination: Secondary | ICD-10-CM | POA: Insufficient documentation

## 2019-08-13 LAB — SARS CORONAVIRUS 2 (TAT 6-24 HRS): SARS Coronavirus 2: NEGATIVE

## 2019-08-15 ENCOUNTER — Ambulatory Visit: Payer: BC Managed Care – PPO | Attending: Cardiology | Admitting: Cardiology

## 2019-08-15 ENCOUNTER — Other Ambulatory Visit: Payer: Self-pay

## 2019-08-15 DIAGNOSIS — I4892 Unspecified atrial flutter: Secondary | ICD-10-CM | POA: Insufficient documentation

## 2019-08-15 DIAGNOSIS — E785 Hyperlipidemia, unspecified: Secondary | ICD-10-CM | POA: Insufficient documentation

## 2019-08-15 DIAGNOSIS — N179 Acute kidney failure, unspecified: Secondary | ICD-10-CM | POA: Diagnosis not present

## 2019-08-15 DIAGNOSIS — N183 Chronic kidney disease, stage 3 unspecified: Secondary | ICD-10-CM | POA: Insufficient documentation

## 2019-08-15 DIAGNOSIS — I10 Essential (primary) hypertension: Secondary | ICD-10-CM

## 2019-08-15 DIAGNOSIS — G4733 Obstructive sleep apnea (adult) (pediatric): Secondary | ICD-10-CM | POA: Insufficient documentation

## 2019-08-15 DIAGNOSIS — R0902 Hypoxemia: Secondary | ICD-10-CM | POA: Diagnosis not present

## 2019-08-15 DIAGNOSIS — I5043 Acute on chronic combined systolic (congestive) and diastolic (congestive) heart failure: Secondary | ICD-10-CM | POA: Diagnosis not present

## 2019-08-15 DIAGNOSIS — E1122 Type 2 diabetes mellitus with diabetic chronic kidney disease: Secondary | ICD-10-CM | POA: Diagnosis not present

## 2019-08-15 DIAGNOSIS — I13 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 1 through stage 4 chronic kidney disease, or unspecified chronic kidney disease: Secondary | ICD-10-CM | POA: Diagnosis not present

## 2019-08-15 DIAGNOSIS — E1169 Type 2 diabetes mellitus with other specified complication: Secondary | ICD-10-CM

## 2019-08-15 DIAGNOSIS — E119 Type 2 diabetes mellitus without complications: Secondary | ICD-10-CM

## 2019-08-16 NOTE — Procedures (Signed)
    Patient Name: Johnny [name], Study Date: 08/15/2019 Gender: Male D.O.B: 1955-11-07 Age (years): 53 Referring Provider: Daune Perch NP Height (inches): 77 Interpreting Physician: Fransico Him MD, ABSM Weight (lbs): 311 RPSGT: Rosebud Poles BMI: 37 MRN: [id number] Neck Size: 19.00  Sleep Study Type: NPSG  Indication for sleep study: N/A  Epworth Sleepiness Score: 3  SLEEP STUDY TECHNIQUE As per the AASM Manual for the Scoring of Sleep and Associated Events v2.3 (April 2016) with a hypopnea requiring 4% desaturations.  The channels recorded and monitored were frontal, central and occipital EEG, electrooculogram (EOG), submentalis EMG (chin), nasal and oral airflow, thoracic and abdominal wall motion, anterior tibialis EMG, snore microphone, electrocardiogram, and pulse oximetry.  MEDICATIONS Medications self-administered by patient taken the night of the study : N/A  SLEEP ARCHITECTURE The study was initiated at 9:31:16 PM and ended at 4:46:31 AM.  Sleep onset time was 27.2 minutes and the sleep efficiency was 45.8%. The total sleep time was 199.5 minutes.  Stage REM latency was N/A minutes.  The patient spent 30.8% of the night in stage N1 sleep, 68.4% in stage N2 sleep, 0.8%% in stage N3 and 0% in REM.  Alpha intrusion was absent.  Supine sleep was 67.42%.  RESPIRATORY PARAMETERS The overall apnea/hypopnea index (AHI) was 92.0 per hour. There were 90 total apneas, including 86 obstructive, 4 central and 0 mixed apneas. There were 216 hypopneas and 0 RERAs.  The AHI during Stage REM sleep was N/A per hour.  AHI while supine was 90.1 per hour.  The mean oxygen saturation was 92.7%. The minimum SpO2 during sleep was 85.0%.  loud snoring was noted during this study.  CARDIAC DATA The 2 lead EKG demonstrated sinus rhythm. The mean heart rate was 79.1 beats per minute. Other EKG findings include: PACs.  LEG MOVEMENT DATA The total PLMS were 0 with a resulting  PLMS index of 0.0. Associated arousal with leg movement index was 0.0 .  IMPRESSIONS - Severe obstructive sleep apnea occurred during this study (AHI = 92.0/h). - No significant central sleep apnea occurred during this study (CAI = 1.2/h). - Mild oxygen desaturation was noted during this study (Min O2 = 85.0%). - The patient snored with loud snoring volume. - PACs were noted during this study. - Clinically significant periodic limb movements did not occur during sleep. No significant associated arousals.  DIAGNOSIS - Obstructive Sleep Apnea (327.23 [G47.33 ICD-10]) - Nocturnal Hypoxemia (327.26 [G47.36 ICD-10])  RECOMMENDATIONS - Therapeutic CPAP titration to determine optimal pressure required to alleviate sleep disordered breathing. - Avoid alcohol, sedatives and other CNS depressants that may worsen sleep apnea and disrupt normal sleep architecture. - Sleep hygiene should be reviewed to assess factors that may improve sleep quality. - Weight management and regular exercise should be initiated or continued if appropriate.  [Electronically signed] 08/16/2019 03:08 PM  Fransico Him MD, [place], American Board of Sleep Medicine   NPI: [id number]

## 2019-08-19 ENCOUNTER — Ambulatory Visit (HOSPITAL_COMMUNITY): Payer: BC Managed Care – PPO | Attending: Cardiology

## 2019-08-19 ENCOUNTER — Other Ambulatory Visit: Payer: Self-pay

## 2019-08-19 DIAGNOSIS — I10 Essential (primary) hypertension: Secondary | ICD-10-CM

## 2019-08-19 DIAGNOSIS — E119 Type 2 diabetes mellitus without complications: Secondary | ICD-10-CM

## 2019-08-19 DIAGNOSIS — E1169 Type 2 diabetes mellitus with other specified complication: Secondary | ICD-10-CM | POA: Diagnosis present

## 2019-08-19 DIAGNOSIS — I5043 Acute on chronic combined systolic (congestive) and diastolic (congestive) heart failure: Secondary | ICD-10-CM

## 2019-08-19 DIAGNOSIS — N179 Acute kidney failure, unspecified: Secondary | ICD-10-CM

## 2019-08-19 DIAGNOSIS — I4892 Unspecified atrial flutter: Secondary | ICD-10-CM

## 2019-08-19 DIAGNOSIS — E785 Hyperlipidemia, unspecified: Secondary | ICD-10-CM | POA: Diagnosis present

## 2019-08-19 DIAGNOSIS — N183 Chronic kidney disease, stage 3 unspecified: Secondary | ICD-10-CM | POA: Diagnosis present

## 2019-08-22 ENCOUNTER — Telehealth: Payer: Self-pay

## 2019-08-22 NOTE — Telephone Encounter (Signed)
-----   Message from Theora Gianotti, NP sent at 08/22/2019  9:15 AM EDT ----- Heart squeezing function has normalized - now 60-65%.  No significant valvular abnormalities.  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  Overall, reassuring study and great news!

## 2019-08-22 NOTE — Telephone Encounter (Signed)
The patient has been notified of the Echo result and verbalized understanding.  All questions (if any) were answered. Frederik Schmidt, RN 08/22/2019 9:21 AM

## 2019-08-23 ENCOUNTER — Telehealth: Payer: Self-pay | Admitting: *Deleted

## 2019-08-23 DIAGNOSIS — I10 Essential (primary) hypertension: Secondary | ICD-10-CM

## 2019-08-23 DIAGNOSIS — G4733 Obstructive sleep apnea (adult) (pediatric): Secondary | ICD-10-CM

## 2019-08-23 NOTE — Telephone Encounter (Signed)
Informed patient of sleep study results and patient understanding was verbalized. Patient understands her sleep study showed they have sleep apnea and recommend CPAP titration. Please set up titration in the sleep lab.  Pt is aware and agreeable to his results. Titration sent to sleep pool.

## 2019-08-23 NOTE — Telephone Encounter (Signed)
-----   Message from Sueanne Margarita, MD sent at 08/16/2019  3:11 PM EDT ----- Please let patient know that they have sleep apnea and recommend CPAP titration. Please set up titration in the sleep lab.

## 2019-08-24 ENCOUNTER — Telehealth: Payer: Self-pay | Admitting: *Deleted

## 2019-08-24 NOTE — Telephone Encounter (Signed)
Staff message sent to [facility] ok to schedule CPAP titration. No PA is required. 

## 2019-08-29 NOTE — Telephone Encounter (Signed)
Patient is scheduled for CPAP Titration on 09/19/19. 09/19/19 is scheduled for COVID screening on 09/16/19 3 pm prior to titration.  Patient understands his titration study will be done at [facility]. Patient understands he will receive a letter in a week or so detailing appointment, date, time, and location. Patient understands to call if he does not receive the letter  in a timely manner. Patient agrees with treatment and thanked me for call.

## 2019-08-29 NOTE — Addendum Note (Signed)
Addended by: Freada Bergeron on: 08/29/2019 03:42 PM   Modules accepted: Orders

## 2019-08-30 ENCOUNTER — Other Ambulatory Visit (HOSPITAL_BASED_OUTPATIENT_CLINIC_OR_DEPARTMENT_OTHER): Payer: Self-pay

## 2019-09-05 ENCOUNTER — Other Ambulatory Visit: Payer: Self-pay

## 2019-09-05 ENCOUNTER — Other Ambulatory Visit: Payer: BC Managed Care – PPO | Admitting: *Deleted

## 2019-09-05 DIAGNOSIS — N183 Chronic kidney disease, stage 3 unspecified: Secondary | ICD-10-CM

## 2019-09-05 DIAGNOSIS — N179 Acute kidney failure, unspecified: Secondary | ICD-10-CM

## 2019-09-05 DIAGNOSIS — I4892 Unspecified atrial flutter: Secondary | ICD-10-CM

## 2019-09-05 DIAGNOSIS — I5043 Acute on chronic combined systolic (congestive) and diastolic (congestive) heart failure: Secondary | ICD-10-CM

## 2019-09-05 DIAGNOSIS — E119 Type 2 diabetes mellitus without complications: Secondary | ICD-10-CM

## 2019-09-05 DIAGNOSIS — E1169 Type 2 diabetes mellitus with other specified complication: Secondary | ICD-10-CM

## 2019-09-05 DIAGNOSIS — I10 Essential (primary) hypertension: Secondary | ICD-10-CM

## 2019-09-06 LAB — HEPATIC FUNCTION PANEL
ALT: 30 IU/L (ref 0–44)
AST: 23 IU/L (ref 0–40)
Albumin: 4.5 g/dL (ref 3.8–4.8)
Alkaline Phosphatase: 92 IU/L (ref 39–117)
Bilirubin Total: 0.4 mg/dL (ref 0.0–1.2)
Bilirubin, Direct: 0.14 mg/dL (ref 0.00–0.40)
Total Protein: 7.4 g/dL (ref 6.0–8.5)

## 2019-09-06 LAB — LIPID PANEL
Chol/HDL Ratio: 4 ratio (ref 0.0–5.0)
Cholesterol, Total: 123 mg/dL (ref 100–199)
HDL: 31 mg/dL — ABNORMAL LOW (ref >39)
LDL Chol Calc (NIH): 79 mg/dL (ref 0–99)
Triglycerides: 63 mg/dL (ref 0–149)
VLDL Cholesterol Cal: 13 mg/dL (ref 5–40)

## 2019-09-16 ENCOUNTER — Other Ambulatory Visit: Payer: Self-pay

## 2019-09-16 ENCOUNTER — Other Ambulatory Visit (HOSPITAL_COMMUNITY)
Admission: RE | Admit: 2019-09-16 | Discharge: 2019-09-16 | Disposition: A | Discharge status: Home / Self Care | Payer: BC Managed Care – PPO | Source: Ambulatory Visit | Attending: Cardiology | Admitting: Cardiology

## 2019-09-16 DIAGNOSIS — Z01812 Encounter for preprocedural laboratory examination: Secondary | ICD-10-CM | POA: Diagnosis not present

## 2019-09-16 DIAGNOSIS — Z20822 Contact with and (suspected) exposure to covid-19: Secondary | ICD-10-CM | POA: Diagnosis not present

## 2019-09-17 LAB — SARS CORONAVIRUS 2 (TAT 6-24 HRS): SARS Coronavirus 2: NEGATIVE

## 2019-09-19 ENCOUNTER — Ambulatory Visit: Payer: BC Managed Care – PPO | Attending: Cardiology | Admitting: Cardiology

## 2019-09-19 ENCOUNTER — Other Ambulatory Visit: Payer: Self-pay

## 2019-09-19 DIAGNOSIS — I10 Essential (primary) hypertension: Secondary | ICD-10-CM | POA: Diagnosis not present

## 2019-09-19 DIAGNOSIS — G4733 Obstructive sleep apnea (adult) (pediatric): Secondary | ICD-10-CM

## 2019-09-20 ENCOUNTER — Emergency Department (HOSPITAL_COMMUNITY): Payer: BC Managed Care – PPO

## 2019-09-20 ENCOUNTER — Emergency Department (HOSPITAL_COMMUNITY)
Admission: EM | Admit: 2019-09-20 | Discharge: 2019-09-20 | Disposition: A | Discharge status: Home / Self Care | Payer: BC Managed Care – PPO | Attending: Emergency Medicine | Admitting: Emergency Medicine

## 2019-09-20 ENCOUNTER — Other Ambulatory Visit: Payer: Self-pay

## 2019-09-20 ENCOUNTER — Encounter (HOSPITAL_COMMUNITY): Payer: Self-pay | Admitting: Emergency Medicine

## 2019-09-20 DIAGNOSIS — Z7901 Long term (current) use of anticoagulants: Secondary | ICD-10-CM | POA: Diagnosis not present

## 2019-09-20 DIAGNOSIS — R339 Retention of urine, unspecified: Secondary | ICD-10-CM | POA: Diagnosis not present

## 2019-09-20 DIAGNOSIS — I129 Hypertensive chronic kidney disease with stage 1 through stage 4 chronic kidney disease, or unspecified chronic kidney disease: Secondary | ICD-10-CM | POA: Diagnosis not present

## 2019-09-20 DIAGNOSIS — Z7722 Contact with and (suspected) exposure to environmental tobacco smoke (acute) (chronic): Secondary | ICD-10-CM | POA: Insufficient documentation

## 2019-09-20 DIAGNOSIS — Z79899 Other long term (current) drug therapy: Secondary | ICD-10-CM | POA: Diagnosis not present

## 2019-09-20 DIAGNOSIS — Z7984 Long term (current) use of oral hypoglycemic drugs: Secondary | ICD-10-CM | POA: Insufficient documentation

## 2019-09-20 DIAGNOSIS — N183 Chronic kidney disease, stage 3 unspecified: Secondary | ICD-10-CM | POA: Diagnosis not present

## 2019-09-20 DIAGNOSIS — E1122 Type 2 diabetes mellitus with diabetic chronic kidney disease: Secondary | ICD-10-CM | POA: Insufficient documentation

## 2019-09-20 DIAGNOSIS — R338 Other retention of urine: Secondary | ICD-10-CM

## 2019-09-20 LAB — URINALYSIS, ROUTINE W REFLEX MICROSCOPIC
Bacteria, UA: NONE SEEN
Bilirubin Urine: NEGATIVE
Glucose, UA: NEGATIVE mg/dL
Ketones, ur: NEGATIVE mg/dL
Leukocytes,Ua: NEGATIVE
Nitrite: NEGATIVE
Protein, ur: NEGATIVE mg/dL
Specific Gravity, Urine: 1.006 (ref 1.005–1.030)
pH: 7 (ref 5.0–8.0)

## 2019-09-20 NOTE — ED Triage Notes (Signed)
Pt arrives from sleep study. Pt reports being unable to urinate. Pt states he last urinated around 2130 last night.

## 2019-09-20 NOTE — Discharge Instructions (Addendum)
Please call [facility] to get a follow-up appointment to have your bladder function assessed and to determine when your catheter can be removed.  You can try Dulcolax pills orally to see if that will help you have a bowel movement.  However your x-ray tonight did not show an abnormal amount of stool in your intestines.  Return to the emergency department if you get fever, vomiting, or the catheter stops draining urine.

## 2019-09-20 NOTE — Procedures (Signed)
   Patient Name: Johnny [name], Johnny [name] Date: 09/19/2019 Gender: Male D.O.B: Dec 10, 1955 Age (years): 21 Referring Provider: Daune Perch NP Height (inches): 77 Interpreting Physician: Fransico Him MD, ABSM Weight (lbs): 311 RPSGT: Rosebud Poles BMI: 37 MRN: [id number] Neck Size: 19.00  CLINICAL INFORMATION The patient is referred for a CPAP titration to treat sleep apnea.  SLEEP STUDY TECHNIQUE As per the AASM Manual for the Scoring of Sleep and Associated Events v2.3 (April 2016) with a hypopnea requiring 4% desaturations.  The channels recorded and monitored were frontal, central and occipital EEG, electrooculogram (EOG), submentalis EMG (chin), nasal and oral airflow, thoracic and abdominal wall motion, anterior tibialis EMG, snore microphone, electrocardiogram, and pulse oximetry. Continuous positive airway pressure (CPAP) was initiated at the beginning of the study and titrated to treat sleep-disordered breathing.  MEDICATIONS Medications self-administered by patient taken the night of the study : N/A  TECHNICIAN COMMENTS Comments added by technician: Patient had more than two awakenings to use the bathroom. Patient requested to end study early due to discomfort with equipment. Patient had trouble with voiding and bowel movement during testing. Patient requested to end study and reschedule for another night Comments added by scorer: N/A  RESPIRATORY PARAMETERS Optimal PAP Pressure (cm): N/A  AHI at Optimal Pressure (/hr):N/A Overall Minimal O2 (%):87.0  Supine % at Optimal Pressure (%):N/A Minimal O2 at Optimal Pressure (%): 87.0   SLEEP ARCHITECTURE The study was initiated at 10:06:12 PM and ended at 3:18:58 AM.  Sleep onset time was 4.7 minutes and the sleep efficiency was 26.7%. The total sleep time was 83.5 minutes.  The patient spent 10.2% of the night in stage N1 sleep, 71.9% in stage N2 sleep, 18.0% in stage N3 and 0% in REM.Stage REM latency was N/A minutes   Wake after sleep onset was 224.5. Alpha intrusion was absent. Supine sleep was 70.06%.  CARDIAC DATA The 2 lead EKG demonstrated sinus rhythm. The mean heart rate was 65.9 beats per minute. Other EKG findings include: None.  LEG MOVEMENT DATA The total Periodic Limb Movements of Sleep (PLMS) were 0. The PLMS index was 0.0. A PLMS index of <15 is considered normal in adults.  IMPRESSIONS - An optimal PAP pressure could not be selected for this patient based on the available study data. - Severe Central Sleep Apnea was noted during this titration (CAI = 43.8/h). - Mild oxygen desaturations were observed during this titration (min O2 = 87.0%). - The patient snored with moderate snoring volume during this titration study. - No cardiac abnormalities were observed during this study. - Clinically significant periodic limb movements were not noted during this study. Arousals associated with PLMs were rare.  DIAGNOSIS - Obstructive Sleep Apnea (327.23 [G47.33 ICD-10])  RECOMMENDATIONS - Unsuccessful CPAP titration due to ongoing respiratory events, therefore recommend in lab BiPAP titration.  - Avoid alcohol, sedatives and other CNS depressants that may worsen sleep apnea and disrupt normal sleep architecture. - Sleep hygiene should be reviewed to assess factors that may improve sleep quality. - Weight management and regular exercise should be initiated or continued.  [Electronically signed] 09/20/2019 01:27 PM  Fransico Him MD, ABSM Diplomate, American Board of Sleep Medicine

## 2019-09-20 NOTE — ED Provider Notes (Signed)
[facility] EMERGENCY DEPARTMENT Provider Note   CSN: [id number] Arrival date & time: 09/20/19  6440   Time seen 3:15 AM  History Chief Complaint  Patient presents with  . Urinary Retention    Johnny [name] is a 64 y.o. male.  HPI   Patient states that he was here for a sleep study tonight and he urinated about 9:30 PM and it was totally normal.  He states about an hour and a half later after sleeping he woke up and felt like he needed to urinate again and only urinated a small amount and then within 10 minutes felt the need to urinate again and it was only a small amount.  He states his abdomen feels full and he has lower abdominal pain and points to his suprapubic area.  He states he has some mild bloating.  He denies any nausea or vomiting.  He states he feels like he is constipated.  He states he did have a bowel movement yesterday but it was small.  He states that he had trouble urinating once before however after having a bowel movement he could urinate normally afterwards.  He has never had to see a urologist before.  PCP [name], MD   Past Medical History:  Diagnosis Date  . Atrial fibrillation ([place])   . CKD (chronic kidney disease) stage 3, GFR 30-59 ml/min 06/10/2017  . Diabetes mellitus   . Dyslipidemia 08/20/2018  . Essential hypertension 08/03/2007   Qualifier: Diagnosis of  By: Linda Hedges MD, Heinz Knuckles  Med ARB, diuretic    . Guttate psoriasis   . HTN (hypertension)   . Hyperlipidemia   . Hyperlipidemia associated with type 2 diabetes mellitus ([place]) 08/03/2007   Qualifier: Diagnosis of  By: Linda Hedges MD, Heinz Knuckles  meds - none   . 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 Type II diabetes mellitus with renal manifestations, uncontrolled ([place]) 08/03/2007   Qualifier: Diagnosis of  By: Linda Hedges MD, Heinz Knuckles  Med metformin     Patient Active Problem List   Diagnosis Date Noted  . Atrial flutter with rapid ventricular  response ([place]) 07/15/2019  . Acute on chronic combined systolic and diastolic CHF (congestive heart failure) ([place])   . Medically noncompliant   . 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 1 through stage 4 chronic kidney disease, or chronic kidney disease ([place])   . Atrial flutter ([place]) 07/14/2019  . Type 2 diabetes mellitus with hyperglycemia, without long-term current use of insulin ([place]) 03/28/2019  . Type 2 diabetes mellitus with diabetic polyneuropathy, without long-term current use of insulin ([place]) 03/28/2019  . Type 2 diabetes mellitus with stage 3 chronic kidney disease, without long-term current use of insulin ([place]) 01/03/2019  . Dyslipidemia 08/20/2018  . CKD (chronic kidney disease) stage 3, GFR 30-59 ml/min 06/10/2017  . Routine health maintenance 11/06/2011  . 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 Type II diabetes mellitus with renal manifestations, uncontrolled ([place]) 08/03/2007  . Hyperlipidemia associated with type 2 diabetes mellitus ([place]) 08/03/2007  . Essential hypertension 08/03/2007    Past Surgical History:  Procedure Laterality Date  . CARDIOVERSION N/A 07/18/2019   Procedure: CARDIOVERSION;  Surgeon: Dorothy Spark, MD;  Location: [facility] ENDOSCOPY;  Service: Cardiovascular;  Laterality: N/A;  . TEE WITHOUT CARDIOVERSION N/A 07/18/2019   Procedure: TRANSESOPHAGEAL ECHOCARDIOGRAM (TEE);  Surgeon: Dorothy Spark, MD;  Location: [place];  Service: Cardiovascular;  Laterality: N/A;  . TOOTH EXTRACTION         Family History  Problem Relation Age of Onset  . Hypertension Father        DOB [birth year]  . Hyperlipidemia Father   . Diabetes Father   . Coronary artery disease Father        with stents  . Nephrolithiasis Father   . Kidney disease Father        s/p rejected kidney transplant; on HD  . Memory loss Mother        DOB 51  . Kidney disease Mother        s/p nephrectomy, infection problem  . Dementia Mother   . Cancer  Brother        lung  . HIV Brother   . Prostate cancer Neg Hx   . Colon cancer Neg Hx     Social History   Tobacco Use  . Smoking status: Passive Smoke Exposure - Never Smoker  . Smokeless tobacco: Never Used  Substance Use Topics  . Alcohol use: No  . Drug use: No    Home Medications Prior to Admission medications   Medication Sig Start Date End Date Taking? Authorizing Provider  apixaban (ELIQUIS) 5 MG TABS tablet Take 1 tablet (5 mg total) by mouth 2 (two) times daily. 07/25/19   Daune Perch, NP  atorvastatin (LIPITOR) 80 MG tablet Take 1 tablet (80 mg total) by mouth daily. 07/20/19 07/19/20  Daune Perch, NP  carvedilol (COREG) 25 MG tablet Take 1 tablet (25 mg total) by mouth 2 (two) times daily with a meal. 07/19/19 07/18/20  Daune Perch, NP  furosemide (LASIX) 20 MG tablet Take 1 tablet (20 mg total) by mouth daily. 07/20/19 10/18/19  Daune Perch, NP  glipiZIDE (GLUCOTROL) 10 MG tablet TAKE 1 TABLET (10 MG TOTAL) BY MOUTH 2 (TWO) TIMES DAILY WITH A MEAL. 08/12/19   Shamleffer, Melanie Crazier, MD  isosorbide-hydrALAZINE (BIDIL) 20-37.5 MG tablet Take 1 tablet by mouth 3 (three) times daily. 07/19/19 07/18/20  Daune Perch, NP  metFORMIN (GLUCOPHAGE-XR) 500 MG 24 hr tablet TAKE 1 TABLET BY MOUTH TWICE A DAY 03/28/19   Shamleffer, Melanie Crazier, MD    Allergies    Patient has no known allergies.  Review of Systems   Review of Systems  All other systems reviewed and are negative.   Physical Exam Updated Vital Signs BP (!) 221/117 (BP Location: Left Arm)   Pulse 75   Temp 98.7 F (37.1 C) (Oral)   Resp 18   Wt (!) 141.2 kg   SpO2 97%   BMI 36.91 kg/m   Physical Exam Vitals and nursing note reviewed.  Constitutional:      Appearance: Normal appearance. He is obese.  HENT:     Head: Normocephalic and atraumatic.  Eyes:     Extraocular Movements: Extraocular movements intact.     Conjunctiva/sclera: Conjunctivae normal.  Cardiovascular:     Rate  and Rhythm: Normal rate.  Pulmonary:     Effort: Pulmonary effort is normal. No respiratory distress.  Abdominal:     General: There is distension.     Tenderness: There is abdominal tenderness in the suprapubic area.  Musculoskeletal:        General: Normal range of motion.     Cervical back: Normal range of motion.  Skin:    General: Skin is warm and dry.     Findings: Rash present.     Comments: Patient has a lot of  dry skin and areas of skin lesions consistent with psoriasis on his trunk and extremities.  Neurological:     General: No focal deficit present.     Mental Status: He is alert and oriented to person, place, and time.     Cranial Nerves: No cranial nerve deficit.  Psychiatric:        Mood and Affect: Mood normal.        Behavior: Behavior normal.        Thought Content: Thought content normal.     ED Results / Procedures / Treatments   Labs (all labs ordered are listed, but only abnormal results are displayed) Labs Reviewed  URINALYSIS, ROUTINE W REFLEX MICROSCOPIC - Abnormal; Notable for the following components:      Result Value   Color, Urine STRAW (*)    Hgb urine dipstick MODERATE (*)    All other components within normal limits    EKG None  Radiology DG Abdomen 1 View  Result Date: 09/20/2019 CLINICAL DATA:  Constipation and trouble breathing EXAM: ABDOMEN - 1 VIEW COMPARISON:  None. FINDINGS: The bowel gas pattern is normal. No abnormal stool retention. Pelvic calcifications on the right have the appearance of phlebolith. No abnormal osseous findings. IMPRESSION: Normal bowel gas pattern.  No abnormal stool retention. Electronically Signed   By: Monte Fantasia M.D.   On: 09/20/2019 05:09    Procedures Procedures (including critical care time)  Medications Ordered in ED Medications - No data to display  ED Course  I have reviewed the triage vital signs and the nursing notes.  Pertinent labs & imaging results that were available during my care  of the patient were reviewed by me and considered in my medical decision making (see chart for details).    MDM Rules/Calculators/A&P                      Patient's bladder scan showed 878 cc of urine.  Foley catheter was inserted.  Abdominal x-ray was done afterwards to see the state of his constipation.   Recheck at 5:15 AM patient states he is feeling much better.  He does have a small amount of gross blood in his catheter tube.  He has had about 2000 cc of output.  We discussed going home with the catheter and following up with urology.  His urine had some blood which is consistent with having the catheter placed but no obvious signs of infection.  Patient's x-ray does not show any significant increase of stool in the intestines.  He can take oral Dulcolax because he feels like he may be constipated.   Final Clinical Impression(s) / ED Diagnoses Final diagnoses:  Acute urinary retention    Rx / DC Orders ED Discharge Orders    None     Plan discharge  [place] Porter, MD, Barbette Or, MD 09/20/19 [phone]

## 2019-09-23 ENCOUNTER — Other Ambulatory Visit: Payer: Self-pay

## 2019-09-23 ENCOUNTER — Encounter: Payer: Self-pay | Admitting: Urology

## 2019-09-23 ENCOUNTER — Other Ambulatory Visit: Payer: Self-pay | Admitting: Internal Medicine

## 2019-09-23 ENCOUNTER — Ambulatory Visit: Payer: BC Managed Care – PPO | Admitting: Urology

## 2019-09-23 VITALS — BP 150/86 | HR 74 | Temp 97.0°F | Ht 77.0 in | Wt 310.0 lb

## 2019-09-23 DIAGNOSIS — R339 Retention of urine, unspecified: Secondary | ICD-10-CM | POA: Insufficient documentation

## 2019-09-23 DIAGNOSIS — N138 Other obstructive and reflux uropathy: Secondary | ICD-10-CM | POA: Diagnosis not present

## 2019-09-23 DIAGNOSIS — N401 Enlarged prostate with lower urinary tract symptoms: Secondary | ICD-10-CM

## 2019-09-23 MED ORDER — ALFUZOSIN HCL ER 10 MG PO TB24: 10.0000 mg | ORAL_TABLET | Freq: Every day | ORAL | 11 refills | Status: DC

## 2019-09-23 NOTE — Patient Instructions (Signed)

## 2019-09-23 NOTE — Progress Notes (Signed)
09/23/2019 10:09 AM   Johnny [name] 28-Mar-1956 [id number]  Referring provider: [name], MD [address] [place],  [place] [address]  Urinary retention  HPI: Johnny [name] is a 64yo here for evaluation of urinary retention. He was having a sleep Monday night and he developed an inability to urinate. He had a foley placed and 1300cc drained. Prior to his sleep study he had nocturia 3-5x, weak stream, urinary frequency every 3 hours. No hematuria until the catheter was placed. He strains to urinate, he has intermittency with his stream and hesitancy.  No prior BPH meds   PMH: Past Medical History:  Diagnosis Date  . Atrial fibrillation ([place])   . CKD (chronic kidney disease) stage 3, GFR 30-59 ml/min 06/10/2017  . Diabetes mellitus   . Dyslipidemia 08/20/2018  . Essential hypertension 08/03/2007   Qualifier: Diagnosis of  By: Linda Hedges MD, Heinz Knuckles  Med ARB, diuretic    . Guttate psoriasis   . HTN (hypertension)   . Hyperlipidemia   . Hyperlipidemia associated with type 2 diabetes mellitus ([place]) 08/03/2007   Qualifier: Diagnosis of  By: Linda Hedges MD, Heinz Knuckles  meds - none   . 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 Sleep apnea   . Type II diabetes mellitus with renal manifestations, uncontrolled ([place]) 08/03/2007   Qualifier: Diagnosis of  By: Linda Hedges MD, Heinz Knuckles  Med metformin     Surgical History: Past Surgical History:  Procedure Laterality Date  . CARDIOVERSION N/A 07/18/2019   Procedure: CARDIOVERSION;  Surgeon: Dorothy Spark, MD;  Location: [facility] ENDOSCOPY;  Service: Cardiovascular;  Laterality: N/A;  . TEE WITHOUT CARDIOVERSION N/A 07/18/2019   Procedure: TRANSESOPHAGEAL ECHOCARDIOGRAM (TEE);  Surgeon: Dorothy Spark, MD;  Location: [facility] ENDOSCOPY;  Service: Cardiovascular;  Laterality: N/A;  . TOOTH EXTRACTION      Home Medications:  Allergies as of 09/23/2019   No Known Allergies     Medication List         Accurate as of Sep 23, 2019 10:09 AM. If you have any questions, ask your nurse or doctor.        apixaban 5 MG Tabs tablet Commonly known as: ELIQUIS Take 1 tablet (5 mg total) by mouth 2 (two) times daily.   atorvastatin 80 MG tablet Commonly known as: LIPITOR Take 1 tablet (80 mg total) by mouth daily.   carvedilol 25 MG tablet Commonly known as: COREG Take 1 tablet (25 mg total) by mouth 2 (two) times daily with a meal.   furosemide 20 MG tablet Commonly known as: LASIX Take 1 tablet (20 mg total) by mouth daily.   glipiZIDE 10 MG tablet Commonly known as: GLUCOTROL TAKE 1 TABLET (10 MG TOTAL) BY MOUTH 2 (TWO) TIMES DAILY WITH A MEAL.   isosorbide-hydrALAZINE 20-37.5 MG tablet Commonly known as: BIDIL Take 1 tablet by mouth 3 (three) times daily.   metFORMIN 500 MG 24 hr tablet Commonly known as: GLUCOPHAGE-XR TAKE 1 TABLET BY MOUTH TWICE A DAY       Allergies: No Known Allergies  Family History: Family History  Problem Relation Age of Onset  . Hypertension Father        DOB [birth year]  . Hyperlipidemia Father   . Diabetes Father   . Coronary artery disease Father        with stents  . Nephrolithiasis Father   . Kidney disease Father  s/p rejected kidney transplant; on HD  . Memory loss Mother        DOB 75  . Kidney disease Mother        s/p nephrectomy, infection problem  . Dementia Mother   . Cancer Brother        lung  . HIV Brother   . Prostate cancer Neg Hx   . Colon cancer Neg Hx     Social History:  reports that he is a non-smoker but has been exposed to tobacco smoke. He has never used smokeless tobacco. He reports that he does not drink alcohol or use drugs.  ROS: All other review of systems were reviewed and are negative except what is noted above in HPI  Physical Exam: BP (!) 150/86   Pulse 74   Temp (!) 97 F (36.1 C)   Ht 6\' 5"  (1.956 m)   Wt (!) 310 lb (140.6 kg)   BMI 36.76 kg/m   Constitutional:  Alert and oriented,  No acute distress. HEENT: [place] AT, moist mucus membranes.  Trachea midline, no masses. Cardiovascular: No clubbing, cyanosis, or edema. Respiratory: Normal respiratory effort, no increased work of breathing. GI: Abdomen is soft, nontender, nondistended, no abdominal masses GU: No CVA tenderness. Circumcised phallus. No masses/lesions on penis, testis, scrotum. Prostate 40g smooth no nodules no induration.  Lymph: No cervical or inguinal lymphadenopathy. Skin: No rashes, bruises or suspicious lesions. Neurologic: Grossly intact, no focal deficits, moving all 4 extremities. Psychiatric: Normal mood and affect.  Laboratory Data: Lab Results  Component Value Date   WBC 5.1 07/18/2019   HGB 13.7 07/18/2019   HCT 43.2 07/18/2019   MCV 90.4 07/18/2019   PLT 281 07/18/2019    Lab Results  Component Value Date   CREATININE 1.94 (H) 07/25/2019    Lab Results  Component Value Date   PSA 11.14 (H) 11/19/2012   PSA 7.47 (H) 11/04/2011   PSA 3.43 04/23/2009    No results found for: TESTOSTERONE  Lab Results  Component Value Date   HGBA1C 8.4 (H) 07/14/2019    Urinalysis    Component Value Date/Time   COLORURINE STRAW (A) 09/20/2019 0421   APPEARANCEUR CLEAR 09/20/2019 0421   LABSPEC 1.006 09/20/2019 0421   PHURINE 7.0 09/20/2019 0421   GLUCOSEU NEGATIVE 09/20/2019 0421   GLUCOSEU >=1000 04/23/2009 0911   HGBUR MODERATE (A) 09/20/2019 0421   BILIRUBINUR NEGATIVE 09/20/2019 0421   KETONESUR NEGATIVE 09/20/2019 0421   PROTEINUR NEGATIVE 09/20/2019 0421   UROBILINOGEN 0.2 04/23/2009 0911   NITRITE NEGATIVE 09/20/2019 0421   LEUKOCYTESUR NEGATIVE 09/20/2019 0421    Lab Results  Component Value Date   BACTERIA NONE SEEN 09/20/2019    Pertinent Imaging:  Results for orders placed during the hospital encounter of 09/20/19  DG Abdomen 1 View   Narrative CLINICAL DATA:  Constipation and trouble breathing  EXAM: ABDOMEN - 1 VIEW  COMPARISON:  None.  FINDINGS: The bowel  gas pattern is normal. No abnormal stool retention. Pelvic calcifications on the right have the appearance of phlebolith. No abnormal osseous findings.  IMPRESSION: Normal bowel gas pattern.  No abnormal stool retention.   Electronically Signed   By: Monte Fantasia M.D.   On: 09/20/2019 05:09    No results found for this or any previous visit. No results found for this or any previous visit. No results found for this or any previous visit. No results found for this or any previous visit. No results found for this or  any previous visit. No results found for this or any previous visit. No results found for this or any previous visit.  Assessment & Plan:    1. Urinary retention -We will start uroxatral 10mg  qhs  2. Benign prostatic hyperplasia with urinary obstruction -Start uroxatral 10mg  qhs -RTC 1 week for voiding    No follow-ups on file.  Nicolette Bang, MD  [facility] [place]

## 2019-09-23 NOTE — Progress Notes (Signed)
Urological Symptom Review  Patient is experiencing the following symptoms: Frequent urination Hard to postpone urination Get up at night to urinate Leakage of urine Trouble starting stream Weak stream Erection problems (male only)   Review of Systems  Gastrointestinal (upper)  : Negative for upper GI symptoms  Gastrointestinal (lower) : Negative for lower GI symptoms  Constitutional : Negative for symptoms  Skin: Negative for skin symptoms  Eyes: Negative for eye symptoms  Ear/Nose/Throat : Negative for Ear/Nose/Throat symptoms  Hematologic/Lymphatic: Negative for Hematologic/Lymphatic symptoms  Cardiovascular : Negative for cardiovascular symptoms  Respiratory : Negative for respiratory symptoms  Endocrine: Negative for endocrine symptoms  Musculoskeletal: Negative for musculoskeletal symptoms  Neurological: Negative for neurological symptoms  Psychologic: Negative for psychiatric symptoms

## 2019-09-30 ENCOUNTER — Other Ambulatory Visit: Payer: Self-pay

## 2019-09-30 ENCOUNTER — Ambulatory Visit (INDEPENDENT_AMBULATORY_CARE_PROVIDER_SITE_OTHER): Payer: BC Managed Care – PPO

## 2019-09-30 DIAGNOSIS — R339 Retention of urine, unspecified: Secondary | ICD-10-CM

## 2019-09-30 NOTE — Progress Notes (Signed)
Fill and Pull Catheter Removal  Patient is present today for a catheter removal.  Patient was cleaned and prepped in a sterile fashion 175 ml of sterile water/ saline was instilled into the bladder when the patient felt the urge to urinate. 45ml of water was then drained from the balloon.  A 16FR foley cath was removed from the bladder no complications were noted .  Patient as then given some time to void on their own.  Patient can void  150 ml on their own after some time.  Patient tolerated well.  Performed by: Arvle Grabe LPN  Follow up/ Additional notes: 1 wk nv pvr

## 2019-10-04 ENCOUNTER — Other Ambulatory Visit: Payer: Self-pay

## 2019-10-04 MED ORDER — FUROSEMIDE 20 MG PO TABS: 20.0000 mg | ORAL_TABLET | Freq: Every day | ORAL | 2 refills | Status: DC

## 2019-10-07 ENCOUNTER — Other Ambulatory Visit: Payer: Self-pay

## 2019-10-07 ENCOUNTER — Ambulatory Visit (INDEPENDENT_AMBULATORY_CARE_PROVIDER_SITE_OTHER): Payer: BC Managed Care – PPO

## 2019-10-07 ENCOUNTER — Ambulatory Visit: Payer: BC Managed Care – PPO | Admitting: Internal Medicine

## 2019-10-07 ENCOUNTER — Telehealth: Payer: Self-pay | Admitting: *Deleted

## 2019-10-07 DIAGNOSIS — R339 Retention of urine, unspecified: Secondary | ICD-10-CM | POA: Diagnosis not present

## 2019-10-07 LAB — BLADDER SCAN AMB NON-IMAGING: Scan Result: 23.9

## 2019-10-07 NOTE — Telephone Encounter (Signed)
-----   Message from Sueanne Margarita, MD sent at 09/20/2019  1:30 PM EDT ----- Unsuccessful CPAP titration due to severity of OSA.  Please set up BiPAP titration in lab

## 2019-10-07 NOTE — Telephone Encounter (Addendum)
Informed patient of sleep study results and patient understanding was verbalized. Patient understands his sleep study showed Unsuccessful CPAP titration due to severity of OSA. Please set up BiPAP titration in lab.  Bipap titration sent to sleep pool

## 2019-10-21 ENCOUNTER — Telehealth: Payer: Self-pay | Admitting: *Deleted

## 2019-10-21 DIAGNOSIS — G4733 Obstructive sleep apnea (adult) (pediatric): Secondary | ICD-10-CM

## 2019-10-21 NOTE — Telephone Encounter (Signed)
-----   Message from Freada Bergeron, [place] sent at 10/07/2019  1:41 PM EDT ----- Regarding: precert  Please set up BiPAP titration in lab

## 2019-10-31 ENCOUNTER — Ambulatory Visit: Payer: BC Managed Care – PPO | Admitting: Internal Medicine

## 2019-10-31 ENCOUNTER — Encounter: Payer: Self-pay | Admitting: Internal Medicine

## 2019-10-31 ENCOUNTER — Other Ambulatory Visit: Payer: Self-pay

## 2019-10-31 VITALS — BP 158/88 | HR 78 | Ht 77.0 in | Wt 317.0 lb

## 2019-10-31 DIAGNOSIS — E1122 Type 2 diabetes mellitus with diabetic chronic kidney disease: Secondary | ICD-10-CM

## 2019-10-31 DIAGNOSIS — E1121 Type 2 diabetes mellitus with diabetic nephropathy: Secondary | ICD-10-CM

## 2019-10-31 DIAGNOSIS — N1831 Chronic kidney disease, stage 3a: Secondary | ICD-10-CM | POA: Diagnosis not present

## 2019-10-31 DIAGNOSIS — E1129 Type 2 diabetes mellitus with other diabetic kidney complication: Secondary | ICD-10-CM | POA: Diagnosis not present

## 2019-10-31 DIAGNOSIS — IMO0002 Reserved for concepts with insufficient information to code with codable children: Secondary | ICD-10-CM

## 2019-10-31 DIAGNOSIS — E1165 Type 2 diabetes mellitus with hyperglycemia: Secondary | ICD-10-CM | POA: Diagnosis not present

## 2019-10-31 LAB — POCT GLYCOSYLATED HEMOGLOBIN (HGB A1C): Hemoglobin A1C: 7.9 % — AB (ref 4.0–5.6)

## 2019-10-31 MED ORDER — TRULICITY 0.75 MG/0.5ML ~~LOC~~ SOAJ
0.7500 mg | SUBCUTANEOUS | 6 refills | Status: DC
Start: 2019-10-31 — End: 2019-12-23

## 2019-10-31 NOTE — Patient Instructions (Addendum)
-   Continue Metformin 500 mg 1 tablet with Breakfast and 1 tablet with supper - Continue glipizide 5 mg, 1 tablet before breakfast and Supper - Start Trulicity 3.25 mg once a week    - If your sugars are consistently less then 90 , start taking Glipizide at HALF a tablet twice a day          - HOW TO TREAT LOW BLOOD SUGARS (Blood sugar LESS THAN 70 MG/DL)  Please follow the RULE OF 15 for the treatment of hypoglycemia treatment (when your (blood sugars are less than 70 mg/dL)    STEP 1: Take 15 grams of carbohydrates when your blood sugar is low, which includes:   3-4 GLUCOSE TABS  OR  3-4 OZ OF JUICE OR REGULAR SODA OR  ONE TUBE OF GLUCOSE GEL     STEP 2: RECHECK blood sugar in 15 MINUTES STEP 3: If your blood sugar is still low at the 15 minute recheck --> then, go back to STEP 1 and treat AGAIN with another 15 grams of carbohydrates.

## 2019-10-31 NOTE — Progress Notes (Signed)
Name: Johnny [name]  Age/ Sex: 64 y.o., male   MRN/ DOB: [id number], 1955/07/10     PCP: [name], MD   Reason for Endocrinology Evaluation: Type 2 Diabetes Mellitus  Initial Endocrine Consultative Visit: 08/20/2018    PATIENT IDENTIFIER: Johnny [name] is a 64 y.o. male with a past medical history of T2DM, psoriasis, HTN and OSA (Dx 08/2019) . The patient has followed with Endocrinology clinic since 08/20/2018 for consultative assistance with management of his diabetes.  DIABETIC HISTORY:  Johnny [name] was diagnosed with T2DM in 2012,. He was on Metformin,Farxiga and Glimepiride in the past. He has never been on insulin. His hemoglobin A1c has ranged from 9.0 % in 2013, peaking at 13.5% in 06/2018.  On his initial visit to our clinic his A1c was 13.5%. He was not taking any of his meds. He was restarted on Metformin and glipizide.   Hyperaldosteronism was ruled out in 09/2018 for extreme HTN  SUBJECTIVE:   During the last visit (12/31/2018): A1c 8.7 %. Pt declined add -on therapy, we continued metformin and Glipizide.   Today (10/31/2019): Johnny [name] is here for a 4 month  follow up on his diabetes.  He checks his blood sugars 2 times daily, preprandial to breakfast and supper. The patient has not had hypoglycemic episodes since the last clinic visit. Otherwise, the patient has not required any recent emergency interventions for hypoglycemia and has not had recent hospitalizations secondary to hyper or hypoglycemic episodes.   Since his last visit here , he has been to cardiology for A.Flutter. He was also diagnosed with severe OSA , CPAP was recommended  Currently on Prednisone , last day tomorrow   SOB  And cough are improving.  Denies chest pain or palpitations    HOME DIABETES REGIMEN:   Metformin 500 mg XR 1 tablets BID with meals  Glipizide 10 mg,  1 tablets BID with meals   METER DOWNLOAD SUMMARY: 6/15-6/28/2021 Fingerstick Blood Glucose Tests =  8 Average Number Tests/Day = 0.6 Overall Mean FS Glucose = 128   BG Ranges: Low =82 High = 227   Hypoglycemic Events/30 Days: BG < 50 = 0 Episodes of symptomatic severe hypoglycemia = 0     HISTORY:  Past Medical History:  Past Medical History:  Diagnosis Date  . Atrial fibrillation ([place])   . CKD (chronic kidney disease) stage 3, GFR 30-59 ml/min 06/10/2017  . Diabetes mellitus   . Dyslipidemia 08/20/2018  . Essential hypertension 08/03/2007   Qualifier: Diagnosis of  By: Linda Hedges MD, Heinz Knuckles  Med ARB, diuretic    . Guttate psoriasis   . HTN (hypertension)   . Hyperlipidemia   . Hyperlipidemia associated with type 2 diabetes mellitus ([place]) 08/03/2007   Qualifier: Diagnosis of  By: Linda Hedges MD, Heinz Knuckles  meds - none   . 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 Sleep apnea   . Type II diabetes mellitus with renal manifestations, uncontrolled ([place]) 08/03/2007   Qualifier: Diagnosis of  By: Linda Hedges MD, Heinz Knuckles  Med metformin    Past Surgical History:  Past Surgical History:  Procedure Laterality Date  . CARDIOVERSION N/A 07/18/2019   Procedure: CARDIOVERSION;  Surgeon: Dorothy Spark, MD;  Location: [facility] ENDOSCOPY;  Service: Cardiovascular;  Laterality: N/A;  . TEE WITHOUT CARDIOVERSION N/A 07/18/2019   Procedure: TRANSESOPHAGEAL ECHOCARDIOGRAM (TEE);  Surgeon: Dorothy Spark, MD;  Location: [place];  Service:  Cardiovascular;  Laterality: N/A;  . TOOTH EXTRACTION      Social History:  reports that he is a non-smoker but has been exposed to tobacco smoke. He has never used smokeless tobacco. He reports that he does not drink alcohol and does not use drugs. Family History:  Family History  Problem Relation Age of Onset  . Hypertension Father        DOB [birth year]  . Hyperlipidemia Father   . Diabetes Father   . Coronary artery disease Father        with stents  . Nephrolithiasis Father   . Kidney disease Father         s/p rejected kidney transplant; on HD  . Memory loss Mother        DOB 32  . Kidney disease Mother        s/p nephrectomy, infection problem  . Dementia Mother   . Cancer Brother        lung  . HIV Brother   . Prostate cancer Neg Hx   . Colon cancer Neg Hx      HOME MEDICATIONS: Allergies as of 10/31/2019   No Known Allergies     Medication List       Accurate as of October 31, 2019  3:42 PM. If you have any questions, ask your nurse or doctor.        alfuzosin 10 MG 24 hr tablet Commonly known as: UROXATRAL Take 1 tablet (10 mg total) by mouth daily with breakfast.   apixaban 5 MG Tabs tablet Commonly known as: ELIQUIS Take 1 tablet (5 mg total) by mouth 2 (two) times daily.   atorvastatin 80 MG tablet Commonly known as: LIPITOR Take 1 tablet (80 mg total) by mouth daily.   azithromycin 250 MG tablet Commonly known as: ZITHROMAX azithromycin 250 mg tablet  TAKE 2 TABLETS (500 MG) BY ORAL ROUTE ONCE DAILY FOR 1 DAY THEN 1 TABLET (250 MG) BY ORAL ROUTE ONCE DAILY FOR 4 DAYS   carvedilol 25 MG tablet Commonly known as: COREG Take 1 tablet (25 mg total) by mouth 2 (two) times daily with a meal.   furosemide 20 MG tablet Commonly known as: LASIX Take 1 tablet (20 mg total) by mouth daily.   glipiZIDE 10 MG tablet Commonly known as: GLUCOTROL TAKE 1 TABLET (10 MG TOTAL) BY MOUTH 2 (TWO) TIMES DAILY WITH A MEAL.   Hydromet 5-1.5 MG/5ML syrup Generic drug: HYDROcodone-homatropine Hydromet 5 mg-1.5 mg/5 mL oral syrup  Take 5 mL every 4 hours by oral route.   isosorbide-hydrALAZINE 20-37.5 MG tablet Commonly known as: BIDIL Take 1 tablet by mouth 3 (three) times daily.   metFORMIN 500 MG 24 hr tablet Commonly known as: GLUCOPHAGE-XR TAKE 1 TABLET BY MOUTH TWICE A DAY   predniSONE 20 MG tablet Commonly known as: DELTASONE Take 20 mg by mouth 2 (two) times daily.   Trulicity 6.31 SH/7.62YO Sopn Generic drug: Dulaglutide Inject 0.5 mLs (0.75 mg total) into  the skin once a week. Started by: Dorita Sciara, MD        OBJECTIVE:   Vital Signs: BP (!) 158/88 (BP Location: Left Arm, Patient Position: Sitting, Cuff Size: Large)   Pulse 78   Ht 6\' 5"  (1.956 m)   Wt (!) 317 lb (143.8 kg)   SpO2 98%   BMI 37.59 kg/m   Wt Readings from Last 3 Encounters:  10/31/19 (!) 317 lb (143.8 kg)  09/23/19 (!) 310 lb (140.6 kg)  09/20/19 (!) 311 lb 4.6 oz (141.2 kg)     Exam: General: Pt appears well and is in NAD  Neck: General: Supple without adenopathy. Thyroid: Thyroid size normal.  No goiter or nodules appreciated. No thyroid bruit.  Lungs: Clear with good BS bilat with no rales, rhonchi, or wheezes  Heart: RRR with normal S1 and S2 and no gallops; no murmurs; no rub  Extremities: 1+ pretibial edema.  Skin: Normal texture and temperature to palpation. No rash noted.  Neuro: MS is good with appropriate affect, pt is alert and Ox3    DM foot exam: 03/25/2019 The skin of the feet is without sores or ulcerations. But has extensive plantar callous formation bilaterally. The pedal pulses are 2+ on right and 2+ on left. The sensation is decreased to a screening 5.07, 10 gram monofilament bilaterally           DATA REVIEWED:  Lab Results  Component Value Date   HGBA1C 7.9 (A) 10/31/2019   HGBA1C 8.4 (H) 07/14/2019   HGBA1C 8.7 (A) 07/01/2019   Lab Results  Component Value Date   MICROALBUR 14.8 (H) 06/11/2018   LDLCALC 79 09/05/2019   CREATININE 1.94 (H) 07/25/2019    ASSESSMENT / PLAN / RECOMMENDATIONS:   1) Type 2 Diabetes Mellitus, Optimally controlled, With CKD III and neuropathic  complications - Most recent A1c of 7.9%. Goal A1c < 7.0 %.  Down from 10.0%   - Slight improvement in hyperglycemia.  - I again have advised him to consider add-on therapy as his A1c has been above goal for the past year, and lifestyle changes are not consistent.  - Unable to increase metformin due to low GFR, not a candidate for SGLT-2  inhibitors for the same reason either   - Discussed benefits and risks of GLP-1 agonists and the pt agreed to try a small dose at this time, he was cautioned against GI side effects   - Recent hyperglycemia due steroid intake, has 2 more days of it, but I explained to him his A1c will not be impacted by this yet   MEDICATIONS:  Continue Metformin 500 mg BID   Continue  Glipizide 10 mg, 1 Tablet before Breakfast and 1 tablet before supper   Start Trulicity 9.47 mg weekly   EDUCATION / INSTRUCTIONS:  BG monitoring instructions: Patient is instructed to check his blood sugars 2 times a day, fasting and bedtime.  Call [facility] if: BG persistently < 70 or > 300. . I reviewed the Rule of 15 for the treatment of hypoglycemia in detail with the patient. Literature supplied.    F/U in 3 months    Signed electronically by: Mack Guise, MD  [facility]  [facility] [address] [place], [place] [address] Phone: [phone] FAX: [phone]   CC: [name], [place] [address] Phone: [phone]  Fax: [phone]  Return to Endocrinology clinic as below: Future Appointments  Date Time Provider [place]  11/04/2019  2:00 PM Irine Seal, MD [facility] None

## 2019-11-01 NOTE — Progress Notes (Signed)
11/04/2019 2:51 PM   Johnny [name] April 03, 1956 [id number]  Referring provider: [name], MD [address] [place],  [place] [address]  Urinary retention  HPI:  11/04/19: Johnny [name] returns today in f/u.  He has a history of BPH with retention and is doing well on alfuzosin since his voiding trial.  His PVR is 29ml.  He has some increased nocturia but he was on a new med for bronchitis and thinks that is the cause.   He took the last dose yesterday.   He has no hematuria or dysuria.  UA has only trace blood but he has glucosuria and reports worse control on the steroid he was given.    09/23/19: Johnny [name] is a 64yo here for evaluation of urinary retention. He was having a sleep study Monday night and he developed an inability to urinate. He had a foley placed and 1300cc drained. Prior to his sleep study he had nocturia 3-5x, weak stream, urinary frequency every 3 hours. No hematuria until the catheter was placed. He strains to urinate, he has intermittency with his stream and hesitancy.  No prior BPH meds   PMH: Past Medical History:  Diagnosis Date  . Atrial fibrillation ([place])   . CKD (chronic kidney disease) stage 3, GFR 30-59 ml/min 06/10/2017  . Diabetes mellitus   . Dyslipidemia 08/20/2018  . Essential hypertension 08/03/2007   Qualifier: Diagnosis of  By: Linda Hedges MD, Heinz Knuckles  Med ARB, diuretic    . Guttate psoriasis   . HTN (hypertension)   . Hyperlipidemia   . Hyperlipidemia associated with type 2 diabetes mellitus ([place]) 08/03/2007   Qualifier: Diagnosis of  By: Linda Hedges MD, Heinz Knuckles  meds - none   . 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 Sleep apnea   . Type II diabetes mellitus with renal manifestations, uncontrolled ([place]) 08/03/2007   Qualifier: Diagnosis of  By: Linda Hedges MD, Heinz Knuckles  Med metformin     Surgical History: Past Surgical History:  Procedure Laterality Date  . CARDIOVERSION N/A 07/18/2019    Procedure: CARDIOVERSION;  Surgeon: Dorothy Spark, MD;  [facility] ENDOSCOPY;  Service: Cardiovascular;  Laterality: N/A;  . TEE WITHOUT CARDIOVERSION N/A 07/18/2019   Procedure: TRANSESOPHAGEAL ECHOCARDIOGRAM (TEE);  Surgeon: Dorothy Spark, MD;  Location: [facility] ENDOSCOPY;  Service: Cardiovascular;  Laterality: N/A;  . TOOTH EXTRACTION      Home Medications:  Allergies as of 11/04/2019   No Known Allergies     Medication List       Accurate as of November 04, 2019  2:51 PM. If you have any questions, ask your nurse or doctor.        albuterol (5 MG/ML) 0.5% nebulizer solution Commonly known as: PROVENTIL albuterol sulfate concentrate 2.5 mg/0.5 mL solution for nebulization  Inhale 0.5 mL as needed by nebulization route for 1 day.   albuterol 108 (90 Base) MCG/ACT inhaler Commonly known as: VENTOLIN HFA albuterol sulfate HFA 90 mcg/actuation aerosol inhaler  Inhale 2 puffs every 4 hours by inhalation route as needed.   alfuzosin 10 MG 24 hr tablet Commonly known as: UROXATRAL Take 1 tablet (10 mg total) by mouth daily with breakfast.   apixaban 5 MG Tabs tablet Commonly known as: ELIQUIS Take 1 tablet (5 mg total) by mouth 2 (two) times daily.   atorvastatin 80 MG tablet Commonly known as: LIPITOR Take 1 tablet (80 mg total) by mouth daily.  azithromycin 250 MG tablet Commonly known as: ZITHROMAX azithromycin 250 mg tablet  TAKE 2 TABLETS (500 MG) BY ORAL ROUTE ONCE DAILY FOR 1 DAY THEN 1 TABLET (250 MG) BY ORAL ROUTE ONCE DAILY FOR 4 DAYS   carvedilol 25 MG tablet Commonly known as: COREG Take 1 tablet (25 mg total) by mouth 2 (two) times daily with a meal.   furosemide 20 MG tablet Commonly known as: LASIX Take 1 tablet (20 mg total) by mouth daily.   glipiZIDE 10 MG tablet Commonly known as: GLUCOTROL TAKE 1 TABLET (10 MG TOTAL) BY MOUTH 2 (TWO) TIMES DAILY WITH A MEAL.   Hydromet 5-1.5 MG/5ML syrup Generic drug: HYDROcodone-homatropine Hydromet 5 mg-1.5  mg/5 mL oral syrup  Take 5 mL every 4 hours by oral route.   ipratropium 0.02 % nebulizer solution Commonly known as: ATROVENT ipratropium bromide 0.02 % solution for inhalation  Inhale 2.5 mL as needed by inhalation route for 1 day.   isosorbide-hydrALAZINE 20-37.5 MG tablet Commonly known as: BIDIL Take 1 tablet by mouth 3 (three) times daily.   metFORMIN 500 MG 24 hr tablet Commonly known as: GLUCOPHAGE-XR TAKE 1 TABLET BY MOUTH TWICE A DAY   olmesartan-hydrochlorothiazide 40-12.5 MG tablet Commonly known as: BENICAR HCT olmesartan 40 mg-hydrochlorothiazide 12.5 mg tablet  TAKE 1 TABLET BY MOUTH EVERY DAY   predniSONE 20 MG tablet Commonly known as: DELTASONE Take 20 mg by mouth 2 (two) times daily.   Trulicity 2.68 TM/1.9QQ Sopn Generic drug: Dulaglutide Inject 0.5 mLs (0.75 mg total) into the skin once a week.       Allergies: No Known Allergies  Family History: Family History  Problem Relation Age of Onset  . Hypertension Father        DOB [birth year]  . Hyperlipidemia Father   . Diabetes Father   . Coronary artery disease Father        with stents  . Nephrolithiasis Father   . Kidney disease Father        s/p rejected kidney transplant; on HD  . Memory loss Mother        DOB 71  . Kidney disease Mother        s/p nephrectomy, infection problem  . Dementia Mother   . Cancer Brother        lung  . HIV Brother   . Prostate cancer Neg Hx   . Colon cancer Neg Hx     Social History:  reports that he is a non-smoker but has been exposed to tobacco smoke. He has never used smokeless tobacco. He reports that he does not drink alcohol and does not use drugs.  ROS: All other review of systems were reviewed and are negative except what is noted above in HPI  Physical Exam: BP (!) 180/90   Pulse 82   Temp (!) 97 F (36.1 C)   Ht 6\' 5"  (1.956 m)   Wt (!) 315 lb (142.9 kg)   BMI 37.35 kg/m   Constitutional:  Alert and oriented, No acute distress. HEENT: [place]  AT, moist mucus membranes.  Trachea midline, no masses. Cardiovascular: No clubbing, cyanosis, or edema. Respiratory: Normal respiratory effort, no increased work of breathing. GI: Abdomen is soft, nontender, nondistended, no abdominal masses GU: No CVA tenderness. Circumcised phallus. No masses/lesions on penis, testis, scrotum. Prostate 40g smooth no nodules no induration.  Lymph: No cervical or inguinal lymphadenopathy. Skin: No rashes, bruises or suspicious lesions. Neurologic: Grossly intact, no focal deficits, moving all 4  extremities. Psychiatric: Normal mood and affect.  Laboratory Data: Lab Results  Component Value Date   WBC 5.1 07/18/2019   HGB 13.7 07/18/2019   HCT 43.2 07/18/2019   MCV 90.4 07/18/2019   PLT 281 07/18/2019    Lab Results  Component Value Date   CREATININE 1.94 (H) 07/25/2019    Lab Results  Component Value Date   PSA 11.14 (H) 11/19/2012   PSA 7.47 (H) 11/04/2011   PSA 3.43 04/23/2009    No results found for: TESTOSTERONE  Lab Results  Component Value Date   HGBA1C 7.9 (A) 10/31/2019    Urinalysis    Component Value Date/Time   COLORURINE STRAW (A) 09/20/2019 0421   APPEARANCEUR CLEAR 09/20/2019 0421   LABSPEC 1.006 09/20/2019 0421   PHURINE 7.0 09/20/2019 0421   GLUCOSEU NEGATIVE 09/20/2019 0421   GLUCOSEU >=1000 04/23/2009 0911   HGBUR MODERATE (A) 09/20/2019 0421   BILIRUBINUR moderate 11/04/2019 1419   KETONESUR NEGATIVE 09/20/2019 0421   PROTEINUR Positive (A) 11/04/2019 1419   PROTEINUR NEGATIVE 09/20/2019 0421   UROBILINOGEN negative (A) 11/04/2019 1419   UROBILINOGEN 0.2 04/23/2009 0911   NITRITE neg 11/04/2019 1419   NITRITE NEGATIVE 09/20/2019 0421   LEUKOCYTESUR Negative 11/04/2019 1419   LEUKOCYTESUR NEGATIVE 09/20/2019 0421    Lab Results  Component Value Date   BACTERIA NONE SEEN 09/20/2019    Pertinent Imaging:  Results for orders placed during the hospital encounter of 09/20/19  DG Abdomen 1 View    Narrative CLINICAL DATA:  Constipation and trouble breathing  EXAM: ABDOMEN - 1 VIEW  COMPARISON:  None.  FINDINGS: The bowel gas pattern is normal. No abnormal stool retention. Pelvic calcifications on the right have the appearance of phlebolith. No abnormal osseous findings.  IMPRESSION: Normal bowel gas pattern.  No abnormal stool retention.   Electronically Signed   By: Monte Fantasia M.D.   On: 09/20/2019 05:09    No results found for this or any previous visit. No results found for this or any previous visit. No results found for this or any previous visit. No results found for this or any previous visit. No results found for this or any previous visit. No results found for this or any previous visit. No results found for this or any previous visit.  UA is has trace blood and is glucose +  Assessment & Plan:    1. Urinary retention -He is voiding well on alfuzosin and will continue that med.   He will return in 6 months for a PVR.   2. Benign prostatic hyperplasia with urinary obstruction    Return in about 6 months (around 05/06/2020) for PVR.  Irine Seal, MD  [facility] [place]

## 2019-11-04 ENCOUNTER — Encounter: Payer: Self-pay | Admitting: Urology

## 2019-11-04 ENCOUNTER — Ambulatory Visit: Payer: BC Managed Care – PPO | Admitting: Urology

## 2019-11-04 ENCOUNTER — Other Ambulatory Visit: Payer: Self-pay

## 2019-11-04 VITALS — BP 180/90 | HR 82 | Temp 97.0°F | Ht 77.0 in | Wt 315.0 lb

## 2019-11-04 DIAGNOSIS — R351 Nocturia: Secondary | ICD-10-CM | POA: Diagnosis not present

## 2019-11-04 DIAGNOSIS — N401 Enlarged prostate with lower urinary tract symptoms: Secondary | ICD-10-CM

## 2019-11-04 DIAGNOSIS — N138 Other obstructive and reflux uropathy: Secondary | ICD-10-CM

## 2019-11-04 DIAGNOSIS — R339 Retention of urine, unspecified: Secondary | ICD-10-CM

## 2019-11-04 LAB — BLADDER SCAN AMB NON-IMAGING: Scan Result: 23.9

## 2019-11-04 LAB — POCT URINALYSIS DIPSTICK
Glucose, UA: POSITIVE — AB
Leukocytes, UA: NEGATIVE
Nitrite, UA: NEGATIVE
Protein, UA: POSITIVE — AB
Spec Grav, UA: >=1.03 — AB (ref 1.010–1.025)
Urobilinogen, UA: NEGATIVE E.U./dL — AB
pH, UA: 5 (ref 5.0–8.0)

## 2019-12-15 ENCOUNTER — Other Ambulatory Visit: Payer: Self-pay | Admitting: Internal Medicine

## 2019-12-23 ENCOUNTER — Other Ambulatory Visit: Payer: Self-pay | Admitting: Internal Medicine

## 2019-12-29 ENCOUNTER — Other Ambulatory Visit: Payer: Self-pay | Admitting: Internal Medicine

## 2020-01-16 ENCOUNTER — Encounter: Payer: Self-pay | Admitting: *Deleted

## 2020-01-16 NOTE — Progress Notes (Signed)
Patient ID: Johnny [name], male   DOB: 06-11-1955, 64 y.o.   MRN: [id number] Patient enrolled for [employer] to ship a 14 day ZIO XT long term holter monitor to his home.

## 2020-02-03 ENCOUNTER — Encounter: Payer: Self-pay | Admitting: Internal Medicine

## 2020-02-03 ENCOUNTER — Ambulatory Visit (INDEPENDENT_AMBULATORY_CARE_PROVIDER_SITE_OTHER): Payer: BC Managed Care – PPO | Admitting: Internal Medicine

## 2020-02-03 ENCOUNTER — Other Ambulatory Visit: Payer: Self-pay

## 2020-02-03 VITALS — BP 140/84 | HR 100 | Ht 77.0 in | Wt 329.0 lb

## 2020-02-03 DIAGNOSIS — E1165 Type 2 diabetes mellitus with hyperglycemia: Secondary | ICD-10-CM | POA: Diagnosis not present

## 2020-02-03 DIAGNOSIS — E1122 Type 2 diabetes mellitus with diabetic chronic kidney disease: Secondary | ICD-10-CM | POA: Diagnosis not present

## 2020-02-03 DIAGNOSIS — N1831 Chronic kidney disease, stage 3a: Secondary | ICD-10-CM | POA: Diagnosis not present

## 2020-02-03 DIAGNOSIS — IMO0002 Reserved for concepts with insufficient information to code with codable children: Secondary | ICD-10-CM

## 2020-02-03 DIAGNOSIS — E1129 Type 2 diabetes mellitus with other diabetic kidney complication: Secondary | ICD-10-CM | POA: Diagnosis not present

## 2020-02-03 LAB — BASIC METABOLIC PANEL
BUN: 21 mg/dL (ref 6–23)
CO2: 29 mEq/L (ref 19–32)
Calcium: 9.1 mg/dL (ref 8.4–10.5)
Chloride: 107 mEq/L (ref 96–112)
Creatinine, Ser: 1.93 mg/dL — ABNORMAL HIGH (ref 0.40–1.50)
GFR: 42.92 mL/min — ABNORMAL LOW (ref >60.00)
Glucose, Bld: 75 mg/dL (ref 70–99)
Potassium: 3.7 mEq/L (ref 3.5–5.1)
Sodium: 144 mEq/L (ref 135–145)

## 2020-02-03 LAB — POCT GLYCOSYLATED HEMOGLOBIN (HGB A1C): Hemoglobin A1C: 6.9 % — AB (ref 4.0–5.6)

## 2020-02-03 LAB — MICROALBUMIN / CREATININE URINE RATIO
Creatinine,U: 122.9 mg/dL
Microalb Creat Ratio: 7.9 mg/g (ref 0.0–30.0)
Microalb, Ur: 9.8 mg/dL — ABNORMAL HIGH (ref 0.0–1.9)

## 2020-02-03 MED ORDER — GLIPIZIDE 5 MG PO TABS: 5.0000 mg | ORAL_TABLET | Freq: Two times a day (BID) | ORAL | 3 refills | Status: DC

## 2020-02-03 MED ORDER — TRULICITY 1.5 MG/0.5ML ~~LOC~~ SOAJ
1.5000 mg | SUBCUTANEOUS | 11 refills | Status: DC
Start: 2020-02-03 — End: 2020-03-28

## 2020-02-03 NOTE — Progress Notes (Signed)
Name: Johnny [name]  Age/ Sex: 64 y.o., male   MRN/ DOB: [id number], 30-Sep-1955     PCP: [name], MD   Reason for Endocrinology Evaluation: Type 2 Diabetes Mellitus  Initial Endocrine Consultative Visit: 08/20/2018    PATIENT IDENTIFIER: Johnny [name] is a 64 y.o. male with a past medical history of T2DM, psoriasis, HTN and OSA (Dx 08/2019) . The patient has followed with Endocrinology clinic since 08/20/2018 for consultative assistance with management of his diabetes.  DIABETIC HISTORY:  Johnny [name] was diagnosed with T2DM in 2012,. He was on Metformin,Farxiga and Glimepiride in the past. He has never been on insulin. His hemoglobin A1c has ranged from 9.0 % in 2013, peaking at 13.5% in 06/2018.  On his initial visit to our clinic his A1c was 13.5%. He was not taking any of his meds. He was restarted on Metformin and glipizide.   Hyperaldosteronism was ruled out in 09/2018 for extreme HTN  SUBJECTIVE:   During the last visit (10/31/2019): A1c 7.9 %.  we continued metformin and Glipizide and started Trulicity  Today (60/05/930): Johnny [name] is here for a 4 month  follow up on his diabetes.  He checks his blood sugars 2 times daily, preprandial to breakfast and supper. The patient has had hypoglycemic episodes since the last clinic visit.   He has not heard anything about CPAP machine      HOME DIABETES REGIMEN:   Metformin 500 mg XR 1 tablets BID with meals  Glipizide 10 mg,  1 tablets BID with meals  Trulicity 3.55 mg weekly    METER DOWNLOAD SUMMARY: 9/19-10/05/2019 Overall Mean FS Glucose = 94 Standard Deviation = 17   BG Ranges: Low = 54 High = 126   Hypoglycemic Events/30 Days: BG < 50 = 0 Episodes of symptomatic severe hypoglycemia = 0     HISTORY:  Past Medical History:  Past Medical History:  Diagnosis Date  . Atrial fibrillation ([place])   . CKD (chronic kidney disease) stage 3, GFR 30-59 ml/min ([place]) 06/10/2017  . Diabetes  mellitus   . Dyslipidemia 08/20/2018  . Essential hypertension 08/03/2007   Qualifier: Diagnosis of  By: Linda Hedges MD, Heinz Knuckles  Med ARB, diuretic    . Guttate psoriasis   . HTN (hypertension)   . Hyperlipidemia   . Hyperlipidemia associated with type 2 diabetes mellitus ([place]) 08/03/2007   Qualifier: Diagnosis of  By: Linda Hedges MD, Heinz Knuckles  meds - none   . 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 Sleep apnea   . Type II diabetes mellitus with renal manifestations, uncontrolled ([place]) 08/03/2007   Qualifier: Diagnosis of  By: Linda Hedges MD, Heinz Knuckles  Med metformin    Past Surgical History:  Past Surgical History:  Procedure Laterality Date  . CARDIOVERSION N/A 07/18/2019   Procedure: CARDIOVERSION;  Surgeon: Dorothy Spark, MD;  Location: [facility] ENDOSCOPY;  Service: Cardiovascular;  Laterality: N/A;  . TEE WITHOUT CARDIOVERSION N/A 07/18/2019   Procedure: TRANSESOPHAGEAL ECHOCARDIOGRAM (TEE);  Surgeon: Dorothy Spark, MD;  Location: [facility];  Service: Cardiovascular;  Laterality: N/A;  . TOOTH EXTRACTION      Social History:  reports that he is a non-smoker but has been exposed to tobacco smoke. He has never used smokeless tobacco. He reports that he does not drink alcohol and does not use drugs. Family History:  Family History  Problem Relation Age of Onset  . Hypertension  Father        DOB [birth year]  . Hyperlipidemia Father   . Diabetes Father   . Coronary artery disease Father        with stents  . Nephrolithiasis Father   . Kidney disease Father        s/p rejected kidney transplant; on HD  . Memory loss Mother        DOB 30  . Kidney disease Mother        s/p nephrectomy, infection problem  . Dementia Mother   . Cancer Brother        lung  . HIV Brother   . Prostate cancer Neg Hx   . Colon cancer Neg Hx      HOME MEDICATIONS: Allergies as of 02/03/2020   No Known Allergies     Medication List       Accurate as of  February 03, 2020  2:22 PM. If you have any questions, ask your nurse or doctor.        albuterol (5 MG/ML) 0.5% nebulizer solution Commonly known as: PROVENTIL albuterol sulfate concentrate 2.5 mg/0.5 mL solution for nebulization  Inhale 0.5 mL as needed by nebulization route for 1 day.   albuterol 108 (90 Base) MCG/ACT inhaler Commonly known as: VENTOLIN HFA albuterol sulfate HFA 90 mcg/actuation aerosol inhaler  Inhale 2 puffs every 4 hours by inhalation route as needed.   alfuzosin 10 MG 24 hr tablet Commonly known as: UROXATRAL Take 1 tablet (10 mg total) by mouth daily with breakfast.   apixaban 5 MG Tabs tablet Commonly known as: ELIQUIS Take 1 tablet (5 mg total) by mouth 2 (two) times daily.   atorvastatin 80 MG tablet Commonly known as: LIPITOR Take 1 tablet (80 mg total) by mouth daily.   azithromycin 250 MG tablet Commonly known as: ZITHROMAX azithromycin 250 mg tablet  TAKE 2 TABLETS (500 MG) BY ORAL ROUTE ONCE DAILY FOR 1 DAY THEN 1 TABLET (250 MG) BY ORAL ROUTE ONCE DAILY FOR 4 DAYS   carvedilol 25 MG tablet Commonly known as: COREG Take 1 tablet (25 mg total) by mouth 2 (two) times daily with a meal.   furosemide 20 MG tablet Commonly known as: LASIX Take 1 tablet (20 mg total) by mouth daily.   glipiZIDE 10 MG tablet Commonly known as: GLUCOTROL TAKE 1 TABLET (10 MG TOTAL) BY MOUTH 2 (TWO) TIMES DAILY WITH A MEAL.   Hydromet 5-1.5 MG/5ML syrup Generic drug: HYDROcodone-homatropine Hydromet 5 mg-1.5 mg/5 mL oral syrup  Take 5 mL every 4 hours by oral route.   ipratropium 0.02 % nebulizer solution Commonly known as: ATROVENT ipratropium bromide 0.02 % solution for inhalation  Inhale 2.5 mL as needed by inhalation route for 1 day.   isosorbide-hydrALAZINE 20-37.5 MG tablet Commonly known as: BIDIL Take 1 tablet by mouth 3 (three) times daily.   metFORMIN 500 MG 24 hr tablet Commonly known as: GLUCOPHAGE-XR TAKE 1 TABLET BY MOUTH TWICE A DAY    olmesartan-hydrochlorothiazide 40-12.5 MG tablet Commonly known as: BENICAR HCT TAKE 1 TABLET BY MOUTH EVERY DAY   predniSONE 20 MG tablet Commonly known as: DELTASONE Take 20 mg by mouth 2 (two) times daily.   Trulicity 9.67 EL/3.8BO Sopn Generic drug: Dulaglutide INJECT 0.5MLS INTO THE SKIN ONCE A WEEK        OBJECTIVE:   Vital Signs: BP 140/84 (BP Location: Right Arm, Patient Position: Sitting, Cuff Size: Normal)   Pulse 100   Ht 6\' 5"  (1.956  m)   Wt (!) 329 lb (149.2 kg)   SpO2 96%   BMI 39.01 kg/m   Wt Readings from Last 3 Encounters:  02/03/20 (!) 329 lb (149.2 kg)  11/04/19 (!) 315 lb (142.9 kg)  10/31/19 (!) 317 lb (143.8 kg)     Exam: General: Pt appears well and is in NAD  Neck: General: Supple without adenopathy. Thyroid: Thyroid size normal.  No goiter or nodules appreciated. No thyroid bruit.  Lungs: Clear with good BS bilat with no rales, rhonchi, or wheezes  Heart: RRR with normal S1 and S2 and no gallops; no murmurs; no rub  Extremities: 1+ pretibial edema.  Skin: Normal texture and temperature to palpation. No rash noted.  Neuro: MS is good with appropriate affect, pt is alert and Ox3    DM foot exam: 02/03/2020 The skin of the feet is without sores or ulcerations, but has extensive plantar callous formation bilaterally.Nails thickened, discolored and curved The pedal pulses are undetectable today due to edema  The sensation is decreased to a screening 5.07, 10 gram monofilament bilaterally           DATA REVIEWED:  Lab Results  Component Value Date   HGBA1C 6.9 (A) 02/03/2020   HGBA1C 7.9 (A) 10/31/2019   HGBA1C 8.4 (H) 07/14/2019   Lab Results  Component Value Date   MICROALBUR 14.8 (H) 06/11/2018   LDLCALC 79 09/05/2019   CREATININE 1.94 (H) 07/25/2019   Results for [name], [name] (MRN [id number]) as of 02/06/2020 12:36  Ref. Range 02/03/2020 14:49  Sodium Latest Ref Range: 135 - 145 mEq/L 144  Potassium Latest Ref Range: 3.5 -  5.1 mEq/L 3.7  Chloride Latest Ref Range: 96 - 112 mEq/L 107  CO2 Latest Ref Range: 19 - 32 mEq/L 29  Glucose Latest Ref Range: 70 - 99 mg/dL 75  BUN Latest Ref Range: 6 - 23 mg/dL 21  Creatinine Latest Ref Range: 0.40 - 1.50 mg/dL 1.93 (H)  Calcium Latest Ref Range: 8.4 - 10.5 mg/dL 9.1  GFR Latest Ref Range: >60.00 mL/min 42.92 (L)  MICROALB/CREAT RATIO Latest Ref Range: 0.0 - 30.0 mg/g 7.9  Creatinine,U Latest Units: mg/dL 122.9  Microalb, Ur Latest Ref Range: 0.0 - 1.9 mg/dL 9.8 (H)   ASSESSMENT / PLAN / RECOMMENDATIONS:   1) Type 2 Diabetes Mellitus, Optimally controlled, With CKD III and neuropathic  complications - Most recent A1c of 6.9%. Goal A1c < 7.0 %.   - I have praised the pt on optimal glycemic control  - Will increase Trulicity and decrease Glipizide as below    MEDICATIONS:  Continue Metformin 500 mg BID   Decrease Glipizide to 5 mg, 1 Tablet before Breakfast and 1 tablet before supper   Increase Trulicity to 1.5 mg weekly   EDUCATION / INSTRUCTIONS:  BG monitoring instructions: Patient is instructed to check his blood sugars 2 times a day, fasting and bedtime.  Call [facility] if: BG persistently < 70  . I reviewed the Rule of 15 for the treatment of hypoglycemia in detail with the patient. Literature supplied.  2. Prednisone use :  - He is not sure he is using it. But its on his medication list. - Pt will go home and check his bottles and let me know   F/U in 3 months    Signed electronically by: Mack Guise, MD  [facility] Endocrinology  [facility] [address] [place], [place] [address] Phone: [phone] FAX:  [phone]   CC: [name], [place] [address] Phone: [phone]  Fax: [phone]  Return to Endocrinology clinic as below: Future Appointments  Date Time Provider [place]  05/11/2020  3:00 PM Irine Seal, MD [facility] None

## 2020-02-03 NOTE — Patient Instructions (Addendum)
-   Continue Metformin 500 mg 1 tablet with Breakfast and 1 tablet with supper - Decrease glipizide 5 mg, 1 tablet before breakfast and Supper - Increase Trulicity 1.5 mg once a week         - HOW TO TREAT LOW BLOOD SUGARS (Blood sugar LESS THAN 70 MG/DL)  Please follow the RULE OF 15 for the treatment of hypoglycemia treatment (when your (blood sugars are less than 70 mg/dL)    STEP 1: Take 15 grams of carbohydrates when your blood sugar is low, which includes:   3-4 GLUCOSE TABS  OR  3-4 OZ OF JUICE OR REGULAR SODA OR  ONE TUBE OF GLUCOSE GEL     STEP 2: RECHECK blood sugar in 15 MINUTES STEP 3: If your blood sugar is still low at the 15 minute recheck --> then, go back to STEP 1 and treat AGAIN with another 15 grams of carbohydrates.

## 2020-02-06 ENCOUNTER — Encounter: Payer: Self-pay | Admitting: Internal Medicine

## 2020-03-02 ENCOUNTER — Encounter: Payer: Self-pay | Admitting: Podiatry

## 2020-03-02 ENCOUNTER — Other Ambulatory Visit: Payer: Self-pay

## 2020-03-02 ENCOUNTER — Ambulatory Visit: Payer: BC Managed Care – PPO | Admitting: Podiatry

## 2020-03-02 DIAGNOSIS — L853 Xerosis cutis: Secondary | ICD-10-CM | POA: Diagnosis not present

## 2020-03-02 DIAGNOSIS — E1142 Type 2 diabetes mellitus with diabetic polyneuropathy: Secondary | ICD-10-CM | POA: Diagnosis not present

## 2020-03-02 DIAGNOSIS — M79675 Pain in left toe(s): Secondary | ICD-10-CM | POA: Diagnosis not present

## 2020-03-02 DIAGNOSIS — M79674 Pain in right toe(s): Secondary | ICD-10-CM | POA: Diagnosis not present

## 2020-03-02 DIAGNOSIS — B351 Tinea unguium: Secondary | ICD-10-CM

## 2020-03-02 MED ORDER — AMMONIUM LACTATE 12 % EX LOTN: 1.0000 "application " | TOPICAL_LOTION | CUTANEOUS | 0 refills | Status: DC | PRN

## 2020-03-02 NOTE — Progress Notes (Signed)
  Subjective:  Patient ID: Johnny [name], male    DOB: 1956/01/29,  MRN: [id number]  Chief Complaint  Patient presents with  . routine foot care    nail trim diabetic foot care   64 y.o. male returns for the above complaint.  Patient presents with thickened elongated dystrophic toenails x10.  Patient is a painful to walk on.  Patient would like to have them debrided down as he is not able to do it himself.  Patient is a diabetic with last A1c of 6.9.  He also has secondary complaint of xerosis/dry skin that is severe in nature to bilateral lower extremity.  Patient would like to discuss treatment options for this.  Has tried all the lower over-the-counter options which has not helped.  Objective:  There were no vitals filed for this visit. Podiatric Exam: Vascular: dorsalis pedis and posterior tibial pulses are palpable bilateral. Capillary return is immediate. Temperature gradient is WNL. Skin turgor WNL  Sensorium: Normal Semmes Weinstein monofilament test. Normal tactile sensation bilaterally. Nail Exam: Pt has thick disfigured discolored nails with subungual debris noted bilateral entire nail hallux through fifth toenails.  Pain on palpation to the nails. Ulcer Exam: There is no evidence of ulcer or pre-ulcerative changes or infection. Orthopedic Exam: Muscle tone and strength are WNL. No limitations in general ROM. No crepitus or effusions noted. HAV  B/L.  Hammer toes 2-5  B/L. Skin: No Porokeratosis. No infection or ulcers.  Bilateral severe xerosis noted to bilateral lower extremity    Assessment & Plan:   1. Type 2 diabetes mellitus with diabetic polyneuropathy, without long-term current use of insulin ([place])   2. Xerosis of skin   3. Pain due to onychomycosis of toenails of both feet     Patient was evaluated and treated and all questions answered.  Xerosis skin severe bilateral lower extremity -I explained to the patient the etiology of xerosis and various treatment  options were extensively discussed.  I explained to the patient the importance of maintaining moisturization of the skin with application of over-the-counter lotion such as Eucerin or Luciderm which she has failed.  I believe patient will benefit from prescription lotion.  Ammonium lactate was sent to the pharmacy.  I have asked him to apply twice a day.  Patient states understanding   Onychomycosis with pain  -Nails palliatively debrided as below. -Educated on self-care  Procedure: Nail Debridement Rationale: pain  Type of Debridement: manual, sharp debridement. Instrumentation: Nail nipper, rotary burr. Number of Nails: 10  Procedures and Treatment: Consent by patient was obtained for treatment procedures. The patient understood the discussion of treatment and procedures well. All questions were answered thoroughly reviewed. Debridement of mycotic and hypertrophic toenails, 1 through 5 bilateral and clearing of subungual debris. No ulceration, no infection noted.  Return Visit-Office Procedure: Patient instructed to return to the office for a follow up visit 3 months for continued evaluation and treatment.  Boneta Lucks, DPM    No follow-ups on file.

## 2020-03-27 ENCOUNTER — Emergency Department (HOSPITAL_COMMUNITY): Payer: BC Managed Care – PPO

## 2020-03-27 ENCOUNTER — Observation Stay (HOSPITAL_COMMUNITY)
Admission: EM | Admit: 2020-03-27 | Discharge: 2020-03-28 | Disposition: A | Discharge status: Home / Self Care | Payer: BC Managed Care – PPO | Attending: Emergency Medicine | Admitting: Emergency Medicine

## 2020-03-27 ENCOUNTER — Other Ambulatory Visit: Payer: Self-pay

## 2020-03-27 ENCOUNTER — Encounter (HOSPITAL_COMMUNITY): Payer: Self-pay | Admitting: Emergency Medicine

## 2020-03-27 DIAGNOSIS — N1832 Chronic kidney disease, stage 3b: Secondary | ICD-10-CM | POA: Diagnosis not present

## 2020-03-27 DIAGNOSIS — I483 Typical atrial flutter: Secondary | ICD-10-CM | POA: Diagnosis present

## 2020-03-27 DIAGNOSIS — N179 Acute kidney failure, unspecified: Secondary | ICD-10-CM | POA: Insufficient documentation

## 2020-03-27 DIAGNOSIS — I10 Essential (primary) hypertension: Secondary | ICD-10-CM | POA: Diagnosis present

## 2020-03-27 DIAGNOSIS — I5043 Acute on chronic combined systolic (congestive) and diastolic (congestive) heart failure: Secondary | ICD-10-CM | POA: Insufficient documentation

## 2020-03-27 DIAGNOSIS — G4730 Sleep apnea, unspecified: Secondary | ICD-10-CM

## 2020-03-27 DIAGNOSIS — E1142 Type 2 diabetes mellitus with diabetic polyneuropathy: Secondary | ICD-10-CM | POA: Diagnosis not present

## 2020-03-27 DIAGNOSIS — E1122 Type 2 diabetes mellitus with diabetic chronic kidney disease: Secondary | ICD-10-CM | POA: Insufficient documentation

## 2020-03-27 DIAGNOSIS — Z20822 Contact with and (suspected) exposure to covid-19: Secondary | ICD-10-CM | POA: Insufficient documentation

## 2020-03-27 DIAGNOSIS — Z9114 Patient's other noncompliance with medication regimen: Secondary | ICD-10-CM

## 2020-03-27 DIAGNOSIS — N401 Enlarged prostate with lower urinary tract symptoms: Secondary | ICD-10-CM | POA: Insufficient documentation

## 2020-03-27 DIAGNOSIS — I4892 Unspecified atrial flutter: Secondary | ICD-10-CM | POA: Diagnosis not present

## 2020-03-27 DIAGNOSIS — E278 Other specified disorders of adrenal gland: Secondary | ICD-10-CM | POA: Diagnosis not present

## 2020-03-27 DIAGNOSIS — N138 Other obstructive and reflux uropathy: Secondary | ICD-10-CM | POA: Diagnosis present

## 2020-03-27 DIAGNOSIS — R7401 Elevation of levels of liver transaminase levels: Secondary | ICD-10-CM | POA: Diagnosis not present

## 2020-03-27 DIAGNOSIS — R339 Retention of urine, unspecified: Secondary | ICD-10-CM | POA: Diagnosis present

## 2020-03-27 DIAGNOSIS — E1165 Type 2 diabetes mellitus with hyperglycemia: Secondary | ICD-10-CM | POA: Diagnosis present

## 2020-03-27 DIAGNOSIS — R338 Other retention of urine: Secondary | ICD-10-CM | POA: Insufficient documentation

## 2020-03-27 DIAGNOSIS — G4733 Obstructive sleep apnea (adult) (pediatric): Secondary | ICD-10-CM

## 2020-03-27 DIAGNOSIS — I13 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 1 through stage 4 chronic kidney disease, or unspecified chronic kidney disease: Secondary | ICD-10-CM | POA: Insufficient documentation

## 2020-03-27 DIAGNOSIS — E785 Hyperlipidemia, unspecified: Secondary | ICD-10-CM | POA: Insufficient documentation

## 2020-03-27 DIAGNOSIS — N189 Chronic kidney disease, unspecified: Secondary | ICD-10-CM

## 2020-03-27 DIAGNOSIS — K5900 Constipation, unspecified: Secondary | ICD-10-CM | POA: Diagnosis present

## 2020-03-27 DIAGNOSIS — I5022 Chronic systolic (congestive) heart failure: Secondary | ICD-10-CM

## 2020-03-27 DIAGNOSIS — Z6837 Body mass index (BMI) 37.0-37.9, adult: Secondary | ICD-10-CM | POA: Diagnosis not present

## 2020-03-27 DIAGNOSIS — Z794 Long term (current) use of insulin: Secondary | ICD-10-CM | POA: Insufficient documentation

## 2020-03-27 DIAGNOSIS — E669 Obesity, unspecified: Secondary | ICD-10-CM | POA: Diagnosis not present

## 2020-03-27 HISTORY — DX: Constipation, unspecified: K59.00

## 2020-03-27 LAB — CBC WITH DIFFERENTIAL/PLATELET
Abs Immature Granulocytes: 0.02 10*3/uL (ref 0.00–0.07)
Basophils Absolute: 0 10*3/uL (ref 0.0–0.1)
Basophils Relative: 0 %
Eosinophils Absolute: 0 10*3/uL (ref 0.0–0.5)
Eosinophils Relative: 0 %
HCT: 46.8 % (ref 39.0–52.0)
Hemoglobin: 14.5 g/dL (ref 13.0–17.0)
Immature Granulocytes: 0 %
Lymphocytes Relative: 9 %
Lymphs Abs: 0.7 10*3/uL (ref 0.7–4.0)
MCH: 27.9 pg (ref 26.0–34.0)
MCHC: 31 g/dL (ref 30.0–36.0)
MCV: 90.2 fL (ref 80.0–100.0)
Monocytes Absolute: 0.5 10*3/uL (ref 0.1–1.0)
Monocytes Relative: 6 %
Neutro Abs: 7.1 10*3/uL (ref 1.7–7.7)
Neutrophils Relative %: 85 %
Platelets: 246 10*3/uL (ref 150–400)
RBC: 5.19 MIL/uL (ref 4.22–5.81)
RDW: 14.6 % (ref 11.5–15.5)
WBC: 8.4 10*3/uL (ref 4.0–10.5)
nRBC: 0 % (ref 0.0–0.2)

## 2020-03-27 LAB — URINALYSIS, ROUTINE W REFLEX MICROSCOPIC
Bilirubin Urine: NEGATIVE
Glucose, UA: 50 mg/dL — AB
Ketones, ur: NEGATIVE mg/dL
Leukocytes,Ua: NEGATIVE
Nitrite: NEGATIVE
Protein, ur: 100 mg/dL — AB
RBC / HPF: >50 RBC/hpf — ABNORMAL HIGH (ref 0–5)
Specific Gravity, Urine: 1.011 (ref 1.005–1.030)
pH: 5 (ref 5.0–8.0)

## 2020-03-27 LAB — COMPREHENSIVE METABOLIC PANEL
ALT: 69 U/L — ABNORMAL HIGH (ref 0–44)
AST: 32 U/L (ref 15–41)
Albumin: 4.1 g/dL (ref 3.5–5.0)
Alkaline Phosphatase: 96 U/L (ref 38–126)
Anion gap: 13 (ref 5–15)
BUN: 22 mg/dL (ref 8–23)
CO2: 23 mmol/L (ref 22–32)
Calcium: 9.4 mg/dL (ref 8.9–10.3)
Chloride: 106 mmol/L (ref 98–111)
Creatinine, Ser: 2.35 mg/dL — ABNORMAL HIGH (ref 0.61–1.24)
GFR, Estimated: 31 mL/min — ABNORMAL LOW (ref >60)
Glucose, Bld: 168 mg/dL — ABNORMAL HIGH (ref 70–99)
Potassium: 4 mmol/L (ref 3.5–5.1)
Sodium: 142 mmol/L (ref 135–145)
Total Bilirubin: 0.9 mg/dL (ref 0.3–1.2)
Total Protein: 7.3 g/dL (ref 6.5–8.1)

## 2020-03-27 LAB — TROPONIN I (HIGH SENSITIVITY)
Troponin I (High Sensitivity): 15 ng/L (ref <18)
Troponin I (High Sensitivity): 11 ng/L (ref <18)

## 2020-03-27 LAB — RESPIRATORY PANEL BY RT PCR (FLU A&B, COVID)
Influenza A by PCR: NEGATIVE
Influenza B by PCR: NEGATIVE
SARS Coronavirus 2 by RT PCR: NEGATIVE

## 2020-03-27 MED ORDER — DILTIAZEM HCL 25 MG/5ML IV SOLN
15.0000 mg | Freq: Once | INTRAVENOUS | Status: AC
  Administered 2020-03-27: 15 mg via INTRAVENOUS
  Filled 2020-03-27: qty 5

## 2020-03-27 MED ORDER — BISACODYL 5 MG PO TBEC
5.0000 mg | DELAYED_RELEASE_TABLET | Freq: Once | ORAL | Status: AC
  Administered 2020-03-27: 5 mg via ORAL
  Filled 2020-03-27: qty 1

## 2020-03-27 MED ORDER — FLEET ENEMA 7-19 GM/118ML RE ENEM
1.0000 | ENEMA | Freq: Once | RECTAL | Status: AC
  Administered 2020-03-28: 1 via RECTAL
  Filled 2020-03-27: qty 1

## 2020-03-27 MED ORDER — PROPOFOL 10 MG/ML IV BOLUS
INTRAVENOUS | Status: AC | PRN
  Administered 2020-03-27 (×2): 40 mg via INTRAVENOUS
  Administered 2020-03-27: 20 mg via INTRAVENOUS
  Administered 2020-03-27: 40 mg via INTRAVENOUS

## 2020-03-27 MED ORDER — PROPOFOL 10 MG/ML IV BOLUS
0.5000 mg/kg | Freq: Once | INTRAVENOUS | Status: AC
  Administered 2020-03-27: 72.6 mg via INTRAVENOUS
  Filled 2020-03-27: qty 20

## 2020-03-27 MED ORDER — OLMESARTAN MEDOXOMIL-HCTZ 40-12.5 MG PO TABS: 1.0000 | ORAL_TABLET | Freq: Every day | ORAL | Status: DC

## 2020-03-27 MED ORDER — DILTIAZEM HCL-DEXTROSE 125-5 MG/125ML-% IV SOLN (PREMIX)
5.0000 mg/h | INTRAVENOUS | Status: DC
  Administered 2020-03-27: 5 mg/h via INTRAVENOUS
  Filled 2020-03-27: qty 125

## 2020-03-27 NOTE — ED Provider Notes (Signed)
Patient is a 64 year old male whose care was transferred to me by [facility] PA-C.  Her HPI is below:  Johnny [name] is a 64 y.o. male with past medical history significant for Afib, flutter on anticoagulation, DM, HTN, BPH who presents for evaluation of constipation and abd pain. Last BM 1 week ago. Typically going 2 times daily. Not passing gas. Also noted no urination since 1700 yesterday. Feels suprapubic fullness. Has had foley previously for similar. Seen at [facility] for symptoms and noted to be HTN and in Aflutter with RVR. Patient states he does have some palpitations however this was due to his abdominal pain and difficulty with bowel movements and urination.  He denies any chest pain.  Does have some mild shortness of breath however states he has shortness of breath at baseline.  States he has been compliant with his antiarrhythmics as well as his anticoagulation.  Has not missed any doses.  Does take blood pressure medications 3 times daily.  He is due for his next dose of blood pressure medications.  Denies any headache, lightness, dizziness, chest pain, hemoptysis, hematuria.  Denies additional aggravating or alleviating factors. Feels constipated.   Has intermittent palpitations at home frequent however no persistent palpitations.  History obtained from patient and past medical records. No  interpreter is used.  Physical Exam  BP (!) 175/129    Pulse (!) 130    Temp 99.3 F (37.4 C) (Oral)    Resp (!) 22    Ht 6\' 5"  (1.956 m)    Wt (!) 145.2 kg    SpO2 93%    BMI 37.95 kg/m   Physical Exam  ED Course/Procedures     Procedures Vitals and nursing note reviewed.  Constitutional:      General: He is not in acute distress.    Appearance: He is well-developed. He is not ill-appearing, toxic-appearing or diaphoretic.  HENT:     Head: Normocephalic and atraumatic.     Mouth/Throat:     Mouth: Mucous membranes are moist.  Eyes:     Pupils: Pupils are equal, round, and reactive to  light.  Cardiovascular:     Rate and Rhythm: Regular rhythm. Tachycardia present.     Comments: A flutter Pulmonary:     Effort: Pulmonary effort is normal. No respiratory distress.     Breath sounds: Normal breath sounds.     Comments: Speaks in full sentences without difficulty. Abdominal:     General: Bowel sounds are normal. There is no distension.     Palpations: Abdomen is soft.     Tenderness: There is abdominal tenderness in the right lower quadrant, suprapubic area and left lower quadrant. There is no right CVA tenderness, left CVA tenderness, guarding or rebound. Negative signs include Murphy's sign and McBurney's sign.     Hernia: No hernia is present.  Musculoskeletal:        General: Normal range of motion.     Cervical back: Normal range of motion and neck supple.  Skin:    General: Skin is warm and dry.     Capillary Refill: Capillary refill takes 2 to 3 seconds.  Neurological:     General: No focal deficit present.     Mental Status: He is alert and oriented to person, place, and time.  MDM  Patient is a 64 year old male whose care was transferred to me at shift change.  He presents today due to constipation for 1 week.  Not passing gas.  Also no  urination since 5 PM yesterday.  Patient was in and out cath producing 2000 cc of urine output.  Patient does have a history of BPH and is needed a Foley catheter in the past.  CT scan of the abdomen and pelvis obtained.  Independently reviewed and interpreted.  Findings as noted below:  IMPRESSION:  1. Large amount of stool in the colon consistent with constipation.  No evidence for bowel obstruction.  2. Markedly enlarged prostate gland.  3. Bilateral hypodense adrenal nodules, probably represent adenomas.  4. Nonspecific moderate perinephric stranding and fluid. Mild renal  pelvis dilatation and hydroureter bilaterally but no obstructing  stone disease.    Feel the patient's renal pelvis dilatation as well as  hydroureter is likely secondary to his urinary retention.  Elevated creatinine 2.35 up from 1.93, 1 month ago.  Again, likely secondary to his urinary retention.  Possible AKI.  Patient is followed by Dr. Jeffie Pollock with urology.  He takes alfuzosin.   Patient noted to be in a flutter with RVR.  Patient is anticoagulated on Eliquis but does have a history of noncompliance.  Given his noncompliance history did not feel that cardioversion was reasonable.  Started on Cardizem drip.  Not responding well.  Discussed with cardiology and they will evaluate.  Cardiology evaluated and feel comfortable with [place] moving forward with cardioversion.  This was performed successfully.  Patient now in normal sinus rhythm.  Cardiology understands the patient will be admitted to the hospital and they are planning on following him.  Will admit to medicine.     Rayna Sexton, PA-C 03/27/20 2128    Veryl Speak, MD 03/27/20 2216

## 2020-03-27 NOTE — ED Triage Notes (Signed)
Pt BIB [facility] from [facility]. Complaint of abdominal pain and constipation since last night. At urgent care pt hypertensive and in a flutter, with history of both. Pt A&Ox4. VSS. NAD.

## 2020-03-27 NOTE — ED Provider Notes (Signed)
[facility] EMERGENCY DEPARTMENT Provider Note   CSN: [id number] Arrival date & time: 03/27/20  1049    History Chief Complaint  Patient presents with   Abdominal Pain   Constipation    Johnny [name] is a 64 y.o. male with past medical history significant for Afib, flutter on anticoagulation, DM, HTN, BPH who presents for evaluation of constipation and abd pain. Last BM 1 week ago. Typically going 2 times daily. Not passing gas. Also noted no urination since 1700 yesterday. Feels suprapubic fullness. Has had foley previously for similar. Seen at [facility] for symptoms and noted to be HTN and in Aflutter with RVR. Patient states he does have some palpitations however this was due to his abdominal pain and difficulty with bowel movements and urination.  He denies any chest pain.  Does have some mild shortness of breath however states he has shortness of breath at baseline.  States he has been compliant with his antiarrhythmics as well as his anticoagulation.  Has not missed any doses.  Does take blood pressure medications 3 times daily.  He is due for his next dose of blood pressure medications.  Denies any headache, lightness, dizziness, chest pain, hemoptysis, hematuria.  Denies additional aggravating or alleviating factors. Feels constipated.   Has intermittent palpitations at home frequent however no persistent palpitations.  History obtained from patient and past medical records. No  interpreter is used.  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   Qualifier: Diagnosis of  By: Linda Hedges MD, Heinz Knuckles    Sleep apnea    Type II diabetes mellitus with renal manifestations, uncontrolled ([place]) 08/03/2007   Qualifier: Diagnosis of  By: Linda Hedges MD, Heinz Knuckles  Med metformin     Patient Active Problem List   Diagnosis Date Noted   Urinary retention 09/23/2019   Benign prostatic hyperplasia with urinary obstruction 09/23/2019   Atrial flutter with rapid ventricular response ([place]) 07/15/2019   Acute on chronic combined systolic and diastolic CHF (congestive heart failure) ([place])    Medically noncompliant    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 1 through stage 4 chronic kidney disease, or chronic kidney disease ([place])    Atrial flutter ([place]) 07/14/2019   Type 2 diabetes mellitus with hyperglycemia, without long-term current use of insulin ([place]) 03/28/2019   Type 2 diabetes mellitus with diabetic polyneuropathy, without long-term current use of insulin ([place]) 03/28/2019   Type 2 diabetes mellitus with stage 3 chronic kidney disease, without long-term current use of insulin ([place]) 01/03/2019   Dyslipidemia 08/20/2018   CKD (chronic kidney disease) stage 3, GFR 30-59 ml/min ([place]) 06/10/2017   Routine health maintenance 11/06/2011   PSORIASIS, 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   Type II diabetes mellitus with renal manifestations, uncontrolled ([place]) 08/03/2007   Hyperlipidemia associated with type 2 diabetes mellitus ([place]) 08/03/2007   Essential hypertension 08/03/2007    Past Surgical History:  Procedure Laterality Date   CARDIOVERSION N/A 07/18/2019   Procedure: CARDIOVERSION;  Surgeon: Dorothy Spark, MD;  Location: [facility] ENDOSCOPY;  Service: Cardiovascular;  Laterality: N/A;   TEE WITHOUT CARDIOVERSION N/A 07/18/2019   Procedure: TRANSESOPHAGEAL ECHOCARDIOGRAM (TEE);  Surgeon: Dorothy Spark, MD;  Location: [facility]  ENDOSCOPY;  Service: Cardiovascular;  Laterality: N/A;   TOOTH EXTRACTION         Family History  Problem Relation Age of Onset   Hypertension Father        DOB [phone]   Hyperlipidemia Father    Diabetes Father    Coronary artery disease Father        with stents   Nephrolithiasis Father    Kidney disease Father        s/p rejected kidney transplant; on HD   Memory loss Mother        DOB 47   Kidney disease Mother        s/p nephrectomy, infection problem   Dementia Mother    Cancer Brother        lung   HIV Brother    Prostate cancer Neg Hx    Colon cancer Neg Hx     Social History   Tobacco Use   Smoking status: Passive Smoke Exposure - Never Smoker   Smokeless tobacco: Never Used  Vaping Use   Vaping Use: Never used  Substance Use Topics   Alcohol use: No   Drug use: No    Home Medications Prior to Admission medications   Medication Sig Start Date End Date Taking? Authorizing Provider  albuterol (PROVENTIL) (5 MG/ML) 0.5% nebulizer solution albuterol sulfate concentrate 2.5 mg/0.5 mL solution for nebulization  Inhale 0.5 mL as needed by nebulization route for 1 day.    [provider]  albuterol (VENTOLIN HFA) 108 (90 Base) MCG/ACT inhaler albuterol sulfate HFA 90 mcg/actuation aerosol inhaler  Inhale 2 puffs every 4 hours by inhalation route as needed.    [provider]  alfuzosin (UROXATRAL) 10 MG 24 hr tablet Take 1 tablet (10 mg total) by mouth daily with breakfast. 09/23/19   McKenzie, Candee Furbish, MD  ammonium lactate (AMLACTIN) 12 % lotion Apply 1 application topically as needed for dry skin. 03/02/20   Felipa Furnace, DPM  apixaban (ELIQUIS) 5 MG TABS tablet Take 1 tablet (5 mg total) by mouth 2 (two) times daily. 07/25/19   Daune Perch, NP  atorvastatin (LIPITOR) 80 MG tablet Take 1 tablet (80 mg total) by mouth daily. 07/20/19 07/19/20  Daune Perch, NP  azithromycin (ZITHROMAX) 250 MG tablet azithromycin 250 mg  tablet  TAKE 2 TABLETS (500 MG) BY ORAL ROUTE ONCE DAILY FOR 1 DAY THEN 1 TABLET (250 MG) BY ORAL ROUTE ONCE DAILY FOR 4 DAYS    [provider]  carvedilol (COREG) 25 MG tablet Take 1 tablet (25 mg total) by mouth 2 (two) times daily with a meal. 07/19/19 07/18/20  Daune Perch, NP  Dulaglutide (TRULICITY) 1.5 HC/6.2BJ SOPN Inject 1.5 mg into the skin once a week. 02/03/20   Shamleffer, Melanie Crazier, MD  furosemide (LASIX) 20 MG tablet Take 1 tablet (20 mg total) by mouth daily. 10/04/19   Sueanne Margarita, MD  glipiZIDE (GLUCOTROL) 5 MG tablet Take 1 tablet (5 mg total) by mouth 2 (two) times daily before a meal. 02/03/20   Shamleffer, Melanie Crazier, MD  HYDROcodone-homatropine (HYDROMET) 5-1.5 MG/5ML syrup Hydromet 5 mg-1.5 mg/5 mL oral syrup  Take 5 mL every 4 hours  by oral route.    [provider]  ipratropium (ATROVENT) 0.02 % nebulizer solution ipratropium bromide 0.02 % solution for inhalation  Inhale 2.5 mL as needed by inhalation route for 1 day.    [provider]  isosorbide-hydrALAZINE (BIDIL) 20-37.5 MG tablet Take 1 tablet by mouth 3 (three) times daily. 07/19/19 07/18/20  Daune Perch, NP  metFORMIN (GLUCOPHAGE-XR) 500 MG 24 hr tablet TAKE 1 TABLET BY MOUTH TWICE A DAY 12/29/19   Shamleffer, Melanie Crazier, MD  olmesartan-hydrochlorothiazide (BENICAR HCT) 40-12.5 MG tablet TAKE 1 TABLET BY MOUTH EVERY DAY 12/15/19   [name], MD  predniSONE (DELTASONE) 20 MG tablet Take 20 mg by mouth 2 (two) times daily. 10/25/19   [provider]    Allergies    Patient has no known allergies.  Review of Systems   Review of Systems  Constitutional: Negative.   HENT: Negative.   Respiratory: Positive for shortness of breath. Negative for apnea, cough, choking, chest tightness, wheezing and stridor.   Cardiovascular: Positive for palpitations. Negative for chest pain and leg swelling.  Gastrointestinal: Positive for abdominal pain,  constipation and nausea. Negative for abdominal distention, anal bleeding, blood in stool, diarrhea, rectal pain and vomiting.  Genitourinary: Positive for decreased urine volume and difficulty urinating. Negative for discharge, dysuria, frequency, genital sores, hematuria, penile pain, penile swelling, scrotal swelling, testicular pain and urgency.  Musculoskeletal: Negative.   Skin: Negative.   Neurological: Negative.   All other systems reviewed and are negative.   Physical Exam Updated Vital Signs BP (!) 175/129    Pulse (!) 130    Temp 99.3 F (37.4 C) (Oral)    Resp (!) 22    Ht 6\' 5"  (1.956 m)    Wt (!) 145.2 kg    SpO2 93%    BMI 37.95 kg/m   Physical Exam Vitals and nursing note reviewed.  Constitutional:      General: He is not in acute distress.    Appearance: He is well-developed. He is not ill-appearing, toxic-appearing or diaphoretic.  HENT:     Head: Normocephalic and atraumatic.     Mouth/Throat:     Mouth: Mucous membranes are moist.  Eyes:     Pupils: Pupils are equal, round, and reactive to light.  Cardiovascular:     Rate and Rhythm: Regular rhythm. Tachycardia present.     Comments: A flutter Pulmonary:     Effort: Pulmonary effort is normal. No respiratory distress.     Breath sounds: Normal breath sounds.     Comments: Speaks in full sentences without difficulty. Abdominal:     General: Bowel sounds are normal. There is no distension.     Palpations: Abdomen is soft.     Tenderness: There is abdominal tenderness in the right lower quadrant, suprapubic area and left lower quadrant. There is no right CVA tenderness, left CVA tenderness, guarding or rebound. Negative signs include Murphy's sign and McBurney's sign.     Hernia: No hernia is present.  Musculoskeletal:        General: Normal range of motion.     Cervical back: Normal range of motion and neck supple.  Skin:    General: Skin is warm and dry.     Capillary Refill: Capillary refill takes 2 to 3  seconds.  Neurological:     General: No focal deficit present.     Mental Status: He is alert and oriented to person, place, and time.     ED Results /  Procedures / Treatments   Labs (all labs ordered are listed, but only abnormal results are displayed) Labs Reviewed  COMPREHENSIVE METABOLIC PANEL - Abnormal; Notable for the following components:      Result Value   Glucose, Bld 168 (*)    Creatinine, Ser 2.35 (*)    ALT 69 (*)    GFR, Estimated 31 (*)    All other components within normal limits  URINALYSIS, ROUTINE W REFLEX MICROSCOPIC - Abnormal; Notable for the following components:   APPearance HAZY (*)    Glucose, UA 50 (*)    Hgb urine dipstick LARGE (*)    Protein, ur 100 (*)    RBC / HPF >50 (*)    Bacteria, UA RARE (*)    All other components within normal limits  URINE CULTURE  RESPIRATORY PANEL BY RT PCR (FLU A&B, COVID)  CBC WITH DIFFERENTIAL/PLATELET  TROPONIN I (HIGH SENSITIVITY)  TROPONIN I (HIGH SENSITIVITY)    EKG None  Radiology DG Abdomen Acute W/Chest  Result Date: 03/27/2020 CLINICAL DATA:  Lower abdominal pain. EXAM: DG ABDOMEN ACUTE WITH 1 VIEW CHEST COMPARISON:  09/20/2019. FINDINGS: Soft tissue structures are unremarkable. Large amount of stool noted throughout the colon. Constipation could present in this fashion. Several nonspecific air-filled loops of small bowel noted. No bowel distention or free air. Aortoiliac atherosclerotic vascular calcification. Pelvic calcifications consistent phleboliths. Degenerative change lumbar spine and both hips. Cardiomegaly. No pulmonary venous congestion. Low lung volumes. Mild right base subsegmental atelectasis. No focal infiltrate. No pleural effusion or pneumothorax. Degenerative change thoracic spine. IMPRESSION: 1. Large amount of stool noted throughout the colon. Constipation could present this fashion. No bowel distention or free air. 2. Mild right base subsegmental atelectasis. No acute cardiopulmonary  disease. Electronically Signed   By: Marcello Moores  Register   On: 03/27/2020 12:56    Procedures .Critical Care Performed by: Nettie Elm, PA-C Authorized by: Nettie Elm, PA-C   Critical care provider statement:    Critical care time (minutes):  45   Critical care was necessary to treat or prevent imminent or life-threatening deterioration of the following conditions:  Cardiac failure and renal failure   Critical care was time spent personally by me on the following activities:  Discussions with consultants, evaluation of patient's response to treatment, examination of patient, ordering and performing treatments and interventions, ordering and review of laboratory studies, ordering and review of radiographic studies, pulse oximetry, re-evaluation of patient's condition, obtaining history from patient or surrogate and review of old charts   (including critical care time)  Medications Ordered in ED Medications  diltiazem (CARDIZEM) 125 mg in dextrose 5% 125 mL (1 mg/mL) infusion (10 mg/hr Intravenous Rate/Dose Change 03/27/20 1343)  bisacodyl (DULCOLAX) EC tablet 5 mg (has no administration in time range)  diltiazem (CARDIZEM) injection 15 mg (15 mg Intravenous Given 03/27/20 1311)    ED Course  I have reviewed the triage vital signs and the nursing notes.  Pertinent labs & imaging results that were available during my care of the patient were reviewed by me and considered in my medical decision making (see chart for details).  64 year old presents for evaluation of constipation, urinary retention.  He is afebrile, nonseptic, not ill-appearing.  Not had bowel movement x1 week.  Has not been able to urinate in greater than 18 hours.  Patient with slightly distended abdomen.  Moderate tenderness to all lower quadrants.  He is significantly tachycardic in a flutter with RVR here in the ED.  Has  history of same.  He has been compliant with his medications.  States he does have some  dyspnea however also has a chronic component to it as well.  He denies any chest pain.  Plan on labs, imaging and reassess  Labs and imaging personally reviewed and interpreted:  CBC without leukocytosis Metabolic panel with some mild hyperglycemia, creatinine 2.35 up from prior at 1.9 suspect due to urinary retention. EKG without ischemic changes, he is a flutter with RVR DG chest and abdomen large amount of stool in colon.  Possible constipation.  Catheter was placed for retention.  Greater than 200 out in foley.  Patient has some relief with this.  Still has some lower abdominal pain.  We will plan on CT without contrast to assess for possible obstruction vs constipation as cause of lack of bowel movement and pain. Cardizem drip started for Aflutter with RVR.  He previously was unable to tolerate amiodarone drip due to hypotension and bradycardia requiring cessation of IV amnio during previous admission.  Needed electrical cardioversion on prior admission. Has palpitations frequently however states he is compliant with Anticoagulation.   Patient has noted to have hypoxic events however these only occur with sleeping. Upon arousal patient O2 with improvement. Likly hypoxia 2/2 know severe sleep apnea. Pending CT ABD.Will give laxative here in ED for possible constipation induced urine retention, pending CT AP.  Discussed plan with attending Dr. Alvino Chapel. He does not feel patient needs Cardioversion at this time recommends Cardizem drip.  Anticipate patient with likely admission for Aflutter with RVR, Urinary retention and AKI.  Care transfered to [facility], PA-C who will follow up on imaging and determine ultimate disposition.  Patient discussed with attending Dr. Alvino Chapel who agrees with above treatment, plan and disposition.    MDM Rules/Calculators/A&P                           Final Clinical Impression(s) / ED Diagnoses Final diagnoses:  Atrial flutter with rapid ventricular  response ([place])  Urinary retention  Constipation, unspecified constipation type  Sleep apnea, unspecified type  Hypertension, unspecified type    Rx / DC Orders ED Discharge Orders    None       Aishani Kalis A, PA-C 03/27/20 1510    Davonna Belling, MD 03/27/20 1606

## 2020-03-27 NOTE — Sedation Documentation (Signed)
Pt cardioverted @ 120J

## 2020-03-27 NOTE — Consult Note (Addendum)
Cardiology Consult:   Patient ID: Johnny [name]; MRN: [id number]; DOB: 30-Oct-1955   Admission date: 03/27/2020  Primary Care Provider: [name], MD Primary Cardiologist: Fransico Him, MD 07/19/2019 in-hospital Primary Electrophysiologist: None    Chief Complaint:  Atrial flutter, RVR  Patient Profile:   Johnny [name] is a 64 y.o. male with a history of DM, HTN, HLD, OSA on CPAP, CKD III, med non-compliance, A flutter dx 07/2019, HFrEF dx 07/2019 w/ EF 35-40%. Cards asked to see for Atrial flutter, RVR.  History of Present Illness:   Johnny [name] was admitted 03/11-03/15/2021 with CHF and rapid atrial fibrillation.  He had a TEE cardioversion on 07/18/2019. He was seen in follow up 03/22 and was in [place]. He was getting a little light-headed after am meds, but no presyncope. Wt was stable at 311 lbs. His EF was rechecked 08/2019 and was 60-65%. His CPAP was titrated last 09/2019.  However, he was unable to complete the study.  He did not follow-up with him follow-up with [place].  Not on CPAP.  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  Speak very positively about it, he wishes to pursue this.  He came to the ER today with constipation and abdominal pain. He went to [facility] and was in Atrial flutter RVR and BP high, sent to ER.   Johnny [name] has not noticed any increase or decrease in his urination, but started developing abdominal pain, bloating several days ago.  His last bowel movement was Sunday, he has not had 1 since despite taking a dose of Metamucil and eating high-fiber foods.  He has been compliant with his medications, his wife sees him take pills twice a day.  Specifically, he says he has not missed any doses of his Eliquis.  He takes his lunch to work every day, and his wife is vigilant about limiting the amount of sodium in foods.  He was on glipizide, but was gaining some weight.  His doctor decrease the glipizide and changed to Trulicity, his weight has  started to come down, albeit very slightly.  He describes PND, waking up gasping for air once or twice at night.  This has been going on for a while and has not changed recently.  This may be from OSA, not CHF.  He denies orthopnea.  Currently has no lower extremity edema.  He had some lower extremity edema at 1 point, but started being more careful about the amount of sodium in the foods that he eats and it improved.  He does not know plan he went back into the atrial flutter, thinks it may have been a while back.  However, the last set of vital signs he had was a month or 2 ago and his heart rate was normal.  In the last few days, he has noticed increased dyspnea on exertion and abdominal bloating.  This is in addition to the constipation.  Since being in the emergency room, he has had a Foley catheter inserted and has drained out 3.4 L of urine.  The abdominal pain and bloating is just about gone.  However, he still has not had a bowel movement.  No chest pain.   Past Medical History:  Diagnosis Date  . Atrial fibrillation ([place])   . CKD (chronic kidney disease) stage 3, GFR 30-59 ml/min ([place]) 06/10/2017  . Constipation 03/27/2020  . Diabetes mellitus   . Dyslipidemia 08/20/2018  . Essential hypertension 08/03/2007   Qualifier: Diagnosis of  ByLinda Hedges MD, Heinz Knuckles  Med ARB, diuretic    . Guttate psoriasis   . HTN (hypertension)   . Hyperlipidemia   . Hyperlipidemia associated with type 2 diabetes mellitus ([place]) 08/03/2007   Qualifier: Diagnosis of  By: Linda Hedges MD, Heinz Knuckles  meds - none   . 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 Sleep apnea   . Type II diabetes mellitus with renal manifestations, uncontrolled ([place]) 08/03/2007   Qualifier: Diagnosis of  By: Linda Hedges MD, Heinz Knuckles  Med metformin     Past Surgical History:  Procedure Laterality Date  . CARDIOVERSION N/A 07/18/2019   Procedure: CARDIOVERSION;  Surgeon: Dorothy Spark,  MD;  Location: [facility] ENDOSCOPY;  Service: Cardiovascular;  Laterality: N/A;  . TEE WITHOUT CARDIOVERSION N/A 07/18/2019   Procedure: TRANSESOPHAGEAL ECHOCARDIOGRAM (TEE);  Surgeon: Dorothy Spark, MD;  Location: [facility] ENDOSCOPY;  Service: Cardiovascular;  Laterality: N/A;  . TOOTH EXTRACTION       Medications Prior to Admission: Prior to Admission medications   Medication Sig Start Date End Date Taking? Authorizing Provider  albuterol (PROVENTIL) (5 MG/ML) 0.5% nebulizer solution albuterol sulfate concentrate 2.5 mg/0.5 mL solution for nebulization  Inhale 0.5 mL as needed by nebulization route for 1 day.    [provider]  albuterol (VENTOLIN HFA) 108 (90 Base) MCG/ACT inhaler albuterol sulfate HFA 90 mcg/actuation aerosol inhaler  Inhale 2 puffs every 4 hours by inhalation route as needed.    [provider]  alfuzosin (UROXATRAL) 10 MG 24 hr tablet Take 1 tablet (10 mg total) by mouth daily with breakfast. 09/23/19   McKenzie, Candee Furbish, MD  ammonium lactate (AMLACTIN) 12 % lotion Apply 1 application topically as needed for dry skin. 03/02/20   Felipa Furnace, DPM  apixaban (ELIQUIS) 5 MG TABS tablet Take 1 tablet (5 mg total) by mouth 2 (two) times daily. 07/25/19   Daune Perch, NP  atorvastatin (LIPITOR) 80 MG tablet Take 1 tablet (80 mg total) by mouth daily. 07/20/19 07/19/20  Daune Perch, NP  azithromycin (ZITHROMAX) 250 MG tablet azithromycin 250 mg tablet  TAKE 2 TABLETS (500 MG) BY ORAL ROUTE ONCE DAILY FOR 1 DAY THEN 1 TABLET (250 MG) BY ORAL ROUTE ONCE DAILY FOR 4 DAYS    [provider]  carvedilol (COREG) 25 MG tablet Take 1 tablet (25 mg total) by mouth 2 (two) times daily with a meal. 07/19/19 07/18/20  Daune Perch, NP  Dulaglutide (TRULICITY) 1.5 LK/4.4WN SOPN Inject 1.5 mg into the skin once a week. 02/03/20   Shamleffer, Melanie Crazier, MD  furosemide (LASIX) 20 MG tablet Take 1 tablet (20 mg total) by mouth daily. 10/04/19   Sueanne Margarita, MD    glipiZIDE (GLUCOTROL) 5 MG tablet Take 1 tablet (5 mg total) by mouth 2 (two) times daily before a meal. 02/03/20   Shamleffer, Melanie Crazier, MD  HYDROcodone-homatropine (HYDROMET) 5-1.5 MG/5ML syrup Hydromet 5 mg-1.5 mg/5 mL oral syrup  Take 5 mL every 4 hours by oral route.    [provider]  ipratropium (ATROVENT) 0.02 % nebulizer solution ipratropium bromide 0.02 % solution for inhalation  Inhale 2.5 mL as needed by inhalation route for 1 day.    [provider]  isosorbide-hydrALAZINE (BIDIL) 20-37.5 MG tablet Take 1 tablet by mouth 3 (three) times daily. 07/19/19 07/18/20  Daune Perch, NP  metFORMIN (GLUCOPHAGE-XR) 500 MG 24 hr tablet TAKE 1 TABLET BY MOUTH TWICE A  DAY 12/29/19   Shamleffer, Melanie Crazier, MD  olmesartan-hydrochlorothiazide (BENICAR HCT) 40-12.5 MG tablet TAKE 1 TABLET BY MOUTH EVERY DAY 12/15/19   [name], MD  predniSONE (DELTASONE) 20 MG tablet Take 20 mg by mouth 2 (two) times daily. 10/25/19   [provider]     Allergies:   No Known Allergies  Social History:   Social History   Socioeconomic History  . Marital status: Married    Spouse name: Not on file  . Number of children: 2  . Years of education: 66  . Highest education level: Not on file  Occupational History  . Occupation: mfg  Tobacco Use  . Smoking status: Passive Smoke Exposure - Never Smoker  . Smokeless tobacco: Never Used  Vaping Use  . Vaping Use: Never used  Substance and Sexual Activity  . Alcohol use: No  . Drug use: No  . Sexual activity: Yes  Other Topics Concern  . Not on file  Social History Narrative   [employer]. [employer] x 4 years. Married '94. 2 sons - '95, '96. Wife is healthy. Work - Banker.   Social Determinants of Radio broadcast assistant Strain:   . Difficulty of Paying Living Expenses: Not on file  Food Insecurity:   . Worried About Charity fundraiser in the Last Year: Not on file  . Ran Out of Food in the Last  Year: Not on file  Transportation Needs:   . Lack of Transportation (Medical): Not on file  . Lack of Transportation (Non-Medical): Not on file  Physical Activity:   . Days of Exercise per Week: Not on file  . Minutes of Exercise per Session: Not on file  Stress:   . Feeling of Stress : Not on file  Social Connections:   . Frequency of Communication with Friends and Family: Not on file  . Frequency of Social Gatherings with Friends and Family: Not on file  . Attends Religious Services: Not on file  . Active Member of Clubs or Organizations: Not on file  . Attends Archivist Meetings: Not on file  . Marital Status: Not on file  Intimate Partner Violence:   . Fear of Current or Ex-Partner: Not on file  . Emotionally Abused: Not on file  . Physically Abused: Not on file  . Sexually Abused: Not on file    Family History:  The patient's family history includes Cancer in his brother; Coronary artery disease in his father; Dementia in his mother; Diabetes in his father; HIV in his brother; Hyperlipidemia in his father; Hypertension in his father; Kidney disease in his father and mother; Memory loss in his mother; Nephrolithiasis in his father. There is no history of Prostate cancer or Colon cancer.   The patient He indicated that his mother is alive. He indicated that his father is alive. He indicated that his sister is alive. He indicated that both of his brothers are deceased. He indicated that the status of his neg hx is unknown.   ROS:  Please see the history of present illness.  All other ROS reviewed and negative.     Physical Exam/Data:   Vitals:   03/27/20 2100 03/27/20 2200 03/27/20 2305 03/27/20 2317  BP: (!) 167/101 (!) 164/108 (!) 153/81 (!) 162/104  Pulse: 88 86 80 97  Resp: (!) 29 (!) 31 19 18   Temp:   99 F (37.2 C) 98.4 F (36.9 C)  TempSrc:   Oral Oral  SpO2: 96% 96% 96% 98%  Weight:      Height:   6\' 5"  (1.956 m)     Intake/Output Summary (Last 24  hours) at 03/27/2020 2318 Last data filed at 03/27/2020 1901 Gross per 24 hour  Intake --  Output 4300 ml  Net -4300 ml   Filed Weights   03/27/20 1052 03/27/20 1056  Weight: (!) 150 kg (!) 145.2 kg   Body mass index is 37.95 kg/m.  General:  Well nourished, well developed, male in no acute distress HEENT: normal Lymph: no adenopathy Neck:  JVD elevated Endocrine:  No thryomegaly Vascular: No carotid bruits; 4/4 extremity pulses 2+ bilaterally  Cardiac:  normal S1, S2; RRR; no murmur, no rub or gallop  Lungs: Generally clear to auscultation bilaterally, no wheezing, rhonchi, few basilar rales  Abd: Firm, nontender, no hepatomegaly  Ext: No edema Musculoskeletal:  No deformities, BUE and BLE strength normal and equal Skin: warm and dry  Neuro:  CNs 2-12 intact, no focal abnormalities noted Psych:  Normal affect    EKG:  The ECG was personally reviewed: 11/23 ECG is atrial flutter, HR 131 Telemetry: atrial flutter, RVR  Relevant CV Studies:  ECHO: 08/19/2019 1. Left ventricular ejection fraction, by estimation, is 60 to 65%. The left ventricle has normal function. The left ventricle has no regional wall motion abnormalities. There is mild concentric left ventricular hypertrophy. Left ventricular diastolic  parameters are indeterminate.  2. Right ventricular systolic function is normal. The right ventricular size is mildly enlarged. There is normal pulmonary artery systolic pressure.  3. Left atrial size was moderately dilated.  4. Right atrial size was moderately dilated.  5. The mitral valve is normal in structure. No evidence of mitral valve regurgitation. No evidence of mitral stenosis.  6. The aortic valve is normal in structure. Aortic valve regurgitation is not visualized. No aortic stenosis is present.  7. Aortic dilatation noted. There is mild dilatation at the level of the  sinuses of Valsalva measuring 40 mm.  8. The inferior vena cava is normal in size  with greater than 50%  respiratory variability, suggesting right atrial pressure of 3 mmHg.   ECHO/TEE: 07/18/2019 1. Left ventricular ejection fraction, by estimation, is 25 to 30%. The left ventricle has severely decreased function. The left ventricle has no regional wall motion abnormalities. Left ventricular diastolic function could not be evaluated.  2. Right ventricular systolic function is normal. The right ventricular size is normal.  3. Left atrial size was moderately dilated. No left atrial/left atrial  appendage thrombus was detected.  4. Right atrial size was moderately dilated.  5. The mitral valve is normal in structure. Mild mitral valve  regurgitation. No evidence of mitral stenosis.  6. Tricuspid valve regurgitation is moderate.  7. The aortic valve is normal in structure. Aortic valve regurgitation is  not visualized. No aortic stenosis is present.  8. The inferior vena cava is normal in size with greater than 50%  respiratory variability, suggesting right atrial pressure of 3 mmHg.   Conclusion(s)/Recommendation(s): No LA/LAA thrombus identified. Successful  cardioversion performed with restoration of normal sinus rhythm.   Laboratory Data:  Chemistry Recent Labs  Lab 03/27/20 1105  NA 142  K 4.0  CL 106  CO2 23  GLUCOSE 168*  BUN 22  CREATININE 2.35*  CALCIUM 9.4  GFRNONAA 31*  ANIONGAP 13    Recent Labs  Lab 03/27/20 1105  PROT 7.3  ALBUMIN 4.1  AST 32  ALT 69*  ALKPHOS 96  BILITOT 0.9   Hematology Recent Labs  Lab 03/27/20 1105  WBC 8.4  RBC 5.19  HGB 14.5  HCT 46.8  MCV 90.2  MCH 27.9  MCHC 31.0  RDW 14.6  PLT 246   Cardiac Enzymes  High Sensitivity Troponin:   Recent Labs  Lab 03/27/20 1105 03/27/20 1538  TROPONINIHS 15 11     BNPNo results for input(s): BNP, PROBNP in the last 168 hours.  DDimer No results for input(s): DDIMER in the last 168 hours. Lipids:  Lab Results  Component Value Date   CHOL 123  09/05/2019   HDL 31 (L) 09/05/2019   LDLCALC 79 09/05/2019   LDLDIRECT 170.2 11/19/2012   TRIG 63 09/05/2019   CHOLHDL 4.0 09/05/2019   INR:  Lab Results  Component Value Date   INR 1.1 07/18/2019   INR 1.0 07/14/2019   A1c:  Lab Results  Component Value Date   HGBA1C 6.9 (A) 02/03/2020   Thyroid:  Lab Results  Component Value Date   TSH 1.579 07/14/2019    Radiology/Studies:  CT Abdomen Pelvis Wo Contrast  Result Date: 03/27/2020 CLINICAL DATA:  Abdomen pain with constipation EXAM: CT ABDOMEN AND PELVIS WITHOUT CONTRAST TECHNIQUE: Multidetector CT imaging of the abdomen and pelvis was performed following the standard protocol without IV contrast. COMPARISON:  03/27/2020 FINDINGS: Lower chest: Lung bases demonstrate no acute consolidation or effusion. Hazy right subpleural density probably reflects atelectasis. Hepatobiliary: No focal liver abnormality is seen. No gallstones, gallbladder wall thickening, or biliary dilatation. Pancreas: Unremarkable. No pancreatic ductal dilatation or surrounding inflammatory changes. Spleen: Normal in size without focal abnormality. Adrenals/Urinary Tract: Nodular thickening of adrenal glands. 3.3 cm hypodense right adrenal nodule. 2.8 cm hypodense left adrenal nodule. Kidneys show mild renal pelvis enlargement and hydroureter but no stones. Moderate perinephric edema and fluid, nonspecific. Bladder is decompressed by Foley catheter. Stomach/Bowel: The stomach is nonenlarged. No dilated small bowel. Negative appendix. Large amount of stool in the colon without wall thickening. Vascular/Lymphatic: Moderate aortic atherosclerosis. No aneurysm. No suspicious nodes. Reproductive: Markedly enlarged prostate gland. Other: No free air. Bilateral small inguinal hernias containing fat on the left and fat and small fluid on the right. Musculoskeletal: No acute or significant osseous findings. IMPRESSION: 1. Large amount of stool in the colon consistent with  constipation. No evidence for bowel obstruction. 2. Markedly enlarged prostate gland. 3. Bilateral hypodense adrenal nodules, probably represent adenomas. 4. Nonspecific moderate perinephric stranding and fluid. Mild renal pelvis dilatation and hydroureter bilaterally but no obstructing stone disease. Aortic Atherosclerosis (ICD10-I70.0). Electronically Signed   By: Donavan Foil M.D.   On: 03/27/2020 18:04   DG Abdomen Acute W/Chest  Result Date: 03/27/2020 CLINICAL DATA:  Lower abdominal pain. EXAM: DG ABDOMEN ACUTE WITH 1 VIEW CHEST COMPARISON:  09/20/2019. FINDINGS: Soft tissue structures are unremarkable. Large amount of stool noted throughout the colon. Constipation could present in this fashion. Several nonspecific air-filled loops of small bowel noted. No bowel distention or free air. Aortoiliac atherosclerotic vascular calcification. Pelvic calcifications consistent phleboliths. Degenerative change lumbar spine and both hips. Cardiomegaly. No pulmonary venous congestion. Low lung volumes. Mild right base subsegmental atelectasis. No focal infiltrate. No pleural effusion or pneumothorax. Degenerative change thoracic spine. IMPRESSION: 1. Large amount of stool noted throughout the colon. Constipation could present this fashion. No bowel distention or free air. 2. Mild right base subsegmental atelectasis. No acute cardiopulmonary disease. Electronically Signed   By: Marcello Moores  Register   On: 03/27/2020 12:56  Assessment and Plan:   Principal Problem:   Constipation Active Problems:   Essential hypertension   Type 2 diabetes mellitus with diabetic polyneuropathy, without long-term current use of insulin (HCC)   Atrial flutter with rapid ventricular response (HCC)   Urinary retention  1.  Atrial flutter, RVR -Duration unknown, likely greater than 72 hours -According to the patient, he has not missed any doses of his Eliquis.  Wife agrees that he is taking meds consistently twice daily,  although she cannot say exactly which ones. -I feel the rapid atrial flutter is causing the shortness of breath -Feel he will benefit from cardioversion, if MD agrees, this can be done in the ER without a TEE.  2.  Urinary retention - improved w/ foley insertion -Management per IM/ER MD  3.  Constipation: -Not resolved despite the patient eating foods such as broccoli and spinach and apples which generally help him resolve the constipation -He also took Metamucil which did not work - Mudlogger per IM/ER MD   For questions or updates, please contact [place] Please consult [email or web address] for contact info under Cardiology/STEMI.    Signed, Johnny Ferries, PA-C  03/27/2020 5:12 PM    ATTENDING ATTESTATION  I have seen, examined and evaluated the patient this PM in the ER along with Johnny Ferries, PA-C.  After reviewing all the available data and chart, we discussed the patients laboratory, study & physical findings as well as symptoms in detail. I agree with her findings, examination as well as impression recommendations as per our discussion.    Interesting circumstance with a gentleman who has multiple cardiac risk factors noted along with documented atrial flutter along with previous cardiomyopathy (EF 35 to 40%-not improved 60-65%).  He presented here with initial complaints of multiple days of constipation and abdominal pain followed now by decreased urine output/sensation of urination.  He has been trying to treat this most notably his constipation/obstipation with Metamucil and high-fiber foods, but has not had any luck.  He has noted that he started getting more short of breath of the last couple days and has had worsening edema.  He has not had any chest pain or pressure with rest or exertion he has had exertional dyspnea but no orthopnea and PND.  On presentation here he was in atrial flutter with rate in the 130s.  He is not in extremis, but clearly is more dyspneic being  in the atrial flutter.  Foley catheter placed drained over 2 L of urine, and I saw him shortly after getting a CT of the abdomen pelvis.  Results still pending.  At this point I think atrial flutter is certainly contributing to his dyspnea, not the primary issue.  Is a side issue as a reaction I spent off of the presenting combination of constipation and decreased urine output.  I agree with Johnny Ferries, PA that he did he would benefit from cardioversion the emergency room.  We discussed this with the ER physicians and they will proceed.  He has not missed any Eliquis based on prolonged conversation.  He understands that if he was not truthful with [place] that this could increase risk of stroke.  I expect he will probably be admitted to medicine service and we will be happy to follow along in a consultative role.    Continue carvedilol and Eliquis. BP control, continue BiDil and on olmesartan-HCTZ.   Glenetta Hew, M.D., M.S. Interventional Cardiologist   Pager # [phone] Phone # [phone]  [place], [place] [address]

## 2020-03-27 NOTE — H&P (Signed)
History and Physical  Johnny [name] URK:[id number] DOB: 12-19-1955 DOA: 03/27/2020  Referring physician: Veryl Speak, MD PCP: [name], MD  Patient coming from: Home  Chief Complaint: Constipation/abdominal pain  HPI: Johnny [name] is a 64 y.o. male with medical history significant for rapid atrial flutter, HFrEF, hypertension, CKD 3, hypertension, hyperlipidemia, type 2 diabetes mellitus, BPH, OSA not on CPAP and obesity who presents to the emergency department due to 3-day onset of constipation (last bowel movement was on Sunday-11/21), he states that he took Metamucil and high-fiber foods without improvement.  He also complained of 1 day of difficulty in urination.  He denies fever, chills, chest pain, shortness of breath, nausea or vomiting.  He went to an urgent care and was noted to be in atrial flutter with RVR and elevated blood pressure, so he was sent to the emergency department for further evaluation and management.    ED Course:  In the emergency department, he was tachycardic and tachypneic on arrival.  BP was 186/126. He was started on IV Cardizem drip due to atrial flutter with RVR without any response, cardiology was consulted, patient was cardioverted to sinus rhythm using 120 J of electricity after being sedated with 140 mg of propofol.  Foley catheter was inserted due to urinary retention and 3.4 L of urine was immediately drained, patient also endorsed improved abdominal discomfort.  Work-up in the ED showed normal CBC, BUN to creatinine 22/2.35 (baseline creatinine 1.9-2.0) and hyperglycemia.  ALT 69.  CT abdomen and pelvis without contrast showed large amount of stool in the colon consistent with constipation and with no evidence for bowel obstruction.  Markedly enlarged prostate gland noted.  Hospitalist was asked to admit.  For further evaluation and management.  Review of Systems: Constitutional: Negative for chills and fever.  HENT: Negative for ear pain  and sore throat.   Eyes: Negative for pain and visual disturbance.  Respiratory: Negative for cough, chest tightness and shortness of breath.   Cardiovascular: Negative for chest pain and palpitations.  Gastrointestinal: Positive for for abdominal bloating and pain.  Negative for vomiting.  Endocrine: Negative for polyphagia and polyuria.  Genitourinary: Negative for decreased urine volume, dysuria, enuresis Musculoskeletal: Negative for arthralgias and back pain.  Skin: Negative for color change and rash.  Allergic/Immunologic: Negative for immunocompromised state.  Neurological: Negative for tremors, syncope, speech difficulty, weakness, light-headedness and headaches.  Hematological: Does not bruise/bleed easily.  All other systems reviewed and are negative    Past Medical History:  Diagnosis Date  . Atrial fibrillation ([place])   . CKD (chronic kidney disease) stage 3, GFR 30-59 ml/min ([place]) 06/10/2017  . Constipation 03/27/2020  . Diabetes mellitus   . Dyslipidemia 08/20/2018  . Essential hypertension 08/03/2007   Qualifier: Diagnosis of  By: Linda Hedges MD, Heinz Knuckles  Med ARB, diuretic    . Guttate psoriasis   . HTN (hypertension)   . Hyperlipidemia   . Hyperlipidemia associated with type 2 diabetes mellitus ([place]) 08/03/2007   Qualifier: Diagnosis of  By: Linda Hedges MD, Heinz Knuckles  meds - none   . 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 Sleep apnea   . Type II diabetes mellitus with renal manifestations, uncontrolled ([place]) 08/03/2007   Qualifier: Diagnosis of  By: Linda Hedges MD, Heinz Knuckles  Med metformin    Past Surgical History:  Procedure Laterality Date  . CARDIOVERSION N/A 07/18/2019   Procedure: CARDIOVERSION;  Surgeon: Ena Dawley  H, MD;  Location: [place];  Service: Cardiovascular;  Laterality: N/A;  . TEE WITHOUT CARDIOVERSION N/A 07/18/2019   Procedure: TRANSESOPHAGEAL ECHOCARDIOGRAM (TEE);  Surgeon: Dorothy Spark, MD;   Location: [place];  Service: Cardiovascular;  Laterality: N/A;  . TOOTH EXTRACTION      Social History:  reports that he is a non-smoker but has been exposed to tobacco smoke. He has never used smokeless tobacco. He reports that he does not drink alcohol and does not use drugs.   No Known Allergies  Family History  Problem Relation Age of Onset  . Hypertension Father        DOB [birth year]  . Hyperlipidemia Father   . Diabetes Father   . Coronary artery disease Father        with stents  . Nephrolithiasis Father   . Kidney disease Father        s/p rejected kidney transplant; on HD  . Memory loss Mother        DOB 84  . Kidney disease Mother        s/p nephrectomy, infection problem  . Dementia Mother   . Cancer Brother        lung  . HIV Brother   . Prostate cancer Neg Hx   . Colon cancer Neg Hx      Prior to Admission medications   Medication Sig Start Date End Date Taking? Authorizing Provider  albuterol (PROVENTIL) (5 MG/ML) 0.5% nebulizer solution albuterol sulfate concentrate 2.5 mg/0.5 mL solution for nebulization  Inhale 0.5 mL as needed by nebulization route for 1 day.    [provider]  albuterol (VENTOLIN HFA) 108 (90 Base) MCG/ACT inhaler albuterol sulfate HFA 90 mcg/actuation aerosol inhaler  Inhale 2 puffs every 4 hours by inhalation route as needed.    [provider]  alfuzosin (UROXATRAL) 10 MG 24 hr tablet Take 1 tablet (10 mg total) by mouth daily with breakfast. 09/23/19   McKenzie, Candee Furbish, MD  ammonium lactate (AMLACTIN) 12 % lotion Apply 1 application topically as needed for dry skin. 03/02/20   Felipa Furnace, DPM  apixaban (ELIQUIS) 5 MG TABS tablet Take 1 tablet (5 mg total) by mouth 2 (two) times daily. 07/25/19   Daune Perch, NP  atorvastatin (LIPITOR) 80 MG tablet Take 1 tablet (80 mg total) by mouth daily. 07/20/19 07/19/20  Daune Perch, NP  azithromycin (ZITHROMAX) 250 MG tablet azithromycin 250 mg tablet  TAKE 2  TABLETS (500 MG) BY ORAL ROUTE ONCE DAILY FOR 1 DAY THEN 1 TABLET (250 MG) BY ORAL ROUTE ONCE DAILY FOR 4 DAYS    [provider]  carvedilol (COREG) 25 MG tablet Take 1 tablet (25 mg total) by mouth 2 (two) times daily with a meal. 07/19/19 07/18/20  Daune Perch, NP  Dulaglutide (TRULICITY) 1.5 YK/9.9IP SOPN Inject 1.5 mg into the skin once a week. 02/03/20   Shamleffer, Melanie Crazier, MD  furosemide (LASIX) 20 MG tablet Take 1 tablet (20 mg total) by mouth daily. 10/04/19   Sueanne Margarita, MD  glipiZIDE (GLUCOTROL) 5 MG tablet Take 1 tablet (5 mg total) by mouth 2 (two) times daily before a meal. 02/03/20   Shamleffer, Melanie Crazier, MD  HYDROcodone-homatropine (HYDROMET) 5-1.5 MG/5ML syrup Hydromet 5 mg-1.5 mg/5 mL oral syrup  Take 5 mL every 4 hours by oral route.    [provider]  ipratropium (ATROVENT) 0.02 % nebulizer solution ipratropium bromide 0.02 % solution for inhalation  Inhale 2.5 mL  as needed by inhalation route for 1 day.    [provider]  isosorbide-hydrALAZINE (BIDIL) 20-37.5 MG tablet Take 1 tablet by mouth 3 (three) times daily. 07/19/19 07/18/20  Daune Perch, NP  metFORMIN (GLUCOPHAGE-XR) 500 MG 24 hr tablet TAKE 1 TABLET BY MOUTH TWICE A DAY 12/29/19   Shamleffer, Melanie Crazier, MD  olmesartan-hydrochlorothiazide (BENICAR HCT) 40-12.5 MG tablet TAKE 1 TABLET BY MOUTH EVERY DAY 12/15/19   [name], MD  predniSONE (DELTASONE) 20 MG tablet Take 20 mg by mouth 2 (two) times daily. 10/25/19   [provider]    Physical Exam: BP (!) 162/104 (BP Location: Left Arm)   Pulse 97   Temp 98.4 F (36.9 C) (Oral)   Resp 18   Ht 6\' 5"  (1.956 m)   Wt (!) 141.7 kg   SpO2 98%   BMI 37.03 kg/m   . General: 64 y.o. year-old male well developed well nourished in no acute distress.  Alert and oriented x3. Marland Kitchen HEENT: NCAT, EOMI . Neck: Supple, trachea media . Cardiovascular: Regular rate and rhythm with no rubs or gallops.  No  thyromegaly or JVD noted.  No lower extremity edema. 2/4 pulses in all 4 extremities. Marland Kitchen Respiratory: Clear to auscultation with no wheezes or rales. Good inspiratory effort. . Abdomen: Soft nontender nondistended with normal bowel sounds x4 quadrants. . Muskuloskeletal: No cyanosis, clubbing or edema noted bilaterally . Neuro: CN II-XII intact, strength, sensation, reflexes . Skin: Psoriatic rash noted on upper extremities.  No ulcerative lesions noted . Psychiatry: Judgement and insight appear normal. Mood is appropriate for condition and setting          Labs on Admission:  Basic Metabolic Panel: Recent Labs  Lab 03/27/20 1105  NA 142  K 4.0  CL 106  CO2 23  GLUCOSE 168*  BUN 22  CREATININE 2.35*  CALCIUM 9.4   Liver Function Tests: Recent Labs  Lab 03/27/20 1105  AST 32  ALT 69*  ALKPHOS 96  BILITOT 0.9  PROT 7.3  ALBUMIN 4.1   No results for input(s): LIPASE, AMYLASE in the last 168 hours. No results for input(s): AMMONIA in the last 168 hours. CBC: Recent Labs  Lab 03/27/20 1105  WBC 8.4  NEUTROABS 7.1  HGB 14.5  HCT 46.8  MCV 90.2  PLT 246   Cardiac Enzymes: No results for input(s): CKTOTAL, CKMB, CKMBINDEX, TROPONINI in the last 168 hours.  BNP (last 3 results) No results for input(s): BNP in the last 8760 hours.  ProBNP (last 3 results) No results for input(s): PROBNP in the last 8760 hours.  CBG: No results for input(s): GLUCAP in the last 168 hours.  Radiological Exams on Admission: CT Abdomen Pelvis Wo Contrast  Result Date: 03/27/2020 CLINICAL DATA:  Abdomen pain with constipation EXAM: CT ABDOMEN AND PELVIS WITHOUT CONTRAST TECHNIQUE: Multidetector CT imaging of the abdomen and pelvis was performed following the standard protocol without IV contrast. COMPARISON:  03/27/2020 FINDINGS: Lower chest: Lung bases demonstrate no acute consolidation or effusion. Hazy right subpleural density probably reflects atelectasis. Hepatobiliary: No focal  liver abnormality is seen. No gallstones, gallbladder wall thickening, or biliary dilatation. Pancreas: Unremarkable. No pancreatic ductal dilatation or surrounding inflammatory changes. Spleen: Normal in size without focal abnormality. Adrenals/Urinary Tract: Nodular thickening of adrenal glands. 3.3 cm hypodense right adrenal nodule. 2.8 cm hypodense left adrenal nodule. Kidneys show mild renal pelvis enlargement and hydroureter but no stones. Moderate perinephric edema and fluid, nonspecific. Bladder is decompressed by Foley  catheter. Stomach/Bowel: The stomach is nonenlarged. No dilated small bowel. Negative appendix. Large amount of stool in the colon without wall thickening. Vascular/Lymphatic: Moderate aortic atherosclerosis. No aneurysm. No suspicious nodes. Reproductive: Markedly enlarged prostate gland. Other: No free air. Bilateral small inguinal hernias containing fat on the left and fat and small fluid on the right. Musculoskeletal: No acute or significant osseous findings. IMPRESSION: 1. Large amount of stool in the colon consistent with constipation. No evidence for bowel obstruction. 2. Markedly enlarged prostate gland. 3. Bilateral hypodense adrenal nodules, probably represent adenomas. 4. Nonspecific moderate perinephric stranding and fluid. Mild renal pelvis dilatation and hydroureter bilaterally but no obstructing stone disease. Aortic Atherosclerosis (ICD10-I70.0). Electronically Signed   By: Donavan Foil M.D.   On: 03/27/2020 18:04   DG Abdomen Acute W/Chest  Result Date: 03/27/2020 CLINICAL DATA:  Lower abdominal pain. EXAM: DG ABDOMEN ACUTE WITH 1 VIEW CHEST COMPARISON:  09/20/2019. FINDINGS: Soft tissue structures are unremarkable. Large amount of stool noted throughout the colon. Constipation could present in this fashion. Several nonspecific air-filled loops of small bowel noted. No bowel distention or free air. Aortoiliac atherosclerotic vascular calcification. Pelvic calcifications  consistent phleboliths. Degenerative change lumbar spine and both hips. Cardiomegaly. No pulmonary venous congestion. Low lung volumes. Mild right base subsegmental atelectasis. No focal infiltrate. No pleural effusion or pneumothorax. Degenerative change thoracic spine. IMPRESSION: 1. Large amount of stool noted throughout the colon. Constipation could present this fashion. No bowel distention or free air. 2. Mild right base subsegmental atelectasis. No acute cardiopulmonary disease. Electronically Signed   By: Marcello Moores  Register   On: 03/27/2020 12:56    EKG: I independently viewed the EKG done and my findings are as followed: Normal sinus rhythm at a rate of 85 bpm with nonspecific T wave abnormalities in lateral leads  Assessment/Plan Present on Admission: . Urinary retention . Essential hypertension . Atrial flutter with rapid ventricular response ([place]) . Constipation . Dyslipidemia . Benign prostatic hyperplasia with urinary obstruction . Type 2 diabetes mellitus with hyperglycemia, without long-term current use of insulin ([facility])  Principal Problem:   Constipation Active Problems:   Essential hypertension   Dyslipidemia   Type 2 diabetes mellitus with hyperglycemia, without long-term current use of insulin ([facility])   Atrial flutter with rapid ventricular response ([facility])   Urinary retention   Benign prostatic hyperplasia with urinary obstruction   Adrenal nodule ([facility])   OSA (obstructive sleep apnea)   Obesity (BMI 30-39.9)   Acute kidney injury superimposed on CKD ([facility])   Atrial flutter with RVR S/P cardioversion Patient was successfully cardioverted to a normal sinus rhythm using 120 J of electricity EKG showed normal sinus rhythm at a rate of 85 bpm Continue Eliquis and Coreg Continue telemetry  BPH with urinary retention 3.4 L of urine was drained on insertion of Foley catheter Mildly enlarged prostate gland was noted on CT abdomen and pelvis Patient presented with urinary  retention on 5/[facility] at [facility] ED Urology will be consulted for further evaluation and recommendation  Constipation CT abdomen and pelvis without contrast showed large amount of stool in the colon consistent with constipation and with no evidence for bowel obstruction Enema x1 will be given; Continue MiraLAX    Acute kidney injury on CKD stage IIIb BUN to creatinine 22/2.35 (baseline creatinine 1.9-2.0)  Renally adjust medications, avoid nephrotoxic agents/dehydration/hypotension  Hyperglycemia secondary to type 2 diabetes mellitus Continue insulin sliding scale and hypoglycemia protocol  Isolated elevated ALT ALT 69, continue to monitor liver enzymes  Adrenal  nodule CT abdomen and pelvis showed Bilateral hypodense adrenal nodules, probably represent adenomas Continue follow-up with outpatient PCP  Essential hypertension Continue Coreg, Bidil, Benicar  History of HFrEF Echo done on 08/19/2019 showed  Left ventricular ejection fraction, by estimation, is 60 to 65%. The left ventricle has normal function. The left ventricle has no regional wall motion abnormalities. There is mild concentric left ventricular hypertrophy Echocardiogram done on 07/18/2019 showed Left ventricular ejection fraction, by estimation, is 25 to 30%. The left ventricle has severely decreased function Continue Coreg, Bidil, Benicar, Lipitor  Dyslipidemia Continue Lipitor  OSA not on CPAP Patient states that he will follow up on further studies regarding need for CPAP  Medication noncompliance Patient states that he has been compliant with his medications and diet  Obesity (BMI 37.03) Patient counseled on diet and lifestyle modification  DVT prophylaxis: Eliquis  Code Status: Full code  Family Communication: None at bedside  Disposition Plan:  Patient is from:                        home Anticipated DC to:                   SNF or family members home Anticipated DC date:               2-3 days Anticipated  DC barriers:           Patient is unstable to be discharged home at this time due to atrial flutter just cardioverted to NSR, BPH with urinary retention and constipation requiring treatment.   Consults called: Urology, cardiology  Admission status: Observation    Bernadette Hoit MD [facility]  03/28/2020, 1:00 AM

## 2020-03-27 NOTE — ED Provider Notes (Signed)
Care assumed from Dr. Alvino Chapel at shift change.  Patient being evaluated by cardiology for possible admission.  Patient is in atrial flutter with rapid ventricular response uncontrolled with Cardizem.  Cardiology has evaluated the patient and is recommending cardioversion.  After written and verbal consent were obtained, patient was successfully cardioverted to a sinus rhythm using 120 J of electricity.  This was performed under conscious sedation, a total of 140 mg of propofol was used with good results.  Patient tolerated the procedure well and is waking up and back to baseline.  CRITICAL CARE Performed by: Veryl Speak Total critical care time: 35 minutes Critical care time was exclusive of separately billable procedures and treating other patients. Critical care was necessary to treat or prevent imminent or life-threatening deterioration. Critical care was time spent personally by me on the following activities: development of treatment plan with patient and/or surrogate as well as nursing, discussions with consultants, evaluation of patient's response to treatment, examination of patient, obtaining history from patient or surrogate, ordering and performing treatments and interventions, ordering and review of laboratory studies, ordering and review of radiographic studies, pulse oximetry and re-evaluation of patient's condition.    Veryl Speak, MD 03/27/20 1836

## 2020-03-27 NOTE — ED Notes (Signed)
Transport requested

## 2020-03-28 ENCOUNTER — Other Ambulatory Visit: Payer: Self-pay | Admitting: Internal Medicine

## 2020-03-28 ENCOUNTER — Encounter (HOSPITAL_COMMUNITY): Payer: Self-pay | Admitting: Emergency Medicine

## 2020-03-28 ENCOUNTER — Other Ambulatory Visit: Payer: Self-pay

## 2020-03-28 ENCOUNTER — Emergency Department (HOSPITAL_COMMUNITY)
Admission: EM | Admit: 2020-03-28 | Discharge: 2020-03-28 | Disposition: A | Discharge status: Home / Self Care | Payer: BC Managed Care – PPO | Source: Home / Self Care | Attending: Emergency Medicine | Admitting: Emergency Medicine

## 2020-03-28 DIAGNOSIS — I5022 Chronic systolic (congestive) heart failure: Secondary | ICD-10-CM

## 2020-03-28 DIAGNOSIS — Z7722 Contact with and (suspected) exposure to environmental tobacco smoke (acute) (chronic): Secondary | ICD-10-CM | POA: Insufficient documentation

## 2020-03-28 DIAGNOSIS — I5043 Acute on chronic combined systolic (congestive) and diastolic (congestive) heart failure: Secondary | ICD-10-CM | POA: Insufficient documentation

## 2020-03-28 DIAGNOSIS — Z7984 Long term (current) use of oral hypoglycemic drugs: Secondary | ICD-10-CM | POA: Insufficient documentation

## 2020-03-28 DIAGNOSIS — E1122 Type 2 diabetes mellitus with diabetic chronic kidney disease: Secondary | ICD-10-CM | POA: Insufficient documentation

## 2020-03-28 DIAGNOSIS — R1030 Lower abdominal pain, unspecified: Secondary | ICD-10-CM | POA: Insufficient documentation

## 2020-03-28 DIAGNOSIS — E278 Other specified disorders of adrenal gland: Secondary | ICD-10-CM

## 2020-03-28 DIAGNOSIS — Z7901 Long term (current) use of anticoagulants: Secondary | ICD-10-CM | POA: Insufficient documentation

## 2020-03-28 DIAGNOSIS — E669 Obesity, unspecified: Secondary | ICD-10-CM

## 2020-03-28 DIAGNOSIS — R419 Unspecified symptoms and signs involving cognitive functions and awareness: Secondary | ICD-10-CM

## 2020-03-28 DIAGNOSIS — N184 Chronic kidney disease, stage 4 (severe): Secondary | ICD-10-CM | POA: Insufficient documentation

## 2020-03-28 DIAGNOSIS — Z79899 Other long term (current) drug therapy: Secondary | ICD-10-CM | POA: Insufficient documentation

## 2020-03-28 DIAGNOSIS — I13 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 1 through stage 4 chronic kidney disease, or unspecified chronic kidney disease: Secondary | ICD-10-CM | POA: Insufficient documentation

## 2020-03-28 DIAGNOSIS — G4733 Obstructive sleep apnea (adult) (pediatric): Secondary | ICD-10-CM

## 2020-03-28 DIAGNOSIS — R339 Retention of urine, unspecified: Secondary | ICD-10-CM

## 2020-03-28 DIAGNOSIS — N1832 Chronic kidney disease, stage 3b: Secondary | ICD-10-CM | POA: Insufficient documentation

## 2020-03-28 DIAGNOSIS — Z9114 Patient's other noncompliance with medication regimen: Secondary | ICD-10-CM

## 2020-03-28 DIAGNOSIS — I4891 Unspecified atrial fibrillation: Secondary | ICD-10-CM | POA: Insufficient documentation

## 2020-03-28 DIAGNOSIS — N179 Acute kidney failure, unspecified: Secondary | ICD-10-CM | POA: Insufficient documentation

## 2020-03-28 DIAGNOSIS — I4892 Unspecified atrial flutter: Secondary | ICD-10-CM | POA: Diagnosis not present

## 2020-03-28 LAB — URINALYSIS, ROUTINE W REFLEX MICROSCOPIC
Bacteria, UA: NONE SEEN
Bilirubin Urine: NEGATIVE
Glucose, UA: NEGATIVE mg/dL
Ketones, ur: NEGATIVE mg/dL
Leukocytes,Ua: NEGATIVE
Nitrite: NEGATIVE
Protein, ur: 100 mg/dL — AB
RBC / HPF: >50 RBC/hpf — ABNORMAL HIGH (ref 0–5)
Specific Gravity, Urine: 1.016 (ref 1.005–1.030)
pH: 5 (ref 5.0–8.0)

## 2020-03-28 LAB — CBC
HCT: 42.4 % (ref 39.0–52.0)
Hemoglobin: 13.7 g/dL (ref 13.0–17.0)
MCH: 28.3 pg (ref 26.0–34.0)
MCHC: 32.3 g/dL (ref 30.0–36.0)
MCV: 87.6 fL (ref 80.0–100.0)
Platelets: 235 10*3/uL (ref 150–400)
RBC: 4.84 MIL/uL (ref 4.22–5.81)
RDW: 14.9 % (ref 11.5–15.5)
WBC: 6.6 10*3/uL (ref 4.0–10.5)
nRBC: 0 % (ref 0.0–0.2)

## 2020-03-28 LAB — COMPREHENSIVE METABOLIC PANEL
ALT: 57 U/L — ABNORMAL HIGH (ref 0–44)
AST: 23 U/L (ref 15–41)
Albumin: 3.6 g/dL (ref 3.5–5.0)
Alkaline Phosphatase: 83 U/L (ref 38–126)
Anion gap: 13 (ref 5–15)
BUN: 24 mg/dL — ABNORMAL HIGH (ref 8–23)
CO2: 24 mmol/L (ref 22–32)
Calcium: 9.4 mg/dL (ref 8.9–10.3)
Chloride: 107 mmol/L (ref 98–111)
Creatinine, Ser: 1.97 mg/dL — ABNORMAL HIGH (ref 0.61–1.24)
GFR, Estimated: 38 mL/min — ABNORMAL LOW (ref >60)
Glucose, Bld: 127 mg/dL — ABNORMAL HIGH (ref 70–99)
Potassium: 3.2 mmol/L — ABNORMAL LOW (ref 3.5–5.1)
Sodium: 144 mmol/L (ref 135–145)
Total Bilirubin: 1.2 mg/dL (ref 0.3–1.2)
Total Protein: 6.6 g/dL (ref 6.5–8.1)

## 2020-03-28 LAB — PROTIME-INR
INR: 1.2 (ref 0.8–1.2)
Prothrombin Time: 14.5 seconds (ref 11.4–15.2)

## 2020-03-28 LAB — MAGNESIUM: Magnesium: 2 mg/dL (ref 1.7–2.4)

## 2020-03-28 LAB — PHOSPHORUS: Phosphorus: 4.6 mg/dL (ref 2.5–4.6)

## 2020-03-28 LAB — URINE CULTURE: Culture: NO GROWTH

## 2020-03-28 LAB — GLUCOSE, CAPILLARY
Glucose-Capillary: 117 mg/dL — ABNORMAL HIGH (ref 70–99)
Glucose-Capillary: 122 mg/dL — ABNORMAL HIGH (ref 70–99)
Glucose-Capillary: 140 mg/dL — ABNORMAL HIGH (ref 70–99)

## 2020-03-28 LAB — APTT: aPTT: 34 seconds (ref 24–36)

## 2020-03-28 MED ORDER — INSULIN ASPART 100 UNIT/ML ~~LOC~~ SOLN: 0.0000 [IU] | Freq: Every day | SUBCUTANEOUS | Status: DC

## 2020-03-28 MED ORDER — ALBUTEROL SULFATE HFA 108 (90 BASE) MCG/ACT IN AERS: 2.0000 | INHALATION_SPRAY | RESPIRATORY_TRACT | Status: DC | PRN

## 2020-03-28 MED ORDER — ATORVASTATIN CALCIUM 80 MG PO TABS
80.0000 mg | ORAL_TABLET | Freq: Every day | ORAL | Status: DC
  Administered 2020-03-28: 80 mg via ORAL
  Filled 2020-03-28: qty 1

## 2020-03-28 MED ORDER — CARVEDILOL 25 MG PO TABS
25.0000 mg | ORAL_TABLET | Freq: Two times a day (BID) | ORAL | Status: DC
  Administered 2020-03-28: 25 mg via ORAL
  Filled 2020-03-28: qty 1

## 2020-03-28 MED ORDER — MINERAL OIL RE ENEM
1.0000 | ENEMA | Freq: Once | RECTAL | Status: AC
  Administered 2020-03-28: 1 via RECTAL
  Filled 2020-03-28: qty 1

## 2020-03-28 MED ORDER — IPRATROPIUM BROMIDE 0.02 % IN SOLN: 0.2500 mg | Freq: Four times a day (QID) | RESPIRATORY_TRACT | Status: DC | PRN

## 2020-03-28 MED ORDER — APIXABAN 5 MG PO TABS
5.0000 mg | ORAL_TABLET | Freq: Two times a day (BID) | ORAL | Status: DC
  Administered 2020-03-28: 5 mg via ORAL
  Filled 2020-03-28: qty 1

## 2020-03-28 MED ORDER — INSULIN ASPART 100 UNIT/ML ~~LOC~~ SOLN
0.0000 [IU] | Freq: Three times a day (TID) | SUBCUTANEOUS | Status: DC
  Administered 2020-03-28 (×2): 1 [IU] via SUBCUTANEOUS

## 2020-03-28 MED ORDER — POLYETHYLENE GLYCOL 3350 17 G PO PACK
17.0000 g | PACK | Freq: Every day | ORAL | Status: DC
  Administered 2020-03-28: 17 g via ORAL
  Filled 2020-03-28: qty 1

## 2020-03-28 MED ORDER — CHLORHEXIDINE GLUCONATE CLOTH 2 % EX PADS: 6.0000 | MEDICATED_PAD | Freq: Every day | CUTANEOUS | Status: DC

## 2020-03-28 MED ORDER — ISOSORB DINITRATE-HYDRALAZINE 20-37.5 MG PO TABS
1.0000 | ORAL_TABLET | Freq: Three times a day (TID) | ORAL | Status: DC
  Administered 2020-03-28: 1 via ORAL
  Filled 2020-03-28: qty 1

## 2020-03-28 NOTE — Progress Notes (Signed)
Progress Note  Patient Name: Johnny [name] Date of Encounter: 03/28/2020  [facility] Cardiologist: Fransico Him, MD   Subjective   Feeling well this morning.   Inpatient Medications    Scheduled Meds: . apixaban  5 mg Oral BID  . atorvastatin  80 mg Oral Daily  . carvedilol  25 mg Oral BID WC  . Chlorhexidine Gluconate Cloth  6 each Topical Daily  . insulin aspart  0-5 Units Subcutaneous QHS  . insulin aspart  0-9 Units Subcutaneous TID WC  . isosorbide-hydrALAZINE  1 tablet Oral TID  . polyethylene glycol  17 g Oral Daily   Continuous Infusions: . diltiazem (CARDIZEM) infusion Stopped (03/27/20 1810)   PRN Meds: albuterol, ipratropium   Vital Signs    Vitals:   03/27/20 2317 03/28/20 0400 03/28/20 0613 03/28/20 0735  BP: (!) 162/104  (!) 159/91 (!) 154/90  Pulse: 97  89 86  Resp: 18  17 17   Temp: 98.4 F (36.9 C)  98.7 F (37.1 C) 98.4 F (36.9 C)  TempSrc: Oral  Oral Oral  SpO2: 98%  98% 94%  Weight:  (!) 141.7 kg    Height:        Intake/Output Summary (Last 24 hours) at 03/28/2020 1057 Last data filed at 03/28/2020 0828 Gross per 24 hour  Intake 381.26 ml  Output 6002 ml  Net -5620.74 ml   Last 3 Weights 03/28/2020 03/27/2020 03/27/2020  Weight (lbs) 312 lb 4.8 oz 312 lb 4.8 oz 320 lb  Weight (kg) 141.658 kg 141.658 kg 145.151 kg      Telemetry    SR - Personally Reviewed  Physical Exam  Pleasant male, sitting up in bed GEN: No acute distress.   Neck: No JVD Cardiac: RRR, no murmurs, rubs, or gallops.  Respiratory: Clear to auscultation bilaterally. GI: Soft, nontender, non-distended  MS: No edema; No deformity. Neuro:  Nonfocal  Psych: Normal affect   Labs    High Sensitivity Troponin:   Recent Labs  Lab 03/27/20 1105 03/27/20 1538  TROPONINIHS 15 11      Chemistry Recent Labs  Lab 03/27/20 1105 03/28/20 0243  NA 142 144  K 4.0 3.2*  CL 106 107  CO2 23 24  GLUCOSE 168* 127*  BUN 22 24*  CREATININE 2.35* 1.97*   CALCIUM 9.4 9.4  PROT 7.3 6.6  ALBUMIN 4.1 3.6  AST 32 23  ALT 69* 57*  ALKPHOS 96 83  BILITOT 0.9 1.2  GFRNONAA 31* 38*  ANIONGAP 13 13     Hematology Recent Labs  Lab 03/27/20 1105 03/28/20 0243  WBC 8.4 6.6  RBC 5.19 4.84  HGB 14.5 13.7  HCT 46.8 42.4  MCV 90.2 87.6  MCH 27.9 28.3  MCHC 31.0 32.3  RDW 14.6 14.9  PLT 246 235    BNPNo results for input(s): BNP, PROBNP in the last 168 hours.   DDimer No results for input(s): DDIMER in the last 168 hours.   Radiology    CT Abdomen Pelvis Wo Contrast  Result Date: 03/27/2020 CLINICAL DATA:  Abdomen pain with constipation EXAM: CT ABDOMEN AND PELVIS WITHOUT CONTRAST TECHNIQUE: Multidetector CT imaging of the abdomen and pelvis was performed following the standard protocol without IV contrast. COMPARISON:  03/27/2020 FINDINGS: Lower chest: Lung bases demonstrate no acute consolidation or effusion. Hazy right subpleural density probably reflects atelectasis. Hepatobiliary: No focal liver abnormality is seen. No gallstones, gallbladder wall thickening, or biliary dilatation. Pancreas: Unremarkable. No pancreatic ductal dilatation or surrounding inflammatory  changes. Spleen: Normal in size without focal abnormality. Adrenals/Urinary Tract: Nodular thickening of adrenal glands. 3.3 cm hypodense right adrenal nodule. 2.8 cm hypodense left adrenal nodule. Kidneys show mild renal pelvis enlargement and hydroureter but no stones. Moderate perinephric edema and fluid, nonspecific. Bladder is decompressed by Foley catheter. Stomach/Bowel: The stomach is nonenlarged. No dilated small bowel. Negative appendix. Large amount of stool in the colon without wall thickening. Vascular/Lymphatic: Moderate aortic atherosclerosis. No aneurysm. No suspicious nodes. Reproductive: Markedly enlarged prostate gland. Other: No free air. Bilateral small inguinal hernias containing fat on the left and fat and small fluid on the right. Musculoskeletal: No acute  or significant osseous findings. IMPRESSION: 1. Large amount of stool in the colon consistent with constipation. No evidence for bowel obstruction. 2. Markedly enlarged prostate gland. 3. Bilateral hypodense adrenal nodules, probably represent adenomas. 4. Nonspecific moderate perinephric stranding and fluid. Mild renal pelvis dilatation and hydroureter bilaterally but no obstructing stone disease. Aortic Atherosclerosis (ICD10-I70.0). Electronically Signed   By: Donavan Foil M.D.   On: 03/27/2020 18:04   DG Abdomen Acute W/Chest  Result Date: 03/27/2020 CLINICAL DATA:  Lower abdominal pain. EXAM: DG ABDOMEN ACUTE WITH 1 VIEW CHEST COMPARISON:  09/20/2019. FINDINGS: Soft tissue structures are unremarkable. Large amount of stool noted throughout the colon. Constipation could present in this fashion. Several nonspecific air-filled loops of small bowel noted. No bowel distention or free air. Aortoiliac atherosclerotic vascular calcification. Pelvic calcifications consistent phleboliths. Degenerative change lumbar spine and both hips. Cardiomegaly. No pulmonary venous congestion. Low lung volumes. Mild right base subsegmental atelectasis. No focal infiltrate. No pleural effusion or pneumothorax. Degenerative change thoracic spine. IMPRESSION: 1. Large amount of stool noted throughout the colon. Constipation could present this fashion. No bowel distention or free air. 2. Mild right base subsegmental atelectasis. No acute cardiopulmonary disease. Electronically Signed   By: Marcello Moores  Register   On: 03/27/2020 12:56    Cardiac Studies   N/a   Patient Profile     64 y.o. male with a history of DM, HTN, HLD, OSA on CPAP, CKD III, med non-compliance, A flutter dx 07/2019, HFrEF dx 07/2019 w/ EF 35-40%. Cards asked to see for Atrial flutter, RVR.  Assessment & Plan    1.  Atrial flutter, RVR: presented to the ED, duration was unknown but reported compliance with Eliquis. Successfully cardioverted to SR in the  ED. Has maintained SR overnight. Shortness of breath has resolved.  -- continue on coreg 25mg  BID and Eliquis 5mg  BID -- encouraged to ambulate this morning, likely DC this afternoon  2.  BPH with acute Urinary retention: improved w/ foley insertion, over 5L output yesterday. Foley has been dc'ed this morning. Cr improved 2.35>>1.97  3.  Constipation: Large stool burden noted on CT scan. Improved with enema and miralax  4. CKD stage III: Cr improved from 2.35>>1.97  5. HLD: on statin  For questions or updates, please contact [place] Please consult [email or web address] for contact info under    Signed, Reino Bellis, NP  03/28/2020, 10:57 AM

## 2020-03-28 NOTE — Plan of Care (Signed)

## 2020-03-28 NOTE — Progress Notes (Addendum)
Patient reported that he had large BM and he also voided while he was in the toilet. He stated it was " stream" not just few drops. Will initiate d/c process. Wife is on the way.

## 2020-03-28 NOTE — Assessment & Plan Note (Addendum)
BUN to creatinine 22/2.35 (baseline creatinine 1.9-2.0)  Renally adjust medications, avoid nephrotoxic agents/dehydration/hypotension -Renal function improved after constipation improved and bladder drained adequately

## 2020-03-28 NOTE — Assessment & Plan Note (Addendum)
Patient was successfully cardioverted to a normal sinus rhythm using 120 J of electricity EKG showed normal sinus rhythm at a rate of 85 bpm Continue Eliquis and Coreg

## 2020-03-28 NOTE — Progress Notes (Signed)
Pt has d/c orders. Pt did not voided since Foley has been removed. Encouraged ambulation and provided patient with plenty of fluids.

## 2020-03-28 NOTE — Assessment & Plan Note (Signed)
Patient counseled on diet and lifestyle modification

## 2020-03-28 NOTE — ED Notes (Signed)
Pt provided with a leg bag and belonging bag at request at this time.

## 2020-03-28 NOTE — Assessment & Plan Note (Signed)
Echo done on 08/19/2019 showed Left ventricular ejection fraction, by estimation, is 60 to 65%. The left ventricle has normal function. The left ventricle has no regional wall motion abnormalities. There is mild concentric left ventricular hypertrophy Echocardiogram done on 07/18/2019 showed Left ventricular ejection fraction, by estimation, is 25 to 30%. The left ventricle has severely decreased function Continue Coreg, Bidil, Benicar, Lipitor

## 2020-03-28 NOTE — ED Triage Notes (Signed)
Pt c/o urinary retention and states the last time he was able to void was at 1400 today. Pt c/o lower abdominal pain.

## 2020-03-28 NOTE — Assessment & Plan Note (Addendum)
-   large volume drained on insertion of Foley catheter; suspected that severe constipation contributed to urinary retention -Foley catheter removed prior to discharge and patient able to void -Bowel regimen recommended for patient upon discharge as well

## 2020-03-28 NOTE — ED Notes (Signed)
Introduced self to patient and explained role in plan of care. Pt appears to be resting comfortably in bed with no signs of acute distress noted. Bed is locked in the lowest position, side rails left down a patient request. All questions and concerns voiced addressed at this time. Pt educated on call light and hourly rounding, in agreement.

## 2020-03-28 NOTE — Assessment & Plan Note (Signed)
Continue insulin sliding scale and hypoglycemia protocol

## 2020-03-28 NOTE — Assessment & Plan Note (Signed)
CT abdomen and pelvis without contrast showed large amount of stool in the colon consistent with constipation and with no evidence for bowel obstruction Enema x1 will be given; Continue MiraLAX

## 2020-03-28 NOTE — ED Provider Notes (Signed)
[facility] EMERGENCY DEPARTMENT Provider Note   CSN: [id number] Arrival date & time: 03/28/20  1618     History Chief Complaint  Patient presents with  . Urinary Retention    Johnny [name] is a 64 y.o. male.  HPI      Johnny [name] is a 64 y.o. male with past medical history of atrial fibrillation, stage III CKD, diabetes, and hypertension, who presents to the Emergency Department complaining of urinary retention since 2 PM today.  He was seen yesterday at [facility].  He had complaints of urinary retention and constipation and was found to be in atrial flutter with rapid ventricular response.  His rate was unsuccessfully controlled with Cardizem and he was evaluated by cardiology and cardioverted.  A Foley catheter was inserted and the large amount of urine was immediately drained.  He had a CT of the abdomen and pelvis which confirmed constipation, he had no evidence for bowel obstruction noted to have a enlarged prostate gland.  He was discharged home day after having 2 bowel movements and voided a small amount of urine after the Foley catheter was removed.  Shortly after his discharge, he began feeling urinary urgency but again unable to void, so he returned to the emergency department.  At present, he complains of pressure sensation across his lower abdomen.  He denies chest pain, shortness of breath, tachycardia, nausea or vomiting    Past Medical History:  Diagnosis Date  . Atrial fibrillation ([place])   . CKD (chronic kidney disease) stage 3, GFR 30-59 ml/min ([place]) 06/10/2017  . Constipation 03/27/2020  . Diabetes mellitus   . Dyslipidemia 08/20/2018  . Essential hypertension 08/03/2007   Qualifier: Diagnosis of  By: Linda Hedges MD, Heinz Knuckles  Med ARB, diuretic    . Guttate psoriasis   . HTN (hypertension)   . Hyperlipidemia   . Hyperlipidemia associated with type 2 diabetes mellitus ([place]) 08/03/2007   Qualifier: Diagnosis of  By: Linda Hedges MD, Heinz Knuckles  meds - none   . 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 Sleep apnea   . Type II diabetes mellitus with renal manifestations, uncontrolled ([place]) 08/03/2007   Qualifier: Diagnosis of  By: Linda Hedges MD, Heinz Knuckles  Med metformin     Patient Active Problem List   Diagnosis Date Noted  . Adrenal nodule ([place]) 03/28/2020  . OSA (obstructive sleep apnea) 03/28/2020  . Obesity (BMI 30-39.9) 03/28/2020  . Acute renal failure superimposed on stage 3b chronic kidney disease ([place]) 03/28/2020  . Medication noncompliance due to cognitive impairment 03/28/2020  . Chronic systolic CHF (congestive heart failure) ([place]) 03/28/2020  . Constipation 03/27/2020  . Urinary retention 09/23/2019  . Benign prostatic hyperplasia with urinary obstruction 09/23/2019  . Atrial flutter with rapid ventricular response ([place]) 07/15/2019  . Acute on chronic combined systolic and diastolic CHF (congestive heart failure) ([place])   . Medically noncompliant   . 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 1 through stage 4 chronic kidney disease, or chronic kidney disease ([place])   . Atrial flutter ([place]) 07/14/2019  . Type 2 diabetes mellitus with hyperglycemia, without long-term current use of insulin ([place]) 03/28/2019  . Type 2 diabetes mellitus with diabetic polyneuropathy, without long-term current use of insulin ([place]) 03/28/2019  . Type 2 diabetes mellitus with stage 3 chronic kidney disease, without long-term current use of insulin ([place]) 01/03/2019  . Dyslipidemia 08/20/2018  . CKD (chronic  kidney disease) stage 3, GFR 30-59 ml/min ([facility]) 06/10/2017  . Routine health maintenance 11/06/2011  . 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 Type II diabetes mellitus with renal manifestations, uncontrolled ([place]) 08/03/2007  . Hyperlipidemia associated with type 2 diabetes mellitus ([place]) 08/03/2007  . Essential hypertension 08/03/2007    Past Surgical History:  Procedure Laterality Date  .  CARDIOVERSION N/A 07/18/2019   Procedure: CARDIOVERSION;  Surgeon: Dorothy Spark, MD;  Location: [facility] ENDOSCOPY;  Service: Cardiovascular;  Laterality: N/A;  . TEE WITHOUT CARDIOVERSION N/A 07/18/2019   Procedure: TRANSESOPHAGEAL ECHOCARDIOGRAM (TEE);  Surgeon: Dorothy Spark, MD;  Location: [facility] ENDOSCOPY;  Service: Cardiovascular;  Laterality: N/A;  . TOOTH EXTRACTION         Family History  Problem Relation Age of Onset  . Hypertension Father        DOB [birth year]  . Hyperlipidemia Father   . Diabetes Father   . Coronary artery disease Father        with stents  . Nephrolithiasis Father   . Kidney disease Father        s/p rejected kidney transplant; on HD  . Memory loss Mother        DOB 49  . Kidney disease Mother        s/p nephrectomy, infection problem  . Dementia Mother   . Cancer Brother        lung  . HIV Brother   . Prostate cancer Neg Hx   . Colon cancer Neg Hx     Social History   Tobacco Use  . Smoking status: Passive Smoke Exposure - Never Smoker  . Smokeless tobacco: Never Used  Vaping Use  . Vaping Use: Never used  Substance Use Topics  . Alcohol use: No  . Drug use: No    Home Medications Prior to Admission medications   Medication Sig Start Date End Date Taking? Authorizing Provider  alfuzosin (UROXATRAL) 10 MG 24 hr tablet Take 1 tablet (10 mg total) by mouth daily with breakfast. 09/23/19   McKenzie, Candee Furbish, MD  ammonium lactate (AMLACTIN) 12 % lotion Apply 1 application topically as needed for dry skin. 03/02/20   Felipa Furnace, DPM  apixaban (ELIQUIS) 5 MG TABS tablet Take 1 tablet (5 mg total) by mouth 2 (two) times daily. 07/25/19   Daune Perch, NP  atorvastatin (LIPITOR) 80 MG tablet Take 1 tablet (80 mg total) by mouth daily. 07/20/19 07/19/20  Daune Perch, NP  carvedilol (COREG) 25 MG tablet Take 1 tablet (25 mg total) by mouth 2 (two) times daily with a meal. 07/19/19 07/18/20  Daune Perch, NP  furosemide (LASIX) 20 MG tablet  Take 1 tablet (20 mg total) by mouth daily. 10/04/19   Sueanne Margarita, MD  glipiZIDE (GLUCOTROL) 5 MG tablet Take 1 tablet (5 mg total) by mouth 2 (two) times daily before a meal. 02/03/20   Shamleffer, Melanie Crazier, MD  isosorbide-hydrALAZINE (BIDIL) 20-37.5 MG tablet Take 1 tablet by mouth 3 (three) times daily. 07/19/19 07/18/20  Daune Perch, NP  metFORMIN (GLUCOPHAGE-XR) 500 MG 24 hr tablet TAKE 1 TABLET BY MOUTH TWICE A DAY Patient taking differently: Take 500 mg by mouth daily with breakfast.  12/29/19   Shamleffer, Melanie Crazier, MD  olmesartan-hydrochlorothiazide (BENICAR HCT) 40-12.5 MG tablet TAKE 1 TABLET BY MOUTH EVERY DAY Patient taking differently: Take 1 tablet by mouth daily.  12/15/19   [name], MD  TRULICITY 1.5 IP/3.8SN SOPN INJECT 1.5 MG INTO THE SKIN ONCE  A WEEK. 03/28/20   Shamleffer, Melanie Crazier, MD    Allergies    Patient has no known allergies.  Review of Systems   Review of Systems  Constitutional: Negative for appetite change, chills and fever.  Respiratory: Negative for chest tightness and shortness of breath.   Cardiovascular: Negative for chest pain and leg swelling.  Gastrointestinal: Positive for abdominal pain (lower abdominal pain). Negative for blood in stool, nausea and vomiting.  Genitourinary: Positive for difficulty urinating. Negative for discharge, dysuria, flank pain, penile swelling, scrotal swelling, testicular pain and urgency.  Musculoskeletal: Negative for back pain.  Skin: Negative for color change and rash.  Neurological: Negative for dizziness, syncope, weakness, numbness and headaches.  Hematological: Negative for adenopathy.    Physical Exam Updated Vital Signs BP (!) 143/90 (BP Location: Right Arm)   Pulse 80   Temp 99 F (37.2 C) (Oral)   Resp 18   Ht 6\' 5"  (1.956 m)   Wt (!) 141.7 kg   SpO2 97%   BMI 37.03 kg/m   Physical Exam Vitals and nursing note reviewed.  Constitutional:      General: He is not  in acute distress.    Appearance: He is well-developed.  HENT:     Head: Atraumatic.     Mouth/Throat:     Mouth: Mucous membranes are moist.  Cardiovascular:     Rate and Rhythm: Normal rate and regular rhythm.     Heart sounds: Normal heart sounds. No murmur heard.   Pulmonary:     Effort: Pulmonary effort is normal. No respiratory distress.     Breath sounds: Normal breath sounds.  Abdominal:     General: There is no distension.     Palpations: Abdomen is soft. There is no mass.     Tenderness: There is abdominal tenderness. There is no right CVA tenderness, left CVA tenderness, guarding or rebound.     Comments: Diffuse suprapubic tenderness.  abd is soft.    Musculoskeletal:        General: Normal range of motion.     Cervical back: Normal range of motion.  Skin:    General: Skin is warm.     Findings: No rash.  Neurological:     General: No focal deficit present.     Mental Status: He is alert.     Sensory: No sensory deficit.     Motor: No weakness or abnormal muscle tone.     ED Results / Procedures / Treatments   Labs (all labs ordered are listed, but only abnormal results are displayed) Labs Reviewed  URINALYSIS, ROUTINE W REFLEX MICROSCOPIC    EKG None  Radiology   Procedures Procedures (including critical care time)  Medications Ordered in ED Medications - No data to display  ED Course  I have reviewed the triage vital signs and the nursing notes.  Pertinent labs & imaging results that were available during my care of the patient were reviewed by me and considered in my medical decision making (see chart for details).  Clinical Course as of Mar 28 1900  Wed Mar 28, 4712  2560 64 year old male with recent hospitalization and urinary retention had the catheter removed prior to discharge.  He is back with unable to urinate.  He said he has needed a catheter before.  Has followed with Dr. Alyson Ingles [facility].  He had 400 cc by bladder scan and good  improvement of symptoms after Foley catheter placement.  Check urinalysis and then likely  can discharge.   [MB]    Clinical Course User Index [MB] Hayden Rasmussen, MD   MDM Rules/Calculators/A&P                          Patient here for urinary retention.  Has history of same and known enlarged prostate gland.  He was discharged earlier today from [facility] after found to be in atrial flutter.  He was cardioverted.  He denies any chest pain or dyspnea at present and heart rate is 80.  He had Foley catheter inserted during his hospital stay.  Foley catheter was removed and he was able to void a small amount at time of discharge, but unable to void since.  Bladder scan here shows 200 cc fluid volume in the bladder.  Foley catheter was inserted and immediately drained 600 cc of straw-colored urine. On my recheck, he has an additional 400 cc urine in the bag.  Patient reports significant improvement of his abdominal discomfort.    U/A pending, discussed with Evalee Jefferson, PA-C who agrees to review U/A results prior to dispo.  He is feeling much better and will likely be discharged home.  Doubt urinary infection as he had a negative urine culture performed yesterday. He has requested a leg bag as well.  He has seen Dr. Alyson Ingles previously and prefers to f/u with him    Final Clinical Impression(s) / ED Diagnoses Final diagnoses:  Urinary retention    Rx / DC Orders ED Discharge Orders    None       Kem Parkinson, PA-C 03/28/20 1919    Hayden Rasmussen, MD 03/29/20 1007

## 2020-03-28 NOTE — Discharge Summary (Signed)
Physician Discharge Summary   Johnny [name] QIO:[id number] DOB: 06-Jun-1955 DOA: 03/27/2020  PCP: [name], MD  Admit date: 03/27/2020 Discharge date: 03/28/2020  Admitted From: home Disposition:  home Discharging physician: Dwyane Dee, MD  Recommendations for Outpatient Follow-up:  1. 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 Yes; Fluid consistency: Thin  Diet effective now       Question Answer Comment  Room service appropriate? Yes   Fluid consistency: Thin      03/27/20 2215          Hospital Course: Johnny [name] is a 64 y.o. male with medical history significant for rapid atrial flutter, HFrEF, hypertension, CKD 3, hypertension, hyperlipidemia, type 2 diabetes mellitus, BPH, OSA not on CPAP and obesity who presents to the emergency department due to 3-day onset of constipation (last bowel movement was on Sunday-11/21), he states that he took Metamucil and high-fiber foods without improvement.  He also complained of 1 day of difficulty in urination.  He denies fever, chills, chest pain, shortness of breath, nausea or vomiting.  He went to an urgent care and was noted to be in atrial flutter with RVR and elevated blood pressure, so he was sent to the emergency department for further evaluation and management.    In the emergency department, he was tachycardic and tachypneic on arrival.  BP was 186/126. He was started on IV Cardizem drip due to atrial flutter with RVR without any response, cardiology was consulted, patient was cardioverted to sinus rhythm using 120 J of electricity after being  sedated with 140 mg of propofol.  Foley catheter was inserted due to urinary retention and 3.4 L of urine was immediately drained, patient also endorsed improved abdominal discomfort.  Work-up in the ED showed normal CBC, BUN to creatinine 22/2.35 (baseline creatinine 1.9-2.0) and hyperglycemia.  ALT 69.  CT abdomen and pelvis without contrast showed large amount of stool in the colon consistent with constipation and with no evidence for bowel obstruction.  Markedly enlarged prostate gland noted.   He had a bowel movement overnight after admission with ongoing laxative use.  The following morning his Foley catheter was removed for a voiding trial. He was able to adequately void prior to discharge.  He was continued on his remaining home regimen and considered stable for discharge home.   * Atrial flutter with rapid ventricular response (HCC) Patient was successfully cardioverted to a normal sinus rhythm using 120 J of electricity EKG showed normal sinus rhythm at a rate of 85 bpm Continue Eliquis and Coreg  Acute renal failure superimposed on stage 3b chronic kidney disease (HCC) BUN to creatinine 22/2.35 (baseline creatinine 1.9-2.0)  Renally adjust medications, avoid nephrotoxic agents/dehydration/hypotension -Renal function improved after constipation improved and bladder drained adequately   Benign prostatic hyperplasia with urinary obstruction - large volume drained on insertion of Foley catheter; suspected that severe constipation contributed to urinary retention -Foley catheter removed prior to discharge and patient able to void -Bowel regimen recommended for patient upon discharge as well  Constipation CT  abdomen and pelvis without contrast showed large amount of stool in the colon consistent with constipation and with no evidence for bowel obstruction Enema x1 will be given; Continue MiraLAX   Type 2 diabetes mellitus with hyperglycemia, without long-term current use of insulin  (HCC) Continue insulin sliding scale and hypoglycemia protocol  Chronic systolic CHF (congestive heart failure) ([place]) Echo done on 08/19/2019 showed Left ventricular ejection fraction, by estimation, is 60 to 65%. The left ventricle has normal function. The left ventricle has no regional wall motion abnormalities. There is mild concentric left ventricular hypertrophy Echocardiogram done on 07/18/2019 showed Left ventricular ejection fraction, by estimation, is 25 to 30%. The left ventricle has severely decreased function Continue Coreg, Bidil, Benicar, Lipitor  Obesity (BMI 30-39.9) Patient counseled on diet and lifestyle modification  OSA (obstructive sleep apnea) Patient states that he will follow up on further studies regarding need for CPAP  Adrenal nodule ([place]) CT abdomen and pelvis showed Bilateral hypodense adrenal nodules, probably represent adenomas Continue follow-up with outpatient PCP  Dyslipidemia Continue Lipitor  Essential hypertension Continue Coreg, Bidil, Benicar   The patient's chronic medical conditions were treated accordingly per the patient's home medication regimen except as noted.  On day of discharge, patient was felt deemed stable for discharge. Patient/family member advised to call PCP or come back to ER if needed.   Principal Diagnosis: Atrial flutter with rapid ventricular response [facility])  Discharge Diagnoses: Active Hospital Problems   Diagnosis Date Noted  . Atrial flutter with rapid ventricular response ([place]) 07/15/2019    Priority: High  . Acute renal failure superimposed on stage 3b chronic kidney disease ([place]) 03/28/2020    Priority: Medium  . Benign prostatic hyperplasia with urinary obstruction 09/23/2019    Priority: Medium  . Constipation 03/27/2020    Priority: Low  . Type 2 diabetes mellitus with hyperglycemia, without long-term current use of insulin ([place]) 03/28/2019    Priority: Low  . Adrenal nodule ([place]) 03/28/2020  . OSA  (obstructive sleep apnea) 03/28/2020  . Obesity (BMI 30-39.9) 03/28/2020  . Medication noncompliance due to cognitive impairment 03/28/2020  . Chronic systolic CHF (congestive heart failure) ([place]) 03/28/2020  . Dyslipidemia 08/20/2018  . Essential hypertension 08/03/2007    Resolved Hospital Problems  No resolved problems to display.    Discharge Instructions    Diet - low sodium heart healthy   Complete by: As directed    Diet Carb Modified   Complete by: As directed    Increase activity slowly   Complete by: As directed      Allergies as of 03/28/2020   No Known Allergies     Medication List    TAKE these medications   alfuzosin 10 MG 24 hr tablet Commonly known as: UROXATRAL Take 1 tablet (10 mg total) by mouth daily with breakfast.   ammonium lactate 12 % lotion Commonly known as: AmLactin Apply 1 application topically as needed for dry skin.   apixaban 5 MG Tabs tablet Commonly known as: ELIQUIS Take 1 tablet (5 mg total) by mouth 2 (two) times daily.   atorvastatin 80 MG tablet Commonly known as: LIPITOR Take 1 tablet (80 mg total) by mouth daily.   carvedilol 25 MG tablet Commonly known as: COREG Take 1 tablet (25 mg total) by mouth 2 (two) times daily with a meal.   furosemide 20 MG tablet Commonly known as: LASIX Take 1 tablet (20 mg total) by mouth daily.   glipiZIDE 5 MG tablet Commonly known as: GLUCOTROL  Take 1 tablet (5 mg total) by mouth 2 (two) times daily before a meal.   isosorbide-hydrALAZINE 20-37.5 MG tablet Commonly known as: BIDIL Take 1 tablet by mouth 3 (three) times daily.   metFORMIN 500 MG 24 hr tablet Commonly known as: GLUCOPHAGE-XR TAKE 1 TABLET BY MOUTH TWICE A DAY What changed: when to take this   olmesartan-hydrochlorothiazide 40-12.5 MG tablet Commonly known as: BENICAR HCT TAKE 1 TABLET BY MOUTH EVERY DAY   Trulicity 1.5 XI/3.3AS Sopn Generic drug: Dulaglutide INJECT 1.5 MG INTO THE SKIN ONCE A WEEK.        Follow-up Information    [name], MD On 04/03/2020.   Specialty: Internal Medicine Why: @ 1:30 Contact information: [place] [address] [phone]              No Known Allergies  Consultations: Cardiology  Discharge Exam: BP 124/86 (BP Location: Left Arm)   Pulse 81   Temp 98.1 F (36.7 C) (Oral)   Resp 17   Ht 6\' 5"  (1.956 m)   Wt (!) 141.7 kg   SpO2 95%   BMI 37.03 kg/m  General appearance: alert, cooperative and no distress Head: Normocephalic, without obvious abnormality, atraumatic Eyes: EOMI Lungs: clear to auscultation bilaterally Heart: regular rate and rhythm and S1, S2 normal Abdomen: obese, soft, NT, ND, BS present Extremities: no edema Skin: mobility and turgor normal Neurologic: Grossly normal  The results of significant diagnostics from this hospitalization (including imaging, microbiology, ancillary and laboratory) are listed below for reference.   Microbiology: Recent Results (from the past 240 hour(s))  Urine culture     Status: None   Collection Time: 03/27/20  1:02 PM   Specimen: Urine, Random  Result Value Ref Range Status   Specimen Description URINE, RANDOM  Final   Special Requests NONE  Final   Culture   Final    NO GROWTH Performed at [facility], [address], [place], [place] [address]    Report Status 03/28/2020 FINAL  Final  Respiratory Panel by RT PCR (Flu A&B, Covid) - Nasopharyngeal Swab     Status: None   Collection Time: 03/27/20  1:49 PM   Specimen: Nasopharyngeal Swab; Nasopharyngeal(NP) swabs in vial transport medium  Result Value Ref Range Status   SARS Coronavirus 2 by RT PCR NEGATIVE NEGATIVE Final    Comment: (NOTE) SARS-CoV-2 target nucleic acids are NOT DETECTED.  The SARS-CoV-2 RNA is generally detectable in upper respiratoy specimens during the acute phase of infection. The lowest concentration of SARS-CoV-2 viral copies this assay can detect is 131 copies/mL.  A negative result does not preclude SARS-Cov-2 infection and should not be used as the sole basis for treatment or other patient management decisions. A negative result may occur with  improper specimen collection/handling, submission of specimen other than nasopharyngeal swab, presence of viral mutation(s) within the areas targeted by this assay, and inadequate number of viral copies (<131 copies/mL). A negative result must be combined with clinical observations, patient history, and epidemiological information. The expected result is Negative.  Fact Sheet for Patients:  PinkCheek.be  Fact Sheet for Healthcare Providers:  GravelBags.it  This test is no t yet approved or cleared by the [place] FDA and  has been authorized for detection and/or diagnosis of SARS-CoV-2 by FDA under an Emergency Use Authorization (EUA). This EUA will remain  in effect (meaning this test can be used) for the duration of the COVID-19 declaration under Section  564(b)(1) of the Act, 21 U.S.C. section 360bbb-3(b)(1), unless the authorization is terminated or revoked sooner.     Influenza A by PCR NEGATIVE NEGATIVE Final   Influenza B by PCR NEGATIVE NEGATIVE Final    Comment: (NOTE) The Xpert Xpress SARS-CoV-2/FLU/RSV assay is intended as an aid in  the diagnosis of influenza from Nasopharyngeal swab specimens and  should not be used as a sole basis for treatment. Nasal washings and  aspirates are unacceptable for Xpert Xpress SARS-CoV-2/FLU/RSV  testing.  Fact Sheet for Patients: PinkCheek.be  Fact Sheet for Healthcare Providers: GravelBags.it  This test is not yet approved or cleared by the [place] FDA and  has been authorized for detection and/or diagnosis of SARS-CoV-2 by  FDA under an Emergency Use Authorization (EUA). This EUA will remain  in effect (meaning this test  can be used) for the duration of the  Covid-19 declaration under Section 564(b)(1) of the Act, 21  U.S.C. section 360bbb-3(b)(1), unless the authorization is  terminated or revoked. Performed at [facility], [place] [address] [place], [place] [address]      Labs: BNP (last 3 results) No results for input(s): BNP in the last 8760 hours. Basic Metabolic Panel: Recent Labs  Lab 03/27/20 1105 03/28/20 0243  NA 142 144  K 4.0 3.2*  CL 106 107  CO2 23 24  GLUCOSE 168* 127*  BUN 22 24*  CREATININE 2.35* 1.97*  CALCIUM 9.4 9.4  MG  --  2.0  PHOS  --  4.6   Liver Function Tests: Recent Labs  Lab 03/27/20 1105 03/28/20 0243  AST 32 23  ALT 69* 57*  ALKPHOS 96 83  BILITOT 0.9 1.2  PROT 7.3 6.6  ALBUMIN 4.1 3.6   No results for input(s): LIPASE, AMYLASE in the last 168 hours. No results for input(s): AMMONIA in the last 168 hours. CBC: Recent Labs  Lab 03/27/20 1105 03/28/20 0243  WBC 8.4 6.6  NEUTROABS 7.1  --   HGB 14.5 13.7  HCT 46.8 42.4  MCV 90.2 87.6  PLT 246 235   Cardiac Enzymes: No results for input(s): CKTOTAL, CKMB, CKMBINDEX, TROPONINI in the last 168 hours. BNP: Invalid input(s): POCBNP CBG: Recent Labs  Lab 03/28/20 0303 03/28/20 0558 03/28/20 1124  GLUCAP 117* 122* 140*   D-Dimer No results for input(s): DDIMER in the last 72 hours. Hgb A1c No results for input(s): HGBA1C in the last 72 hours. Lipid Profile No results for input(s): CHOL, HDL, LDLCALC, TRIG, CHOLHDL, LDLDIRECT in the last 72 hours. Thyroid function studies No results for input(s): TSH, T4TOTAL, T3FREE, THYROIDAB in the last 72 hours.  Invalid input(s): FREET3 Anemia work up No results for input(s): VITAMINB12, FOLATE, FERRITIN, TIBC, IRON, RETICCTPCT in the last 72 hours. Urinalysis    Component Value Date/Time   COLORURINE YELLOW 03/27/2020 1259   APPEARANCEUR HAZY (A) 03/27/2020 1259   LABSPEC 1.011 03/27/2020 1259   PHURINE 5.0 03/27/2020 1259   GLUCOSEU  50 (A) 03/27/2020 1259   GLUCOSEU >=1000 04/23/2009 0911   HGBUR LARGE (A) 03/27/2020 1259   BILIRUBINUR NEGATIVE 03/27/2020 1259   BILIRUBINUR moderate 11/04/2019 1419   KETONESUR NEGATIVE 03/27/2020 1259   PROTEINUR 100 (A) 03/27/2020 1259   UROBILINOGEN negative (A) 11/04/2019 1419   UROBILINOGEN 0.2 04/23/2009 0911   NITRITE NEGATIVE 03/27/2020 1259   LEUKOCYTESUR NEGATIVE 03/27/2020 1259   Sepsis Labs Invalid input(s): PROCALCITONIN,  WBC,  LACTICIDVEN Microbiology Recent Results (from the past 240 hour(s))  Urine culture  Status: None   Collection Time: 03/27/20  1:02 PM   Specimen: Urine, Random  Result Value Ref Range Status   Specimen Description URINE, RANDOM  Final   Special Requests NONE  Final   Culture   Final    NO GROWTH Performed at [facility], [address] [place], [place] [address]    Report Status 03/28/2020 FINAL  Final  Respiratory Panel by RT PCR (Flu A&B, Covid) - Nasopharyngeal Swab     Status: None   Collection Time: 03/27/20  1:49 PM   Specimen: Nasopharyngeal Swab; Nasopharyngeal(NP) swabs in vial transport medium  Result Value Ref Range Status   SARS Coronavirus 2 by RT PCR NEGATIVE NEGATIVE Final    Comment: (NOTE) SARS-CoV-2 target nucleic acids are NOT DETECTED.  The SARS-CoV-2 RNA is generally detectable in upper respiratoy specimens during the acute phase of infection. The lowest concentration of SARS-CoV-2 viral copies this assay can detect is 131 copies/mL. A negative result does not preclude SARS-Cov-2 infection and should not be used as the sole basis for treatment or other patient management decisions. A negative result may occur with  improper specimen collection/handling, submission of specimen other than nasopharyngeal swab, presence of viral mutation(s) within the areas targeted by this assay, and inadequate number of viral copies (<131 copies/mL). A negative result must be combined with clinical observations,  patient history, and epidemiological information. The expected result is Negative.  Fact Sheet for Patients:  PinkCheek.be  Fact Sheet for Healthcare Providers:  GravelBags.it  This test is no t yet approved or cleared by the [place] FDA and  has been authorized for detection and/or diagnosis of SARS-CoV-2 by FDA under an Emergency Use Authorization (EUA). This EUA will remain  in effect (meaning this test can be used) for the duration of the COVID-19 declaration under Section 564(b)(1) of the Act, 21 U.S.C. section 360bbb-3(b)(1), unless the authorization is terminated or revoked sooner.     Influenza A by PCR NEGATIVE NEGATIVE Final   Influenza B by PCR NEGATIVE NEGATIVE Final    Comment: (NOTE) The Xpert Xpress SARS-CoV-2/FLU/RSV assay is intended as an aid in  the diagnosis of influenza from Nasopharyngeal swab specimens and  should not be used as a sole basis for treatment. Nasal washings and  aspirates are unacceptable for Xpert Xpress SARS-CoV-2/FLU/RSV  testing.  Fact Sheet for Patients: PinkCheek.be  Fact Sheet for Healthcare Providers: GravelBags.it  This test is not yet approved or cleared by the [place] FDA and  has been authorized for detection and/or diagnosis of SARS-CoV-2 by  FDA under an Emergency Use Authorization (EUA). This EUA will remain  in effect (meaning this test can be used) for the duration of the  Covid-19 declaration under Section 564(b)(1) of the Act, 21  U.S.C. section 360bbb-3(b)(1), unless the authorization is  terminated or revoked. Performed at [facility], [place] [address], [place], [place] [address]     Procedures/Studies: CT Abdomen Pelvis Wo Contrast  Result Date: 03/27/2020 CLINICAL DATA:  Abdomen pain with constipation EXAM: CT ABDOMEN AND PELVIS WITHOUT CONTRAST TECHNIQUE: Multidetector CT imaging  of the abdomen and pelvis was performed following the standard protocol without IV contrast. COMPARISON:  03/27/2020 FINDINGS: Lower chest: Lung bases demonstrate no acute consolidation or effusion. Hazy right subpleural density probably reflects atelectasis. Hepatobiliary: No focal liver abnormality is seen. No gallstones, gallbladder wall thickening, or biliary dilatation. Pancreas: Unremarkable. No pancreatic ductal dilatation or surrounding inflammatory changes. Spleen: Normal in size  without focal abnormality. Adrenals/Urinary Tract: Nodular thickening of adrenal glands. 3.3 cm hypodense right adrenal nodule. 2.8 cm hypodense left adrenal nodule. Kidneys show mild renal pelvis enlargement and hydroureter but no stones. Moderate perinephric edema and fluid, nonspecific. Bladder is decompressed by Foley catheter. Stomach/Bowel: The stomach is nonenlarged. No dilated small bowel. Negative appendix. Large amount of stool in the colon without wall thickening. Vascular/Lymphatic: Moderate aortic atherosclerosis. No aneurysm. No suspicious nodes. Reproductive: Markedly enlarged prostate gland. Other: No free air. Bilateral small inguinal hernias containing fat on the left and fat and small fluid on the right. Musculoskeletal: No acute or significant osseous findings. IMPRESSION: 1. Large amount of stool in the colon consistent with constipation. No evidence for bowel obstruction. 2. Markedly enlarged prostate gland. 3. Bilateral hypodense adrenal nodules, probably represent adenomas. 4. Nonspecific moderate perinephric stranding and fluid. Mild renal pelvis dilatation and hydroureter bilaterally but no obstructing stone disease. Aortic Atherosclerosis (ICD10-I70.0). Electronically Signed   By: Donavan Foil M.D.   On: 03/27/2020 18:04   DG Abdomen Acute W/Chest  Result Date: 03/27/2020 CLINICAL DATA:  Lower abdominal pain. EXAM: DG ABDOMEN ACUTE WITH 1 VIEW CHEST COMPARISON:  09/20/2019. FINDINGS: Soft tissue  structures are unremarkable. Large amount of stool noted throughout the colon. Constipation could present in this fashion. Several nonspecific air-filled loops of small bowel noted. No bowel distention or free air. Aortoiliac atherosclerotic vascular calcification. Pelvic calcifications consistent phleboliths. Degenerative change lumbar spine and both hips. Cardiomegaly. No pulmonary venous congestion. Low lung volumes. Mild right base subsegmental atelectasis. No focal infiltrate. No pleural effusion or pneumothorax. Degenerative change thoracic spine. IMPRESSION: 1. Large amount of stool noted throughout the colon. Constipation could present this fashion. No bowel distention or free air. 2. Mild right base subsegmental atelectasis. No acute cardiopulmonary disease. Electronically Signed   By: Marcello Moores  Register   On: 03/27/2020 12:56     Time coordinating discharge: Over 30 minutes    Dwyane Dee, MD  [facility] 03/28/2020, 4:02 PM

## 2020-03-28 NOTE — ED Provider Notes (Signed)
Patient signed out to me at shift change from [facility], [place].  Patient with urinary retention which he promptly responded once Foley catheter was placed.  Has a history of BPH.  Urinalysis resulted and he has no clear-cut UTI.  He does have a large amount of hemoglobin, probably secondary to Foley placement.  However he also has 11-20 WBCs in his urine.  A new culture was ordered.  Patient planned follow-up with his urologist Dr. Alyson Ingles.  He was sent home with Foley catheter including leg bag.   Evalee Jefferson, PA-C 03/28/20 1955    Hayden Rasmussen, MD 03/29/20 1007

## 2020-03-28 NOTE — Progress Notes (Signed)
Foley removed per order at 9 AM.

## 2020-03-28 NOTE — Assessment & Plan Note (Signed)
CT abdomen and pelvis showed Bilateral hypodense adrenal nodules, probably represent adenomas Continue follow-up with outpatient PCP

## 2020-03-28 NOTE — Progress Notes (Signed)
D/C instructions given and reviewed. No questions asked but encouraged to call with any concerns. Tele and IV removed, tolerated well. 

## 2020-03-28 NOTE — Hospital Course (Signed)
Johnny [name] is a 64 y.o. male with medical history significant for rapid atrial flutter, HFrEF, hypertension, CKD 3, hypertension, hyperlipidemia, type 2 diabetes mellitus, BPH, OSA not on CPAP and obesity who presents to the emergency department due to 3-day onset of constipation (last bowel movement was on Sunday-11/21), he states that he took Metamucil and high-fiber foods without improvement.  He also complained of 1 day of difficulty in urination.  He denies fever, chills, chest pain, shortness of breath, nausea or vomiting.  He went to an urgent care and was noted to be in atrial flutter with RVR and elevated blood pressure, so he was sent to the emergency department for further evaluation and management.    In the emergency department, he was tachycardic and tachypneic on arrival.  BP was 186/126. He was started on IV Cardizem drip due to atrial flutter with RVR without any response, cardiology was consulted, patient was cardioverted to sinus rhythm using 120 J of electricity after being sedated with 140 mg of propofol.  Foley catheter was inserted due to urinary retention and 3.4 L of urine was immediately drained, patient also endorsed improved abdominal discomfort.  Work-up in the ED showed normal CBC, BUN to creatinine 22/2.35 (baseline creatinine 1.9-2.0) and hyperglycemia.  ALT 69.  CT abdomen and pelvis without contrast showed large amount of stool in the colon consistent with constipation and with no evidence for bowel obstruction.  Markedly enlarged prostate gland noted.   He had a bowel movement overnight after admission with ongoing laxative use.  The following morning his Foley catheter was removed for a voiding trial. He was able to adequately void prior to discharge.  He was continued on his remaining home regimen and considered stable for discharge home.

## 2020-03-28 NOTE — Assessment & Plan Note (Signed)
-  Continue Lipitor °

## 2020-03-28 NOTE — Discharge Instructions (Addendum)
Call Dr. Noland Fordyce office Friday to arrange a follow-up appt.  Keep the foley catheter in place until your urology appt.  You may switch from the foley bag to the leg bag as needed.

## 2020-03-28 NOTE — Assessment & Plan Note (Signed)
Continue Coreg, Bidil, Benicar

## 2020-03-28 NOTE — Assessment & Plan Note (Signed)
Patient states that he will follow up on further studies regarding need for CPAP

## 2020-03-30 LAB — URINE CULTURE: Culture: NO GROWTH

## 2020-04-03 ENCOUNTER — Inpatient Hospital Stay: Payer: BC Managed Care – PPO | Admitting: Internal Medicine

## 2020-04-05 ENCOUNTER — Telehealth: Payer: Self-pay

## 2020-04-05 ENCOUNTER — Other Ambulatory Visit: Payer: Self-pay

## 2020-04-05 ENCOUNTER — Ambulatory Visit (INDEPENDENT_AMBULATORY_CARE_PROVIDER_SITE_OTHER): Payer: BC Managed Care – PPO | Admitting: Urology

## 2020-04-05 ENCOUNTER — Encounter: Payer: Self-pay | Admitting: Urology

## 2020-04-05 VITALS — BP 193/81 | HR 80 | Temp 98.9°F | Ht 77.0 in | Wt 315.0 lb

## 2020-04-05 DIAGNOSIS — N401 Enlarged prostate with lower urinary tract symptoms: Secondary | ICD-10-CM

## 2020-04-05 DIAGNOSIS — R339 Retention of urine, unspecified: Secondary | ICD-10-CM

## 2020-04-05 DIAGNOSIS — N138 Other obstructive and reflux uropathy: Secondary | ICD-10-CM

## 2020-04-05 MED ORDER — FINASTERIDE 5 MG PO TABS: 5.0000 mg | ORAL_TABLET | Freq: Every day | ORAL | 3 refills | Status: DC

## 2020-04-05 MED ORDER — ALFUZOSIN HCL ER 10 MG PO TB24
10.0000 mg | ORAL_TABLET | Freq: Every day | ORAL | 11 refills | Status: DC
Start: 2020-04-05 — End: 2020-06-03

## 2020-04-05 MED ORDER — MAGNESIUM CITRATE PO SOLN: 0.5000 | Freq: Once | ORAL | 0 refills | Status: AC

## 2020-04-05 NOTE — Telephone Encounter (Signed)
Left message for patient to call back to schedule an appointment for cardiac clearance prior to his surgery scheduled for 05/31/20

## 2020-04-05 NOTE — Progress Notes (Signed)
Simple Catheter Placement  Due to urinary retention patient is present today for a foley cath placement.  Patient was cleaned and prepped in a sterile fashion with betadine. A 18 FR foley catheter was inserted, urine return was noted  573ml, urine was yellow in color.  The balloon was filled with 10cc of sterile water.  A leg bag was attached for drainage. Patient was also given a night bag to take home and was given instruction on how to change from one bag to another.  Patient was given instruction on proper catheter care.  Patient tolerated well, no complications were noted   Performed by: Antionette Char, Adley Castello,LPN  Additional notes/ Follow up: 1 month

## 2020-04-05 NOTE — Progress Notes (Signed)
Urological Symptom Review  Patient is experiencing the following symptoms: Frequent urination Get up at night to urinate Have to strain to urinate   Review of Systems  Gastrointestinal (upper)  : Negative for upper GI symptoms  Gastrointestinal (lower) : Negative for lower GI symptoms  Constitutional : Negative for symptoms  Skin: Skin rash/lesion  Eyes: Negative for eye symptoms  Ear/Nose/Throat : Negative for Ear/Nose/Throat symptoms  Hematologic/Lymphatic: Negative for Hematologic/Lymphatic symptoms  Cardiovascular : Leg swelling  Respiratory : Negative for respiratory symptoms  Endocrine: Negative for endocrine symptoms  Musculoskeletal: Negative for musculoskeletal symptoms  Neurological: Negative for neurological symptoms  Psychologic: Negative for psychiatric symptoms

## 2020-04-05 NOTE — Patient Instructions (Addendum)
Your surgery is scheduled for January 27th, 2022 at [facility]. The hospital will call you with your pre op appointment.  After you surgery, you will come back to see Dr. Alyson Ingles on February 9th. Prior to coming to the office you will have an image completed at [facility] that morning to ensure the catheter is okay to be removed. Scheduling will call you with your appointment for radiology.   We will change your catheter in January one more time before your procedure. Your appointments are listed on this paper. If you have any questions, please call Estill Bamberg at [phone].       Benign Prostatic Hyperplasia  Benign prostatic hyperplasia (BPH) is an enlarged prostate gland that is caused by the normal aging process and not by cancer. The prostate is a walnut-sized gland that is involved in the production of semen. It is located in front of the rectum and below the bladder. The bladder stores urine and the urethra is the tube that carries the urine out of the body. The prostate may get bigger as a man gets older. An enlarged prostate can press on the urethra. This can make it harder to pass urine. The build-up of urine in the bladder can cause infection. Back pressure and infection may progress to bladder damage and kidney (renal) failure. What are the causes? This condition is part of a normal aging process. However, not all men develop problems from this condition. If the prostate enlarges away from the urethra, urine flow will not be blocked. If it enlarges toward the urethra and compresses it, there will be problems passing urine. What increases the risk? This condition is more likely to develop in men over the age of 40 years. What are the signs or symptoms? Symptoms of this condition include:  Getting up often during the night to urinate.  Needing to urinate frequently during the day.  Difficulty starting urine flow.  Decrease in size and strength of your urine  stream.  Leaking (dribbling) after urinating.  Inability to pass urine. This needs immediate treatment.  Inability to completely empty your bladder.  Pain when you pass urine. This is more common if there is also an infection.  Urinary tract infection (UTI). How is this diagnosed? This condition is diagnosed based on your medical history, a physical exam, and your symptoms. Tests will also be done, such as:  A post-void bladder scan. This measures any amount of urine that may remain in your bladder after you finish urinating.  A digital rectal exam. In a rectal exam, your health care provider checks your prostate by putting a lubricated, gloved finger into your rectum to feel the back of your prostate gland. This exam detects the size of your gland and any abnormal lumps or growths.  An exam of your urine (urinalysis).  A prostate specific antigen (PSA) screening. This is a blood test used to screen for prostate cancer.  An ultrasound. This test uses sound waves to electronically produce a picture of your prostate gland. Your health care provider may refer you to a specialist in kidney and prostate diseases (urologist). How is this treated? Once symptoms begin, your health care provider will monitor your condition (active surveillance or watchful waiting). Treatment for this condition will depend on the severity of your condition. Treatment may include:  Observation and yearly exams. This may be the only treatment needed if your condition and symptoms are mild.  Medicines to relieve your symptoms, including: ? Medicines  to shrink the prostate. ? Medicines to relax the muscle of the prostate.  Surgery in severe cases. Surgery may include: ? Prostatectomy. In this procedure, the prostate tissue is removed completely through an open incision or with a laparoscope or robotics. ? Transurethral resection of the prostate (TURP). In this procedure, a tool is inserted through the opening at  the tip of the penis (urethra). It is used to cut away tissue of the inner core of the prostate. The pieces are removed through the same opening of the penis. This removes the blockage. ? Transurethral incision (TUIP). In this procedure, small cuts are made in the prostate. This lessens the prostate's pressure on the urethra. ? Transurethral microwave thermotherapy (TUMT). This procedure uses microwaves to create heat. The heat destroys and removes a small amount of prostate tissue. ? Transurethral needle ablation (TUNA). This procedure uses radio frequencies to destroy and remove a small amount of prostate tissue. ? Interstitial laser coagulation (Twining). This procedure uses a laser to destroy and remove a small amount of prostate tissue. ? Transurethral electrovaporization (TUVP). This procedure uses electrodes to destroy and remove a small amount of prostate tissue. ? Prostatic urethral lift. This procedure inserts an implant to push the lobes of the prostate away from the urethra. Follow these instructions at home:  Take over-the-counter and prescription medicines only as told by your health care provider.  Monitor your symptoms for any changes. Contact your health care provider with any changes.  Avoid drinking large amounts of liquid before going to bed or out in public.  Avoid or reduce how much caffeine or alcohol you drink.  Give yourself time when you urinate.  Keep all follow-up visits as told by your health care provider. This is important. Contact a health care provider if:  You have unexplained back pain.  Your symptoms do not get better with treatment.  You develop side effects from the medicine you are taking.  Your urine becomes very dark or has a bad smell.  Your lower abdomen becomes distended and you have trouble passing your urine. Get help right away if:  You have a fever or chills.  You suddenly cannot urinate.  You feel lightheaded, or very dizzy, or you  faint.  There are large amounts of blood or clots in the urine.  Your urinary problems become hard to manage.  You develop moderate to severe low back or flank pain. The flank is the side of your body between the ribs and the hip. These symptoms may represent a serious problem that is an emergency. Do not wait to see if the symptoms will go away. Get medical help right away. Call your local emergency services (911 in the [place] Do not drive yourself to the hospital. Summary  Benign prostatic hyperplasia (BPH) is an enlarged prostate that is caused by the normal aging process and not by cancer.  An enlarged prostate can press on the urethra. This can make it hard to pass urine.  This condition is part of a normal aging process and is more likely to develop in men over the age of 68 years.  Get help right away if you suddenly cannot urinate. This information is not intended to replace advice given to you by your health care provider. Make sure you discuss any questions you have with your health care provider. Document Revised: 03/16/2018 Document Reviewed: 05/26/2016 Elsevier Patient Education  2020 [employer].

## 2020-04-05 NOTE — Progress Notes (Signed)
Bladder Scan Patient can void: 500 ml Performed By: Durenda Guthrie, lpn

## 2020-04-05 NOTE — Telephone Encounter (Signed)
-----   Message from Antonieta Iba, RN sent at 04/05/2020 11:49 AM EST -----  ----- Message ----- From: Sueanne Margarita, MD Sent: 04/05/2020  11:38 AM EST To: Dorisann Frames, RN, Antonieta Iba, RN  [place] please get patient a visit in office with PA prior to surgery for cardiac clearance to make sure he is maintaining NSR  Traci ----- Message ----- From: Dorisann Frames, RN Sent: 04/05/2020  10:34 AM EST To: Sueanne Margarita, MD

## 2020-04-05 NOTE — Progress Notes (Signed)
Fill and Pull Catheter Removal  Patient is present today for a catheter removal.  Patient was cleaned and prepped and attempted to instill 17ml of sterile water/ saline into the bladder. Sterile water would not go in. Attempted again got 30 ml in and it came back w/ urine. Spoke with Dr. Alyson Ingles and he ordered cath pulled and pt come back at 1:30. Water was then drained from the balloon.  A 16FR foley cath was removed from the bladder.     Performed by: Antionette Char, Sreeja Spies,LPN

## 2020-04-05 NOTE — Progress Notes (Signed)
04/05/2020 9:14 AM   Johnny [name] [birth year] [id number]  Referring provider: [name], MD [address] [place],  [place] [address]  Urinary retention  HPI: Johnny [name] is a 64yo here for followup for BPH. He developed urinary retention on 11/23 and had a foley placed in the ER. CT from 11/23 revealed at 212cc prostate. He is currently on uroxatral 10mg  qhs. He was having moderate LUTS prior to presenting to the ER.   PMH: Past Medical History:  Diagnosis Date  . Atrial fibrillation ([place])   . CKD (chronic kidney disease) stage 3, GFR 30-59 ml/min ([place]) 06/10/2017  . Constipation 03/27/2020  . Diabetes mellitus   . Dyslipidemia 08/20/2018  . Essential hypertension 08/03/2007   Qualifier: Diagnosis of  By: Linda Hedges MD, Heinz Knuckles  Med ARB, diuretic    . Guttate psoriasis   . HTN (hypertension)   . Hyperlipidemia   . Hyperlipidemia associated with type 2 diabetes mellitus ([place]) 08/03/2007   Qualifier: Diagnosis of  By: Linda Hedges MD, Heinz Knuckles  meds - none   . 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 Sleep apnea   . Type II diabetes mellitus with renal manifestations, uncontrolled ([place]) 08/03/2007   Qualifier: Diagnosis of  By: Linda Hedges MD, Heinz Knuckles  Med metformin     Surgical History: Past Surgical History:  Procedure Laterality Date  . CARDIOVERSION N/A 07/18/2019   Procedure: CARDIOVERSION;  Surgeon: Dorothy Spark, MD;  Location: [facility] ENDOSCOPY;  Service: Cardiovascular;  Laterality: N/A;  . TEE WITHOUT CARDIOVERSION N/A 07/18/2019   Procedure: TRANSESOPHAGEAL ECHOCARDIOGRAM (TEE);  Surgeon: Dorothy Spark, MD;  Location: [facility] ENDOSCOPY;  Service: Cardiovascular;  Laterality: N/A;  . TOOTH EXTRACTION      Home Medications:  Allergies as of 04/05/2020   No Known Allergies     Medication List       Accurate as of April 05, 2020  9:14 AM. If you have any questions, ask your nurse or doctor.        alfuzosin  10 MG 24 hr tablet Commonly known as: UROXATRAL Take 1 tablet (10 mg total) by mouth daily with breakfast.   ammonium lactate 12 % lotion Commonly known as: AmLactin Apply 1 application topically as needed for dry skin.   apixaban 5 MG Tabs tablet Commonly known as: ELIQUIS Take 1 tablet (5 mg total) by mouth 2 (two) times daily.   atorvastatin 80 MG tablet Commonly known as: LIPITOR Take 1 tablet (80 mg total) by mouth daily.   carvedilol 25 MG tablet Commonly known as: COREG Take 1 tablet (25 mg total) by mouth 2 (two) times daily with a meal.   furosemide 20 MG tablet Commonly known as: LASIX Take 1 tablet (20 mg total) by mouth daily.   glipiZIDE 5 MG tablet Commonly known as: GLUCOTROL Take 1 tablet (5 mg total) by mouth 2 (two) times daily before a meal.   isosorbide-hydrALAZINE 20-37.5 MG tablet Commonly known as: BIDIL Take 1 tablet by mouth 3 (three) times daily.   metFORMIN 500 MG 24 hr tablet Commonly known as: GLUCOPHAGE-XR TAKE 1 TABLET BY MOUTH TWICE A DAY What changed: when to take this   olmesartan-hydrochlorothiazide 40-12.5 MG tablet Commonly known as: BENICAR HCT TAKE 1 TABLET BY MOUTH EVERY DAY   Trulicity 1.5 ID/7.8EU Sopn Generic drug: Dulaglutide INJECT 1.5 MG INTO THE SKIN ONCE A WEEK.       Allergies: No  Known Allergies  Family History: Family History  Problem Relation Age of Onset  . Hypertension Father        DOB [birth year]  . Hyperlipidemia Father   . Diabetes Father   . Coronary artery disease Father        with stents  . Nephrolithiasis Father   . Kidney disease Father        s/p rejected kidney transplant; on HD  . Memory loss Mother        DOB 63  . Kidney disease Mother        s/p nephrectomy, infection problem  . Dementia Mother   . Cancer Brother        lung  . HIV Brother   . Prostate cancer Neg Hx   . Colon cancer Neg Hx     Social History:  reports that he is a non-smoker but has been exposed to tobacco  smoke. He has never used smokeless tobacco. He reports that he does not drink alcohol and does not use drugs.  ROS: All other review of systems were reviewed and are negative except what is noted above in HPI  Physical Exam: BP (!) 193/81   Pulse 80   Temp 98.9 F (37.2 C)   Ht 6\' 5"  (1.956 m)   Wt (!) 315 lb (142.9 kg)   BMI 37.35 kg/m   Constitutional:  Alert and oriented, No acute distress. HEENT: [place] AT, moist mucus membranes.  Trachea midline, no masses. Cardiovascular: No clubbing, cyanosis, or edema. Respiratory: Normal respiratory effort, no increased work of breathing. GI: Abdomen is soft, nontender, nondistended, no abdominal masses GU: No CVA tenderness.  Lymph: No cervical or inguinal lymphadenopathy. Skin: No rashes, bruises or suspicious lesions. Neurologic: Grossly intact, no focal deficits, moving all 4 extremities. Psychiatric: Normal mood and affect.  Laboratory Data: Lab Results  Component Value Date   WBC 6.6 03/28/2020   HGB 13.7 03/28/2020   HCT 42.4 03/28/2020   MCV 87.6 03/28/2020   PLT 235 03/28/2020    Lab Results  Component Value Date   CREATININE 1.97 (H) 03/28/2020    Lab Results  Component Value Date   PSA 11.14 (H) 11/19/2012   PSA 7.47 (H) 11/04/2011   PSA 3.43 04/23/2009    No results found for: TESTOSTERONE  Lab Results  Component Value Date   HGBA1C 6.9 (A) 02/03/2020    Urinalysis    Component Value Date/Time   COLORURINE YELLOW 03/28/2020 1805   APPEARANCEUR HAZY (A) 03/28/2020 1805   LABSPEC 1.016 03/28/2020 1805   PHURINE 5.0 03/28/2020 1805   GLUCOSEU NEGATIVE 03/28/2020 1805   GLUCOSEU >=1000 04/23/2009 0911   HGBUR LARGE (A) 03/28/2020 1805   BILIRUBINUR NEGATIVE 03/28/2020 1805   BILIRUBINUR moderate 11/04/2019 1419   KETONESUR NEGATIVE 03/28/2020 1805   PROTEINUR 100 (A) 03/28/2020 1805   UROBILINOGEN negative (A) 11/04/2019 1419   UROBILINOGEN 0.2 04/23/2009 0911   NITRITE NEGATIVE 03/28/2020 1805    LEUKOCYTESUR NEGATIVE 03/28/2020 1805    Lab Results  Component Value Date   BACTERIA NONE SEEN 03/28/2020    Pertinent Imaging: CT 11/23: Images reviewed and discussed with the patient  Results for orders placed during the hospital encounter of 09/20/19  DG Abdomen 1 View  Narrative CLINICAL DATA:  Constipation and trouble breathing  EXAM: ABDOMEN - 1 VIEW  COMPARISON:  None.  FINDINGS: The bowel gas pattern is normal. No abnormal stool retention. Pelvic calcifications on the right have the appearance of phlebolith.  No abnormal osseous findings.  IMPRESSION: Normal bowel gas pattern.  No abnormal stool retention.   Electronically Signed By: Monte Fantasia M.D. On: 09/20/2019 05:09  No results found for this or any previous visit.  No results found for this or any previous visit.  No results found for this or any previous visit.  No results found for this or any previous visit.  No results found for this or any previous visit.  No results found for this or any previous visit.  No results found for this or any previous visit.   Assessment & Plan:    1. BPH with urinary obstruction -We will increase uroxtral to 10mg  BID. We discussed the management of BPH including medical therapy, indwelling foley, CIC, TURP, rezum, urolift and simple prostatectomy. Given he has a 212cc prostate on recent CT he would benefit from simple prostatectomy. After discussing the treatment options the patient wishes to proceed with robotic simple prostatectomy. Risks/benefits/alternatives discussed.   2. Urinary retention -increase uroxatral to BID   No follow-ups on file.  Nicolette Bang, MD  [facility] [place]

## 2020-04-05 NOTE — Telephone Encounter (Signed)
-----   Message from Sueanne Margarita, MD sent at 04/05/2020 11:38 AM EST ----- Percival Spanish please get patient a visit in office with PA prior to surgery for cardiac clearance to make sure he is maintaining NSR  Traci ----- Message ----- From: Dorisann Frames, RN Sent: 04/05/2020  10:34 AM EST To: Sueanne Margarita, MD

## 2020-04-05 NOTE — Telephone Encounter (Signed)
° °  [facility] Pre-operative Risk Assessment    HEARTCARE STAFF: - Please ensure there is not already an duplicate clearance open for this procedure. - Under Visit Info/Reason for Call, type in Other and utilize the format Clearance MM/DD/YY or Clearance TBD. Do not use dashes or single digits. - If request is for dental extraction, please clarify the # of teeth to be extracted.  Request for surgical clearance:  1. What type of surgery is being performed? ROBOTIC SUPRAPUBIC PROSTATECTOMY    2. When is this surgery scheduled? 05/31/20   3. What type of clearance is required (medical clearance vs. Pharmacy clearance to hold med vs. Both)? BOTH  4. Are there any medications that need to be held prior to surgery and how long? ELIQUIS x 2 DAYS PRIOR TO PROCEDURE   5. Practice name and name of physician performing surgery? [facility]; DR. Littlerock   6. What is the office phone number? [phone]   7.   What is the office fax number? [phone]  8.   Anesthesia type (None, local, MAC, general) ? NOT LISTED (CHOICE)   Julaine Hua 04/05/2020, 12:15 PM  _________________________________________________________________   (provider comments below)

## 2020-04-05 NOTE — Progress Notes (Signed)
Surgical clearance faxed to Dr. Radford Pax.

## 2020-04-06 ENCOUNTER — Encounter: Payer: Self-pay | Admitting: Internal Medicine

## 2020-04-06 ENCOUNTER — Telehealth: Payer: Self-pay | Admitting: Cardiology

## 2020-04-06 ENCOUNTER — Ambulatory Visit (INDEPENDENT_AMBULATORY_CARE_PROVIDER_SITE_OTHER): Payer: BC Managed Care – PPO | Admitting: Internal Medicine

## 2020-04-06 VITALS — BP 180/86 | HR 79 | Temp 99.3°F | Wt 306.2 lb

## 2020-04-06 DIAGNOSIS — I4892 Unspecified atrial flutter: Secondary | ICD-10-CM

## 2020-04-06 DIAGNOSIS — E1142 Type 2 diabetes mellitus with diabetic polyneuropathy: Secondary | ICD-10-CM | POA: Diagnosis not present

## 2020-04-06 DIAGNOSIS — I10 Essential (primary) hypertension: Secondary | ICD-10-CM | POA: Diagnosis not present

## 2020-04-06 NOTE — Patient Instructions (Signed)
Keep the follow up with the urologist to get the prostate taken care of.

## 2020-04-06 NOTE — Progress Notes (Signed)
   Subjective:   Patient ID: Johnny [name], male    DOB: 1955-10-16, 64 y.o.   MRN: [id number]  HPI The patient is a 64 YO man coming in for hospital follow up (in with A fib rvr s/p cardioversion, with urinary retention from BPH). Discharged with catheter and seeing urology. They are going to do procedure in January and he will need catheter until then. He is having some elevated BP readings while adjusting catheter and during stay. Taking meds as previous and previously at goal on same.   PMH, [facility], social history reviewed and updated  Review of Systems  Constitutional: Negative.   HENT: Negative.   Eyes: Negative.   Respiratory: Negative for cough, chest tightness and shortness of breath.   Cardiovascular: Negative for chest pain, palpitations and leg swelling.  Gastrointestinal: Negative for abdominal distention, abdominal pain, constipation, diarrhea, nausea and vomiting.  Genitourinary: Positive for difficulty urinating.  Musculoskeletal: Negative.   Skin: Negative.   Neurological: Negative.   Psychiatric/Behavioral: Negative.     Objective:  Physical Exam Constitutional:      Appearance: He is well-developed.  HENT:     Head: Normocephalic and atraumatic.  Cardiovascular:     Rate and Rhythm: Normal rate and regular rhythm.  Pulmonary:     Effort: Pulmonary effort is normal. No respiratory distress.     Breath sounds: Normal breath sounds. No wheezing or rales.  Abdominal:     General: Bowel sounds are normal. There is no distension.     Palpations: Abdomen is soft.     Tenderness: There is no abdominal tenderness. There is no rebound.  Musculoskeletal:     Cervical back: Normal range of motion.  Skin:    General: Skin is warm and dry.  Neurological:     Mental Status: He is alert and oriented to person, place, and time.     Coordination: Coordination normal.     Vitals:   04/06/20 1525  BP: (!) 180/86  Pulse: 79  Temp: 99.3 F (37.4 C)  TempSrc: Oral   SpO2: 96%  Weight: (!) 306 lb 3.2 oz (138.9 kg)    This visit occurred during the SARS-CoV-2 public health emergency.  Safety protocols were in place, including screening questions prior to the visit, additional usage of staff PPE, and extensive cleaning of exam room while observing appropriate contact time as indicated for disinfecting solutions.   Assessment & Plan:

## 2020-04-06 NOTE — Assessment & Plan Note (Signed)
BP at goal prior to hospital stay and will leave regimen the same for now.

## 2020-04-06 NOTE — Assessment & Plan Note (Signed)
Doing much better with recent HgA1c 6.9

## 2020-04-06 NOTE — Assessment & Plan Note (Signed)
Appears to be in sinus today. HR normal.

## 2020-04-06 NOTE — Telephone Encounter (Signed)
Patient with diagnosis of A Flutter on Eliquis for anticoagulation.    Procedure: ROBOTIC SUPRAPUBIC PROSTATECTOMY   Date of procedure: 05/31/20   CHA2DS2-VASc Score = 2  This indicates a 2.2% annual risk of stroke. The patient's score is based upon: CHF History: 0 HTN History: 1 Diabetes History: 1 Stroke History: 0 Vascular Disease History: 0 Age Score: 0 Gender Score: 0     CrCl 61 mL/min using adjusted body weight Platelet count 235K  Per office protocol, patient can hold Eliquis for 2-3 days prior to procedure.   Patient will not need bridging with Lovenox (enoxaparin) around procedure.

## 2020-04-06 NOTE — Telephone Encounter (Signed)
Johnny [name] is calling requesting to setup a sleep test in order for him to get a CPAP machine. Please advise.

## 2020-04-06 NOTE — Telephone Encounter (Signed)
LM2CB needs appt for pre-op clearance

## 2020-04-06 NOTE — Telephone Encounter (Signed)
Primary Cardiologist:Traci Turner, MD  Chart reviewed as part of pre-operative protocol coverage. Because of Johnny [name]'s past medical history and time since last visit, he/she will require a follow-up visit in order to better assess preoperative cardiovascular risk.  Pre-op covering staff: - Please schedule appointment and call patient to inform them. - Please contact requesting surgeon's office via preferred method (i.e, phone, fax) to inform them of need for appointment prior to surgery.  If applicable, this message will also be routed to pharmacy pool and/or primary cardiologist for input on holding anticoagulant/antiplatelet agent as requested below so that this information is available at time of patient's appointment.   Deberah Pelton, NP  04/06/2020, 12:11 PM

## 2020-04-09 NOTE — Telephone Encounter (Signed)
OV schedule 05/11/2020 with Dr Radford Pax

## 2020-04-13 ENCOUNTER — Other Ambulatory Visit: Payer: Self-pay | Admitting: Internal Medicine

## 2020-04-13 NOTE — Addendum Note (Signed)
Addended by: Freada Bergeron on: 04/13/2020 06:52 PM   Modules accepted: Orders

## 2020-04-14 ENCOUNTER — Other Ambulatory Visit: Payer: Self-pay | Admitting: Internal Medicine

## 2020-04-16 NOTE — Telephone Encounter (Signed)
Patient calling for status of refill  

## 2020-04-24 NOTE — Telephone Encounter (Signed)
error 

## 2020-05-07 ENCOUNTER — Other Ambulatory Visit: Payer: Self-pay

## 2020-05-07 ENCOUNTER — Ambulatory Visit: Payer: BC Managed Care – PPO | Attending: Cardiology | Admitting: Cardiology

## 2020-05-07 DIAGNOSIS — G4733 Obstructive sleep apnea (adult) (pediatric): Secondary | ICD-10-CM | POA: Diagnosis present

## 2020-05-07 DIAGNOSIS — G4736 Sleep related hypoventilation in conditions classified elsewhere: Secondary | ICD-10-CM | POA: Insufficient documentation

## 2020-05-08 NOTE — Procedures (Signed)
   Patient Name: Seraphim, Trow Date: 05/07/2020 Gender: Male D.O.B: [birth year] Age (years): 24 Referring Provider: Daune Perch NP Height (inches): 77 Interpreting Physician: Fransico Him MD, ABSM Weight (lbs): 311 RPSGT: Rosebud Poles BMI: 37 MRN: [id number] Neck Size: 19.00  CLINICAL INFORMATION The patient is referred for a BiPAP titration to treat sleep apnea.  SLEEP STUDY TECHNIQUE As per the AASM Manual for the Scoring of Sleep and Associated Events v2.3 (April 2016) with a hypopnea requiring 4% desaturations.  The channels recorded and monitored were frontal, central and occipital EEG, electrooculogram (EOG), submentalis EMG (chin), nasal and oral airflow, thoracic and abdominal wall motion, anterior tibialis EMG, snore microphone, electrocardiogram, and pulse oximetry. Bilevel positive airway pressure (BPAP) was initiated at the beginning of the study and titrated to treat sleep-disordered breathing.  MEDICATIONS Medications self-administered by patient taken the night of the study : N/A  RESPIRATORY PARAMETERS Optimal IPAP Pressure (cm): N/A  AHI at Optimal Pressure (/hr) N/A Optimal EPAP Pressure (cm):N/A  Overall Minimal O2 (%):79.0  Minimal O2 at Optimal Pressure (%): 79.0  SLEEP ARCHITECTURE Start Time:11:09:28 PM  Stop Time:5:59:53 AM  Total Time (min):410.4  Total Sleep Time (min):204.5 Sleep Latency (min):20.3  Sleep Efficiency (%):49.8%  REM Latency (min):228.0  WASO (min): 185.7 Stage N1 (%):14.4%  Stage N2 (%): 78.0%  Stage N3 (%): 3.4%  Stage R (%): 4.2 Supine (%):98.78  Arousal Index (/hr):9.1   CARDIAC DATA The 2 lead EKG demonstrated sinus rhythm. The mean heart rate was 71.5 beats per minute. Other EKG findings include: PVCs and PACs.  LEG MOVEMENT DATA The total Periodic Limb Movements of Sleep (PLMS) were 0. The PLMS index was 0.0. A PLMS index of <15 is considered normal in adults.  IMPRESSIONS - An optimal PAP pressure could  not be selected for this patient based on the available study data. - Mild Central Sleep Apnea was noted during this titration (CAI = 12.6/h). - Severe oxygen desaturations were observed during this titration (min O2 = 79.0%). - The patient snored with soft snoring volume. - 2-lead EKG demonstrated: PVCs and PACs - Clinically significant periodic limb movements were not noted during this study. Arousals associated with PLMs were rare.  DIAGNOSIS - Obstructive Sleep Apnea (G47.33) - Nocturnal Hypoxemia  RECOMMENDATIONS - Recommend a trial of Auto-BiPAP with IPAP max 20cm H2O, IPAP min 6cm H2O and PS 5cm H2O, heated humidity and mask of choice. - Avoid alcohol, sedatives and other CNS depressants that may worsen sleep apnea and disrupt normal sleep architecture. - Sleep hygiene should be reviewed to assess factors that may improve sleep quality. - Weight management and regular exercise should be initiated or continued. - Return to Sleep Center for re-evaluation after 8 weeks of therapy - Recommend overnight pulse ox on BiPAP to assess for persistent nocturnal hypoxemia.   [Electronically signed] 05/08/2020 09:03 AM  Fransico Him MD, ABSM Diplomate, American Board of Sleep Medicine

## 2020-05-09 ENCOUNTER — Ambulatory Visit (INDEPENDENT_AMBULATORY_CARE_PROVIDER_SITE_OTHER): Payer: BC Managed Care – PPO

## 2020-05-09 ENCOUNTER — Other Ambulatory Visit: Payer: Self-pay

## 2020-05-09 DIAGNOSIS — N401 Enlarged prostate with lower urinary tract symptoms: Secondary | ICD-10-CM | POA: Diagnosis not present

## 2020-05-09 DIAGNOSIS — N138 Other obstructive and reflux uropathy: Secondary | ICD-10-CM

## 2020-05-09 NOTE — Progress Notes (Signed)
Cath Change/ Replacement  Patient is present today for a catheter change due to urinary retention.  69ml of water was removed from the balloon, a 18FR foley cath was removed with out difficulty.  Patient was cleaned and prepped in a sterile fashion with betadine. A 18 FR foley cath was replaced into the bladder no complications were noted Urine return was noted  65ml and urine was red in color. The balloon was filled with 19ml of sterile water. A leg bag was attached for drainage.  A night bag was also given to the patient and patient was given instruction on how to change from one bag to another. Patient was given proper instruction on catheter care.    Performed by: Antionette Char, Omarie Parcell,LPN

## 2020-05-10 ENCOUNTER — Ambulatory Visit: Payer: BC Managed Care – PPO | Admitting: Urology

## 2020-05-11 ENCOUNTER — Ambulatory Visit: Payer: BC Managed Care – PPO | Admitting: Cardiology

## 2020-05-11 ENCOUNTER — Encounter: Payer: Self-pay | Admitting: Cardiology

## 2020-05-11 ENCOUNTER — Ambulatory Visit: Payer: BC Managed Care – PPO | Admitting: Urology

## 2020-05-11 ENCOUNTER — Other Ambulatory Visit: Payer: Self-pay

## 2020-05-11 VITALS — BP 158/84 | HR 81 | Ht 77.0 in | Wt 309.2 lb

## 2020-05-11 DIAGNOSIS — I4892 Unspecified atrial flutter: Secondary | ICD-10-CM | POA: Diagnosis not present

## 2020-05-11 DIAGNOSIS — E1169 Type 2 diabetes mellitus with other specified complication: Secondary | ICD-10-CM | POA: Diagnosis not present

## 2020-05-11 DIAGNOSIS — I10 Essential (primary) hypertension: Secondary | ICD-10-CM | POA: Diagnosis not present

## 2020-05-11 DIAGNOSIS — E119 Type 2 diabetes mellitus without complications: Secondary | ICD-10-CM | POA: Diagnosis not present

## 2020-05-11 DIAGNOSIS — E785 Hyperlipidemia, unspecified: Secondary | ICD-10-CM

## 2020-05-11 DIAGNOSIS — I5022 Chronic systolic (congestive) heart failure: Secondary | ICD-10-CM

## 2020-05-11 DIAGNOSIS — G4733 Obstructive sleep apnea (adult) (pediatric): Secondary | ICD-10-CM

## 2020-05-11 NOTE — Progress Notes (Signed)
Cardiology Office Consult Note    Date:  05/11/2020   ID:  Johnny [name], DOB [birth year], MRN [id number]  PCP:  [name], MD  Cardiologist:  Fransico Him, MD   Chief Complaint  Patient presents with  . Atrial Flutter  . Hypertension  . Hyperlipidemia  . Cardiomyopathy    History of Present Illness:  Johnny [name] is a 65 y.o. male  with a hx of CKD stage 3b, DM2, HTN, HLD, chronic systolic CHF (EF 7/82/9562 was 25-30% but resolved on echo 08/2019 to 60-65% and was felt to be due to tachy induced DCM).  He also has a hx of medical noncompliance.  His last 2D echo 08/2019 showed normal LVF with mild LVH and moderate biatrial enlargement.   When I first saw him he was in atrial flutter with RVR and was hospitalized and  started on Eliquis 5mg  BID for a CHADS2VASC score of 2.  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  He then presented to ER in Nov 2021 with constipation and was found to be in atrial flutter with RVR and was started on IV Cardizem and cardioverted to NSR.  He did not have any palpitations at that time. He was continued on Carvedilol and Eliquis.  It was felt that he may have OSA that was triggering his aflutter and underwent sleep study which showed severe OSA with an AHI of 92/hr and nocturnal hypoxemia with O2 sats as low as 85%.  He underwent CPAP titration but due to ongoing events he was transitioned to auto BiPAP but has not received his device yet.  His BiPAP titration was on 05/07/2020.  He is here today for followup and is doing well.  He needs preop cardiac clearance for He denies any chest pain or pressure, SOB, DOE, PND, orthopnea, LE edema, dizziness, palpitations or syncope. He is compliant with his meds and is tolerating meds with no SE.    Past Medical History:  Diagnosis Date  . CKD (chronic kidney disease) stage 3, GFR 30-59 ml/min ([place]) 06/10/2017  . Constipation 03/27/2020  . Diabetes mellitus    . Essential hypertension 08/03/2007   Qualifier: Diagnosis of  By: Linda Hedges MD, Heinz Knuckles  Med ARB, diuretic    . Guttate psoriasis   . Hyperlipidemia associated with type 2 diabetes mellitus ([place]) 08/03/2007   Qualifier: Diagnosis of  By: Linda Hedges MD, Heinz Knuckles  meds - none   . Paroxysmal atrial flutter ([place])    S/P TEE/DCCV 07/2019  . 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 Sleep apnea   . Type II diabetes mellitus with renal manifestations, uncontrolled ([place]) 08/03/2007   Qualifier: Diagnosis of  By: Linda Hedges MD, Heinz Knuckles  Med metformin     Past Surgical History:  Procedure Laterality Date  . CARDIOVERSION N/A 07/18/2019   Procedure: CARDIOVERSION;  Surgeon: Dorothy Spark, MD;  Location: [facility] ENDOSCOPY;  Service: Cardiovascular;  Laterality: N/A;  . TEE WITHOUT CARDIOVERSION N/A 07/18/2019   Procedure: TRANSESOPHAGEAL ECHOCARDIOGRAM (TEE);  Surgeon: Dorothy Spark, MD;  Location: [facility] ENDOSCOPY;  Service: Cardiovascular;  Laterality: N/A;  . TOOTH EXTRACTION      Current Medications: Current Meds  Medication Sig  . alfuzosin (UROXATRAL) 10 MG 24 hr tablet Take 1 tablet (10 mg total) by mouth daily with breakfast.  . ammonium lactate (AMLACTIN) 12 % lotion Apply 1 application  topically as needed for dry skin.  Marland Kitchen apixaban (ELIQUIS) 5 MG TABS tablet Take 1 tablet (5 mg total) by mouth 2 (two) times daily.  Marland Kitchen atorvastatin (LIPITOR) 80 MG tablet Take 1 tablet (80 mg total) by mouth daily.  . carvedilol (COREG) 25 MG tablet Take 1 tablet (25 mg total) by mouth 2 (two) times daily with a meal.  . finasteride (PROSCAR) 5 MG tablet Take 1 tablet (5 mg total) by mouth daily.  Marland Kitchen glipiZIDE (GLUCOTROL) 5 MG tablet Take 1 tablet (5 mg total) by mouth 2 (two) times daily before a meal.  . isosorbide-hydrALAZINE (BIDIL) 20-37.5 MG tablet Take 1 tablet by mouth 3 (three) times daily.  . metFORMIN (GLUCOPHAGE-XR) 500 MG 24 hr tablet TAKE 1 TABLET BY MOUTH  TWICE A DAY (Patient taking differently: Take 500 mg by mouth daily with breakfast.)  . olmesartan-hydrochlorothiazide (BENICAR HCT) 40-12.5 MG tablet TAKE 1 TABLET BY MOUTH EVERY DAY  . TRULICITY 1.5 NW/2.9FA SOPN INJECT 1.5 MG INTO THE SKIN ONCE A WEEK.    Allergies:   Patient has no known allergies.   Social History   Socioeconomic History  . Marital status: Married    Spouse name: Not on file  . Number of children: 2  . Years of education: 35  . Highest education level: Not on file  Occupational History  . Occupation: mfg  Tobacco Use  . Smoking status: Passive Smoke Exposure - Never Smoker  . Smokeless tobacco: Never Used  Vaping Use  . Vaping Use: Never used  Substance and Sexual Activity  . Alcohol use: No  . Drug use: No  . Sexual activity: Yes  Other Topics Concern  . Not on file  Social History Narrative   [employer]. [employer]. [employer] x 4 years. Married '94. 2 sons - '95, '96. Wife is healthy. Work - Banker.   Social Determinants of Radio broadcast assistant Strain: Not on file  Food Insecurity: Not on file  Transportation Needs: Not on file  Physical Activity: Not on file  Stress: Not on file  Social Connections: Not on file     Family History:  The patient's family history includes Cancer in his brother; Coronary artery disease in his father; Dementia in his mother; Diabetes in his father; HIV in his brother; Hyperlipidemia in his father; Hypertension in his father; Kidney disease in his father and mother; Memory loss in his mother; Nephrolithiasis in his father.   ROS:   Please see the history of present illness.    ROS All other systems reviewed and are negative.  No flowsheet data found.  PHYSICAL EXAM:   VS:  BP (!) 158/84   Pulse 81   Ht 6\' 5"  (1.956 m)   Wt (!) 309 lb 3.2 oz (140.3 kg)   SpO2 96%   BMI 36.67 kg/m     GEN: Well nourished, well developed in no acute distress HEENT: Normal NECK: No JVD; No carotid bruits LYMPHATICS: No  lymphadenopathy CARDIAC:RRR, no murmurs, rubs, gallops RESPIRATORY:  Clear to auscultation without rales, wheezing or rhonchi  ABDOMEN: Soft, non-tender, non-distended MUSCULOSKELETAL:  No edema; No deformity  SKIN: Warm and dry NEUROLOGIC:  Alert and oriented x 3 PSYCHIATRIC:  Normal affect    Wt Readings from Last 3 Encounters:  05/11/20 (!) 309 lb 3.2 oz (140.3 kg)  04/06/20 (!) 306 lb 3.2 oz (138.9 kg)  04/05/20 (!) 315 lb (142.9 kg)      Studies/Labs Reviewed:   EKG:  EKG is not ordered today.  2D echo 08/2019 IMPRESSIONS   1. Left ventricular ejection fraction, by estimation, is 60 to 65%. The  left ventricle has normal function. The left ventricle has no regional  wall motion abnormalities. There is mild concentric left ventricular  hypertrophy. Left ventricular diastolic  parameters are indeterminate.  2. Right ventricular systolic function is normal. The right ventricular  size is mildly enlarged. There is normal pulmonary artery systolic  pressure.  3. Left atrial size was moderately dilated.  4. Right atrial size was moderately dilated.  5. The mitral valve is normal in structure. No evidence of mitral valve  regurgitation. No evidence of mitral stenosis.  6. The aortic valve is normal in structure. Aortic valve regurgitation is  not visualized. No aortic stenosis is present.  7. Aortic dilatation noted. There is mild dilatation at the level of the  sinuses of Valsalva measuring 40 mm.  8. The inferior vena cava is normal in size with greater than 50%  respiratory variability, suggesting right atrial pressure of 3 mmHg.   Recent Labs: 07/14/2019: TSH 1.579 03/28/2020: ALT 57; BUN 24; Creatinine, Ser 1.97; Hemoglobin 13.7; Magnesium 2.0; Platelets 235; Potassium 3.2; Sodium 144   Lipid Panel    Component Value Date/Time   CHOL 123 09/05/2019 1101   TRIG 63 09/05/2019 1101   TRIG 63 05/01/2006 1329   HDL 31 (L) 09/05/2019 1101   CHOLHDL 4.0  09/05/2019 1101   CHOLHDL 6.3 07/15/2019 0408   VLDL 13 07/15/2019 0408   LDLCALC 79 09/05/2019 1101   LDLDIRECT 170.2 11/19/2012 1105    Additional studies/ records that were reviewed today include:  EKG, OV notes from PCP, 2D echo 07/2019 and 08/2019    ASSESSMENT:    1. Paroxysmal atrial flutter ([place])   2. Essential hypertension   3. DM type 2, goal HbA1c < 7% ([facility])   4. Hyperlipidemia associated with type 2 diabetes mellitus ([place])   5. Chronic systolic CHF (congestive heart failure) ([place])   6. OSA (obstructive sleep apnea)      PLAN:  In order of problems listed above:  1.  Paroxysmal atrial flutter -he had another reoccurrence in Nov 2021 in setting of severe constipation -he has been maintaining NSR since then with no palpitations -continue Eliquis 5mg  BID >> he denies any bleeding complications -continue Carvedilol 25mg  BID  2.  HTN -BP borderline controlled -continue Carvedilol 25mg  BID and Benicar HCT 40-12.5mg  daily -SCr 1.97 03/2020 -repeat BMET due to low K+ in Nov  3.  DM type 2 -followed by PCP -HbA1C was 6.9% in Oct 2021 -continue Metformin 500mg  BID and Glipizide 5mg  BID  4.  HLD -LDL goal < 70 with DM -LDL was 79 in May -continue atorvastatin 40mg  daily  5.  Chronic systolic CHF -related to tachy mediated DCM which has now resolved with EF normalized after restoring NSR -he does not appear volume overloaded on exam today -continue Carvedilol  6.  Preoperative Cardiac Clearance -he is having a TURP for BPH -His Revised Cardiac Risk Index is moderate at 6.6% risk of perioperative major cardiac event and this is mainly due to hx of CHF which has resolved and was in the setting of tachy mediated DCM that has resolved with restoration of aflutter.  -his functional capacity is 7.7 mets and therefore no further ischemic workup indicated -ok to hold Eliquis prior to TURP and restart when ok with Urology  7.  OSA -he has severe OSA with an  AHI of  92/hr and was titrated to auto BIPAP but has not received his device yet -I will set up an appt with DME ASAP so he has his device to take with him for post op from his surgery  -followup with me in 8 weeks after starting PAP   Medication Adjustments/Labs and Tests Ordered: Current medicines are reviewed at length with the patient today.  Concerns regarding medicines are outlined above.  Medication changes, Labs and Tests ordered today are listed in the Patient Instructions below.  There are no Patient Instructions on file for this visit.   Signed, Fransico Him, MD  05/11/2020 10:19 AM    [facility] [place], [place], [place]  [address] Phone: [phone]; Fax: [phone]

## 2020-05-11 NOTE — Patient Instructions (Signed)
Medication Instructions:  Your physician recommends that you continue on your current medications as directed. Please refer to the Current Medication list given to you today.  *If you need a refill on your cardiac medications before your next appointment, please call your pharmacy*  Follow-Up: At [facility], you and your health needs are our priority.  As part of our continuing mission to provide you with exceptional heart care, we have created designated Provider Care Teams.  These Care Teams include your primary Cardiologist (physician) and Advanced Practice Providers (APPs -  Physician Assistants and Nurse Practitioners) who all work together to provide you with the care you need, when you need it.  Follow up with Dr. Radford Pax after you get your new device.

## 2020-05-15 ENCOUNTER — Telehealth: Payer: Self-pay | Admitting: *Deleted

## 2020-05-15 DIAGNOSIS — G4733 Obstructive sleep apnea (adult) (pediatric): Secondary | ICD-10-CM

## 2020-05-15 NOTE — Telephone Encounter (Addendum)
-----   Message from Sueanne Margarita, MD sent at 05/08/2020  9:08 AM EST ----- Please let patient know that they had a successful PAP titration and let DME know that orders are in EPIC.  Please set up 8 week OV with me.   Please order they had a successful PAP titration and let DME know that orders are in EPIC. Please set up 8 week OV with me.  Please order overnight pulse ox on PAP for nocturnal hypoxemia

## 2020-05-15 NOTE — Telephone Encounter (Signed)
Informed patient of sleep study results and patient understanding was verbalized. Patient understands her sleep study showed they had a successful PAP titration and let DME know that orders are in EPIC. Please set up 8 week OV with me.  Please order overnight pulse ox on PAP for nocturnal hypoxemia    Upon patient request DME selection is [facility]. Patient understands she/he will be contacted by [facility] to set up her/he cpap. Patient understands to call if [facility] does not contact her/he with new setup in a timely manner. Patient understands they will be called once confirmation has been received from [facility] that they have received their new machine to schedule 10 week follow up appointment.   [facility] notified of new cpap order  Please add to airview Patient was grateful for the call and thanked me.

## 2020-05-16 NOTE — Telephone Encounter (Signed)
Darlina Guys at [facility] is working on getting patient his cpap before his surgery though she does not make an promises because of the Consulting civil engineer.

## 2020-05-18 ENCOUNTER — Encounter (HOSPITAL_COMMUNITY): Payer: Self-pay

## 2020-05-18 ENCOUNTER — Other Ambulatory Visit: Payer: Self-pay

## 2020-05-18 NOTE — Patient Instructions (Addendum)
DUE TO COVID-19 ONLY ONE VISITOR IS ALLOWED TO COME WITH YOU AND STAY IN THE WAITING ROOM ONLY DURING PRE OP AND PROCEDURE DAY OF SURGERY. THE 1 VISITOR  MAY VISIT WITH YOU AFTER SURGERY IN YOUR PRIVATE ROOM DURING VISITING HOURS ONLY!  YOU NEED TO HAVE A COVID 19 TEST ON: 05/28/20 @ 11:00 AM , THIS TEST MUST BE DONE BEFORE SURGERY,  COVID TESTING SITE [place] [address], IT IS ON THE RIGHT GOING OUT [address] APPROXIMATELY  2 MINUTES PAST [employer] ON THE RIGHT. ONCE YOUR COVID TEST IS COMPLETED,  PLEASE BEGIN THE QUARANTINE INSTRUCTIONS AS OUTLINED IN YOUR HANDOUT.                Johnny [name]   Your procedure is scheduled on: 05/31/20   Report to [facility] Main  Entrance   Report to admitting at: 8:30 AM     Call this number if you have problems the morning of surgery [phone]    Remember: Do not eat solid food :After Midnight. Clear liquids until: 7:30 am.  CLEAR LIQUID DIET  Foods Allowed                                                                     Foods Excluded  Coffee and tea, regular and decaf                             liquids that you cannot  Plain Jell-O any favor except red or purple                                           see through such as: Fruit ices (not with fruit pulp)                                     milk, soups, orange juice  Iced Popsicles                                    All solid food Carbonated beverages, regular and diet                                    Cranberry, grape and apple juices Sports drinks like Gatorade Lightly seasoned clear broth or consume(fat free) Sugar, honey syrup  Sample Menu Breakfast                                Lunch                                     Supper Cranberry juice  Beef broth                            Chicken broth Jell-O                                     Grape juice                           Apple juice Coffee or tea                         Jell-O                                      Popsicle                                                Coffee or tea                        Coffee or tea  _____________________________________________________________________  BRUSH YOUR TEETH MORNING OF SURGERY AND RINSE YOUR MOUTH OUT, NO CHEWING GUM CANDY OR MINTS.    Take these medicines the morning of surgery with A SIP OF WATER: carvedilol,isosorbide.  How to Manage Your Diabetes Before and After Surgery  Why is it important to control my blood sugar before and after surgery? . Improving blood sugar levels before and after surgery helps healing and can limit problems. . A way of improving blood sugar control is eating a healthy diet by: o  Eating less sugar and carbohydrates o  Increasing activity/exercise o  Talking with your doctor about reaching your blood sugar goals . High blood sugars (greater than 180 mg/dL) can raise your risk of infections and slow your recovery, so you will need to focus on controlling your diabetes during the weeks before surgery. . Make sure that the doctor who takes care of your diabetes knows about your planned surgery including the date and location.  How do I manage my blood sugar before surgery? . Check your blood sugar at least 4 times a day, starting 2 days before surgery, to make sure that the level is not too high or low. o Check your blood sugar the morning of your surgery when you wake up and every 2 hours until you get to the Short Stay unit. . If your blood sugar is less than 70 mg/dL, you will need to treat for low blood sugar: o Do not take insulin. o Treat a low blood sugar (less than 70 mg/dL) with  cup of clear juice (cranberry or apple), 4 glucose tablets, OR glucose gel. o Recheck blood sugar in 15 minutes after treatment (to make sure it is greater than 70 mg/dL). If your blood sugar is not greater than 70 mg/dL on recheck, call [phone] for further  instructions. . Report your blood sugar to the short stay nurse when you get to Short Stay.  . If you are admitted to the hospital after surgery: o Your blood sugar will be checked by the staff and you will probably be given insulin  after surgery (instead of oral diabetes medicines) to make sure you have good blood sugar levels. o The goal for blood sugar control after surgery is 80-180 mg/dL.   WHAT DO I DO ABOUT MY DIABETES MEDICATION?  Marland Kitchen Do not take oral diabetes medicines (pills) the morning of surgery.  . THE DAY BEFORE SURGERY, take Metformin and Glipizide as usual.     . THE MORNING OF SURGERY,DO NOT TAKE ANY DIABETIC MEDICATIONS DAY OF YOUR SURGERY  . The day of surgery, do not take other diabetes injectables, including Byetta (exenatide), Bydureon (exenatide ER), Victoza (liraglutide), or Trulicity (dulaglutide).                               You may not have any metal on your body including hair pins and              piercings  Do not wear jewelry, lotions, powders or perfumes, deodorant             Men may shave face and neck.   Do not bring valuables to the hospital. [place].  Contacts, dentures or bridgework may not be worn into surgery.  Leave suitcase in the car. After surgery it may be brought to your room.     Patients discharged the day of surgery will not be allowed to drive home. IF YOU ARE HAVING SURGERY AND GOING HOME THE SAME DAY, YOU MUST HAVE AN ADULT TO DRIVE YOU HOME AND BE WITH YOU FOR 24 HOURS. YOU MAY GO HOME BY TAXI OR UBER OR ORTHERWISE, BUT AN ADULT MUST ACCOMPANY YOU HOME AND STAY WITH YOU FOR 24 HOURS.  Name and phone number of your driver:  Special Instructions: N/A              Please read over the following fact sheets you were given: _____________________________________________________________________          [facility] - Preparing for Surgery Before surgery, you can play an important  role.  Because skin is not sterile, your skin needs to be as free of germs as possible.  You can reduce the number of germs on your skin by washing with CHG (chlorahexidine gluconate) soap before surgery.  CHG is an antiseptic cleaner which kills germs and bonds with the skin to continue killing germs even after washing. Please DO NOT use if you have an allergy to CHG or antibacterial soaps.  If your skin becomes reddened/irritated stop using the CHG and inform your nurse when you arrive at Short Stay. Do not shave (including legs and underarms) for at least 48 hours prior to the first CHG shower.  You may shave your face/neck. Please follow these instructions carefully:  1.  Shower with CHG Soap the night before surgery and the  morning of Surgery.  2.  If you choose to wash your hair, wash your hair first as usual with your  normal  shampoo.  3.  After you shampoo, rinse your hair and body thoroughly to remove the  shampoo.                           4.  Use CHG as you would any other liquid soap.  You can apply chg directly  to the skin and wash  Gently with a scrungie or clean washcloth.  5.  Apply the CHG Soap to your body ONLY FROM THE NECK DOWN.   Do not use on face/ open                           Wound or open sores. Avoid contact with eyes, ears mouth and genitals (private parts).                       Wash face,  Genitals (private parts) with your normal soap.             6.  Wash thoroughly, paying special attention to the area where your surgery  will be performed.  7.  Thoroughly rinse your body with warm water from the neck down.  8.  DO NOT shower/wash with your normal soap after using and rinsing off  the CHG Soap.                9.  Pat yourself dry with a clean towel.            10.  Wear clean pajamas.            11.  Place clean sheets on your bed the night of your first shower and do not  sleep with pets. Day of Surgery : Do not apply any lotions/deodorants  the morning of surgery.  Please wear clean clothes to the hospital/surgery center.  FAILURE TO FOLLOW THESE INSTRUCTIONS MAY RESULT IN THE CANCELLATION OF YOUR SURGERY PATIENT SIGNATURE_________________________________  NURSE SIGNATURE__________________________________  ________________________________________________________________________

## 2020-05-18 NOTE — Progress Notes (Signed)
COVID Vaccine Completed: YES Date COVID Vaccine completed: 02/24/20 [place] COVID vaccine manufacturer: Pfizer     PCP - Dr. Pricilla Holm Cardiologist - Dr. Fransico Him. LOV: 05/11/20  Chest x-ray -  EKG - 05/29/19 Stress Test -  ECHO - 08/19/19 Cardiac Cath -  Pacemaker/ICD device last checked:  Sleep Study - Yes CPAP - No  Fasting Blood Sugar - 110's Checks Blood Sugar ___2__ times a day  Blood Thinner Instructions:Eliquis will be hold 2-3 days before surgery. Aspirin Instructions: Last Dose:  Anesthesia review: Hx: CKD(III),DIA,HTN,A flutter,OSA(No CPAP yet)  Patient denies shortness of breath, fever, cough and chest pain at PAT appointment   Patient verbalized understanding of instructions that were given to them at the PAT appointment. Patient was also instructed that they will need to review over the PAT instructions again at home before surgery.

## 2020-05-21 ENCOUNTER — Encounter (HOSPITAL_COMMUNITY)
Admission: RE | Admit: 2020-05-21 | Discharge: 2020-05-21 | Disposition: A | Discharge status: Home / Self Care | Payer: BC Managed Care – PPO | Source: Ambulatory Visit | Attending: Anesthesiology | Admitting: Anesthesiology

## 2020-05-21 HISTORY — DX: Cardiac arrhythmia, unspecified: I49.9

## 2020-05-23 ENCOUNTER — Encounter (HOSPITAL_COMMUNITY)
Admission: RE | Admit: 2020-05-23 | Discharge: 2020-05-23 | Disposition: A | Discharge status: Home / Self Care | Payer: BC Managed Care – PPO | Source: Ambulatory Visit | Attending: Urology | Admitting: Urology

## 2020-05-23 ENCOUNTER — Other Ambulatory Visit: Payer: Self-pay

## 2020-05-23 DIAGNOSIS — Z79899 Other long term (current) drug therapy: Secondary | ICD-10-CM | POA: Diagnosis not present

## 2020-05-23 DIAGNOSIS — Z7984 Long term (current) use of oral hypoglycemic drugs: Secondary | ICD-10-CM | POA: Insufficient documentation

## 2020-05-23 DIAGNOSIS — I480 Paroxysmal atrial fibrillation: Secondary | ICD-10-CM | POA: Diagnosis not present

## 2020-05-23 DIAGNOSIS — Z01812 Encounter for preprocedural laboratory examination: Secondary | ICD-10-CM | POA: Insufficient documentation

## 2020-05-23 DIAGNOSIS — N401 Enlarged prostate with lower urinary tract symptoms: Secondary | ICD-10-CM | POA: Diagnosis not present

## 2020-05-23 DIAGNOSIS — I5022 Chronic systolic (congestive) heart failure: Secondary | ICD-10-CM | POA: Diagnosis not present

## 2020-05-23 DIAGNOSIS — I13 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 1 through stage 4 chronic kidney disease, or unspecified chronic kidney disease: Secondary | ICD-10-CM | POA: Insufficient documentation

## 2020-05-23 DIAGNOSIS — E1122 Type 2 diabetes mellitus with diabetic chronic kidney disease: Secondary | ICD-10-CM | POA: Insufficient documentation

## 2020-05-23 DIAGNOSIS — N183 Chronic kidney disease, stage 3 unspecified: Secondary | ICD-10-CM | POA: Diagnosis not present

## 2020-05-23 DIAGNOSIS — Z7901 Long term (current) use of anticoagulants: Secondary | ICD-10-CM | POA: Insufficient documentation

## 2020-05-23 DIAGNOSIS — R338 Other retention of urine: Secondary | ICD-10-CM | POA: Insufficient documentation

## 2020-05-23 LAB — BASIC METABOLIC PANEL
Anion gap: 10 (ref 5–15)
BUN: 18 mg/dL (ref 8–23)
CO2: 28 mmol/L (ref 22–32)
Calcium: 9.3 mg/dL (ref 8.9–10.3)
Chloride: 104 mmol/L (ref 98–111)
Creatinine, Ser: 1.64 mg/dL — ABNORMAL HIGH (ref 0.61–1.24)
GFR, Estimated: 47 mL/min — ABNORMAL LOW (ref >60)
Glucose, Bld: 151 mg/dL — ABNORMAL HIGH (ref 70–99)
Potassium: 4.3 mmol/L (ref 3.5–5.1)
Sodium: 142 mmol/L (ref 135–145)

## 2020-05-23 LAB — CBC
HCT: 45.1 % (ref 39.0–52.0)
Hemoglobin: 14.3 g/dL (ref 13.0–17.0)
MCH: 28 pg (ref 26.0–34.0)
MCHC: 31.7 g/dL (ref 30.0–36.0)
MCV: 88.4 fL (ref 80.0–100.0)
Platelets: 243 10*3/uL (ref 150–400)
RBC: 5.1 MIL/uL (ref 4.22–5.81)
RDW: 16.5 % — ABNORMAL HIGH (ref 11.5–15.5)
WBC: 4.8 10*3/uL (ref 4.0–10.5)
nRBC: 0 % (ref 0.0–0.2)

## 2020-05-23 LAB — HEMOGLOBIN A1C
Hgb A1c MFr Bld: 8.2 % — ABNORMAL HIGH (ref 4.8–5.6)
Mean Plasma Glucose: 188.64 mg/dL

## 2020-05-23 LAB — GLUCOSE, CAPILLARY: Glucose-Capillary: 142 mg/dL — ABNORMAL HIGH (ref 70–99)

## 2020-05-24 NOTE — Telephone Encounter (Signed)
Yes, he is being set up.

## 2020-05-25 NOTE — Progress Notes (Signed)
Anesthesia Chart Review   Case: [id number] Date/Time: 05/31/20 1015   Procedure: XI ROBOTIC ASSISTED SIMPLE PROSTATECTOMY-SUPRAPUBIC (N/A )   Anesthesia type: General   Pre-op diagnosis: BPH with urinary retention   Location: [facility] ROOM 03 / [facility]   Surgeons: Cleon Gustin, MD      DISCUSSION:65 y.o. never smoker with h/o HTN, CKD Stage III, PAF (on Eliquis), sleep apnea, DM II, chronic systolic CHF, BPH with urinary retention scheduled for above procedure 05/31/2020 with Dr. Nicolette Bang.   Pt last seen by cardiology 05/11/2020. 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  -his functional capacity is 7.7 mets and therefore no further ischemic workup indicated -ok to hold Eliquis prior to TURP and restart when ok with Urology"  Anticipate pt can proceed with planned procedure barring acute status change.   VS: BP (!) 189/115 Comment: Dahlton Hinde (PA) is talking with patient.   Pulse 77    Temp 36.7 C (Oral)    Resp 18    Ht 6\' 5"  (1.956 m)    Wt (!) 138.3 kg    SpO2 97%    BMI 36.17 kg/m   PROVIDERS: [name], MD is PCP   Fransico Him, MD is Cardiologist  LABS: Labs reviewed: Acceptable for surgery. (all labs ordered are listed, but only abnormal results are displayed)  Labs Reviewed  GLUCOSE, CAPILLARY - Abnormal; Notable for the following components:      Result Value   Glucose-Capillary 142 (*)    All other components within normal limits     IMAGES:   EKG:   CV: Echo 08/19/2019 IMPRESSIONS    1. Left ventricular ejection fraction, by estimation, is 60 to 65%. The  left ventricle has normal function. The left ventricle has no regional  wall motion abnormalities. There is mild concentric left ventricular  hypertrophy. Left ventricular  diastolic  parameters are indeterminate.  2. Right ventricular systolic function is normal. The right ventricular  size is mildly enlarged. There is normal pulmonary artery systolic  pressure.  3. Left atrial size was moderately dilated.  4. Right atrial size was moderately dilated.  5. The mitral valve is normal in structure. No evidence of mitral valve  regurgitation. No evidence of mitral stenosis.  6. The aortic valve is normal in structure. Aortic valve regurgitation is  not visualized. No aortic stenosis is present.  7. Aortic dilatation noted. There is mild dilatation at the level of the  sinuses of Valsalva measuring 40 mm.  8. The inferior vena cava is normal in size with greater than 50%  respiratory variability, suggesting right atrial pressure of 3 mmHg. 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  Qualifier: Diagnosis of  By: Linda Hedges MD, Heinz Knuckles    Sleep apnea    No CPAP   Type II diabetes mellitus with renal manifestations, uncontrolled ([place]) 08/03/2007   Qualifier: Diagnosis of  By: Linda Hedges MD, Heinz Knuckles  Med metformin     Past Surgical History:  Procedure Laterality Date   CARDIOVERSION N/A 07/18/2019   Procedure: CARDIOVERSION;  Surgeon: Dorothy Spark, MD;  [facility] END[facility]COPY;  Service: Cardiovascular;  Laterality: N/A;   TEE WITHOUT CARDIOVERSION N/A 07/18/2019   Procedure: TRANSESOPHAGEAL ECHOCARDIOGRAM (TEE);  Surgeon: Dorothy Spark,  MD;  Location: [facility] ENDOSCOPY;  Service: Cardiovascular;  Laterality: N/A;   TOOTH EXTRACTION      MEDICATIONS:  alfuzosin (UROXATRAL) 10 MG 24 hr tablet   ammonium lactate (AMLACTIN) 12 % lotion   apixaban (ELIQUIS) 5 MG TABS tablet   atorvastatin (LIPITOR) 80 MG tablet   carvedilol (COREG) 25 MG tablet   finasteride (PROSCAR) 5 MG tablet   glipiZIDE (GLUCOTROL) 5 MG tablet   isosorbide-hydrALAZINE (BIDIL) 20-37.5 MG tablet   metFORMIN (GLUCOPHAGE-XR) 500 MG 24 hr tablet   olmesartan-hydrochlorothiazide (BENICAR HCT) 07-62.2 MG tablet   TRULICITY 1.5 QJ/3.3LK SOPN   No current facility-administered medications for this encounter.    Konrad Felix, PA-C [facility] [phone]

## 2020-05-25 NOTE — Anesthesia Preprocedure Evaluation (Addendum)
Anesthesia Evaluation  Patient identified by MRN, date of birth, ID band Patient awake    Reviewed: Allergy & Precautions, NPO status , Patient's Chart, lab work & pertinent test results  Airway Mallampati: I  TM Distance: >3 FB Neck ROM: Full    Dental no notable dental hx. (+) Chipped, Dental Advisory Given, Teeth Intact   Pulmonary sleep apnea and Continuous Positive Airway Pressure Ventilation ,    Pulmonary exam normal breath sounds clear to auscultation       Cardiovascular Exercise Tolerance: Good hypertension, Pt. on home beta blockers and Pt. on medications +CHF  Normal cardiovascular exam+ dysrhythmias Atrial Fibrillation  Rhythm:Regular Rate:Normal  08/19/19 Echo  Left Ventricle: Left ventricular ejection fraction, by estimation, is 60  to 65%. The left ventricle has normal function. The left ventricle has no  regional wall motion abnormalities. The left ventricular internal cavity  size was normal in size. There is  mild concentric left ventricular hypertrophy. Left ventricular diastolic  parameters are indeterminate. Normal left ventricular filling pressure.    Neuro/Psych  Neuromuscular disease negative psych ROS   GI/Hepatic negative GI ROS, Neg liver ROS,   Endo/Other  diabetes, Type 2, Oral Hypoglycemic Agents  Renal/GU Renal InsufficiencyRenal diseaseStage 3 K+ 4.3 Cr 1.64      Musculoskeletal negative musculoskeletal ROS (+)   Abdominal (+) + obese,   Peds  Hematology negative hematology ROS (+) Hgb 14.3   Anesthesia Other Findings   Reproductive/Obstetrics                           Anesthesia Physical Anesthesia Plan  ASA: III  Anesthesia Plan: General   Post-op Pain Management:    Induction: Intravenous  PONV Risk Score and Plan: 3 and Treatment may vary due to age or medical condition, Midazolam and Ondansetron  Airway Management Planned: Oral  ETT  Additional Equipment: None  Intra-op Plan:   Post-operative Plan: Extubation in OR  Informed Consent: I have reviewed the patients History and Physical, chart, labs and discussed the procedure including the risks, benefits and alternatives for the proposed anesthesia with the patient or authorized representative who has indicated his/her understanding and acceptance.     Dental advisory given  Plan Discussed with: CRNA  Anesthesia Plan Comments: (See PAT note 05/23/2020, Konrad Felix, PA-C  GA w lidocaine infusion +/- Ketamine)      Anesthesia Quick Evaluation

## 2020-05-25 NOTE — Progress Notes (Signed)
Lab. Results: A1-C: 8.2. Creatinine: 1.64

## 2020-05-28 ENCOUNTER — Other Ambulatory Visit (HOSPITAL_COMMUNITY)
Admission: RE | Admit: 2020-05-28 | Discharge: 2020-05-28 | Disposition: A | Discharge status: Home / Self Care | Payer: BC Managed Care – PPO | Source: Ambulatory Visit | Attending: Urology | Admitting: Urology

## 2020-05-28 DIAGNOSIS — Z20822 Contact with and (suspected) exposure to covid-19: Secondary | ICD-10-CM | POA: Insufficient documentation

## 2020-05-28 DIAGNOSIS — Z01812 Encounter for preprocedural laboratory examination: Secondary | ICD-10-CM | POA: Insufficient documentation

## 2020-05-28 LAB — SARS CORONAVIRUS 2 (TAT 6-24 HRS): SARS Coronavirus 2: NEGATIVE

## 2020-05-30 ENCOUNTER — Telehealth: Payer: Self-pay

## 2020-05-30 ENCOUNTER — Encounter (HOSPITAL_COMMUNITY): Payer: Self-pay | Admitting: Urology

## 2020-05-30 MED ORDER — DEXTROSE 5 % IV SOLN
3.0000 g | INTRAVENOUS | Status: AC
  Administered 2020-05-31: 3 g via INTRAVENOUS
  Filled 2020-05-30: qty 3

## 2020-05-30 NOTE — Telephone Encounter (Signed)
Pt surgery appt has been moved up to 7:30 start time. Pt notified to arrive as Johnny [name] at 5:30 am. Pt voiced understanding.

## 2020-05-31 ENCOUNTER — Inpatient Hospital Stay (HOSPITAL_COMMUNITY): Payer: BC Managed Care – PPO | Admitting: Anesthesiology

## 2020-05-31 ENCOUNTER — Inpatient Hospital Stay (HOSPITAL_COMMUNITY): Payer: BC Managed Care – PPO | Admitting: Physician Assistant

## 2020-05-31 ENCOUNTER — Other Ambulatory Visit: Payer: Self-pay

## 2020-05-31 ENCOUNTER — Inpatient Hospital Stay (HOSPITAL_COMMUNITY)
Admission: RE | Admit: 2020-05-31 | Discharge: 2020-06-03 | DRG: 707 | Disposition: A | Discharge status: Home / Self Care | Payer: BC Managed Care – PPO | Attending: Urology | Admitting: Urology

## 2020-05-31 ENCOUNTER — Encounter (HOSPITAL_COMMUNITY)
Admission: RE | Disposition: A | Discharge status: Home / Self Care | Payer: Self-pay | Source: Home / Self Care | Attending: Urology

## 2020-05-31 ENCOUNTER — Encounter (HOSPITAL_COMMUNITY): Payer: Self-pay | Admitting: Urology

## 2020-05-31 DIAGNOSIS — I129 Hypertensive chronic kidney disease with stage 1 through stage 4 chronic kidney disease, or unspecified chronic kidney disease: Secondary | ICD-10-CM | POA: Diagnosis present

## 2020-05-31 DIAGNOSIS — N401 Enlarged prostate with lower urinary tract symptoms: Secondary | ICD-10-CM | POA: Diagnosis present

## 2020-05-31 DIAGNOSIS — E1169 Type 2 diabetes mellitus with other specified complication: Secondary | ICD-10-CM | POA: Diagnosis present

## 2020-05-31 DIAGNOSIS — E669 Obesity, unspecified: Secondary | ICD-10-CM | POA: Diagnosis present

## 2020-05-31 DIAGNOSIS — N138 Other obstructive and reflux uropathy: Secondary | ICD-10-CM | POA: Diagnosis present

## 2020-05-31 DIAGNOSIS — Z20822 Contact with and (suspected) exposure to covid-19: Secondary | ICD-10-CM | POA: Diagnosis present

## 2020-05-31 DIAGNOSIS — N183 Chronic kidney disease, stage 3 unspecified: Secondary | ICD-10-CM | POA: Diagnosis present

## 2020-05-31 DIAGNOSIS — E785 Hyperlipidemia, unspecified: Secondary | ICD-10-CM | POA: Diagnosis present

## 2020-05-31 DIAGNOSIS — Z6837 Body mass index (BMI) 37.0-37.9, adult: Secondary | ICD-10-CM

## 2020-05-31 DIAGNOSIS — Z7984 Long term (current) use of oral hypoglycemic drugs: Secondary | ICD-10-CM

## 2020-05-31 DIAGNOSIS — R338 Other retention of urine: Secondary | ICD-10-CM | POA: Diagnosis present

## 2020-05-31 DIAGNOSIS — E1122 Type 2 diabetes mellitus with diabetic chronic kidney disease: Secondary | ICD-10-CM | POA: Diagnosis present

## 2020-05-31 DIAGNOSIS — Z79899 Other long term (current) drug therapy: Secondary | ICD-10-CM

## 2020-05-31 HISTORY — PX: XI ROBOTIC ASSISTED SIMPLE PROSTATECTOMY: SHX6713

## 2020-05-31 LAB — TYPE AND SCREEN
ABO/RH(D): O POS
Antibody Screen: NEGATIVE

## 2020-05-31 LAB — HEMOGLOBIN AND HEMATOCRIT, BLOOD
HCT: 43.7 % (ref 39.0–52.0)
Hemoglobin: 13.6 g/dL (ref 13.0–17.0)

## 2020-05-31 LAB — GLUCOSE, CAPILLARY
Glucose-Capillary: 94 mg/dL (ref 70–99)
Glucose-Capillary: 122 mg/dL — ABNORMAL HIGH (ref 70–99)
Glucose-Capillary: 128 mg/dL — ABNORMAL HIGH (ref 70–99)
Glucose-Capillary: 133 mg/dL — ABNORMAL HIGH (ref 70–99)

## 2020-05-31 LAB — ABO/RH: ABO/RH(D): O POS

## 2020-05-31 SURGERY — PROSTATECTOMY, SIMPLE, ROBOT-ASSISTED
Anesthesia: General

## 2020-05-31 MED ORDER — OXYCODONE HCL 5 MG PO TABS: 5.0000 mg | ORAL_TABLET | Freq: Once | ORAL | Status: DC | PRN

## 2020-05-31 MED ORDER — MIDAZOLAM HCL 5 MG/5ML IJ SOLN
INTRAMUSCULAR | Status: DC | PRN
  Administered 2020-05-31: 2 mg via INTRAVENOUS

## 2020-05-31 MED ORDER — INSULIN ASPART 100 UNIT/ML ~~LOC~~ SOLN
0.0000 [IU] | Freq: Three times a day (TID) | SUBCUTANEOUS | Status: DC
  Administered 2020-06-01: 2 [IU] via SUBCUTANEOUS
  Administered 2020-06-02: 3 [IU] via SUBCUTANEOUS
  Administered 2020-06-02 – 2020-06-03 (×2): 2 [IU] via SUBCUTANEOUS

## 2020-05-31 MED ORDER — CHLORHEXIDINE GLUCONATE 0.12 % MT SOLN
15.0000 mL | Freq: Once | OROMUCOSAL | Status: AC
  Administered 2020-05-31: 15 mL via OROMUCOSAL

## 2020-05-31 MED ORDER — SODIUM CHLORIDE 0.9 % IV BOLUS
1000.0000 mL | Freq: Once | INTRAVENOUS | Status: AC
  Administered 2020-05-31: 1000 mL via INTRAVENOUS

## 2020-05-31 MED ORDER — FENTANYL CITRATE (PF) 100 MCG/2ML IJ SOLN
INTRAMUSCULAR | Status: DC | PRN
  Administered 2020-05-31: 50 ug via INTRAVENOUS
  Administered 2020-05-31: 150 ug via INTRAVENOUS
  Administered 2020-05-31: 50 ug via INTRAVENOUS

## 2020-05-31 MED ORDER — DEXAMETHASONE SODIUM PHOSPHATE 10 MG/ML IJ SOLN
INTRAMUSCULAR | Status: DC | PRN
  Administered 2020-05-31: 6 mg via INTRAVENOUS

## 2020-05-31 MED ORDER — ACETAMINOPHEN 325 MG PO TABS: 650.0000 mg | ORAL_TABLET | ORAL | Status: DC | PRN

## 2020-05-31 MED ORDER — LIDOCAINE HCL 2 % IJ SOLN
INTRAMUSCULAR | Status: AC
  Filled 2020-05-31: qty 20

## 2020-05-31 MED ORDER — BUPIVACAINE LIPOSOME 1.3 % IJ SUSP
20.0000 mL | Freq: Once | INTRAMUSCULAR | Status: AC
  Administered 2020-05-31: 20 mL
  Filled 2020-05-31: qty 20

## 2020-05-31 MED ORDER — HYDROMORPHONE HCL 1 MG/ML IJ SOLN
0.2500 mg | INTRAMUSCULAR | Status: DC | PRN
Start: 2020-05-31 — End: 2020-05-31
  Administered 2020-05-31 (×2): 0.5 mg via INTRAVENOUS

## 2020-05-31 MED ORDER — ONDANSETRON HCL 4 MG/2ML IJ SOLN
INTRAMUSCULAR | Status: AC
  Filled 2020-05-31: qty 4

## 2020-05-31 MED ORDER — SULFAMETHOXAZOLE-TRIMETHOPRIM 800-160 MG PO TABS: 1.0000 | ORAL_TABLET | Freq: Two times a day (BID) | ORAL | 0 refills | Status: DC

## 2020-05-31 MED ORDER — SUCCINYLCHOLINE CHLORIDE 200 MG/10ML IV SOSY
PREFILLED_SYRINGE | INTRAVENOUS | Status: DC | PRN
  Administered 2020-05-31: 140 mg via INTRAVENOUS

## 2020-05-31 MED ORDER — IRBESARTAN 300 MG PO TABS
300.0000 mg | ORAL_TABLET | Freq: Every day | ORAL | Status: DC
  Administered 2020-06-01 – 2020-06-03 (×3): 300 mg via ORAL
  Filled 2020-05-31 (×3): qty 1

## 2020-05-31 MED ORDER — LACTATED RINGERS IV SOLN
INTRAVENOUS | Status: DC
  Administered 2020-05-31: 1000 mL via INTRAVENOUS

## 2020-05-31 MED ORDER — LIDOCAINE HCL (PF) 2 % IJ SOLN
INTRAMUSCULAR | Status: AC
  Filled 2020-05-31: qty 10

## 2020-05-31 MED ORDER — LIDOCAINE 2% (20 MG/ML) 5 ML SYRINGE
INTRAMUSCULAR | Status: DC | PRN
  Administered 2020-05-31: 100 mg via INTRAVENOUS
  Administered 2020-05-31: 1 mg/kg/h via INTRAVENOUS

## 2020-05-31 MED ORDER — MIDAZOLAM HCL 2 MG/2ML IJ SOLN
INTRAMUSCULAR | Status: AC
  Filled 2020-05-31: qty 2

## 2020-05-31 MED ORDER — PROPOFOL 10 MG/ML IV BOLUS
INTRAVENOUS | Status: AC
  Filled 2020-05-31: qty 20

## 2020-05-31 MED ORDER — PROPOFOL 10 MG/ML IV BOLUS
INTRAVENOUS | Status: DC | PRN
  Administered 2020-05-31: 170 mg via INTRAVENOUS

## 2020-05-31 MED ORDER — BELLADONNA ALKALOIDS-OPIUM 16.2-60 MG RE SUPP
1.0000 | Freq: Four times a day (QID) | RECTAL | Status: DC | PRN
  Administered 2020-05-31: 1 via RECTAL
  Filled 2020-05-31: qty 1

## 2020-05-31 MED ORDER — HYDROCHLOROTHIAZIDE 12.5 MG PO CAPS
12.5000 mg | ORAL_CAPSULE | Freq: Every day | ORAL | Status: DC
  Administered 2020-06-01 – 2020-06-03 (×3): 12.5 mg via ORAL
  Filled 2020-05-31 (×3): qty 1

## 2020-05-31 MED ORDER — EPHEDRINE SULFATE-NACL 50-0.9 MG/10ML-% IV SOSY
PREFILLED_SYRINGE | INTRAVENOUS | Status: DC | PRN
  Administered 2020-05-31 (×2): 5 mg via INTRAVENOUS

## 2020-05-31 MED ORDER — ORAL CARE MOUTH RINSE: 15.0000 mL | Freq: Once | OROMUCOSAL | Status: AC

## 2020-05-31 MED ORDER — OXYCODONE HCL 5 MG/5ML PO SOLN
5.0000 mg | Freq: Once | ORAL | Status: DC | PRN
Start: 2020-05-31 — End: 2020-05-31

## 2020-05-31 MED ORDER — STERILE WATER FOR IRRIGATION IR SOLN
Status: DC | PRN
  Administered 2020-05-31: 1000 mL

## 2020-05-31 MED ORDER — FINASTERIDE 5 MG PO TABS
5.0000 mg | ORAL_TABLET | Freq: Every day | ORAL | Status: DC
  Administered 2020-05-31 – 2020-06-03 (×4): 5 mg via ORAL
  Filled 2020-05-31 (×4): qty 1

## 2020-05-31 MED ORDER — CHLORHEXIDINE GLUCONATE CLOTH 2 % EX PADS: 6.0000 | MEDICATED_PAD | Freq: Once | CUTANEOUS | Status: DC

## 2020-05-31 MED ORDER — ROCURONIUM BROMIDE 10 MG/ML (PF) SYRINGE
PREFILLED_SYRINGE | INTRAVENOUS | Status: DC | PRN
  Administered 2020-05-31: 80 mg via INTRAVENOUS
  Administered 2020-05-31 (×2): 20 mg via INTRAVENOUS
  Administered 2020-05-31: 10 mg via INTRAVENOUS

## 2020-05-31 MED ORDER — HYDROMORPHONE HCL 2 MG/ML IJ SOLN
INTRAMUSCULAR | Status: AC
  Filled 2020-05-31: qty 1

## 2020-05-31 MED ORDER — SODIUM CHLORIDE 0.45 % IV SOLN: INTRAVENOUS | Status: DC

## 2020-05-31 MED ORDER — SUGAMMADEX SODIUM 500 MG/5ML IV SOLN
INTRAVENOUS | Status: DC | PRN
  Administered 2020-05-31: 300 mg via INTRAVENOUS

## 2020-05-31 MED ORDER — ONDANSETRON HCL 4 MG/2ML IJ SOLN: 4.0000 mg | INTRAMUSCULAR | Status: DC | PRN

## 2020-05-31 MED ORDER — LACTATED RINGERS IR SOLN
Status: DC | PRN
  Administered 2020-05-31: 1000 mL

## 2020-05-31 MED ORDER — HYDROMORPHONE HCL 1 MG/ML IJ SOLN
INTRAMUSCULAR | Status: DC | PRN
  Administered 2020-05-31 (×2): .5 mg via INTRAVENOUS

## 2020-05-31 MED ORDER — DIPHENHYDRAMINE HCL 12.5 MG/5ML PO ELIX: 12.5000 mg | ORAL_SOLUTION | Freq: Four times a day (QID) | ORAL | Status: DC | PRN

## 2020-05-31 MED ORDER — ISOSORB DINITRATE-HYDRALAZINE 20-37.5 MG PO TABS
1.0000 | ORAL_TABLET | Freq: Three times a day (TID) | ORAL | Status: DC
  Administered 2020-05-31 – 2020-06-03 (×8): 1 via ORAL
  Filled 2020-05-31 (×12): qty 1

## 2020-05-31 MED ORDER — ONDANSETRON HCL 4 MG/2ML IJ SOLN
INTRAMUSCULAR | Status: DC | PRN
  Administered 2020-05-31: 4 mg via INTRAVENOUS

## 2020-05-31 MED ORDER — SODIUM CHLORIDE 0.9 % IR SOLN
3000.0000 mL | Status: DC
  Administered 2020-05-31 – 2020-06-02 (×17): 3000 mL

## 2020-05-31 MED ORDER — LIDOCAINE HCL (PF) 2 % IJ SOLN
INTRAMUSCULAR | Status: AC
  Filled 2020-05-31: qty 5

## 2020-05-31 MED ORDER — SUGAMMADEX SODIUM 500 MG/5ML IV SOLN
INTRAVENOUS | Status: AC
  Filled 2020-05-31: qty 5

## 2020-05-31 MED ORDER — DEXAMETHASONE SODIUM PHOSPHATE 10 MG/ML IJ SOLN
INTRAMUSCULAR | Status: AC
  Filled 2020-05-31: qty 2

## 2020-05-31 MED ORDER — OLMESARTAN MEDOXOMIL-HCTZ 40-12.5 MG PO TABS: 1.0000 | ORAL_TABLET | Freq: Every day | ORAL | Status: DC

## 2020-05-31 MED ORDER — ATORVASTATIN CALCIUM 40 MG PO TABS
80.0000 mg | ORAL_TABLET | Freq: Every day | ORAL | Status: DC
  Administered 2020-05-31 – 2020-06-03 (×4): 80 mg via ORAL
  Filled 2020-05-31 (×4): qty 2

## 2020-05-31 MED ORDER — CARVEDILOL 25 MG PO TABS
25.0000 mg | ORAL_TABLET | Freq: Two times a day (BID) | ORAL | Status: DC
  Administered 2020-05-31 – 2020-06-03 (×5): 25 mg via ORAL
  Filled 2020-05-31 (×6): qty 1

## 2020-05-31 MED ORDER — BACITRACIN-NEOMYCIN-POLYMYXIN 400-5-5000 EX OINT: 1.0000 "application " | TOPICAL_OINTMENT | Freq: Three times a day (TID) | CUTANEOUS | Status: DC | PRN

## 2020-05-31 MED ORDER — HYDROMORPHONE HCL 1 MG/ML IJ SOLN
INTRAMUSCULAR | Status: AC
  Administered 2020-05-31: 0.5 mg via INTRAVENOUS
  Filled 2020-05-31: qty 2

## 2020-05-31 MED ORDER — SODIUM CHLORIDE (PF) 0.9 % IJ SOLN
INTRAMUSCULAR | Status: AC
  Filled 2020-05-31: qty 20

## 2020-05-31 MED ORDER — AMISULPRIDE (ANTIEMETIC) 5 MG/2ML IV SOLN: 10.0000 mg | Freq: Once | INTRAVENOUS | Status: DC | PRN

## 2020-05-31 MED ORDER — HYDROCODONE-ACETAMINOPHEN 5-325 MG PO TABS: 1.0000 | ORAL_TABLET | Freq: Four times a day (QID) | ORAL | 0 refills | Status: DC | PRN

## 2020-05-31 MED ORDER — HYDROMORPHONE HCL 1 MG/ML IJ SOLN
0.5000 mg | INTRAMUSCULAR | Status: DC | PRN
  Administered 2020-05-31 – 2020-06-01 (×2): 1 mg via INTRAVENOUS
  Filled 2020-05-31 (×2): qty 1

## 2020-05-31 MED ORDER — FENTANYL CITRATE (PF) 250 MCG/5ML IJ SOLN
INTRAMUSCULAR | Status: AC
  Filled 2020-05-31: qty 5

## 2020-05-31 MED ORDER — ONDANSETRON HCL 4 MG/2ML IJ SOLN: 4.0000 mg | Freq: Once | INTRAMUSCULAR | Status: DC | PRN

## 2020-05-31 MED ORDER — DOCUSATE SODIUM 100 MG PO CAPS
100.0000 mg | ORAL_CAPSULE | Freq: Two times a day (BID) | ORAL | Status: DC
  Administered 2020-05-31 – 2020-06-03 (×6): 100 mg via ORAL
  Filled 2020-05-31 (×6): qty 1

## 2020-05-31 MED ORDER — KETOROLAC TROMETHAMINE 30 MG/ML IJ SOLN: 30.0000 mg | Freq: Once | INTRAMUSCULAR | Status: DC | PRN

## 2020-05-31 MED ORDER — ROCURONIUM BROMIDE 10 MG/ML (PF) SYRINGE
PREFILLED_SYRINGE | INTRAVENOUS | Status: AC
  Filled 2020-05-31: qty 20

## 2020-05-31 MED ORDER — OXYCODONE HCL 5 MG PO TABS
5.0000 mg | ORAL_TABLET | ORAL | Status: DC | PRN
  Administered 2020-05-31: 5 mg via ORAL
  Filled 2020-05-31: qty 1

## 2020-05-31 MED ORDER — DIPHENHYDRAMINE HCL 50 MG/ML IJ SOLN: 12.5000 mg | Freq: Four times a day (QID) | INTRAMUSCULAR | Status: DC | PRN

## 2020-05-31 MED ORDER — SODIUM CHLORIDE (PF) 0.9 % IJ SOLN
INTRAMUSCULAR | Status: DC | PRN
  Administered 2020-05-31: 10 mL

## 2020-05-31 SURGICAL SUPPLY — 62 items
ADH SKN CLS APL DERMABOND .7 (GAUZE/BANDAGES/DRESSINGS) ×1
APL PRP STRL LF DISP 70% ISPRP (MISCELLANEOUS) ×1
APL SWBSTK 6 STRL LF DISP (MISCELLANEOUS) ×1
APPLICATOR COTTON TIP 6 STRL (MISCELLANEOUS) ×1 IMPLANT
APPLICATOR COTTON TIP 6IN STRL (MISCELLANEOUS) ×2
CATH FOLEY 2WAY SLVR  5CC 18FR (CATHETERS) ×2
CATH FOLEY 2WAY SLVR 5CC 18FR (CATHETERS) ×1 IMPLANT
CATH FOLEY 3WAY 30CC 22FR (CATHETERS) ×2 IMPLANT
CHLORAPREP W/TINT 26 (MISCELLANEOUS) ×2 IMPLANT
CLOTH BEACON ORANGE TIMEOUT ST (SAFETY) ×2 IMPLANT
COVER SURGICAL LIGHT HANDLE (MISCELLANEOUS) ×2 IMPLANT
COVER TIP SHEARS 8 DVNC (MISCELLANEOUS) ×1 IMPLANT
COVER TIP SHEARS 8MM DA VINCI (MISCELLANEOUS) ×2
DECANTER SPIKE VIAL GLASS SM (MISCELLANEOUS) ×2 IMPLANT
DERMABOND ADVANCED (GAUZE/BANDAGES/DRESSINGS) ×1
DERMABOND ADVANCED .7 DNX12 (GAUZE/BANDAGES/DRESSINGS) ×1 IMPLANT
DRAPE ARM DVNC X/XI (DISPOSABLE) ×4 IMPLANT
DRAPE COLUMN DVNC XI (DISPOSABLE) ×1 IMPLANT
DRAPE DA VINCI XI ARM (DISPOSABLE) ×8
DRAPE DA VINCI XI COLUMN (DISPOSABLE) ×2
DRAPE SURG IRRIG POUCH 19X23 (DRAPES) ×2 IMPLANT
DRSG TEGADERM 4X4.75 (GAUZE/BANDAGES/DRESSINGS) ×2 IMPLANT
ELECT PENCIL ROCKER SW 15FT (MISCELLANEOUS) ×2 IMPLANT
ELECT REM PT RETURN 15FT ADLT (MISCELLANEOUS) ×2 IMPLANT
GAUZE SPONGE 2X2 8PLY STRL LF (GAUZE/BANDAGES/DRESSINGS) ×1 IMPLANT
GLOVE BIO SURGEON STRL SZ8 (GLOVE) ×4 IMPLANT
GLOVE SRG 8 PF TXTR STRL LF DI (GLOVE) ×2 IMPLANT
GLOVE SURG ENC MOIS LTX SZ6.5 (GLOVE) ×2 IMPLANT
GLOVE SURG UNDER POLY LF SZ8 (GLOVE) ×4
GOWN STRL REUS W/TWL LRG LVL3 (GOWN DISPOSABLE) ×6 IMPLANT
HOLDER FOLEY CATH W/STRAP (MISCELLANEOUS) ×2 IMPLANT
IRRIG SUCT STRYKERFLOW 2 WTIP (MISCELLANEOUS) ×2
IRRIGATION SUCT STRKRFLW 2 WTP (MISCELLANEOUS) ×1 IMPLANT
IV LACTATED RINGERS 1000ML (IV SOLUTION) ×2 IMPLANT
KIT TURNOVER KIT A (KITS) IMPLANT
NDL INSUFFLATION 14GA 120MM (NEEDLE) ×1 IMPLANT
NEEDLE INSUFFLATION 14GA 120MM (NEEDLE) ×2 IMPLANT
PACK ROBOT UROLOGY CUSTOM (CUSTOM PROCEDURE TRAY) ×2 IMPLANT
PAD POSITIONING PINK XL (MISCELLANEOUS) ×2 IMPLANT
SEAL CANN UNIV 5-8 DVNC XI (MISCELLANEOUS) ×4 IMPLANT
SEAL XI 5MM-8MM UNIVERSAL (MISCELLANEOUS) ×8
SET IRRIG Y TYPE TUR BLADDER L (SET/KITS/TRAYS/PACK) IMPLANT
SET TUBE SMOKE EVAC HIGH FLOW (TUBING) ×2 IMPLANT
SOLUTION ELECTROLUBE (MISCELLANEOUS) ×2 IMPLANT
SPONGE GAUZE 2X2 STER 10/PKG (GAUZE/BANDAGES/DRESSINGS)
SPONGE LAP 4X18 RFD (DISPOSABLE) ×1 IMPLANT
SUT ETHILON 3 0 PS 1 (SUTURE) ×2 IMPLANT
SUT MNCRL AB 4-0 PS2 18 (SUTURE) ×4 IMPLANT
SUT PDS AB 0 CT1 36 (SUTURE) ×2 IMPLANT
SUT V-LOC BARB 180 2/0GR6 GS22 (SUTURE) ×4
SUT VIC AB 0 CT1 27 (SUTURE) ×10
SUT VIC AB 0 CT1 27XBRD ANTBC (SUTURE) ×4 IMPLANT
SUT VIC AB 2-0 CT1 27 (SUTURE) ×2
SUT VIC AB 2-0 CT1 27XBRD (SUTURE) IMPLANT
SUT VIC AB 2-0 SH 27 (SUTURE)
SUT VIC AB 2-0 SH 27X BRD (SUTURE) IMPLANT
SUT VICRYL 0 UR6 27IN ABS (SUTURE) ×8 IMPLANT
SUT VLOC BARB 180 ABS3/0GR12 (SUTURE)
SUTURE V-LC BRB 180 2/0GR6GS22 (SUTURE) ×2 IMPLANT
SUTURE VLOC BRB 180 ABS3/0GR12 (SUTURE) IMPLANT
TOWEL OR NON WOVEN STRL DISP B (DISPOSABLE) ×2 IMPLANT
WATER STERILE IRR 1000ML POUR (IV SOLUTION) ×2 IMPLANT

## 2020-05-31 NOTE — Progress Notes (Signed)
Pt with large clot on end of penis, against foley cath. Clot removed and gauze dressing applied with gentle pressure to support decreased bleeding.  [place] RN and Marcella Dubs

## 2020-05-31 NOTE — Anesthesia Procedure Notes (Signed)
Procedure Name: Intubation Date/Time: 05/31/2020 7:50 AM Performed by: Lavina Hamman, CRNA Pre-anesthesia Checklist: Patient identified, Emergency Drugs available, Suction available, Patient being monitored and Timeout performed Patient Re-evaluated:Patient Re-evaluated prior to induction Oxygen Delivery Method: Circle system utilized Preoxygenation: Pre-oxygenation with 100% oxygen Induction Type: IV induction Ventilation: Mask ventilation without difficulty Laryngoscope Size: Mac and 4 Grade View: Grade I Tube type: Oral Tube size: 8.0 mm Number of attempts: 1 Airway Equipment and Method: Stylet Placement Confirmation: ETT inserted through vocal cords under direct vision,  positive ETCO2,  CO2 detector and breath sounds checked- equal and bilateral Secured at: 24 cm Tube secured with: Tape Dental Injury: Teeth and Oropharynx as per pre-operative assessment  Comments: ATOI by Sena Hitch.

## 2020-05-31 NOTE — Progress Notes (Signed)
Patient's catheter not draining overnight, CBI stopped. Came to patient's room to assess, gently irrigated catheter until urine was flowing. Only return of small clots. Suspect some element of positioning of the catheter also playing a role. Bladder scan performed showing 51mL in bladder. Restarted CBI, urine clear light pink on rapid drip. Patient comfortable. Will keep on rapid drip overnight for urine to remain light pink.   Carmie Kanner, MD PGY-4 Urology

## 2020-05-31 NOTE — Discharge Instructions (Signed)
1. Activity:  You are encouraged to ambulate frequently (about every hour during waking hours) to help prevent blood clots from forming in your legs or lungs.  However, you should not engage in any heavy lifting (> 10-15 lbs), strenuous activity, or straining. 2. Diet: You should continue a clear liquid diet until passing gas from below.  Once this occurs, you may advance your diet to a soft diet that would be easy to digest (i.e soups, scrambled eggs, mashed potatoes, etc.) for 24 hours just as you would if getting over a bad stomach flu.  If tolerating this diet well for 24 hours, you may then begin eating regular food.  It will be normal to have some amount of bloating, nausea, and abdominal discomfort intermittently. 3. Prescriptions:  You will be provided a prescription for pain medication to take as needed.  If your pain is not severe enough to require the prescription pain medication, you may take Tylenol instead.  You should also take an over the counter stool softener (Colace 100 mg twice daily) to avoid straining with bowel movements as the pain medication may constipate you. Finally, you will also be provided a prescription for an antibiotic to begin the day prior to your return visit in the office for catheter removal. 4. Catheter care: You will be taught how to take care of the catheter by the nursing staff prior to discharge from the hospital.  You may use both a leg bag and the larger bedside bag but it is recommended to at least use the bigger bedside bag at nighttime as the leg bag is small and will fill up overnight and also does not drain as well when lying flat. You may periodically feel a strong urge to void with the catheter in place.  This is a bladder spasm and most often can occur when having a bowel movement or when you are moving around. It is typically self-limited and usually will stop after a few minutes.  You may use some Vaseline or Neosporin around the tip of the catheter to  reduce friction at the tip of the penis. 5. Incisions: You may remove your dressing bandages the 2nd day after surgery.  You most likely will have a few small staples in each of the incisions and once the bandages are removed, the incisions may stay open to air.  You may start showering (not soaking or bathing in water) 48 hours after surgery and the incisions simply need to be patted dry after the shower.  No additional care is needed. 6. What to call us about: You should call the office [phone]) if you develop fever > 101, persistent vomiting, or the catheter stops draining. Also, feel free to call with any other questions you may have and remember the handout that was provided to you as a reference preoperatively which answers many of the common questions that arise after surgery. 7. You may resume aspirin, advil, aleve, vitamins, and supplements 7 days after surgery.  You may resume Eliquis 5 days after surgery.

## 2020-05-31 NOTE — Transfer of Care (Signed)
Immediate Anesthesia Transfer of Care Note  Patient: Johnny [name]  Procedure(s) Performed: Procedure(s): XI ROBOTIC ASSISTED SIMPLE PROSTATECTOMY-SUPRAPUBIC (N/A)  Patient Location: PACU  Anesthesia Type:General  Level of Consciousness:  sedated, patient cooperative and responds to stimulation  Airway & Oxygen Therapy:Patient Spontanous Breathing and Patient connected to face mask oxgen  Post-op Assessment:  Report given to PACU RN and Post -op Vital signs reviewed and stable  Post vital signs:  Reviewed and stable  Last Vitals:  Vitals:   05/31/20 0550  BP: (!) 161/93  Pulse: 87  Resp: 16  Temp: 37.1 C  SpO2: 95%    Complications: No apparent anesthesia complications

## 2020-05-31 NOTE — Op Note (Signed)
PREOPERATIVE DIAGNOSIS: BPH with urinary retention  POSTOPERATIVE DIAGNOSIS: Same  PROCEDURES: 1. Robotic-assisted laparoscopic simple prostatectomy.  ANESTHESIA: General  ATTENDING: Nicolette Bang, MD  ASSISTANT: Clemetine Marker, PA  RESIDENT: Carmie Kanner, MD  ESTIMATED BLOOD LOSS: 300 mL.  COMPLICATIONS: None.  SPECIMEN: 1.prostatic adenoma  ANTIBIOTICS: ancef  FINDINGS: 3cm intravesical prostatic protrusion. Ureteral orifices in normal anatomic location. No leaks from cystotomy at 150cc of water. The assistant was utilized for suction, retraction, passing suture and closure.   DRAINS: 1. Jackson-Pratt drain to bulb suction. 2. Foley catheter to straight drain.  INDICATION: Johnny [name] is a very pleasant 65 year old gentleman, who has BPH with significant LUTS including urinary retention. His TRUS volume is 250cc.  Options were discussed with the patient in detail for primary manage including continued surveillance protocols versus surgical extirpation with and without minimally invasive assistance and he wished to proceed with robotic simple prostatectomy. Informed consent was obtained and placed in the medical record.  PROCEDURE IN DETAIL: The patient was brought to the operating room and a breif timeout was down to ensure correct patient, correct procedure, and correct site. Intravenous antibiotics were administered. General endotracheal anesthesia was introduced. The patient was placed into a low lithotomy position after tucking his arms with foam padding, placing on a pink and non-slide foam pad. A test of steep Trendelenburg positioning was performed and he was found to be suitably positioned. Sterile field was created by prepping and draping the patient's penis, perineum and proximal thighs using iodine and his infra-xiphoid abdomen using chlorhexidine gluconate. Next, a high-flow, low-pressure pneumoperitoneum was obtained using Veress technique in  the infraumbilical midline having passed the aspiration and drop test. Next, a 8-mm robotic camera port was placed in the same location. Laparoscopic examination of the peritoneal cavity revealed no significant adhesions and no visceral injury. Additional ports were placed as follows: Right paramedian 8-mm robotic port, right far lateral 12-mm assistant port, left paramedian 8-mm robotic port, left far lateral 8-mm robotic port, and right paramedian 5-mm suction port. Robot was docked and passed through the electronic checks. Next, attention was directed for the development of space of Retzius. Incision was made lateral to the left medial umbilical ligament from the midline towards the area of the internal ring and coursing along the iliac vessels towards the area of the ureter, which was positively identified. The left bladder was dissected away from the pelvic sidewall towards the area of the endopelvic fascia. A mirror-imaged dissection was performed on the right side.  Additional anterior attachments were taken down using cautery scissors. Next, the bladder neck was identified moving the Foley catheter back and forth. We then made a 6cm transverse cystotomy 3cm from the bladder neck. We identified the ureteral orifices and care was taken to exclude then from the dissection. We then placed 3 holding stitches in the anterior bladder wall and secured it to Coopers ligament.  We then made a circumscribing incision around the base of the prostate. We then used a 0 vicryl in a figure of eight fashion in the base of the adenoma for traction. We proceeded with a posterior dissection of the prostate until we identified the capsule. We then used a combination of electrocautery, blunt and sharp dissection to free the adenoma from the capsule. We then dissected laterally to the apex. Individual bleeders were cauterized. We then dissected anteriorly along the capsule until we reached the apex. The  adenoma was then freed and placed in an endocatch bag. We noted good hemostasis  and no additional sutures were placed. A Foley catheter was then placed per urethra easily. We then tacked down the bladder neck to the prostatic fossa with a single interrupted 2-0 vicryl. We then proceeded to closed the cystotomy. We closed the bladder with a running 0 vicryl full thickness. We then performed a imbricating second layer  Closure with 0 vicryl. The bladder was then filled with 150cc of water and we noted no leak.  All sponge and needle counts were correct. A closed suction drain was brought to the previous left lateral robotic port site into the area of the peritoneal cavity. The previous right 12-mm assistant port was closed at the level of the fascia using a Carter-Thomason suture passer under laparoscopic vision. Robot was undocked. Specimen was retrieved by extending the previous camera port site inferiorly for distance approximately 3 cm and removing the prostatectomy specimens and setting aside for permanent pathology. The site was closed at the level of fascia using figure-of-eight 0 vicryl followed by reapproximation of the Scarpa's using running Vicryl. All incision sites were infiltrated with dilute Lyophilized Marcaine and closed at the level of the skin using subcuticular Monocryl followed by Dermabond. Procedure was then terminated. The patient tolerated the procedure well. There were no immediate periprocedural complications and the patient was taken to the postanesthesia care unit in stable Condition.  COMPLICATIONS: None  CONDITION: Stable, extubated, transferred to PACU  PLAN: The patient will be admitted for 1-2 for hydration, post operative monitoring and pain control. He will be discharged home with foley in place and foley will be removed in 14 days, He will have a cystogram prior to foley catheter removal

## 2020-05-31 NOTE — H&P (Signed)
Urinary retention  HPI: Johnny [name] is a 64yo here for followup for BPH. He developed urinary retention on 11/23 and had a foley placed in the ER. CT from 11/23 revealed at 212cc prostate. He is currently on uroxatral 10mg  qhs. He was having moderate LUTS prior to presenting to the ER.   PMH:     Past Medical History:  Diagnosis Date  . Atrial fibrillation ([place])   . CKD (chronic kidney disease) stage 3, GFR 30-59 ml/min ([place]) 06/10/2017  . Constipation 03/27/2020  . Diabetes mellitus   . Dyslipidemia 08/20/2018  . Essential hypertension 08/03/2007   Qualifier: Diagnosis of  By: Linda Hedges MD, Heinz Knuckles  Med          ARB, diuretic    . Guttate psoriasis   . HTN (hypertension)   . Hyperlipidemia   . Hyperlipidemia associated with type 2 diabetes mellitus ([place]) 08/03/2007   Qualifier: Diagnosis of  By: Linda Hedges MD, Heinz Knuckles  meds - none   . 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 Sleep apnea   . Type II diabetes mellitus with renal manifestations, uncontrolled ([place]) 08/03/2007   Qualifier: Diagnosis of  By: Linda Hedges MD, Heinz Knuckles  Med          metformin     Surgical History:      Past Surgical History:  Procedure Laterality Date  . CARDIOVERSION N/A 07/18/2019   Procedure: CARDIOVERSION;  Surgeon: Dorothy Spark, MD;  Location: [facility] ENDOSCOPY;  Service: Cardiovascular;  Laterality: N/A;  . TEE WITHOUT CARDIOVERSION N/A 07/18/2019   Procedure: TRANSESOPHAGEAL ECHOCARDIOGRAM (TEE);  Surgeon: Dorothy Spark, MD;  Location: [facility] ENDOSCOPY;  Service: Cardiovascular;  Laterality: N/A;  . TOOTH EXTRACTION      Home Medications:  Allergies as of 04/05/2020   No Known Allergies        Medication List       Accurate as of April 05, 2020  9:14 AM. If you have any questions, ask your nurse or doctor.        alfuzosin 10 MG 24 hr tablet Commonly known as: UROXATRAL Take 1 tablet (10 mg total) by mouth daily with  breakfast.   ammonium lactate 12 % lotion Commonly known as: AmLactin Apply 1 application topically as needed for dry skin.   apixaban 5 MG Tabs tablet Commonly known as: ELIQUIS Take 1 tablet (5 mg total) by mouth 2 (two) times daily.   atorvastatin 80 MG tablet Commonly known as: LIPITOR Take 1 tablet (80 mg total) by mouth daily.   carvedilol 25 MG tablet Commonly known as: COREG Take 1 tablet (25 mg total) by mouth 2 (two) times daily with a meal.   furosemide 20 MG tablet Commonly known as: LASIX Take 1 tablet (20 mg total) by mouth daily.   glipiZIDE 5 MG tablet Commonly known as: GLUCOTROL Take 1 tablet (5 mg total) by mouth 2 (two) times daily before a meal.   isosorbide-hydrALAZINE 20-37.5 MG tablet Commonly known as: BIDIL Take 1 tablet by mouth 3 (three) times daily.   metFORMIN 500 MG 24 hr tablet Commonly known as: GLUCOPHAGE-XR TAKE 1 TABLET BY MOUTH TWICE A DAY What changed: when to take this   olmesartan-hydrochlorothiazide 40-12.5 MG tablet Commonly known as: BENICAR HCT TAKE 1 TABLET BY MOUTH EVERY DAY   Trulicity 1.5 MW/1.0UV Sopn Generic drug: Dulaglutide INJECT 1.5 MG INTO THE SKIN ONCE A WEEK.  Allergies: No Known Allergies  Family History:      Family History  Problem Relation Age of Onset  . Hypertension Father        DOB [birth year]  . Hyperlipidemia Father   . Diabetes Father   . Coronary artery disease Father        with stents  . Nephrolithiasis Father   . Kidney disease Father        s/p rejected kidney transplant; on HD  . Memory loss Mother        DOB 35  . Kidney disease Mother        s/p nephrectomy, infection problem  . Dementia Mother   . Cancer Brother        lung  . HIV Brother   . Prostate cancer Neg Hx   . Colon cancer Neg Hx     Social History:  reports that he is a non-smoker but has been exposed to tobacco smoke. He has never used smokeless tobacco. He reports that  he does not drink alcohol and does not use drugs.  ROS: All other review of systems were reviewed and are negative except what is noted above in HPI  Physical Exam: BP (!) 193/81   Pulse 80   Temp 98.9 F (37.2 C)   Ht 6\' 5"  (1.956 m)   Wt (!) 315 lb (142.9 kg)   BMI 37.35 kg/m   Constitutional:  Alert and oriented, No acute distress. HEENT: [place] AT, moist mucus membranes.  Trachea midline, no masses. Cardiovascular: No clubbing, cyanosis, or edema. Respiratory: Normal respiratory effort, no increased work of breathing. GI: Abdomen is soft, nontender, nondistended, no abdominal masses GU: No CVA tenderness.  Lymph: No cervical or inguinal lymphadenopathy. Skin: No rashes, bruises or suspicious lesions. Neurologic: Grossly intact, no focal deficits, moving all 4 extremities. Psychiatric: Normal mood and affect.  Laboratory Data: Recent Labs       Lab Results  Component Value Date   WBC 6.6 03/28/2020   HGB 13.7 03/28/2020   HCT 42.4 03/28/2020   MCV 87.6 03/28/2020   PLT 235 03/28/2020      Recent Labs  Lab Results  Component Value Date   CREATININE 1.97 (H) 03/28/2020      Recent Labs       Lab Results  Component Value Date   PSA 11.14 (H) 11/19/2012   PSA 7.47 (H) 11/04/2011   PSA 3.43 04/23/2009      Recent Labs  No results found for: TESTOSTERONE    Recent Labs       Lab Results  Component Value Date   HGBA1C 6.9 (A) 02/03/2020      Urinalysis Labs (Brief)          Component Value Date/Time   COLORURINE YELLOW 03/28/2020 1805   APPEARANCEUR HAZY (A) 03/28/2020 1805   LABSPEC 1.016 03/28/2020 1805   PHURINE 5.0 03/28/2020 1805   GLUCOSEU NEGATIVE 03/28/2020 1805   GLUCOSEU >=1000 04/23/2009 0911   HGBUR LARGE (A) 03/28/2020 1805   BILIRUBINUR NEGATIVE 03/28/2020 1805   BILIRUBINUR moderate 11/04/2019 1419   KETONESUR NEGATIVE 03/28/2020 1805   PROTEINUR 100 (A) 03/28/2020 1805   UROBILINOGEN negative  (A) 11/04/2019 1419   UROBILINOGEN 0.2 04/23/2009 0911   NITRITE NEGATIVE 03/28/2020 1805   LEUKOCYTESUR NEGATIVE 03/28/2020 1805      Recent Labs       Lab Results  Component Value Date   BACTERIA NONE SEEN 03/28/2020      Pertinent  Imaging: CT 11/23: Images reviewed and discussed with the patient  Results for orders placed during the hospital encounter of 09/20/19  DG Abdomen 1 View  Narrative CLINICAL DATA:  Constipation and trouble breathing  EXAM: ABDOMEN - 1 VIEW  COMPARISON:  None.  FINDINGS: The bowel gas pattern is normal. No abnormal stool retention. Pelvic calcifications on the right have the appearance of phlebolith. No abnormal osseous findings.  IMPRESSION: Normal bowel gas pattern.  No abnormal stool retention.   Electronically Signed By: Monte Fantasia M.D. On: 09/20/2019 05:09  No results found for this or any previous visit.  No results found for this or any previous visit.  No results found for this or any previous visit.  No results found for this or any previous visit.  No results found for this or any previous visit.  No results found for this or any previous visit.  No results found for this or any previous visit.   Assessment & Plan:    1. BPH with urinary obstruction -We discussed the management of BPH including medical therapy, indwelling foley, CIC, TURP, rezum, urolift and simple prostatectomy. Given he has a 212cc prostate on recent CT he would benefit from simple prostatectomy. After discussing the treatment options the patient wishes to proceed with robotic simple prostatectomy. Risks/benefits/alternatives discussed.

## 2020-05-31 NOTE — Plan of Care (Signed)
  Problem: Education: Goal: Knowledge of the procedure and recovery process will improve Outcome: Progressing   Problem: Bowel/Gastric: Goal: Gastrointestinal status for postoperative course will improve Outcome: Progressing   Problem: Pain Management: Goal: General experience of comfort will improve Outcome: Progressing   Problem: Skin Integrity: Goal: Demonstration of wound healing without infection will improve Outcome: Progressing   Problem: Urinary Elimination: Goal: Ability to avoid or minimize complications of infection will improve Outcome: Progressing Goal: Ability to achieve and maintain urine output will improve Outcome: Progressing Goal: Home care management will improve Outcome: Progressing

## 2020-05-31 NOTE — Anesthesia Postprocedure Evaluation (Signed)
Anesthesia Post Note  Patient: Johnny [name]  Procedure(s) Performed: XI ROBOTIC ASSISTED SIMPLE PROSTATECTOMY-SUPRAPUBIC (N/A )     Patient location during evaluation: PACU Anesthesia Type: General Level of consciousness: awake and alert Pain management: pain level controlled Vital Signs Assessment: post-procedure vital signs reviewed and stable Respiratory status: spontaneous breathing, nonlabored ventilation, respiratory function stable and patient connected to nasal cannula oxygen Cardiovascular status: blood pressure returned to baseline and stable Postop Assessment: no apparent nausea or vomiting Anesthetic complications: no   No complications documented.  Last Vitals:  Vitals:   05/31/20 1145 05/31/20 1200  BP: (!) 181/105 (!) 176/99  Pulse: 70 69  Resp: 14 13  Temp:    SpO2: 100% 100%    Last Pain:  Vitals:   05/31/20 1207  TempSrc:   PainSc: 6                  Barnet Glasgow

## 2020-06-01 ENCOUNTER — Encounter (HOSPITAL_COMMUNITY): Payer: Self-pay | Admitting: Urology

## 2020-06-01 LAB — GLUCOSE, CAPILLARY
Glucose-Capillary: 104 mg/dL — ABNORMAL HIGH (ref 70–99)
Glucose-Capillary: 116 mg/dL — ABNORMAL HIGH (ref 70–99)
Glucose-Capillary: 142 mg/dL — ABNORMAL HIGH (ref 70–99)
Glucose-Capillary: 108 mg/dL — ABNORMAL HIGH (ref 70–99)

## 2020-06-01 LAB — HEMOGLOBIN AND HEMATOCRIT, BLOOD
HCT: 39.3 % (ref 39.0–52.0)
Hemoglobin: 12.5 g/dL — ABNORMAL LOW (ref 13.0–17.0)

## 2020-06-01 LAB — BASIC METABOLIC PANEL
Anion gap: 12 (ref 5–15)
BUN: 29 mg/dL — ABNORMAL HIGH (ref 8–23)
CO2: 23 mmol/L (ref 22–32)
Calcium: 8.2 mg/dL — ABNORMAL LOW (ref 8.9–10.3)
Chloride: 101 mmol/L (ref 98–111)
Creatinine, Ser: 1.88 mg/dL — ABNORMAL HIGH (ref 0.61–1.24)
GFR, Estimated: 40 mL/min — ABNORMAL LOW (ref >60)
Glucose, Bld: 131 mg/dL — ABNORMAL HIGH (ref 70–99)
Potassium: 4.6 mmol/L (ref 3.5–5.1)
Sodium: 136 mmol/L (ref 135–145)

## 2020-06-01 LAB — SURGICAL PATHOLOGY

## 2020-06-01 MED ORDER — OXYCODONE HCL 5 MG PO TABS
5.0000 mg | ORAL_TABLET | ORAL | Status: DC | PRN
  Administered 2020-06-01 – 2020-06-02 (×2): 10 mg via ORAL
  Administered 2020-06-02: 5 mg via ORAL
  Administered 2020-06-02 (×2): 10 mg via ORAL
  Administered 2020-06-03 (×2): 5 mg via ORAL
  Filled 2020-06-01 (×4): qty 2
  Filled 2020-06-01: qty 1
  Filled 2020-06-01: qty 2
  Filled 2020-06-01: qty 1

## 2020-06-01 MED ORDER — HYDROMORPHONE HCL 1 MG/ML IJ SOLN: 0.5000 mg | INTRAMUSCULAR | Status: DC | PRN

## 2020-06-01 MED ORDER — CHLORHEXIDINE GLUCONATE CLOTH 2 % EX PADS
6.0000 | MEDICATED_PAD | Freq: Every day | CUTANEOUS | Status: DC
  Administered 2020-06-01 – 2020-06-03 (×3): 6 via TOPICAL

## 2020-06-01 NOTE — Progress Notes (Signed)
1 Day Post-Op Subjective: The patient is doing well. Required catheter irrigation once overnight. No nausea or vomiting. Pain is adequately controlled. On O2 at night (wears CPAP at home).  75cc from drain  Objective: Vital signs in last 24 hours: Temp:  [97.6 F (36.4 C)-98.6 F (37 C)] 98.6 F (37 C) (01/28 0414) Pulse Rate:  [62-82] 82 (01/28 0414) Resp:  [10-16] 16 (01/28 0414) BP: (103-181)/(53-120) 123/64 (01/28 0414) SpO2:  [95 %-100 %] 98 % (01/28 0414) Weight:  [644 kg] 138 kg (01/27 1539)  Intake/Output from previous day: 01/27 0701 - 01/28 0700 In: 47083.8 [P.O.:240; I.V.:3243.8; IV Piggyback:1000] Out: [id number] [IHKVQ:25956; Drains:75; Blood:300] Intake/Output this shift: Total I/O In: 3000 [Other:3000] Out: 500 [Urine:500]  Physical Exam:  General: Alert and oriented. CV: RRR Lungs: Clear bilaterally. On O2 GI: Soft, Nondistended. Incisions: Clean and dry. JP drain with scant serosanguinous output Urine: Clear light pink on moderate CBI drip Extremities: Nontender, no erythema, no edema.  Lab Results: Recent Labs    05/31/20 1134 06/01/20 0605  HGB 13.6 12.5*  HCT 43.7 39.3          Recent Labs    06/01/20 0605  CREATININE 1.88*           Results for orders placed or performed during the hospital encounter of 05/31/20 (from the past 24 hour(s))  Hemoglobin and hematocrit, blood     Status: None   Collection Time: 05/31/20 11:34 AM  Result Value Ref Range   Hemoglobin 13.6 13.0 - 17.0 g/dL   HCT 43.7 39.0 - 52.0 %  Glucose, capillary     Status: Abnormal   Collection Time: 05/31/20 11:34 AM  Result Value Ref Range   Glucose-Capillary 122 (H) 70 - 99 mg/dL  Glucose, capillary     Status: Abnormal   Collection Time: 05/31/20  5:00 PM  Result Value Ref Range   Glucose-Capillary 128 (H) 70 - 99 mg/dL  Glucose, capillary     Status: Abnormal   Collection Time: 05/31/20  8:20 PM  Result Value Ref Range   Glucose-Capillary 133 (H) 70 - 99 mg/dL   Basic metabolic panel     Status: Abnormal   Collection Time: 06/01/20  6:05 AM  Result Value Ref Range   Sodium 136 135 - 145 mmol/L   Potassium 4.6 3.5 - 5.1 mmol/L   Chloride 101 98 - 111 mmol/L   CO2 23 22 - 32 mmol/L   Glucose, Bld 131 (H) 70 - 99 mg/dL   BUN 29 (H) 8 - 23 mg/dL   Creatinine, Ser 1.88 (H) 0.61 - 1.24 mg/dL   Calcium 8.2 (L) 8.9 - 10.3 mg/dL   GFR, Estimated 40 (L) >60 mL/min   Anion gap 12 5 - 15  Hemoglobin and hematocrit, blood     Status: Abnormal   Collection Time: 06/01/20  6:05 AM  Result Value Ref Range   Hemoglobin 12.5 (L) 13.0 - 17.0 g/dL   HCT 39.3 39.0 - 52.0 %  Glucose, capillary     Status: Abnormal   Collection Time: 06/01/20  7:22 AM  Result Value Ref Range   Glucose-Capillary 104 (H) 70 - 99 mg/dL    Assessment/Plan: POD# 1 s/p robotic assisted suprapubic simple prostatectomy  1) Ambulate, Incentive spirometry 2) Advance diet as tolerated 3) Transition to oral pain medication 4) Clamp CBI this morning, re-evaluate 5) Patient will continue foley on discharge, will need foley teaching.  6) JP drain out prior to discharge  LOS: 1 day   Carmie Kanner 06/01/2020, 8:21 AM

## 2020-06-02 LAB — GLUCOSE, CAPILLARY
Glucose-Capillary: 114 mg/dL — ABNORMAL HIGH (ref 70–99)
Glucose-Capillary: 182 mg/dL — ABNORMAL HIGH (ref 70–99)
Glucose-Capillary: 147 mg/dL — ABNORMAL HIGH (ref 70–99)
Glucose-Capillary: 217 mg/dL — ABNORMAL HIGH (ref 70–99)

## 2020-06-02 MED ORDER — SULFAMETHOXAZOLE-TRIMETHOPRIM 800-160 MG PO TABS
1.0000 | ORAL_TABLET | Freq: Two times a day (BID) | ORAL | Status: DC
  Administered 2020-06-02 – 2020-06-03 (×3): 1 via ORAL
  Filled 2020-06-02 (×3): qty 1

## 2020-06-02 NOTE — Progress Notes (Signed)
2 Days Post-Op   Subjective/Chief Complaint:  1 - Enlarged Prostate - s/p robotic simple prostatecdtomy 05/31/20 by McKenzie. Path benign. On continuous bladder irrigation initially. Remaining on finasteride post-op to help with bleeding / regrowth.   Today " [name]" is stable. JP output scant. Weaned CBI to off earlier in the day, but urine became fairly thick again. No complaints. Ambulatory, patin controlled.    Objective: Vital signs in last 24 hours: Temp:  [98 F (36.7 C)-99.2 F (37.3 C)] 99.2 F (37.3 C) (01/29 0451) Pulse Rate:  [71-89] 89 (01/29 0451) Resp:  [14-18] 18 (01/29 0451) BP: (123-151)/(69-79) 146/74 (01/29 0451) SpO2:  [94 %-96 %] 94 % (01/29 0451) Last BM Date: 05/30/20  Intake/Output from previous day: 01/28 0701 - 01/29 0700 In: 6900  Out: 9125 [Urine:9075; Drains:50] Intake/Output this shift: No intake/output data recorded.  General appearance: alert and cooperative Eyes: conjunctivae/corneas clear. PERRL, EOM's intact. Fundi benign. Nose: Nares normal. Septum midline. Mucosa normal. No drainage or sinus tenderness. Throat: lips, mucosa, and tongue normal; teeth and gums normal Back: symmetric, no curvature. ROM normal. No CVA tenderness. Resp: non-labored on room air.  Cardio: no rub GI: stable moderate obesity. JP with scant non-foul serous output. Recent port / extraction sites c/d/i.  Male genitalia: normal, urine thin this AM, but thick in PM with holding CBI. 2 strap traction palced and CBI restarted.  Extremities: extremities normal, atraumatic, no cyanosis or edema Skin: Skin color, texture, turgor normal. No rashes or lesions or dry skin v. psorriasis changes w/o superinfection on legs.  Lymph nodes: Cervical, supraclavicular, and axillary nodes normal. Neurologic: Grossly normal Extremely pleasant.   Lab Results:  Recent Labs    05/31/20 1134 06/01/20 0605  HGB 13.6 12.5*  HCT 43.7 39.3   BMET Recent Labs    06/01/20 0605  NA 136   K 4.6  CL 101  CO2 23  GLUCOSE 131*  BUN 29*  CREATININE 1.88*  CALCIUM 8.2*   PT/INR No results for input(s): LABPROT, INR in the last 72 hours. ABG No results for input(s): PHART, HCO3 in the last 72 hours.  Invalid input(s): PCO2, PO2  Studies/Results: No results found.  Anti-infectives: Anti-infectives (From admission, onward)   Start     Dose/Rate Route Frequency Ordered Stop   05/31/20 0600  ceFAZolin (ANCEF) 3 g in dextrose 5 % 50 mL IVPB        3 g 100 mL/hr over 30 Minutes Intravenous 30 min pre-op 05/30/20 0737 05/31/20 0802   05/31/20 0000  sulfamethoxazole-trimethoprim (BACTRIM DS) 800-160 MG tablet        1 tablet Oral 2 times daily 05/31/20 1103        Assessment/Plan:  1 - Enlarged Prostate - doing well POD 2. Wean CBI. Begin proph bactrim. DC criteria discussed. As did not tolerate CBI off, catheter placed on "strap traction" and explained to pt. Will keep on light traction rest of day today / tonight, re-attempt CBI wean in AM.   Johnny [name] 06/02/2020

## 2020-06-02 NOTE — Plan of Care (Signed)
  Problem: Education: Goal: Knowledge of the procedure and recovery process will improve Outcome: Progressing   Problem: Bowel/Gastric: Goal: Gastrointestinal status for postoperative course will improve Outcome: Progressing   Problem: Pain Management: Goal: General experience of comfort will improve Outcome: Progressing   

## 2020-06-03 LAB — HEMOGLOBIN AND HEMATOCRIT, BLOOD
HCT: 35.3 % — ABNORMAL LOW (ref 39.0–52.0)
Hemoglobin: 11.3 g/dL — ABNORMAL LOW (ref 13.0–17.0)

## 2020-06-03 LAB — GLUCOSE, CAPILLARY
Glucose-Capillary: 116 mg/dL — ABNORMAL HIGH (ref 70–99)
Glucose-Capillary: 145 mg/dL — ABNORMAL HIGH (ref 70–99)

## 2020-06-03 NOTE — Discharge Summary (Signed)
Physician Discharge Summary  Patient ID: Johnny [name] MRN: [id number] DOB/AGE: 07-03-1955 65 y.o.  Admit date: 05/31/2020 Discharge date: 06/03/2020  Admission Diagnoses: Prostatic Hypertrophy with Refractory Urinary Retention  Discharge Diagnoses:  Active Problems:   BPH with obstruction/lower urinary tract symptoms   Discharged Condition: good  Hospital Course:   1 - Enlarged Prostate - s/p robotic simple prostatecdtomy 05/31/20 by McKenzie. Path benign. On continuous bladder irrigation initially. Remaining on finasteride post-op to help with bleeding / regrowth. Bladder irrigation slowly weaned to off. By the afternoon of POD 3, the day of discharge, he is ambulatory, pain controlled on PO meds, maintainin PO nutrition / hydration, urine w/o clots off irrigation, and felt to be adequate for discharge. Hgb 11.3, Cr 1.8 (at baseline), path pending at discharge.    Consults: None  Significant Diagnostic Studies: labs: as per above  Treatments: surgery: as per above  Discharge Exam: Blood pressure 140/73, pulse 76, temperature 98.6 F (37 C), temperature source Oral, resp. rate 20, height 6\' 5"  (1.956 m), weight (!) 138 kg, SpO2 93 %.  General appearance: alert and cooperative. Family at bedside.  Eyes: conjunctivae/corneas clear.  Nose: Nares normal. Septum midline. Mucosa normal. No drainage or sinus tenderness. Throat: lips, mucosa, and tongue normal; teeth and gums normal Back: symmetric, no curvature. ROM normal. No CVA tenderness. Resp: non-labored on room air.  Cardio: no rub GI: stable moderate obesity. JP with scant non-foul serous output. Recent port / extraction sites c/d/i.  Male genitalia: UNcirc'd with reduce foreskin. 3 way foley in place with irrigation port plugged and thin light purple urine. JP with scant non-foul serosanguinous output, removed and dry dressing placed.  Extremities: extremities normal, atraumatic, no cyanosis or edema Skin: Skin color,  texture, turgor normal. No rashes or lesions or dry skin v. psorriasis changes w/o superinfection on legs.  Lymph nodes: Cervical, supraclavicular, and axillary nodes normal. Neurologic: Grossly normal Extremely pleasant.   Disposition: HOME     Follow-up Information    Cleon Gustin, MD On 06/13/2020.   Specialty: Urology Why: at 10:30 Contact information: [place] [id number] [phone]               Signed: Alexis Frock 06/03/2020, 2:46 PM

## 2020-06-03 NOTE — Progress Notes (Signed)
Reviewed discharge information with patient and wife. Answered all questions. Pt able to teach back medications and reasons to contact MD/911. Patient verbalizes importance of urology follow up appointment.   Barbee Shropshire. Brigitte Pulse, RN

## 2020-06-06 ENCOUNTER — Ambulatory Visit: Payer: BC Managed Care – PPO | Admitting: Podiatry

## 2020-06-06 ENCOUNTER — Other Ambulatory Visit: Payer: Self-pay

## 2020-06-06 ENCOUNTER — Encounter: Payer: Self-pay | Admitting: Podiatry

## 2020-06-06 DIAGNOSIS — B351 Tinea unguium: Secondary | ICD-10-CM | POA: Diagnosis not present

## 2020-06-06 DIAGNOSIS — M79675 Pain in left toe(s): Secondary | ICD-10-CM

## 2020-06-06 DIAGNOSIS — M79674 Pain in right toe(s): Secondary | ICD-10-CM | POA: Diagnosis not present

## 2020-06-06 DIAGNOSIS — E1142 Type 2 diabetes mellitus with diabetic polyneuropathy: Secondary | ICD-10-CM | POA: Diagnosis not present

## 2020-06-08 ENCOUNTER — Ambulatory Visit: Payer: BC Managed Care – PPO | Admitting: Internal Medicine

## 2020-06-08 ENCOUNTER — Other Ambulatory Visit: Payer: Self-pay

## 2020-06-08 ENCOUNTER — Encounter: Payer: Self-pay | Admitting: Internal Medicine

## 2020-06-08 VITALS — BP 110/70 | HR 80 | Ht 77.0 in | Wt 301.4 lb

## 2020-06-08 DIAGNOSIS — N1831 Chronic kidney disease, stage 3a: Secondary | ICD-10-CM

## 2020-06-08 DIAGNOSIS — E1165 Type 2 diabetes mellitus with hyperglycemia: Secondary | ICD-10-CM | POA: Diagnosis not present

## 2020-06-08 DIAGNOSIS — E1122 Type 2 diabetes mellitus with diabetic chronic kidney disease: Secondary | ICD-10-CM

## 2020-06-08 DIAGNOSIS — E1142 Type 2 diabetes mellitus with diabetic polyneuropathy: Secondary | ICD-10-CM | POA: Diagnosis not present

## 2020-06-08 LAB — POCT GLYCOSYLATED HEMOGLOBIN (HGB A1C): Hemoglobin A1C: 7.6 % — AB (ref 4.0–5.6)

## 2020-06-08 NOTE — Patient Instructions (Addendum)
-   Continue Metformin 500 mg 1 tablet with Breakfast and 1 tablet with supper - Continue Glipizide 5 mg, 1 tablet before breakfast and Supper - Continue  Trulicity 1.5 mg once a week         - HOW TO TREAT LOW BLOOD SUGARS (Blood sugar LESS THAN 70 MG/DL)  Please follow the RULE OF 15 for the treatment of hypoglycemia treatment (when your (blood sugars are less than 70 mg/dL)    STEP 1: Take 15 grams of carbohydrates when your blood sugar is low, which includes:   3-4 GLUCOSE TABS  OR  3-4 OZ OF JUICE OR REGULAR SODA OR  ONE TUBE OF GLUCOSE GEL     STEP 2: RECHECK blood sugar in 15 MINUTES STEP 3: If your blood sugar is still low at the 15 minute recheck --> then, go back to STEP 1 and treat AGAIN with another 15 grams of carbohydrates.

## 2020-06-08 NOTE — Progress Notes (Signed)
Name: Johnny [name]  Age/ Sex: 65 y.o., male   MRN/ DOB: [id number], 09-11-1955     PCP: [name], MD   Reason for Endocrinology Evaluation: Type 2 Diabetes Mellitus  Initial Endocrine Consultative Visit: 08/20/2018    PATIENT IDENTIFIER: Johnny [name] is a 65 y.o. male with a past medical history of T2DM, psoriasis, HTN and OSA (Dx 08/2019) . The patient has followed with Endocrinology clinic since 08/20/2018 for consultative assistance with management of his diabetes.  DIABETIC HISTORY:  Johnny [name] was diagnosed with T2DM in 2012,. He was on Metformin,Farxiga and Glimepiride in the past. He has never been on insulin. His hemoglobin A1c has ranged from 9.0 % in 2013, peaking at 13.5% in 06/2018.  On his initial visit to our clinic his A1c was 13.5%. He was not taking any of his meds. He was restarted on Metformin and glipizide.   Trulicity was started 46/5035  Hyperaldosteronism was ruled out in 09/2018 for extreme HTN  SUBJECTIVE:   During the last visit (02/03/2020): A1c 6.9 %.  we continued metformin and decreased Glipizide and increased Trulicity     Today (08/09/5679): Johnny [name] is here for a  follow up on his diabetes.  He checks his blood sugars 2 times daily, preprandial to breakfast and supper. The patient has had hypoglycemic episodes since the last clinic visit.   S/P prostatectomy 05/31/2020 - having right abdominal pain Denies nausea and diarrhea   On the CPAP machine   HOME DIABETES REGIMEN:   Metformin 500 mg XR 1 tablets BID with meals  Glipizide 5 mg,  1 tablets BID with meals  Trulicity 1.5 mg weekly ( Saturday )     METER DOWNLOAD SUMMARY: Did not being      HISTORY:  Past Medical History:  Past Medical History:  Diagnosis Date  . CKD (chronic kidney disease) stage 3, GFR 30-59 ml/min ([place]) 06/10/2017  . Constipation 03/27/2020  . Diabetes mellitus   . Dysrhythmia    A flutter  . Essential hypertension 08/03/2007    Qualifier: Diagnosis of  By: Linda Hedges MD, Heinz Knuckles  Med ARB, diuretic    . Guttate psoriasis   . Hyperlipidemia associated with type 2 diabetes mellitus ([place]) 08/03/2007   Qualifier: Diagnosis of  By: Linda Hedges MD, Heinz Knuckles  meds - none   . Paroxysmal atrial flutter ([place])    S/P TEE/DCCV 07/2019  . 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 Sleep apnea    No CPAP  . Type II diabetes mellitus with renal manifestations, uncontrolled ([place]) 08/03/2007   Qualifier: Diagnosis of  By: Linda Hedges MD, Heinz Knuckles  Med metformin    Past Surgical History:  Past Surgical History:  Procedure Laterality Date  . CARDIOVERSION N/A 07/18/2019   Procedure: CARDIOVERSION;  Surgeon: Dorothy Spark, MD;  Location: [facility] ENDOSCOPY;  Service: Cardiovascular;  Laterality: N/A;  . TEE WITHOUT CARDIOVERSION N/A 07/18/2019   Procedure: TRANSESOPHAGEAL ECHOCARDIOGRAM (TEE);  Surgeon: Dorothy Spark, MD;  [facility] ENDOSCOPY;  Service: Cardiovascular;  Laterality: N/A;  . TOOTH EXTRACTION    . XI ROBOTIC ASSISTED SIMPLE PROSTATECTOMY N/A 05/31/2020   Procedure: XI ROBOTIC ASSISTED SIMPLE PROSTATECTOMY-SUPRAPUBIC;  Surgeon: Cleon Gustin, MD;  Location: [facility] ORS;  Service: Urology;  Laterality: N/A;    Social History:  reports that he is a non-smoker but has been exposed to tobacco smoke. He has never used smokeless tobacco. He  reports that he does not drink alcohol and does not use drugs. Family History:  Family History  Problem Relation Age of Onset  . Hypertension Father        DOB [birth year]  . Hyperlipidemia Father   . Diabetes Father   . Coronary artery disease Father        with stents  . Nephrolithiasis Father   . Kidney disease Father        s/p rejected kidney transplant; on HD  . Memory loss Mother        DOB 81  . Kidney disease Mother        s/p nephrectomy, infection problem  . Dementia Mother   . Cancer Brother        lung  . HIV Brother   .  Prostate cancer Neg Hx   . Colon cancer Neg Hx      HOME MEDICATIONS: Allergies as of 06/08/2020   No Known Allergies     Medication List       Accurate as of June 08, 2020  3:10 PM. If you have any questions, ask your nurse or doctor.        ammonium lactate 12 % lotion Commonly known as: AmLactin Apply 1 application topically as needed for dry skin.   atorvastatin 80 MG tablet Commonly known as: LIPITOR Take 1 tablet (80 mg total) by mouth daily.   carvedilol 25 MG tablet Commonly known as: COREG Take 1 tablet (25 mg total) by mouth 2 (two) times daily with a meal.   finasteride 5 MG tablet Commonly known as: PROSCAR Take 1 tablet (5 mg total) by mouth daily.   glipiZIDE 5 MG tablet Commonly known as: GLUCOTROL Take 1 tablet (5 mg total) by mouth 2 (two) times daily before a meal.   HYDROcodone-acetaminophen 5-325 MG tablet Commonly known as: Norco Take 1-2 tablets by mouth every 6 (six) hours as needed for moderate pain.   isosorbide-hydrALAZINE 20-37.5 MG tablet Commonly known as: BIDIL Take 1 tablet by mouth 3 (three) times daily.   metFORMIN 500 MG 24 hr tablet Commonly known as: GLUCOPHAGE-XR TAKE 1 TABLET BY MOUTH TWICE A DAY   olmesartan-hydrochlorothiazide 40-12.5 MG tablet Commonly known as: BENICAR HCT TAKE 1 TABLET BY MOUTH EVERY DAY   sulfamethoxazole-trimethoprim 800-160 MG tablet Commonly known as: BACTRIM DS Take 1 tablet by mouth 2 (two) times daily. Start the day prior to foley removal appointment   Trulicity 1.5 BJ/6.2GB Sopn Generic drug: Dulaglutide INJECT 1.5 MG INTO THE SKIN ONCE A WEEK.        OBJECTIVE:   Vital Signs: Pulse 80   Ht 6\' 5"  (1.956 m)   Wt (!) 301 lb 6.4 oz (136.7 kg)   SpO2 97%   BMI 35.74 kg/m   Wt Readings from Last 3 Encounters:  06/08/20 (!) 301 lb 6.4 oz (136.7 kg)  05/31/20 (!) 304 lb 3.8 oz (138 kg)  05/23/20 (!) 305 lb (138.3 kg)     Exam: General: Pt appears well and is in NAD  Lungs:  Clear with good BS bilat with no rales, rhonchi, or wheezes  Heart: RRR with normal S1 and S2 and no gallops; no murmurs; no rub  Extremities: 1+ pretibial edema.  Skin: Normal texture and temperature to palpation. No rash noted.  Neuro: MS is good with appropriate affect, pt is alert and Ox3    DM foot exam: 02/03/2020 The skin of the feet is without sores or ulcerations, but has  extensive plantar callous formation bilaterally.Nails thickened, discolored and curved The pedal pulses are undetectable today due to edema  The sensation is decreased to a screening 5.07, 10 gram monofilament bilaterally           DATA REVIEWED:  Lab Results  Component Value Date   HGBA1C 8.2 (H) 05/23/2020   HGBA1C 6.9 (A) 02/03/2020   HGBA1C 7.9 (A) 10/31/2019   Lab Results  Component Value Date   MICROALBUR 9.8 (H) 02/03/2020   LDLCALC 79 09/05/2019   CREATININE 1.88 (H) 06/01/2020    In -Office BG 76 mg/dL  ASSESSMENT / PLAN / RECOMMENDATIONS:   1) Type 2 Diabetes Mellitus, Sub- Optimally controlled, With CKD III and neuropathic  complications - Most recent A1c of 7.6 %. Goal A1c < 7.0 %.   - His A1c has trended up but now its on its way down, recent prostatectomy, he also got his glipizide dose mixed up and was taking 2.5 mg dose rather then 5 mg dose BID, but he has corrected this  - His in-office BG 76 mg/dL  - NO changes today    MEDICATIONS:  Continue Metformin 500 mg BID   Continue Glipizide  5 mg, 1 Tablet before Breakfast and 1 tablet before supper   Continue Trulicity 1.5 mg weekly   EDUCATION / INSTRUCTIONS:  BG monitoring instructions: Patient is instructed to check his blood sugars 2 times a day, fasting and bedtime.  Call [facility] if: BG persistently < 70  . I reviewed the Rule of 15 for the treatment of hypoglycemia in detail with the patient. Literature supplied.    F/U in 4 months    Signed electronically by: Mack Guise,  MD  [facility] Endocrinology  [address] [place], [place] [address] Phone: [phone] FAX: [phone]   CC: [name], [place] [address] Phone: [phone]  Fax: [phone]  Return to Endocrinology clinic as below: Future Appointments  Date Time Provider [place]  06/13/2020 10:30 AM McKenzie, Candee Furbish, MD [facility] None  09/07/2020  1:15 PM Felipa Furnace, DPM [facility]

## 2020-06-12 ENCOUNTER — Encounter: Payer: Self-pay | Admitting: Podiatry

## 2020-06-12 NOTE — Progress Notes (Signed)
  Subjective:  Patient ID: Johnny [name], male    DOB: [birth year],  MRN: [id number]  Chief Complaint  Patient presents with  . Debridement    Trim toenails   65 y.o. male returns for the above complaint.  Patient presents with thickened elongated dystrophic toenails x10.  Patient is a painful to walk on.  Patient would like to have them debrided down as he is not able to do it himself.  Patient is a diabetic with last A1c of 6.9.  He does not have any secondary complaints.  Objective:  There were no vitals filed for this visit. Podiatric Exam: Vascular: dorsalis pedis and posterior tibial pulses are palpable bilateral. Capillary return is immediate. Temperature gradient is WNL. Skin turgor WNL  Sensorium: Normal Semmes Weinstein monofilament test. Normal tactile sensation bilaterally. Nail Exam: Pt has thick disfigured discolored nails with subungual debris noted bilateral entire nail hallux through fifth toenails.  Pain on palpation to the nails. Ulcer Exam: There is no evidence of ulcer or pre-ulcerative changes or infection. Orthopedic Exam: Muscle tone and strength are WNL. No limitations in general ROM. No crepitus or effusions noted. HAV  B/L.  Hammer toes 2-5  B/L. Skin: No Porokeratosis. No infection or ulcers.  Bilateral severe xerosis noted to bilateral lower extremity    Assessment & Plan:   1. Type 2 diabetes mellitus with diabetic polyneuropathy, without long-term current use of insulin (HCC)   2. Pain due to onychomycosis of toenails of both feet     Patient was evaluated and treated and all questions answered.  Xerosis skin severe bilateral lower extremity -I explained to the patient the etiology of xerosis and various treatment options were extensively discussed.  I explained to the patient the importance of maintaining moisturization of the skin with application of over-the-counter lotion such as Eucerin or Luciderm which she has failed.  I believe patient will  benefit from prescription lotion.  Ammonium lactate was sent to the pharmacy.  I have asked him to apply twice a day.  Patient states understanding   Onychomycosis with pain  -Nails palliatively debrided as below. -Educated on self-care  Procedure: Nail Debridement Rationale: pain  Type of Debridement: manual, sharp debridement. Instrumentation: Nail nipper, rotary burr. Number of Nails: 10  Procedures and Treatment: Consent by patient was obtained for treatment procedures. The patient understood the discussion of treatment and procedures well. All questions were answered thoroughly reviewed. Debridement of mycotic and hypertrophic toenails, 1 through 5 bilateral and clearing of subungual debris. No ulceration, no infection noted.  Return Visit-Office Procedure: Patient instructed to return to the office for a follow up visit 3 months for continued evaluation and treatment.  Boneta Lucks, DPM    No follow-ups on file.

## 2020-06-13 ENCOUNTER — Ambulatory Visit (INDEPENDENT_AMBULATORY_CARE_PROVIDER_SITE_OTHER): Payer: BC Managed Care – PPO | Admitting: Urology

## 2020-06-13 ENCOUNTER — Encounter: Payer: Self-pay | Admitting: Urology

## 2020-06-13 ENCOUNTER — Ambulatory Visit (HOSPITAL_COMMUNITY)
Admission: RE | Admit: 2020-06-13 | Discharge: 2020-06-13 | Disposition: A | Discharge status: Home / Self Care | Payer: BC Managed Care – PPO | Source: Ambulatory Visit | Attending: Urology | Admitting: Urology

## 2020-06-13 ENCOUNTER — Other Ambulatory Visit: Payer: Self-pay

## 2020-06-13 ENCOUNTER — Ambulatory Visit: Payer: BC Managed Care – PPO | Admitting: Urology

## 2020-06-13 VITALS — BP 157/79 | HR 80 | Temp 99.3°F | Ht 77.0 in | Wt 300.0 lb

## 2020-06-13 DIAGNOSIS — N401 Enlarged prostate with lower urinary tract symptoms: Secondary | ICD-10-CM | POA: Diagnosis present

## 2020-06-13 DIAGNOSIS — N138 Other obstructive and reflux uropathy: Secondary | ICD-10-CM

## 2020-06-13 NOTE — Progress Notes (Signed)

## 2020-06-13 NOTE — Progress Notes (Signed)
Fill and Pull Catheter Removal  Patient is present today for a catheter removal.  Patient was cleaned and prepped in a sterile fashion 0 ml of sterile water/ saline was instilled into the bladder. No sterile water would go into the pts bladder. Attempted twice. A bit of blood came up into tube both times. Informed Dr. Alyson Ingles and he said to pull catheter and have pt return on Friday morning for a nv and PVR.  A 16FR foley cath was removed from the bladder no complications were noted .  Patient as then given some time to void on their own.  Patient cannot void on their own after some time.  Patient tolerated well.  Performed by: Delshawn Stech,LPN  Follow up/ Additional notes: 2 days

## 2020-06-13 NOTE — Progress Notes (Signed)
06/13/2020 2:54 PM   Johnny [name] 12/31/1955 [id number]  Referring provider: [name], MD [address] [place],  [place] [address]  followup simple prostatectomy  HPI: Mr Johnny [name] is a 65yo here for followup after simple prostatectomy. Pathology benign. Cystogram showed no leak. Voiding trial passed today. No drainage or pain from his incisions   PMH: Past Medical History:  Diagnosis Date  . CKD (chronic kidney disease) stage 3, GFR 30-59 ml/min ([place]) 06/10/2017  . Constipation 03/27/2020  . Diabetes mellitus   . Dysrhythmia    A flutter  . Essential hypertension 08/03/2007   Qualifier: Diagnosis of  By: Linda Hedges MD, Heinz Knuckles  Med ARB, diuretic    . Guttate psoriasis   . Hyperlipidemia associated with type 2 diabetes mellitus ([place]) 08/03/2007   Qualifier: Diagnosis of  By: Linda Hedges MD, Heinz Knuckles  meds - none   . Paroxysmal atrial flutter ([place])    S/P TEE/DCCV 07/2019  . 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 Sleep apnea    No CPAP  . Type II diabetes mellitus with renal manifestations, uncontrolled ([place]) 08/03/2007   Qualifier: Diagnosis of  By: Linda Hedges MD, Heinz Knuckles  Med metformin     Surgical History: Past Surgical History:  Procedure Laterality Date  . CARDIOVERSION N/A 07/18/2019   Procedure: CARDIOVERSION;  Surgeon: Dorothy Spark, MD;  [facility] ENDOSCOPY;  Service: Cardiovascular;  Laterality: N/A;  . TEE WITHOUT CARDIOVERSION N/A 07/18/2019   Procedure: TRANSESOPHAGEAL ECHOCARDIOGRAM (TEE);  Surgeon: Dorothy Spark, MD;  Location: [facility] ENDOSCOPY;  Service: Cardiovascular;  Laterality: N/A;  . TOOTH EXTRACTION    . XI ROBOTIC ASSISTED SIMPLE PROSTATECTOMY N/A 05/31/2020   Procedure: XI ROBOTIC ASSISTED SIMPLE PROSTATECTOMY-SUPRAPUBIC;  Surgeon: Cleon Gustin, MD;  Location[facility] ORS;  Service: Urology;  Laterality: N/A;    Home Medications:  Allergies as of 06/13/2020   No Known Allergies      Medication List       Accurate as of June 13, 2020  2:54 PM. If you have any questions, ask your nurse or doctor.        ammonium lactate 12 % lotion Commonly known as: AmLactin Apply 1 application topically as needed for dry skin.   atorvastatin 80 MG tablet Commonly known as: LIPITOR Take 1 tablet (80 mg total) by mouth daily.   carvedilol 25 MG tablet Commonly known as: COREG Take 1 tablet (25 mg total) by mouth 2 (two) times daily with a meal.   finasteride 5 MG tablet Commonly known as: PROSCAR Take 1 tablet (5 mg total) by mouth daily.   glipiZIDE 5 MG tablet Commonly known as: GLUCOTROL Take 1 tablet (5 mg total) by mouth 2 (two) times daily before a meal.   HYDROcodone-acetaminophen 5-325 MG tablet Commonly known as: Norco Take 1-2 tablets by mouth every 6 (six) hours as needed for moderate pain.   isosorbide-hydrALAZINE 20-37.5 MG tablet Commonly known as: BIDIL Take 1 tablet by mouth 3 (three) times daily.   metFORMIN 500 MG 24 hr tablet Commonly known as: GLUCOPHAGE-XR TAKE 1 TABLET BY MOUTH TWICE A DAY   olmesartan-hydrochlorothiazide 40-12.5 MG tablet Commonly known as: BENICAR HCT TAKE 1 TABLET BY MOUTH EVERY DAY   sulfamethoxazole-trimethoprim 800-160 MG tablet Commonly known as: BACTRIM DS Take 1 tablet by mouth 2 (two) times daily. Start the day prior to foley removal appointment   Trulicity 1.5 EX/9.3ZJ Sopn Generic drug:  Dulaglutide INJECT 1.5 MG INTO THE SKIN ONCE A WEEK.       Allergies: No Known Allergies  Family History: Family History  Problem Relation Age of Onset  . Hypertension Father        DOB [birth year]  . Hyperlipidemia Father   . Diabetes Father   . Coronary artery disease Father        with stents  . Nephrolithiasis Father   . Kidney disease Father        s/p rejected kidney transplant; on HD  . Memory loss Mother        DOB 42  . Kidney disease Mother        s/p nephrectomy, infection problem  . Dementia  Mother   . Cancer Brother        lung  . HIV Brother   . Prostate cancer Neg Hx   . Colon cancer Neg Hx     Social History:  reports that he is a non-smoker but has been exposed to tobacco smoke. He has never used smokeless tobacco. He reports that he does not drink alcohol and does not use drugs.  ROS: All other review of systems were reviewed and are negative except what is noted above in HPI  Physical Exam: BP (!) 157/79   Pulse 80   Temp 99.3 F (37.4 C)   Ht 6\' 5"  (1.956 m)   Wt 300 lb (136.1 kg)   BMI 35.57 kg/m   Constitutional:  Alert and oriented, No acute distress. HEENT:  AT, moist mucus membranes.  Trachea midline, no masses. Cardiovascular: No clubbing, cyanosis, or edema. Respiratory: Normal respiratory effort, no increased work of breathing. GI: Abdomen is soft, nontender, nondistended, no abdominal masses GU: No CVA tenderness.  Lymph: No cervical or inguinal lymphadenopathy. Skin: No rashes, bruises or suspicious lesions. Neurologic: Grossly intact, no focal deficits, moving all 4 extremities. Psychiatric: Normal mood and affect.  Laboratory Data: Lab Results  Component Value Date   WBC 4.8 05/23/2020   HGB 11.3 (L) 06/03/2020   HCT 35.3 (L) 06/03/2020   MCV 88.4 05/23/2020   PLT 243 05/23/2020    Lab Results  Component Value Date   CREATININE 1.88 (H) 06/01/2020    Lab Results  Component Value Date   PSA 11.14 (H) 11/19/2012   PSA 7.47 (H) 11/04/2011   PSA 3.43 04/23/2009    No results found for: TESTOSTERONE  Lab Results  Component Value Date   HGBA1C 7.6 (A) 06/08/2020    Urinalysis    Component Value Date/Time   COLORURINE YELLOW 03/28/2020 1805   APPEARANCEUR HAZY (A) 03/28/2020 1805   LABSPEC 1.016 03/28/2020 1805   PHURINE 5.0 03/28/2020 1805   GLUCOSEU NEGATIVE 03/28/2020 1805   GLUCOSEU >=1000 04/23/2009 0911   HGBUR LARGE (A) 03/28/2020 1805   BILIRUBINUR NEGATIVE 03/28/2020 1805   BILIRUBINUR moderate 11/04/2019  1419   KETONESUR NEGATIVE 03/28/2020 1805   PROTEINUR 100 (A) 03/28/2020 1805   UROBILINOGEN negative (A) 11/04/2019 1419   UROBILINOGEN 0.2 04/23/2009 0911   NITRITE NEGATIVE 03/28/2020 1805   LEUKOCYTESUR NEGATIVE 03/28/2020 1805    Lab Results  Component Value Date   BACTERIA NONE SEEN 03/28/2020    Pertinent Imaging: Cystogram today: Images reviewed and discussed with the patient Results for orders placed during the hospital encounter of 09/20/19  DG Abdomen 1 View  Narrative CLINICAL DATA:  Constipation and trouble breathing  EXAM: ABDOMEN - 1 VIEW  COMPARISON:  None.  FINDINGS: The bowel  gas pattern is normal. No abnormal stool retention. Pelvic calcifications on the right have the appearance of phlebolith. No abnormal osseous findings.  IMPRESSION: Normal bowel gas pattern.  No abnormal stool retention.   Electronically Signed By: Monte Fantasia M.D. On: 09/20/2019 05:09  No results found for this or any previous visit.  No results found for this or any previous visit.  No results found for this or any previous visit.  No results found for this or any previous visit.  No results found for this or any previous visit.  No results found for this or any previous visit.  No results found for this or any previous visit.   Assessment & Plan:    1. BPH with urinary obstruction -Voiding trial passed today. RTC 1 month with PVR - Urinalysis, Routine w reflex microscopic   No follow-ups on file.  Nicolette Bang, MD  [facility] Urology [place]

## 2020-06-13 NOTE — Patient Instructions (Signed)

## 2020-06-15 ENCOUNTER — Other Ambulatory Visit: Payer: Self-pay

## 2020-06-15 ENCOUNTER — Ambulatory Visit: Payer: BC Managed Care – PPO

## 2020-06-15 ENCOUNTER — Ambulatory Visit (INDEPENDENT_AMBULATORY_CARE_PROVIDER_SITE_OTHER): Payer: BC Managed Care – PPO

## 2020-06-15 DIAGNOSIS — R339 Retention of urine, unspecified: Secondary | ICD-10-CM

## 2020-06-15 NOTE — Progress Notes (Signed)
Bladder Scan Patient can void: 0 ml Performed By: Cleone Hulick, lpn  

## 2020-06-19 NOTE — Progress Notes (Signed)
Return to work note printed and given to patient.

## 2020-06-21 ENCOUNTER — Other Ambulatory Visit: Payer: Self-pay

## 2020-06-21 MED ORDER — ATORVASTATIN CALCIUM 80 MG PO TABS
80.0000 mg | ORAL_TABLET | Freq: Every day | ORAL | 11 refills | Status: DC
Start: 2020-06-21 — End: 2021-06-28

## 2020-06-21 MED ORDER — CARVEDILOL 25 MG PO TABS: 25.0000 mg | ORAL_TABLET | Freq: Two times a day (BID) | ORAL | 3 refills | Status: DC

## 2020-06-25 ENCOUNTER — Other Ambulatory Visit: Payer: Self-pay | Admitting: Cardiology

## 2020-06-25 MED ORDER — ISOSORB DINITRATE-HYDRALAZINE 20-37.5 MG PO TABS: 1.0000 | ORAL_TABLET | Freq: Three times a day (TID) | ORAL | 3 refills | Status: DC

## 2020-07-12 ENCOUNTER — Other Ambulatory Visit: Payer: Self-pay | Admitting: Cardiology

## 2020-07-13 ENCOUNTER — Ambulatory Visit (INDEPENDENT_AMBULATORY_CARE_PROVIDER_SITE_OTHER): Payer: BC Managed Care – PPO | Admitting: Urology

## 2020-07-13 ENCOUNTER — Encounter: Payer: Self-pay | Admitting: Urology

## 2020-07-13 VITALS — BP 174/89 | HR 78 | Temp 98.6°F | Ht 77.0 in | Wt 295.0 lb

## 2020-07-13 DIAGNOSIS — N138 Other obstructive and reflux uropathy: Secondary | ICD-10-CM | POA: Diagnosis not present

## 2020-07-13 DIAGNOSIS — N401 Enlarged prostate with lower urinary tract symptoms: Secondary | ICD-10-CM | POA: Diagnosis not present

## 2020-07-13 LAB — URINALYSIS, ROUTINE W REFLEX MICROSCOPIC
Bilirubin, UA: NEGATIVE
Glucose, UA: NEGATIVE
Ketones, UA: NEGATIVE
Nitrite, UA: NEGATIVE
Specific Gravity, UA: 1.02 (ref 1.005–1.030)
Urobilinogen, Ur: 0.2 mg/dL (ref 0.2–1.0)
pH, UA: 6 (ref 5.0–7.5)

## 2020-07-13 LAB — MICROSCOPIC EXAMINATION: Renal Epithel, UA: NONE SEEN /hpf

## 2020-07-13 LAB — BLADDER SCAN AMB NON-IMAGING: Scan Result: 19

## 2020-07-13 MED ORDER — FINASTERIDE 5 MG PO TABS
5.0000 mg | ORAL_TABLET | Freq: Every day | ORAL | 3 refills | Status: DC
Start: 2020-07-13 — End: 2021-05-27

## 2020-07-13 NOTE — Progress Notes (Signed)
Bladder Scan Patient can void: 19 ml Performed By: Durenda Guthrie, lpn   Urological Symptom Review  Patient is experiencing the following symptoms: none   Review of Systems  Gastrointestinal (upper)  : Negative for upper GI symptoms  Gastrointestinal (lower) : Negative for lower GI symptoms  Constitutional : Negative for symptoms  Skin: Negative for skin symptoms  Eyes: Negative for eye symptoms  Ear/Nose/Throat : Negative for Ear/Nose/Throat symptoms  Hematologic/Lymphatic: Negative for Hematologic/Lymphatic symptoms  Cardiovascular : Negative for cardiovascular symptoms  Respiratory : Negative for respiratory symptoms  Endocrine: Negative for endocrine symptoms  Musculoskeletal: Negative for musculoskeletal symptoms  Neurological: Negative for neurological symptoms  Psychologic: Negative for psychiatric symptoms

## 2020-07-13 NOTE — Patient Instructions (Signed)

## 2020-07-13 NOTE — Progress Notes (Signed)
07/13/2020 1:49 PM   Johnny [name] [birth year] [id number]  Referring provider: [name], MD [address] [place],  [place] [address]  followup simple prostatectomy  HPI: Mr Johnny [name] is a 65yo her for followup for BPH. He is urination well since the foley was removed. He has intermittent gross hematuria. He is currently on eliquis. He denies dysuria. NO other significant LUTS.    PMH: Past Medical History:  Diagnosis Date  . CKD (chronic kidney disease) stage 3, GFR 30-59 ml/min ([place]) 06/10/2017  . Constipation 03/27/2020  . Diabetes mellitus   . Dysrhythmia    A flutter  . Essential hypertension 08/03/2007   Qualifier: Diagnosis of  By: Johnny Hedges MD, Johnny [name]  Med ARB, diuretic    . Guttate psoriasis   . Hyperlipidemia associated with type 2 diabetes mellitus ([place]) 08/03/2007   Qualifier: Diagnosis of  By: Johnny Hedges MD, Johnny [name]  meds - none   . Paroxysmal atrial flutter ([place])    S/P TEE/DCCV 07/2019  . 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 Sleep apnea    No CPAP  . Type II diabetes mellitus with renal manifestations, uncontrolled ([place]) 08/03/2007   Qualifier: Diagnosis of  By: Johnny Hedges MD, Johnny [name]  Med metformin     Surgical History: Past Surgical History:  Procedure Laterality Date  . CARDIOVERSION N/A 07/18/2019   Procedure: CARDIOVERSION;  Surgeon: Johnny Spark, MD;  Location: [facility] ENDOSCOPY;  Service: Cardiovascular;  Laterality: N/A;  . TEE WITHOUT CARDIOVERSION N/A 07/18/2019   Procedure: TRANSESOPHAGEAL ECHOCARDIOGRAM (TEE);  Surgeon: Johnny Spark, MD;  Location: [facility] ENDOSCOPY;  Service: Cardiovascular;  Laterality: N/A;  . TOOTH EXTRACTION    . XI ROBOTIC ASSISTED SIMPLE PROSTATECTOMY N/A 05/31/2020   Procedure: XI ROBOTIC ASSISTED SIMPLE PROSTATECTOMY-SUPRAPUBIC;  Surgeon: Johnny Gustin, MD;  Location: [facility] ORS;  Service: Urology;  Laterality: N/A;    Home Medications:  Allergies as of  07/13/2020   No Known Allergies     Medication List       Accurate as of July 13, 2020  1:49 PM. If you have any questions, ask your nurse or doctor.        STOP taking these medications   finasteride 5 MG tablet Commonly known as: PROSCAR   sulfamethoxazole-trimethoprim 800-160 MG tablet Commonly known as: BACTRIM DS     TAKE these medications   ammonium lactate 12 % lotion Commonly known as: AmLactin Apply 1 application topically as needed for dry skin.   atorvastatin 80 MG tablet Commonly known as: LIPITOR Take 1 tablet (80 mg total) by mouth daily.   carvedilol 25 MG tablet Commonly known as: COREG Take 1 tablet (25 mg total) by mouth 2 (two) times daily with a meal.   glipiZIDE 5 MG tablet Commonly known as: GLUCOTROL Take 1 tablet (5 mg total) by mouth 2 (two) times daily before a meal.   HYDROcodone-acetaminophen 5-325 MG tablet Commonly known as: Norco Take 1-2 tablets by mouth every 6 (six) hours as needed for moderate pain.   isosorbide-hydrALAZINE 20-37.5 MG tablet Commonly known as: BIDIL Take 1 tablet by mouth 3 (three) times daily.   metFORMIN 500 MG 24 hr tablet Commonly known as: GLUCOPHAGE-XR TAKE 1 TABLET BY MOUTH TWICE A DAY   olmesartan-hydrochlorothiazide 40-12.5 MG tablet Commonly known as: BENICAR HCT TAKE 1 TABLET BY MOUTH EVERY DAY   Trulicity 1.5 FF/6.3WG Sopn Generic drug: Dulaglutide INJECT 1.5 MG  INTO THE SKIN ONCE A WEEK.       Allergies: No Known Allergies  Family History: Family History  Problem Relation Age of Onset  . Hypertension Father        DOB [birth year]  . Hyperlipidemia Father   . Diabetes Father   . Coronary artery disease Father        with stents  . Nephrolithiasis Father   . Kidney disease Father        s/p rejected kidney transplant; on HD  . Memory loss Mother        DOB 50  . Kidney disease Mother        s/p nephrectomy, infection problem  . Dementia Mother   . Cancer Brother        lung  .  HIV Brother   . Prostate cancer Neg Hx   . Colon cancer Neg Hx     Social History:  reports that he is a non-smoker but has been exposed to tobacco smoke. He has never used smokeless tobacco. He reports that he does not drink alcohol and does not use drugs.  ROS: All other review of systems were reviewed and are negative except what is noted above in HPI  Physical Exam: BP (!) 174/89   Pulse 78   Temp 98.6 F (37 C)   Ht 6\' 5"  (1.956 m)   Wt 295 lb (133.8 kg)   BMI 34.98 kg/m   Constitutional:  Alert and oriented, No acute distress. HEENT: [place] AT, moist mucus membranes.  Trachea midline, no masses. Cardiovascular: No clubbing, cyanosis, or edema. Respiratory: Normal respiratory effort, no increased work of breathing. GI: Abdomen is soft, nontender, nondistended, no abdominal masses GU: No CVA tenderness.  Lymph: No cervical or inguinal lymphadenopathy. Skin: No rashes, bruises or suspicious lesions. Neurologic: Grossly intact, no focal deficits, moving all 4 extremities. Psychiatric: Normal mood and affect.  Laboratory Data: Lab Results  Component Value Date   WBC 4.8 05/23/2020   HGB 11.3 (L) 06/03/2020   HCT 35.3 (L) 06/03/2020   MCV 88.4 05/23/2020   PLT 243 05/23/2020    Lab Results  Component Value Date   CREATININE 1.88 (H) 06/01/2020    Lab Results  Component Value Date   PSA 11.14 (H) 11/19/2012   PSA 7.47 (H) 11/04/2011   PSA 3.43 04/23/2009    No results found for: TESTOSTERONE  Lab Results  Component Value Date   HGBA1C 7.6 (A) 06/08/2020    Urinalysis    Component Value Date/Time   COLORURINE YELLOW 03/28/2020 1805   APPEARANCEUR HAZY (A) 03/28/2020 1805   LABSPEC 1.016 03/28/2020 1805   PHURINE 5.0 03/28/2020 1805   GLUCOSEU NEGATIVE 03/28/2020 1805   GLUCOSEU >=1000 04/23/2009 0911   HGBUR LARGE (A) 03/28/2020 1805   BILIRUBINUR NEGATIVE 03/28/2020 1805   BILIRUBINUR moderate 11/04/2019 1419   KETONESUR NEGATIVE 03/28/2020 1805    PROTEINUR 100 (A) 03/28/2020 1805   UROBILINOGEN negative (A) 11/04/2019 1419   UROBILINOGEN 0.2 04/23/2009 0911   NITRITE NEGATIVE 03/28/2020 1805   LEUKOCYTESUR NEGATIVE 03/28/2020 1805    Lab Results  Component Value Date   BACTERIA NONE SEEN 03/28/2020    Pertinent Imaging:  Results for orders placed during the hospital encounter of 09/20/19  DG Abdomen 1 View  Narrative CLINICAL DATA:  Constipation and trouble breathing  EXAM: ABDOMEN - 1 VIEW  COMPARISON:  None.  FINDINGS: The bowel gas pattern is normal. No abnormal stool retention. Pelvic calcifications on the  right have the appearance of phlebolith. No abnormal osseous findings.  IMPRESSION: Normal bowel gas pattern.  No abnormal stool retention.   Electronically Signed By: Monte Fantasia M.D. On: 09/20/2019 05:09  No results found for this or any previous visit.  No results found for this or any previous visit.  No results found for this or any previous visit.  No results found for this or any previous visit.  No results found for this or any previous visit.  No results found for this or any previous visit.  No results found for this or any previous visit.   Assessment & Plan:    1. BPH with urinary obstruction -RTC 6 months with PVR -finasteride 5mg  daily - BLADDER SCAN AMB NON-IMAGING - Urinalysis, Routine w reflex microscopic   No follow-ups on file.  Nicolette Bang, MD  [facility] [place]

## 2020-07-16 ENCOUNTER — Other Ambulatory Visit: Payer: Self-pay | Admitting: Cardiology

## 2020-07-25 ENCOUNTER — Other Ambulatory Visit: Payer: Self-pay | Admitting: Cardiology

## 2020-07-31 ENCOUNTER — Telehealth: Payer: Self-pay

## 2020-07-31 MED ORDER — APIXABAN 5 MG PO TABS: 5.0000 mg | ORAL_TABLET | Freq: Two times a day (BID) | ORAL | 5 refills | Status: DC

## 2020-07-31 NOTE — Telephone Encounter (Signed)
Pt last saw Dr Radford Pax 05/11/20, last labs 06/01/20 Creat 1.88, age 65, weight 133.8kg, based on specified criteria pt is on appropriate dosage of Eliquis 5mg  BID.  Will refill rx.

## 2020-07-31 NOTE — Telephone Encounter (Signed)
This medication was D/C off pt's medication list. Can you please reorder pt's Eliquis? Please address

## 2020-07-31 NOTE — Telephone Encounter (Signed)
[facility] is requesting a refill on Eliquis. This medication was D/C in the hospital for a surgery. Does pt supposed to start back taking this medication? Please address

## 2020-07-31 NOTE — Telephone Encounter (Signed)
Patient was supposed to resume Eliquis when cleared by urology. Per last visit with urology 3/11 patient currently on Eliquis. Okay to refill.

## 2020-08-09 ENCOUNTER — Ambulatory Visit: Payer: BC Managed Care – PPO | Admitting: Cardiology

## 2020-08-09 ENCOUNTER — Other Ambulatory Visit: Payer: Self-pay

## 2020-08-09 ENCOUNTER — Encounter: Payer: Self-pay | Admitting: Cardiology

## 2020-08-09 VITALS — BP 164/96 | HR 82 | Ht 77.0 in | Wt 314.8 lb

## 2020-08-09 DIAGNOSIS — I10 Essential (primary) hypertension: Secondary | ICD-10-CM | POA: Diagnosis not present

## 2020-08-09 DIAGNOSIS — G4733 Obstructive sleep apnea (adult) (pediatric): Secondary | ICD-10-CM | POA: Diagnosis not present

## 2020-08-09 DIAGNOSIS — R0981 Nasal congestion: Secondary | ICD-10-CM

## 2020-08-09 DIAGNOSIS — R319 Hematuria, unspecified: Secondary | ICD-10-CM | POA: Diagnosis not present

## 2020-08-09 MED ORDER — FLUTICASONE PROPIONATE 50 MCG/ACT NA SUSP: 1.0000 | Freq: Every day | NASAL | 2 refills | Status: DC

## 2020-08-09 MED ORDER — LORATADINE 10 MG PO CAPS: 10.0000 mg | ORAL_CAPSULE | Freq: Every day | ORAL | 3 refills | Status: DC

## 2020-08-09 NOTE — Progress Notes (Signed)
Cardiology Office Note    Date:  08/09/2020   ID:  Johnny [name], DOB [birth year], MRN [id number]  PCP:  [name], MD  Cardiologist:  Fransico Him, MD   Chief Complaint  Patient presents with  . Sleep Apnea  . Hypertension    History of Present Illness:  Johnny [name] is a 65 y.o. 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  He also has a hx of medical noncompliance.  His last 2D echo 08/2019 showed normal LVF with mild LVH and moderate biatrial enlargement.   He underwent sleep study due to atrial flutter which showed severe OSA with an AHI of 92/hr and nocturnal hypoxemia with O2 sats as low as 85%.  He underwent CPAP titration but due to ongoing events he was transitioned to auto BiPAP but had not received his device when I saw him a few months ago.    He is now here for followup with his new device.  He says that he is having a lot of problems with sinus issues since going on the device.  He says that he is getting a lot of nasal congestion since he started usint the device.  He uses a full face mask and also has 3 other masks that he has tried but the full face mask works better because he is a [employer] copy.  He has not tried and nasal saline spray.  He is waking up with headaches as well.  He has not tried taking any OTC allergy medicine.      Past Medical History:  Diagnosis Date  . CKD (chronic kidney disease) stage 3, GFR 30-59 ml/min ([place]) 06/10/2017  . Constipation 03/27/2020  . Diabetes mellitus   . Dysrhythmia    A flutter  . Essential hypertension 08/03/2007   Qualifier: Diagnosis of  By: Linda Hedges MD, Heinz Knuckles  Med ARB, diuretic    . Guttate psoriasis   . Hyperlipidemia associated with type 2 diabetes mellitus ([place]) 08/03/2007   Qualifier: Diagnosis of  By: Linda Hedges MD, Heinz Knuckles  meds - none   . Paroxysmal atrial flutter  ([place])    S/P TEE/DCCV 07/2019  . 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 Sleep apnea   . Type II diabetes mellitus with renal manifestations, uncontrolled ([place]) 08/03/2007   Qualifier: Diagnosis of  By: Linda Hedges MD, Heinz Knuckles  Med metformin     Past Surgical History:  Procedure Laterality Date  . CARDIOVERSION N/A 07/18/2019   Procedure: CARDIOVERSION;  Surgeon: Dorothy Spark, MD;  Location: [facility] ENDOSCOPY;  Service: Cardiovascular;  Laterality: N/A;  . TEE WITHOUT CARDIOVERSION N/A 07/18/2019   Procedure: TRANSESOPHAGEAL ECHOCARDIOGRAM (TEE);  Surgeon: Dorothy Spark, MD;  [facility] ENDOSCOPY;  Service: Cardiovascular;  Laterality: N/A;  . TOOTH EXTRACTION    . XI ROBOTIC ASSISTED SIMPLE PROSTATECTOMY N/A 05/31/2020   Procedure: XI ROBOTIC ASSISTED SIMPLE PROSTATECTOMY-SUPRAPUBIC;  Surgeon: Cleon Gustin, MD;  Location: [facility] ORS;  Service: Urology;  Laterality: N/A;    Current Medications: Current Meds  Medication Sig  . ammonium lactate (AMLACTIN) 12 % lotion Apply 1 application topically as needed for dry skin.  Marland Kitchen apixaban (ELIQUIS) 5 MG TABS tablet Take 1 tablet (5 mg total) by mouth 2 (two) times daily.  Marland Kitchen  atorvastatin (LIPITOR) 80 MG tablet Take 1 tablet (80 mg total) by mouth daily.  . carvedilol (COREG) 25 MG tablet Take 1 tablet (25 mg total) by mouth 2 (two) times daily with a meal.  . finasteride (PROSCAR) 5 MG tablet Take 1 tablet (5 mg total) by mouth daily.  Marland Kitchen glipiZIDE (GLUCOTROL) 5 MG tablet Take 1 tablet (5 mg total) by mouth 2 (two) times daily before a meal.  . isosorbide-hydrALAZINE (BIDIL) 20-37.5 MG tablet Take 1 tablet by mouth 3 (three) times daily.  . metFORMIN (GLUCOPHAGE-XR) 500 MG 24 hr tablet TAKE 1 TABLET BY MOUTH TWICE A DAY  . olmesartan-hydrochlorothiazide (BENICAR HCT) 40-12.5 MG tablet TAKE 1 TABLET BY MOUTH EVERY DAY  . TRULICITY 1.5 GQ/6.7YP SOPN INJECT 1.5 MG INTO THE SKIN ONCE A WEEK.     Allergies:   Patient has no known allergies.   Social History   Socioeconomic History  . Marital status: Married    Spouse name: Not on file  . Number of children: 2  . Years of education: 80  . Highest education level: Not on file  Occupational History  . Occupation: mfg  Tobacco Use  . Smoking status: Passive Smoke Exposure - Never Smoker  . Smokeless tobacco: Never Used  Vaping Use  . Vaping Use: Never used  Substance and Sexual Activity  . Alcohol use: No  . Drug use: No  . Sexual activity: Yes  Other Topics Concern  . Not on file  Social History Narrative   [employer]. [employer]. [employer] x 4 years. Married '94. 2 sons - '95, '96. Wife is healthy. Work - Banker.   Social Determinants of Radio broadcast assistant Strain: Not on file  Food Insecurity: Not on file  Transportation Needs: Not on file  Physical Activity: Not on file  Stress: Not on file  Social Connections: Not on file     Family History:  The patient's family history includes Cancer in his brother; Coronary artery disease in his father; Dementia in his mother; Diabetes in his father; HIV in his brother; Hyperlipidemia in his father; Hypertension in his father; Kidney disease in his father and mother; Memory loss in his mother; Nephrolithiasis in his father.   ROS:   Please see the history of present illness.    ROS All other systems reviewed and are negative.  No flowsheet data found.  PHYSICAL EXAM:   VS:  BP (!) 164/96   Pulse 82   Ht 6\' 5"  (1.956 m)   Wt (!) 314 lb 12.8 oz (142.8 kg)   SpO2 95%   BMI 37.33 kg/m     GEN: Well nourished, well developed in no acute distress HEENT: Normal NECK: No JVD; No carotid bruits LYMPHATICS: No lymphadenopathy CARDIAC:RRR, no murmurs, rubs, gallops RESPIRATORY:  Clear to auscultation without rales, wheezing or rhonchi  ABDOMEN: Soft, non-tender, non-distended MUSCULOSKELETAL:  No edema; No deformity  SKIN: Warm and dry NEUROLOGIC:  Alert and  oriented x 3 PSYCHIATRIC:  Normal affect    Wt Readings from Last 3 Encounters:  08/09/20 (!) 314 lb 12.8 oz (142.8 kg)  07/13/20 295 lb (133.8 kg)  06/13/20 300 lb (136.1 kg)      Studies/Labs Reviewed:   EKG:  EKG is not ordered today.  2D echo 08/2019 IMPRESSIONS   1. Left ventricular ejection fraction, by estimation, is 60 to 65%. The  left ventricle has normal function. The left ventricle has no regional  wall motion abnormalities. There is mild  concentric left ventricular  hypertrophy. Left ventricular diastolic  parameters are indeterminate.  2. Right ventricular systolic function is normal. The right ventricular  size is mildly enlarged. There is normal pulmonary artery systolic  pressure.  3. Left atrial size was moderately dilated.  4. Right atrial size was moderately dilated.  5. The mitral valve is normal in structure. No evidence of mitral valve  regurgitation. No evidence of mitral stenosis.  6. The aortic valve is normal in structure. Aortic valve regurgitation is  not visualized. No aortic stenosis is present.  7. Aortic dilatation noted. There is mild dilatation at the level of the  sinuses of Valsalva measuring 40 mm.  8. The inferior vena cava is normal in size with greater than 50%  respiratory variability, suggesting right atrial pressure of 3 mmHg.   Recent Labs: 03/28/2020: ALT 57; Magnesium 2.0 05/23/2020: Platelets 243 06/01/2020: BUN 29; Creatinine, Ser 1.88; Potassium 4.6; Sodium 136 06/03/2020: Hemoglobin 11.3   Lipid Panel    Component Value Date/Time   CHOL 123 09/05/2019 1101   TRIG 63 09/05/2019 1101   TRIG 63 05/01/2006 1329   HDL 31 (L) 09/05/2019 1101   CHOLHDL 4.0 09/05/2019 1101   CHOLHDL 6.3 07/15/2019 0408   VLDL 13 07/15/2019 0408   LDLCALC 79 09/05/2019 1101   LDLDIRECT 170.2 11/19/2012 1105    Additional studies/ records that were reviewed today include:  Labs and PAP compliance download    ASSESSMENT:    1.  OSA (obstructive sleep apnea)   2. Essential hypertension   3. Hematuria, unspecified type      PLAN:  In order of problems listed above:  1.  OSA  -he has not been using his device because he has been getting a lot of nasal congestion to the point he cannot use his device -I have recommended that he try OTC Claritin daily as well as nasal saline spray 2 sprays each nostril twice daily -I will refer him to ENT to make sure there is no obstruction in his nasal passages  2.  HTN -BP is elevated today but has white coat HTN and at home his BP runs 140/66mmHg -continue Carvedilol 25mg  BID and Benicar HCT 40-12.5mg  daily -increase Bidil to 20-37.5mg  2 tablets TID -check BP daily around dinner daily for a week and call with results -SCr 1.88 and K+ 4.6 in Jan 2022  3.  Hematuria -he had had blood in his urine and stopped his eliquis for a a few weeks after having prostate surgery and the bleeding stopped but then restarted his Eliquis and now is having hematuria again -I advised him to call his Uroloigst today  Medication Adjustments/Labs and Tests Ordered: Current medicines are reviewed at length with the patient today.  Concerns regarding medicines are outlined above.  Medication changes, Labs and Tests ordered today are listed in the Patient Instructions below.  There are no Patient Instructions on file for this visit.   Signed, Fransico Him, MD  08/09/2020 11:02 AM    [facility] [place], [place], [place]  [address] Phone: [phone]; Fax: [phone]

## 2020-08-09 NOTE — Patient Instructions (Addendum)
Medication Instructions:  Your physician has recommended you make the following change in your medication:   1. Fluticasone (Flonase) nasal spray. One spray each nostril, once daily.   2.  Loratadine (Claritin) 10 mg tablet, once daily.   3. Nasal saline spray, 2 sprays each nostril twice daily. Use this opposite time of the day from your fluticasone nasal spray.  Labwork: None ordered.  Testing/Procedures: None ordered.  Follow-Up: Your physician recommends that you schedule a follow-up appointment in:   ENT referral to Dr. Pollie Friar office  July 8 at 1pm with Dr. Radford Pax   Any Other Special Instructions Will Be Listed Below (If Applicable).     If you need a refill on your cardiac medications before your next appointment, please call your pharmacy.

## 2020-08-09 NOTE — Addendum Note (Signed)
Addended by: Dollene Primrose on: 08/09/2020 11:17 AM   Modules accepted: Orders

## 2020-08-22 ENCOUNTER — Other Ambulatory Visit: Payer: Self-pay | Admitting: Internal Medicine

## 2020-08-24 ENCOUNTER — Other Ambulatory Visit: Payer: Self-pay

## 2020-08-24 ENCOUNTER — Ambulatory Visit (INDEPENDENT_AMBULATORY_CARE_PROVIDER_SITE_OTHER): Payer: BC Managed Care – PPO | Admitting: Otolaryngology

## 2020-08-24 VITALS — Temp 95.2°F

## 2020-08-24 DIAGNOSIS — J310 Chronic rhinitis: Secondary | ICD-10-CM

## 2020-08-24 DIAGNOSIS — G4733 Obstructive sleep apnea (adult) (pediatric): Secondary | ICD-10-CM | POA: Diagnosis not present

## 2020-08-24 NOTE — Progress Notes (Signed)
HPI: Johnny [name] is a 65 y.o. male who presents is referred by Fransico Him, MD for evaluation of nasal obstruction and recent diagnosis of obstructive sleep apnea.  When he first started using the full mask nasal CPAP machine he developed bad nasal obstruction and difficulty breathing through his nose and using the machine.  But more recently he was prescribed Flonase 2 sprays daily as well as saline rinses and has done much better since using the Flonase and saline rinses.  Past Medical History:  Diagnosis Date  . CKD (chronic kidney disease) stage 3, GFR 30-59 ml/min ([place]) 06/10/2017  . Constipation 03/27/2020  . Diabetes mellitus   . Dysrhythmia    A flutter  . Essential hypertension 08/03/2007   Qualifier: Diagnosis of  By: Linda Hedges MD, Heinz Knuckles  Med ARB, diuretic    . Guttate psoriasis   . Hyperlipidemia associated with type 2 diabetes mellitus ([place]) 08/03/2007   Qualifier: Diagnosis of  By: Linda Hedges MD, Heinz Knuckles  meds - none   . Paroxysmal atrial flutter ([place])    S/P TEE/DCCV 07/2019  . 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 Sleep apnea   . Type II diabetes mellitus with renal manifestations, uncontrolled ([place]) 08/03/2007   Qualifier: Diagnosis of  By: Linda Hedges MD, Heinz Knuckles  Med metformin    Past Surgical History:  Procedure Laterality Date  . CARDIOVERSION N/A 07/18/2019   Procedure: CARDIOVERSION;  Surgeon: Dorothy Spark, MD;  Location: [facility] ENDOSCOPY;  Service: Cardiovascular;  Laterality: N/A;  . TEE WITHOUT CARDIOVERSION N/A 07/18/2019   Procedure: TRANSESOPHAGEAL ECHOCARDIOGRAM (TEE);  Surgeon: Dorothy Spark, MD;  Location: [facility] ENDOSCOPY;  Service: Cardiovascular;  Laterality: N/A;  . TOOTH EXTRACTION    . XI ROBOTIC ASSISTED SIMPLE PROSTATECTOMY N/A 05/31/2020   Procedure: XI ROBOTIC ASSISTED SIMPLE PROSTATECTOMY-SUPRAPUBIC;  Surgeon: Cleon Gustin, MD;  Location: [facility] ORS;  Service: Urology;  Laterality: N/A;   Social  History   Socioeconomic History  . Marital status: Married    Spouse name: Not on file  . Number of children: 2  . Years of education: 70  . Highest education level: Not on file  Occupational History  . Occupation: mfg  Tobacco Use  . Smoking status: Passive Smoke Exposure - Never Smoker  . Smokeless tobacco: Never Used  Vaping Use  . Vaping Use: Never used  Substance and Sexual Activity  . Alcohol use: No  . Drug use: No  . Sexual activity: Yes  Other Topics Concern  . Not on file  Social History Narrative   [employer]. [employer]. [employer] x 4 years. Married '94. 2 sons - '95, '96. Wife is healthy. Work - Banker.   Social Determinants of Radio broadcast assistant Strain: Not on file  Food Insecurity: Not on file  Transportation Needs: Not on file  Physical Activity: Not on file  Stress: Not on file  Social Connections: Not on file   Family History  Problem Relation Age of Onset  . Hypertension Father        DOB [birth year]  . Hyperlipidemia Father   . Diabetes Father   . Coronary artery disease Father        with stents  . Nephrolithiasis Father   . Kidney disease Father        s/p rejected kidney transplant; on HD  . Memory loss Mother        DOB 16  .  Kidney disease Mother        s/p nephrectomy, infection problem  . Dementia Mother   . Cancer Brother        lung  . HIV Brother   . Prostate cancer Neg Hx   . Colon cancer Neg Hx    No Known Allergies Prior to Admission medications   Medication Sig Start Date End Date Taking? Authorizing Provider  ammonium lactate (AMLACTIN) 12 % lotion Apply 1 application topically as needed for dry skin. 03/02/20   Felipa Furnace, DPM  apixaban (ELIQUIS) 5 MG TABS tablet Take 1 tablet (5 mg total) by mouth 2 (two) times daily. 07/31/20   Sueanne Margarita, MD  atorvastatin (LIPITOR) 80 MG tablet Take 1 tablet (80 mg total) by mouth daily. 06/21/20 06/21/21  Sueanne Margarita, MD  carvedilol (COREG) 25 MG tablet Take 1 tablet (25 mg  total) by mouth 2 (two) times daily with a meal. 06/21/20 06/21/21  Turner, Eber Hong, MD  finasteride (PROSCAR) 5 MG tablet Take 1 tablet (5 mg total) by mouth daily. 07/13/20   McKenzie, Candee Furbish, MD  fluticasone (FLONASE) 50 MCG/ACT nasal spray Place 1 spray into both nostrils daily. 08/09/20   Sueanne Margarita, MD  glipiZIDE (GLUCOTROL) 5 MG tablet Take 1 tablet (5 mg total) by mouth 2 (two) times daily before a meal. 02/03/20   Shamleffer, Melanie Crazier, MD  isosorbide-hydrALAZINE (BIDIL) 20-37.5 MG tablet Take 1 tablet by mouth 3 (three) times daily. 06/25/20 06/25/21  Sueanne Margarita, MD  Loratadine 10 MG CAPS Take 1 capsule (10 mg total) by mouth daily. 08/09/20   Sueanne Margarita, MD  metFORMIN (GLUCOPHAGE-XR) 500 MG 24 hr tablet TAKE 1 TABLET BY MOUTH TWICE A DAY 12/29/19   Shamleffer, Melanie Crazier, MD  olmesartan-hydrochlorothiazide (BENICAR HCT) 40-12.5 MG tablet TAKE 1 TABLET BY MOUTH EVERY DAY 04/16/20   [name], MD  TRULICITY 1.5 EQ/6.8TM SOPN INJECT 1.5 MG INTO THE SKIN ONCE A WEEK. 08/22/20   Shamleffer, Melanie Crazier, MD     Positive ROS: Otherwise negative  All other systems have been reviewed and were otherwise negative with the exception of those mentioned in the HPI and as above.  Physical Exam: Constitutional: Alert, well-appearing, no acute distress.  Moderately obese gentleman. Ears: External ears without lesions or tenderness. Ear canals are clear bilaterally with intact, clear TMs.  Nasal: External nose without lesions. Septum is mildly deviated.  Nasal passages otherwise clear.  There are no polyps.  Mild rhinitis.  No signs of infection..  Oral: Lips and gums without lesions. Tongue and palate mucosa without lesions. Posterior oropharynx clear.  Tonsils are small bilaterally. Neck: No palpable adenopathy or masses Respiratory: Breathing comfortably  Skin: No facial/neck lesions or rash noted.  Procedures  Assessment: Obstructive sleep apnea Rhinitis.   Patient is doing much better since using the Flonase and saline rinses. Septal deviation.  Plan: Patient is breathing much better today and tolerating the CPAP machine better since using this Flonase and saline rinses. Recommended use of the Flonase 2 sprays each nostril at night as well as saline rinses during the day.  If he develops chronic nasal obstruction surgery would be an option but presently he is doing much better with use of Flonase and saline rinses and would not recommend any surgical intervention.  No polyps or intranasal lesions noted.   Radene Journey, MD   CC:

## 2020-09-07 ENCOUNTER — Other Ambulatory Visit: Payer: Self-pay

## 2020-09-07 ENCOUNTER — Ambulatory Visit: Payer: BC Managed Care – PPO | Admitting: Podiatry

## 2020-09-07 DIAGNOSIS — M79675 Pain in left toe(s): Secondary | ICD-10-CM

## 2020-09-07 DIAGNOSIS — M79674 Pain in right toe(s): Secondary | ICD-10-CM | POA: Diagnosis not present

## 2020-09-07 DIAGNOSIS — E1142 Type 2 diabetes mellitus with diabetic polyneuropathy: Secondary | ICD-10-CM | POA: Diagnosis not present

## 2020-09-07 DIAGNOSIS — B351 Tinea unguium: Secondary | ICD-10-CM

## 2020-09-12 ENCOUNTER — Encounter: Payer: Self-pay | Admitting: Podiatry

## 2020-09-12 NOTE — Progress Notes (Signed)
  Subjective:  Patient ID: Johnny [name], male    DOB: 1956-04-13,  MRN: [id number]  Chief Complaint  Patient presents with  . Diabetes    Nail trim    65 y.o. male returns for the above complaint.  Patient presents with thickened elongated dystrophic toenails x10.  Patient is a painful to walk on.  Patient would like to have them debrided down as he is not able to do it himself.  Patient is a diabetic with last A1c of 7.6.  He does not have any secondary complaints.  Objective:  There were no vitals filed for this visit. Podiatric Exam: Vascular: dorsalis pedis and posterior tibial pulses are palpable bilateral. Capillary return is immediate. Temperature gradient is WNL. Skin turgor WNL  Sensorium: Normal Semmes Weinstein monofilament test. Normal tactile sensation bilaterally. Nail Exam: Pt has thick disfigured discolored nails with subungual debris noted bilateral entire nail hallux through fifth toenails.  Pain on palpation to the nails. Ulcer Exam: There is no evidence of ulcer or pre-ulcerative changes or infection. Orthopedic Exam: Muscle tone and strength are WNL. No limitations in general ROM. No crepitus or effusions noted. HAV  B/L.  Hammer toes 2-5  B/L. Skin: No Porokeratosis. No infection or ulcers.  Bilateral severe xerosis noted to bilateral lower extremity    Assessment & Plan:   1. Type 2 diabetes mellitus with diabetic polyneuropathy, without long-term current use of insulin (HCC)   2. Pain due to onychomycosis of toenails of both feet     Patient was evaluated and treated and all questions answered.  Xerosis skin severe bilateral lower extremity -I explained to the patient the etiology of xerosis and various treatment options were extensively discussed.  I explained to the patient the importance of maintaining moisturization of the skin with application of over-the-counter lotion such as Eucerin or Luciderm which she has failed.  I believe patient will benefit  from prescription lotion.  Ammonium lactate was sent to the pharmacy.  I have asked him to apply twice a day.  Patient states understanding   Onychomycosis with pain  -Nails palliatively debrided as below. -Educated on self-care  Procedure: Nail Debridement Rationale: pain  Type of Debridement: manual, sharp debridement. Instrumentation: Nail nipper, rotary burr. Number of Nails: 10  Procedures and Treatment: Consent by patient was obtained for treatment procedures. The patient understood the discussion of treatment and procedures well. All questions were answered thoroughly reviewed. Debridement of mycotic and hypertrophic toenails, 1 through 5 bilateral and clearing of subungual debris. No ulceration, no infection noted.  Return Visit-Office Procedure: Patient instructed to return to the office for a follow up visit 3 months for continued evaluation and treatment.  Boneta Lucks, DPM    No follow-ups on file.

## 2020-10-01 ENCOUNTER — Other Ambulatory Visit: Payer: Self-pay | Admitting: Internal Medicine

## 2020-10-19 ENCOUNTER — Ambulatory Visit: Payer: BC Managed Care – PPO | Admitting: Internal Medicine

## 2020-10-22 ENCOUNTER — Other Ambulatory Visit: Payer: Self-pay | Admitting: Internal Medicine

## 2020-11-04 ENCOUNTER — Other Ambulatory Visit: Payer: Self-pay | Admitting: Cardiology

## 2020-11-07 NOTE — Telephone Encounter (Signed)
Pt's pharmacy is requesting a refill on fluticasone (Flonase) nasal spray. Would Dr. Radford Pax like to refill this medication? Please address

## 2020-11-09 ENCOUNTER — Ambulatory Visit: Payer: BC Managed Care – PPO | Admitting: Cardiology

## 2020-11-15 ENCOUNTER — Other Ambulatory Visit: Payer: Self-pay | Admitting: Internal Medicine

## 2020-11-21 ENCOUNTER — Telehealth: Payer: Self-pay | Admitting: *Deleted

## 2020-11-21 ENCOUNTER — Ambulatory Visit: Payer: BC Managed Care – PPO | Admitting: Cardiology

## 2020-11-21 ENCOUNTER — Other Ambulatory Visit: Payer: Self-pay

## 2020-11-21 ENCOUNTER — Encounter: Payer: Self-pay | Admitting: Cardiology

## 2020-11-21 VITALS — BP 178/92 | HR 88 | Ht 77.0 in | Wt 323.0 lb

## 2020-11-21 DIAGNOSIS — I10 Essential (primary) hypertension: Secondary | ICD-10-CM | POA: Diagnosis not present

## 2020-11-21 DIAGNOSIS — I4892 Unspecified atrial flutter: Secondary | ICD-10-CM | POA: Diagnosis not present

## 2020-11-21 DIAGNOSIS — G4733 Obstructive sleep apnea (adult) (pediatric): Secondary | ICD-10-CM

## 2020-11-21 LAB — BASIC METABOLIC PANEL
BUN/Creatinine Ratio: 10 (ref 10–24)
BUN: 18 mg/dL (ref 8–27)
CO2: 22 mmol/L (ref 20–29)
Calcium: 9.5 mg/dL (ref 8.6–10.2)
Chloride: 105 mmol/L (ref 96–106)
Creatinine, Ser: 1.77 mg/dL — ABNORMAL HIGH (ref 0.76–1.27)
Glucose: 127 mg/dL — ABNORMAL HIGH (ref 65–99)
Potassium: 4 mmol/L (ref 3.5–5.2)
Sodium: 142 mmol/L (ref 134–144)
eGFR: 43 mL/min/{1.73_m2} — ABNORMAL LOW (ref >59)

## 2020-11-21 MED ORDER — OLMESARTAN MEDOXOMIL-HCTZ 40-12.5 MG PO TABS: 1.0000 | ORAL_TABLET | Freq: Every day | ORAL | 3 refills | Status: DC

## 2020-11-21 MED ORDER — CARVEDILOL 25 MG PO TABS: 25.0000 mg | ORAL_TABLET | Freq: Two times a day (BID) | ORAL | 3 refills | Status: DC

## 2020-11-21 MED ORDER — APIXABAN 5 MG PO TABS: 5.0000 mg | ORAL_TABLET | Freq: Two times a day (BID) | ORAL | 5 refills | Status: DC

## 2020-11-21 MED ORDER — ISOSORB DINITRATE-HYDRALAZINE 20-37.5 MG PO TABS: 2.0000 | ORAL_TABLET | Freq: Three times a day (TID) | ORAL | 3 refills | Status: DC

## 2020-11-21 NOTE — Addendum Note (Signed)
Addended by: Antonieta Iba on: 11/21/2020 11:28 AM   Modules accepted: Orders

## 2020-11-21 NOTE — Patient Instructions (Signed)
Medication Instructions:  Your physician has recommended you make the following change in your medication: 1) INCREASE Bidil to 2 tablets 3 times per day *If you need a refill on your cardiac medications before your next appointment, please call your pharmacy*   Lab Work: TODAY: BMET If you have labs (blood work) drawn today and your tests are completely normal, you will receive your results only by: [place] (if you have MyChart) OR A paper copy in the mail If you have any lab test that is abnormal or we need to change your treatment, we will call you to review the results.  Follow-Up: At [facility], you and your health needs are our priority.  As part of our continuing mission to provide you with exceptional heart care, we have created designated Provider Care Teams.  These Care Teams include your primary Cardiologist (physician) and Advanced Practice Providers (APPs -  Physician Assistants and Nurse Practitioners) who all work together to provide you with the care you need, when you need it.  Your next appointment:   6 month(s)  The format for your next appointment:   In Person  Provider:   Fransico Him, MD

## 2020-11-21 NOTE — Telephone Encounter (Signed)
Please call monitor department at [phone] to schedule your appointment.

## 2020-11-21 NOTE — Progress Notes (Signed)
Cardiology Office Note    Date:  11/21/2020   ID:  Johnny [name], DOB 03-Sep-1955, MRN [id number]  PCP:  [name], MD  Cardiologist:  Fransico Him, MD   Chief Complaint  Patient presents with   Sleep Apnea   Hypertension   Atrial Flutter     History of Present Illness:  Johnny [name] is a 65 y.o. 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  He also has a hx of medical noncompliance.  His last 2D echo 08/2019 showed normal LVF with mild LVH and moderate biatrial enlargement. He has a hx of PAFlutter requiring TEE/DCCV in the past.   He underwent sleep study due to atrial flutter which showed severe OSA with an AHI of 92/hr and nocturnal hypoxemia with O2 sats as low as 85%.  He underwent CPAP titration but due to ongoing events he was transitioned to auto BiPAP .    When I saw him last he was having a lot of problems with nasal congestion and he was referred to ENT and was given Flonase and saline rinses and is doing much better.   He is doing well with his BiPAP device and thinks that he has gotten used to it.  He tolerates the mask and feels the pressure is adequate.  Since going on BiPAP he feels rested in the am and has no significant daytime sleepiness.  He denies any significant mouth or nasal dryness or nasal congestion.  He does not think that he snores.      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   Qualifier: Diagnosis of  By: Linda Hedges MD, Heinz Knuckles    Sleep apnea    Type II diabetes mellitus with renal manifestations, uncontrolled ([place]) 08/03/2007   Qualifier: Diagnosis of  By: Linda Hedges MD, Heinz Knuckles  Med metformin     Past Surgical History:  Procedure Laterality Date   CARDIOVERSION N/A 07/18/2019   Procedure: CARDIOVERSION;  Surgeon: Dorothy Spark, MD;  Location: [facility];  Service: Cardiovascular;  Laterality: N/A;   TEE WITHOUT CARDIOVERSION N/A 07/18/2019   Procedure: TRANSESOPHAGEAL ECHOCARDIOGRAM (TEE);  Surgeon: Dorothy Spark, MD;  Location[facility] ENDOSCOPY;  Service: Cardiovascular;  Laterality: N/A;   TOOTH EXTRACTION     XI ROBOTIC ASSISTED SIMPLE PROSTATECTOMY N/A 05/31/2020   Procedure: XI ROBOTIC ASSISTED SIMPLE PROSTATECTOMY-SUPRAPUBIC;  Surgeon: Cleon Gustin, MD;  Location: [facility] ORS;  Service: Urology;  Laterality: N/A;    Current Medications: Current Meds  Medication Sig   ammonium lactate (AMLACTIN) 12 % lotion Apply 1 application topically as needed for dry skin.   apixaban (ELIQUIS) 5 MG TABS tablet Take 1 tablet (5 mg total) by mouth 2 (two) times daily.   atorvastatin (  LIPITOR) 80 MG tablet Take 1 tablet (80 mg total) by mouth daily.   carvedilol (COREG) 25 MG tablet Take 1 tablet (25 mg total) by mouth 2 (two) times daily with a meal.   finasteride (PROSCAR) 5 MG tablet Take 1 tablet (5 mg total) by mouth daily.   fluticasone (FLONASE) 50 MCG/ACT nasal spray SPRAY 1 SPRAY INTO BOTH NOSTRILS DAILY.   glipiZIDE (GLUCOTROL) 5 MG tablet Take 1 tablet (5 mg total) by mouth 2 (two) times daily before a meal.   isosorbide-hydrALAZINE (BIDIL) 20-37.5 MG tablet Take 1 tablet by mouth 3 (three) times daily.   metFORMIN (GLUCOPHAGE-XR) 500 MG 24 hr tablet TAKE 1 TABLET BY MOUTH TWICE A DAY   olmesartan-hydrochlorothiazide (BENICAR HCT) 40-12.5 MG tablet TAKE 1 TABLET BY MOUTH EVERY DAY    TRULICITY 1.5 IF/5.3PH SOPN INJECT 1.5 MG INTO THE SKIN ONCE A WEEK.    Allergies:   Patient has no known allergies.   Social History   Socioeconomic History   Marital status: Married    Spouse name: Not on file   Number of children: 2   Years of education: 12   Highest education level: Not on file  Occupational History   Occupation: mfg  Tobacco Use   Smoking status: Never    Passive exposure: Yes   Smokeless tobacco: Never  Vaping Use   Vaping Use: Never used  Substance and Sexual Activity   Alcohol use: No   Drug use: No   Sexual activity: Yes  Other Topics Concern   Not on file  Social History Narrative   [employer]. [employer]. [employer] x 4 years. Married '94. 2 sons - '95, '96. Wife is healthy. Work - Banker.   Social Determinants of Radio broadcast assistant Strain: Not on file  Food Insecurity: Not on file  Transportation Needs: Not on file  Physical Activity: Not on file  Stress: Not on file  Social Connections: Not on file     Family History:  The patient's family history includes Cancer in his brother; Coronary artery disease in his father; Dementia in his mother; Diabetes in his father; HIV in his brother; Hyperlipidemia in his father; Hypertension in his father; Kidney disease in his father and mother; Memory loss in his mother; Nephrolithiasis in his father.   ROS:   Please see the history of present illness.    ROS All other systems reviewed and are negative.  No flowsheet data found.  PHYSICAL EXAM:   VS:  BP (!) 178/92   Pulse 88   Ht 6\' 5"  (1.956 m)   Wt (!) 323 lb (146.5 kg)   SpO2 97%   BMI 38.30 kg/m     GEN: Well nourished, well developed in no acute distress HEENT: Normal NECK: No JVD; No carotid bruits LYMPHATICS: No lymphadenopathy CARDIAC:RRR, no murmurs, rubs, gallops RESPIRATORY:  Clear to auscultation without rales, wheezing or rhonchi  ABDOMEN: Soft, non-tender, non-distended MUSCULOSKELETAL:  trace edema; No deformity  SKIN:  Warm and dry NEUROLOGIC:  Alert and oriented x 3 PSYCHIATRIC:  Normal affect   Wt Readings from Last 3 Encounters:  11/21/20 (!) 323 lb (146.5 kg)  08/09/20 (!) 314 lb 12.8 oz (142.8 kg)  07/13/20 295 lb (133.8 kg)      Studies/Labs Reviewed:   EKG:  EKG is not ordered today.  2D echo 08/2019 IMPRESSIONS    1. Left ventricular ejection fraction, by estimation, is 60 to 65%. The  left ventricle has normal function.  The left ventricle has no regional  wall motion abnormalities. There is mild concentric left ventricular  hypertrophy. Left ventricular diastolic  parameters are indeterminate.   2. Right ventricular systolic function is normal. The right ventricular  size is mildly enlarged. There is normal pulmonary artery systolic  pressure.   3. Left atrial size was moderately dilated.   4. Right atrial size was moderately dilated.   5. The mitral valve is normal in structure. No evidence of mitral valve  regurgitation. No evidence of mitral stenosis.   6. The aortic valve is normal in structure. Aortic valve regurgitation is  not visualized. No aortic stenosis is present.   7. Aortic dilatation noted. There is mild dilatation at the level of the  sinuses of Valsalva measuring 40 mm.   8. The inferior vena cava is normal in size with greater than 50%  respiratory variability, suggesting right atrial pressure of 3 mmHg.   Recent Labs: 03/28/2020: ALT 57; Magnesium 2.0 05/23/2020: Platelets 243 06/01/2020: BUN 29; Creatinine, Ser 1.88; Potassium 4.6; Sodium 136 06/03/2020: Hemoglobin 11.3   Lipid Panel    Component Value Date/Time   CHOL 123 09/05/2019 1101   TRIG 63 09/05/2019 1101   TRIG 63 05/01/2006 1329   HDL 31 (L) 09/05/2019 1101   CHOLHDL 4.0 09/05/2019 1101   CHOLHDL 6.3 07/15/2019 0408   VLDL 13 07/15/2019 0408   LDLCALC 79 09/05/2019 1101   LDLDIRECT 170.2 11/19/2012 1105    Additional studies/ records that were reviewed today include:  Labs and PAP  compliance download    ASSESSMENT:    1. OSA (obstructive sleep apnea)   2. Essential hypertension   3. Paroxysmal atrial flutter (HCC)      PLAN:  In order of problems listed above:  1.  OSA  -he has not been using his device because he has been getting a lot of nasal congestion to the point he cannot use his device -I have recommended that he try OTC Claritin daily as well as nasal saline spray 2 sprays each nostril twice daily -I will refer him to ENT to make sure there is no obstruction in his nasal passages  2.  HTN -BP is poorly controlled on exam today -Continue prescription drug management with carvedilol 25mg  BID, Benicar HCT 40-12.5mg  daily -increase Bildil  to 2 tablets 20-37.5mg  TID -check 48 hour BP monitor  3.  PAflutter -maintaining NSR on exam today -denies any further bleeding problems on DOAC>hematuria resolved (followed by Urology) -I have personally reviewed and interpreted outside PCP performed by patient's PCP which showed SCr 1.88, Hbg 11.3 in Jan 2022 and TSH 1.57 in March 2021 -repeat BMET today -Continue prescription drug management with Carvedilol 25mg  BID and Eliquis 5mg  BID    Followup with me in 6 months  Medication Adjustments/Labs and Tests Ordered: Current medicines are reviewed at length with the patient today.  Concerns regarding medicines are outlined above.  Medication changes, Labs and Tests ordered today are listed in the Patient Instructions below.  There are no Patient Instructions on file for this visit.   Signed, Fransico Him, MD  11/21/2020 10:52 AM    [facility] [address], [place], [place]  [address] Phone: [phone]; Fax: [phone]

## 2020-11-23 NOTE — Telephone Encounter (Signed)
Patient returned Shelly's call to schedule appt

## 2020-11-28 ENCOUNTER — Ambulatory Visit (INDEPENDENT_AMBULATORY_CARE_PROVIDER_SITE_OTHER): Payer: BC Managed Care – PPO

## 2020-11-28 ENCOUNTER — Other Ambulatory Visit: Payer: Self-pay | Admitting: Cardiology

## 2020-11-28 DIAGNOSIS — I10 Essential (primary) hypertension: Secondary | ICD-10-CM

## 2020-11-28 DIAGNOSIS — R030 Elevated blood-pressure reading, without diagnosis of hypertension: Secondary | ICD-10-CM

## 2020-11-28 NOTE — Progress Notes (Unsigned)
24 Hour ambulatory blood pressure applied with Large Adult Cuff.

## 2020-11-30 ENCOUNTER — Encounter: Payer: Self-pay | Admitting: Internal Medicine

## 2020-11-30 ENCOUNTER — Ambulatory Visit (INDEPENDENT_AMBULATORY_CARE_PROVIDER_SITE_OTHER): Payer: BC Managed Care – PPO | Admitting: Internal Medicine

## 2020-11-30 ENCOUNTER — Other Ambulatory Visit: Payer: Self-pay

## 2020-11-30 DIAGNOSIS — I10 Essential (primary) hypertension: Secondary | ICD-10-CM | POA: Diagnosis not present

## 2020-11-30 NOTE — Progress Notes (Signed)
   Subjective:   Patient ID: Johnny [name], male    DOB: 07/08/1955, 65 y.o.   MRN: [id number]  HPI The patient is a 65 YO man coming in for concerns about blood pressure. His cardiologist is doing 48 hour blood pressure and recently increased his bidil to 2 pills TID which is expensive and he is not wanting to continue on this. Unsure if BP is good. Denies chest pains or headaches. Also taking telmisartan/hctz and coreg.   Review of Systems  Constitutional: Negative.   HENT: Negative.    Eyes: Negative.   Respiratory:  Negative for cough, chest tightness and shortness of breath.   Cardiovascular:  Negative for chest pain, palpitations and leg swelling.  Gastrointestinal:  Negative for abdominal distention, abdominal pain, constipation, diarrhea, nausea and vomiting.  Musculoskeletal: Negative.   Skin: Negative.   Neurological: Negative.   Psychiatric/Behavioral: Negative.     Objective:  Physical Exam Constitutional:      Appearance: He is well-developed.  HENT:     Head: Normocephalic and atraumatic.  Cardiovascular:     Rate and Rhythm: Normal rate and regular rhythm.  Pulmonary:     Effort: Pulmonary effort is normal. No respiratory distress.     Breath sounds: Normal breath sounds. No wheezing or rales.  Abdominal:     General: Bowel sounds are normal. There is no distension.     Palpations: Abdomen is soft.     Tenderness: There is no abdominal tenderness. There is no rebound.  Musculoskeletal:     Cervical back: Normal range of motion.  Skin:    General: Skin is warm and dry.  Neurological:     Mental Status: He is alert and oriented to person, place, and time.     Coordination: Coordination normal.    Vitals:   11/30/20 1358  BP: (!) 144/80  Pulse: 72  Resp: 18  Temp: 98.9 F (37.2 C)  TempSrc: Oral  SpO2: 99%  Weight: (!) 331 lb 9.6 oz (150.4 kg)  Height: 6\' 5"  (1.956 m)    This visit occurred during the SARS-CoV-2 public health emergency.  Safety  protocols were in place, including screening questions prior to the visit, additional usage of staff PPE, and extensive cleaning of exam room while observing appropriate contact time as indicated for disinfecting solutions.   Assessment & Plan:

## 2020-11-30 NOTE — Patient Instructions (Signed)
We will wait for the results of the blood pressure monitor and then adjust the medicines with Dr. Radford Pax.

## 2020-11-30 NOTE — Assessment & Plan Note (Signed)
Will consult with his cardiologist to let them know that bidil is expensive and he would like to change if possible. Continue coreg 25 mg BID and telmisartan 40/12.5 mg daily and bidil 2 pills TID until we can get results of monitor.

## 2020-12-04 ENCOUNTER — Other Ambulatory Visit: Payer: Self-pay | Admitting: Internal Medicine

## 2020-12-04 MED ORDER — HYDRALAZINE HCL 50 MG PO TABS: 50.0000 mg | ORAL_TABLET | Freq: Three times a day (TID) | ORAL | 1 refills | Status: DC

## 2020-12-04 MED ORDER — ISOSORBIDE MONONITRATE ER 60 MG PO TB24: 60.0000 mg | ORAL_TABLET | Freq: Every day | ORAL | 1 refills | Status: DC

## 2020-12-12 ENCOUNTER — Other Ambulatory Visit: Payer: Self-pay

## 2020-12-12 ENCOUNTER — Ambulatory Visit (INDEPENDENT_AMBULATORY_CARE_PROVIDER_SITE_OTHER): Payer: BC Managed Care – PPO | Admitting: Podiatry

## 2020-12-12 DIAGNOSIS — E1142 Type 2 diabetes mellitus with diabetic polyneuropathy: Secondary | ICD-10-CM

## 2020-12-12 DIAGNOSIS — B351 Tinea unguium: Secondary | ICD-10-CM | POA: Diagnosis not present

## 2020-12-12 DIAGNOSIS — M79675 Pain in left toe(s): Secondary | ICD-10-CM | POA: Diagnosis not present

## 2020-12-12 DIAGNOSIS — M79674 Pain in right toe(s): Secondary | ICD-10-CM

## 2020-12-14 NOTE — Progress Notes (Signed)
  Subjective:  Patient ID: Johnny [name], male    DOB: 16-Sep-1955,  MRN: [id number]  Chief Complaint  Patient presents with   Nail Problem    Diabetic foot care    65 y.o. male returns for the above complaint.  Patient presents with thickened elongated dystrophic toenails x10.  Patient is a painful to walk on.  Patient would like to have them debrided down as he is not able to do it himself.  Patient is a diabetic with last A1c of 7.6.  He does not have any secondary complaints.  Objective:  There were no vitals filed for this visit. Podiatric Exam: Vascular: dorsalis pedis and posterior tibial pulses are palpable bilateral. Capillary return is immediate. Temperature gradient is WNL. Skin turgor WNL  Sensorium: Normal Semmes Weinstein monofilament test. Normal tactile sensation bilaterally. Nail Exam: Pt has thick disfigured discolored nails with subungual debris noted bilateral entire nail hallux through fifth toenails.  Pain on palpation to the nails. Ulcer Exam: There is no evidence of ulcer or pre-ulcerative changes or infection. Orthopedic Exam: Muscle tone and strength are WNL. No limitations in general ROM. No crepitus or effusions noted. HAV  B/L.  Hammer toes 2-5  B/L. Skin: No Porokeratosis. No infection or ulcers.  Bilateral severe xerosis noted to bilateral lower extremity    Assessment & Plan:   1. Type 2 diabetes mellitus with diabetic polyneuropathy, without long-term current use of insulin (HCC)   2. Pain due to onychomycosis of toenails of both feet      Patient was evaluated and treated and all questions answered.  Xerosis skin severe bilateral lower extremity -I explained to the patient the etiology of xerosis and various treatment options were extensively discussed.  I explained to the patient the importance of maintaining moisturization of the skin with application of over-the-counter lotion such as Eucerin or Luciderm which she has failed.  I believe patient  will benefit from prescription lotion.  Ammonium lactate was sent to the pharmacy.  I have asked him to apply twice a day.  Patient states understanding   Onychomycosis with pain  -Nails palliatively debrided as below. -Educated on self-care  Procedure: Nail Debridement Rationale: pain  Type of Debridement: manual, sharp debridement. Instrumentation: Nail nipper, rotary burr. Number of Nails: 10  Procedures and Treatment: Consent by patient was obtained for treatment procedures. The patient understood the discussion of treatment and procedures well. All questions were answered thoroughly reviewed. Debridement of mycotic and hypertrophic toenails, 1 through 5 bilateral and clearing of subungual debris. No ulceration, no infection noted.  Return Visit-Office Procedure: Patient instructed to return to the office for a follow up visit 3 months for continued evaluation and treatment.  Boneta Lucks, DPM    No follow-ups on file.

## 2020-12-24 ENCOUNTER — Encounter: Payer: Self-pay | Admitting: Internal Medicine

## 2020-12-24 ENCOUNTER — Other Ambulatory Visit: Payer: Self-pay

## 2020-12-24 ENCOUNTER — Ambulatory Visit (INDEPENDENT_AMBULATORY_CARE_PROVIDER_SITE_OTHER): Payer: BC Managed Care – PPO | Admitting: Internal Medicine

## 2020-12-24 VITALS — BP 154/80 | HR 82 | Ht 77.0 in | Wt 325.4 lb

## 2020-12-24 DIAGNOSIS — E1165 Type 2 diabetes mellitus with hyperglycemia: Secondary | ICD-10-CM

## 2020-12-24 DIAGNOSIS — N1831 Chronic kidney disease, stage 3a: Secondary | ICD-10-CM | POA: Diagnosis not present

## 2020-12-24 DIAGNOSIS — E1142 Type 2 diabetes mellitus with diabetic polyneuropathy: Secondary | ICD-10-CM

## 2020-12-24 DIAGNOSIS — E1122 Type 2 diabetes mellitus with diabetic chronic kidney disease: Secondary | ICD-10-CM

## 2020-12-24 LAB — POCT GLYCOSYLATED HEMOGLOBIN (HGB A1C): Hemoglobin A1C: 7.5 % — AB (ref 4.0–5.6)

## 2020-12-24 MED ORDER — METFORMIN HCL ER 500 MG PO TB24: 500.0000 mg | ORAL_TABLET | Freq: Two times a day (BID) | ORAL | 3 refills | Status: DC

## 2020-12-24 MED ORDER — TRULICITY 3 MG/0.5ML ~~LOC~~ SOAJ: 3.0000 mg | SUBCUTANEOUS | 3 refills | Status: DC

## 2020-12-24 MED ORDER — GLIPIZIDE 5 MG PO TABS: 5.0000 mg | ORAL_TABLET | Freq: Every day | ORAL | 1 refills | Status: DC

## 2020-12-24 NOTE — Progress Notes (Signed)
Name: Johnny [name]  Age/ Sex: 65 y.o., male   MRN/ DOB: [id number], 04-19-1956     PCP: [name], MD   Reason for Endocrinology Evaluation: Type 2 Diabetes Mellitus  Initial Endocrine Consultative Visit: 08/20/2018    PATIENT IDENTIFIER: [name] is a 65 y.o. male with a past medical history of T2DM, psoriasis, HTN and OSA (Dx 08/2019) . The patient has followed with Endocrinology clinic since 08/20/2018 for consultative assistance with management of his diabetes.     DIABETIC HISTORY:  Mr. Lepkowski was diagnosed with T2DM in 2012,. He was on Metformin,Farxiga and Glimepiride in the past. He has never been on insulin. His hemoglobin A1c has ranged from 9.0 % in 2013, peaking at 13.5% in 06/2018.  On his initial visit to our clinic his A1c was 13.5%. He was not taking any of his meds. He was restarted on Metformin and glipizide.   Trulicity was started 06/7251  Hyperaldosteronism was ruled out in 09/2018 for extreme HTN  SUBJECTIVE:   During the last visit (06/08/2020): A1c 7.6 %.  we continued metformin and decreased Glipizide and increased Trulicity   Today (6/64/4034): Mr. Sallade is here for a  follow up on his diabetes.  He checks his blood sugars 2 times daily, preprandial to breakfast and supper. The patient has had hypoglycemic episodes since the last clinic visit.   S/P prostatectomy 05/31/2020  Denies nausea and diarrhea    Follows with Podiatry - Dr. Posey Pronto   HOME DIABETES REGIMEN:  Metformin 500 mg XR 1 tablets BID with meals Glipizide 5 mg,  1 tablet BID Trulicity 1.5 mg weekly ( Saturday )     METER DOWNLOAD SUMMARY: 8/8-8/22/2022  Average Number Tests/Day = 0.8 Overall Mean FS Glucose = 84 Standard Deviation = 16  BG Ranges: Low = 59 High = 108   Hypoglycemic Events/30 Days: BG < 50 = 0 Episodes of symptomatic severe hypoglycemia = 0     DIABETIC COMPLICATIONS: Microvascular complications:  Neuropathy, CKD Denies:  retinopathy  Last eye exam: Completed 05/2020   Macrovascular complications:    Denies: CAD, PVD, CVA     HISTORY:  Past Medical History:  Past Medical History:  Diagnosis Date   CKD (chronic kidney disease) stage 3, GFR 30-59 ml/min ([place]) 06/10/2017   Constipation 03/27/2020   Diabetes mellitus    Dysrhythmia    A flutter   Essential hypertension 08/03/2007   Qualifier: Diagnosis of  By: Linda Hedges MD, Heinz Knuckles  Med ARB, diuretic     Guttate psoriasis    Hyperlipidemia associated with type 2 diabetes mellitus ([place]) 08/03/2007   Qualifier: Diagnosis of  By: Linda Hedges MD, Heinz Knuckles  meds - none    Paroxysmal atrial flutter ([place])    S/P TEE/DCCV 07/2019   PSORIASIS, 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   Qualifier: Diagnosis of  By: Linda Hedges MD, Heinz Knuckles    Sleep apnea    Type II diabetes mellitus with renal manifestations, uncontrolled ([place]) 08/03/2007   Qualifier: Diagnosis of  By: Linda Hedges MD, Heinz Knuckles  Med metformin    Past Surgical History:  Past Surgical History:  Procedure Laterality Date   CARDIOVERSION N/A 07/18/2019   Procedure: CARDIOVERSION;  Surgeon: Dorothy Spark, MD;  Location: [facility];  Service: Cardiovascular;  Laterality: N/A;   TEE WITHOUT CARDIOVERSION N/A 07/18/2019   Procedure: TRANSESOPHAGEAL ECHOCARDIOGRAM (TEE);  Surgeon: Dorothy Spark, MD;  Location: [facility];  Service: Cardiovascular;  Laterality: N/A;   TOOTH EXTRACTION  XI ROBOTIC ASSISTED SIMPLE PROSTATECTOMY N/A 05/31/2020   Procedure: XI ROBOTIC ASSISTED SIMPLE PROSTATECTOMY-SUPRAPUBIC;  Surgeon: Cleon Gustin, MD;  Location: [facility];  Service: Urology;  Laterality: N/A;   Social History:  reports that he has never smoked. He has been exposed to tobacco smoke. He has never used smokeless tobacco. He reports that he does not drink alcohol and does not use drugs. Family History:  Family History  Problem Relation Age of Onset   Hypertension Father        DOB [phone]   Hyperlipidemia Father    Diabetes  Father    Coronary artery disease Father        with stents   Nephrolithiasis Father    Kidney disease Father        s/p rejected kidney transplant; on HD   Memory loss Mother        DOB 52   Kidney disease Mother        s/p nephrectomy, infection problem   Dementia Mother    Cancer Brother        lung   HIV Brother    Prostate cancer Neg Hx    Colon cancer Neg Hx      HOME MEDICATIONS: Allergies as of 12/24/2020   No Known Allergies      Medication List        Accurate as of December 24, 2020  3:30 PM. If you have any questions, ask your nurse or doctor.          ammonium lactate 12 % lotion Commonly known as: AmLactin Apply 1 application topically as needed for dry skin.   apixaban 5 MG Tabs tablet Commonly known as: ELIQUIS Take 1 tablet (5 mg total) by mouth 2 (two) times daily.   atorvastatin 80 MG tablet Commonly known as: LIPITOR Take 1 tablet (80 mg total) by mouth daily.   carvedilol 25 MG tablet Commonly known as: COREG Take 1 tablet (25 mg total) by mouth 2 (two) times daily with a meal.   finasteride 5 MG tablet Commonly known as: PROSCAR Take 1 tablet (5 mg total) by mouth daily.   fluticasone 50 MCG/ACT nasal spray Commonly known as: FLONASE SPRAY 1 SPRAY INTO BOTH NOSTRILS DAILY.   glipiZIDE 5 MG tablet Commonly known as: GLUCOTROL Take 1 tablet (5 mg total) by mouth 2 (two) times daily before a meal.   hydrALAZINE 50 MG tablet Commonly known as: APRESOLINE Take 1 tablet (50 mg total) by mouth 3 (three) times daily.   isosorbide mononitrate 60 MG 24 hr tablet Commonly known as: IMDUR Take 1 tablet (60 mg total) by mouth daily.   metFORMIN 500 MG 24 hr tablet Commonly known as: GLUCOPHAGE-XR TAKE 1 TABLET BY MOUTH TWICE A DAY   olmesartan-hydrochlorothiazide 40-12.5 MG tablet Commonly known as: BENICAR HCT Take 1 tablet by mouth daily.   Trulicity 1.5 TF/5.7DU Sopn Generic drug: Dulaglutide INJECT 1.5 MG INTO THE SKIN ONCE  A WEEK.         OBJECTIVE:   Vital Signs: BP (!) 160/84 (BP Location: Left Arm, Patient Position: Sitting, Cuff Size: Large)   Pulse 82   Ht 6\' 5"  (1.956 m)   Wt (!) 325 lb 6.4 oz (147.6 kg)   SpO2 94%   BMI 38.59 kg/m   Wt Readings from Last 3 Encounters:  12/24/20 (!) 325 lb 6.4 oz (147.6 kg)  11/30/20 (!) 331 lb 9.6 oz (150.4 kg)  11/21/20 (!) 323 lb (146.5 kg)  Exam: General: Pt appears well and is in NAD  Lungs: Clear with good BS bilat with no rales, rhonchi, or wheezes  Heart: RRR with normal S1 and S2 and no gallops; no murmurs; no rub  Extremities: Trace pretibial edema.  Skin: Normal texture and temperature to palpation. No rash noted.  Neuro: MS is good with appropriate affect, pt is alert and Ox3    DM foot exam: 02/03/2020 The skin of the feet is without sores or ulcerations, but has extensive plantar callous formation bilaterally.Nails thickened, discolored and curved The pedal pulses are undetectable today due to edema  The sensation is decreased to a screening 5.07, 10 gram monofilament bilaterally           DATA REVIEWED:  Lab Results  Component Value Date   HGBA1C 7.6 (A) 06/08/2020   HGBA1C 8.2 (H) 05/23/2020   HGBA1C 6.9 (A) 02/03/2020   Lab Results  Component Value Date   MICROALBUR 9.8 (H) 02/03/2020   LDLCALC 79 09/05/2019   CREATININE 1.77 (H) 11/21/2020    ASSESSMENT / PLAN / RECOMMENDATIONS:   1) Type 2 Diabetes Mellitus, Sub- Optimally controlled, With CKD III and neuropathic  complications - Most recent A1c of 7.5 %. Goal A1c < 7.0 %.   - His A1c is stable but remains above goal. -In review of his meter download the patient has been noted with hypoglycemic episodes in the morning.  I am going to stop his glipizide with supper -I am also going to increase his Trulicity -We also discussed add-on therapy with SGLT2 inhibitors   MEDICATIONS: Continue Metformin 500 mg BID  Decrease glipizide  5 mg, to 1 Tablet before  Breakfast only Increase Trulicity to 3.0  mg weekly   EDUCATION / INSTRUCTIONS: BG monitoring instructions: Patient is instructed to check his blood sugars 2 times a day, fasting and bedtime. Call [facility] if: BG persistently < 70  I reviewed the Rule of 15 for the treatment of hypoglycemia in detail with the patient. Literature supplied.   2. HTN:  -Has been following up with his PCP for blood pressure management.  Repeat BP today has been trending down to 154/80 mmHg, has a follow-up with his PCP for blood pressure management on 8/31  F/U in 6 months    Signed electronically by: Mack Guise, MD  [facility]  [facility] [address] [place], [place] [address] Phone: [phone] FAX: [phone]   CC: [name], [place] [address] Phone: [phone]  Fax: [phone]  Return to Endocrinology clinic as below: Future Appointments  Date Time Provider [facility] [place] [place]  01/01/2021  3:00 PM [name], MD [facility] None  01/18/2021 12:15 PM McKenzie, Candee Furbish, MD [facility] None  03/20/2021  4:15 PM Felipa Furnace, DPM [facility]

## 2020-12-24 NOTE — Patient Instructions (Signed)
-   Continue Metformin 500 mg 1 tablet with Breakfast and 1 tablet with supper - Decrease Glipizide 5 mg, 1 tablet before breakfast ( Do not take any with supper) - Increase Trulicity to 3 mg once a week        - HOW TO TREAT LOW BLOOD SUGARS (Blood sugar LESS THAN 70 MG/DL) Please follow the RULE OF 15 for the treatment of hypoglycemia treatment (when your (blood sugars are less than 70 mg/dL)   STEP 1: Take 15 grams of carbohydrates when your blood sugar is low, which includes:  3-4 GLUCOSE TABS  OR 3-4 OZ OF JUICE OR REGULAR SODA OR ONE TUBE OF GLUCOSE GEL    STEP 2: RECHECK blood sugar in 15 MINUTES STEP 3: If your blood sugar is still low at the 15 minute recheck --> then, go back to STEP 1 and treat AGAIN with another 15 grams of carbohydrates.

## 2021-01-01 ENCOUNTER — Encounter: Payer: Self-pay | Admitting: Internal Medicine

## 2021-01-01 ENCOUNTER — Other Ambulatory Visit: Payer: Self-pay

## 2021-01-01 ENCOUNTER — Ambulatory Visit (INDEPENDENT_AMBULATORY_CARE_PROVIDER_SITE_OTHER): Payer: BC Managed Care – PPO | Admitting: Internal Medicine

## 2021-01-01 VITALS — BP 130/80 | HR 90 | Temp 99.1°F | Resp 18 | Ht 77.0 in | Wt 327.8 lb

## 2021-01-01 DIAGNOSIS — I10 Essential (primary) hypertension: Secondary | ICD-10-CM

## 2021-01-01 DIAGNOSIS — E118 Type 2 diabetes mellitus with unspecified complications: Secondary | ICD-10-CM | POA: Diagnosis not present

## 2021-01-01 DIAGNOSIS — N183 Chronic kidney disease, stage 3 unspecified: Secondary | ICD-10-CM | POA: Diagnosis not present

## 2021-01-01 LAB — BASIC METABOLIC PANEL
BUN: 17 mg/dL (ref 6–23)
CO2: 27 mEq/L (ref 19–32)
Calcium: 9.3 mg/dL (ref 8.4–10.5)
Chloride: 105 mEq/L (ref 96–112)
Creatinine, Ser: 1.6 mg/dL — ABNORMAL HIGH (ref 0.40–1.50)
GFR: 45.81 mL/min — ABNORMAL LOW (ref >60.00)
Glucose, Bld: 80 mg/dL (ref 70–99)
Potassium: 3.8 mEq/L (ref 3.5–5.1)
Sodium: 140 mEq/L (ref 135–145)

## 2021-01-01 NOTE — Assessment & Plan Note (Addendum)
Checking BMP as recent adjustments to BP medications. Adjust as needed. Currently BP and diabetes both well controlled. On ARB.

## 2021-01-01 NOTE — Patient Instructions (Signed)
We will check the labs and leave the medicines the same.

## 2021-01-01 NOTE — Progress Notes (Signed)
   Subjective:   Patient ID: Johnny [name], male    DOB: 28-Jun-1955, 65 y.o.   MRN: [id number]  HPI The patient is a 65 YO man coming in for follow up BP regimen change. About 1 month ago we decided to have him take imdur 60 mg daily and hydralazine 50 mg TID and coreg 25 mg BID and olmesartan/hctz 40/12.5 mg daily. BP is running good at home and no headaches or chest pains.   Review of Systems  Constitutional: Negative.   HENT: Negative.    Eyes: Negative.   Respiratory:  Negative for cough, chest tightness and shortness of breath.   Cardiovascular:  Negative for chest pain, palpitations and leg swelling.  Gastrointestinal:  Negative for abdominal distention, abdominal pain, constipation, diarrhea, nausea and vomiting.  Musculoskeletal: Negative.   Skin: Negative.   Neurological: Negative.   Psychiatric/Behavioral: Negative.     Objective:  Physical Exam Constitutional:      Appearance: He is well-developed. He is obese.  HENT:     Head: Normocephalic and atraumatic.  Cardiovascular:     Rate and Rhythm: Normal rate and regular rhythm.  Pulmonary:     Effort: Pulmonary effort is normal. No respiratory distress.     Breath sounds: Normal breath sounds. No wheezing or rales.  Abdominal:     General: Bowel sounds are normal. There is no distension.     Palpations: Abdomen is soft.     Tenderness: There is no abdominal tenderness. There is no rebound.  Musculoskeletal:     Cervical back: Normal range of motion.  Skin:    General: Skin is warm and dry.  Neurological:     Mental Status: He is alert and oriented to person, place, and time.     Coordination: Coordination normal.    Vitals:   01/01/21 1501  BP: 130/80  Pulse: 90  Resp: 18  Temp: 99.1 F (37.3 C)  TempSrc: Oral  SpO2: 99%  Weight: (!) 327 lb 12.8 oz (148.7 kg)  Height: 6\' 5"  (1.956 m)    This visit occurred during the SARS-CoV-2 public health emergency.  Safety protocols were in place, including  screening questions prior to the visit, additional usage of staff PPE, and extensive cleaning of exam room while observing appropriate contact time as indicated for disinfecting solutions.   Assessment & Plan:

## 2021-01-01 NOTE — Assessment & Plan Note (Signed)
Recent HgA1c 7.5 and seeing endo for management.

## 2021-01-01 NOTE — Assessment & Plan Note (Signed)
BP at goal on imdur 60 mg daily, hydralazine 50 mg TID, olmesartan/hctz 40/12.5 mg and coreg 25 mg BID. Checking BMP to assess for change to renal function with medication adjustment.

## 2021-01-18 ENCOUNTER — Ambulatory Visit (INDEPENDENT_AMBULATORY_CARE_PROVIDER_SITE_OTHER): Payer: BC Managed Care – PPO | Admitting: Urology

## 2021-01-18 ENCOUNTER — Other Ambulatory Visit: Payer: Self-pay

## 2021-01-18 ENCOUNTER — Encounter: Payer: Self-pay | Admitting: Urology

## 2021-01-18 DIAGNOSIS — R310 Gross hematuria: Secondary | ICD-10-CM | POA: Diagnosis not present

## 2021-01-18 DIAGNOSIS — N138 Other obstructive and reflux uropathy: Secondary | ICD-10-CM | POA: Diagnosis not present

## 2021-01-18 DIAGNOSIS — N401 Enlarged prostate with lower urinary tract symptoms: Secondary | ICD-10-CM | POA: Diagnosis not present

## 2021-01-18 DIAGNOSIS — R351 Nocturia: Secondary | ICD-10-CM | POA: Diagnosis not present

## 2021-01-18 NOTE — Patient Instructions (Signed)
Benign Prostatic Hyperplasia Benign prostatic hyperplasia (BPH) is an enlarged prostate gland that is caused by the normal aging process and not by cancer. The prostate is a walnut-sized gland that is involved in the production of semen. It is located in front of the rectum and below the bladder. The bladder stores urine and the urethra is the tube that carries the urine out of the body. The prostate may get bigger as a man gets older. An enlarged prostate can press on the urethra. This can make it harder to pass urine. The build-up of urine in the bladder can cause infection. Back pressure and infection may progress to bladder damage and kidney (renal) failure. What are the causes? This condition is part of a normal aging process. However, not all men develop problems from this condition. If the prostate enlarges away from the urethra, urine flow will not be blocked. If it enlarges toward the urethra and compresses it, there will be problems passing urine. What increases the risk? This condition is more likely to develop in men over the age of 50 years. What are the signs or symptoms? Symptoms of this condition include: Getting up often during the night to urinate. Needing to urinate frequently during the day. Difficulty starting urine flow. Decrease in size and strength of your urine stream. Leaking (dribbling) after urinating. Inability to pass urine. This needs immediate treatment. Inability to completely empty your bladder. Pain when you pass urine. This is more common if there is also an infection. Urinary tract infection (UTI). How is this diagnosed? This condition is diagnosed based on your medical history, a physical exam, and your symptoms. Tests will also be done, such as: A post-void bladder scan. This measures any amount of urine that may remain in your bladder after you finish urinating. A digital rectal exam. In a rectal exam, your health care provider checks your prostate by  putting a lubricated, gloved finger into your rectum to feel the back of your prostate gland. This exam detects the size of your gland and any abnormal lumps or growths. An exam of your urine (urinalysis). A prostate specific antigen (PSA) screening. This is a blood test used to screen for prostate cancer. An ultrasound. This test uses sound waves to electronically produce a picture of your prostate gland. Your health care provider may refer you to a specialist in kidney and prostate diseases (urologist). How is this treated? Once symptoms begin, your health care provider will monitor your condition (active surveillance or watchful waiting). Treatment for this condition will depend on the severity of your condition. Treatment may include: Observation and yearly exams. This may be the only treatment needed if your condition and symptoms are mild. Medicines to relieve your symptoms, including: Medicines to shrink the prostate. Medicines to relax the muscle of the prostate. Surgery in severe cases. Surgery may include: Prostatectomy. In this procedure, the prostate tissue is removed completely through an open incision or with a laparoscope or robotics. Transurethral resection of the prostate (TURP). In this procedure, a tool is inserted through the opening at the tip of the penis (urethra). It is used to cut away tissue of the inner core of the prostate. The pieces are removed through the same opening of the penis. This removes the blockage. Transurethral incision (TUIP). In this procedure, small cuts are made in the prostate. This lessens the prostate's pressure on the urethra. Transurethral microwave thermotherapy (TUMT). This procedure uses microwaves to create heat. The heat destroys and removes a   small amount of prostate tissue. Transurethral needle ablation (TUNA). This procedure uses radio frequencies to destroy and remove a small amount of prostate tissue. Interstitial laser coagulation (ILC).  This procedure uses a laser to destroy and remove a small amount of prostate tissue. Transurethral electrovaporization (TUVP). This procedure uses electrodes to destroy and remove a small amount of prostate tissue. Prostatic urethral lift. This procedure inserts an implant to push the lobes of the prostate away from the urethra. Follow these instructions at home: Take over-the-counter and prescription medicines only as told by your health care provider. Monitor your symptoms for any changes. Contact your health care provider with any changes. Avoid drinking large amounts of liquid before going to bed or out in public. Avoid or reduce how much caffeine or alcohol you drink. Give yourself time when you urinate. Keep all follow-up visits as told by your health care provider. This is important. Contact a health care provider if: You have unexplained back pain. Your symptoms do not get better with treatment. You develop side effects from the medicine you are taking. Your urine becomes very dark or has a bad smell. Your lower abdomen becomes distended and you have trouble passing your urine. Get help right away if: You have a fever or chills. You suddenly cannot urinate. You feel lightheaded, or very dizzy, or you faint. There are large amounts of blood or clots in the urine. Your urinary problems become hard to manage. You develop moderate to severe low back or flank pain. The flank is the side of your body between the ribs and the hip. These symptoms may represent a serious problem that is an emergency. Do not wait to see if the symptoms will go away. Get medical help right away. Call your local emergency services (911 in the [place] Do not drive yourself to the hospital. Summary Benign prostatic hyperplasia (BPH) is an enlarged prostate that is caused by the normal aging process and not by cancer. An enlarged prostate can press on the urethra. This can make it hard to pass urine. This  condition is part of a normal aging process and is more likely to develop in men over the age of 50 years. Get help right away if you suddenly cannot urinate. This information is not intended to replace advice given to you by your health care provider. Make sure you discuss any questions you have with your health care provider. Document Revised: 08/01/2020 Document Reviewed: 12/29/2019 Elsevier Patient Education  2022 [employer]  

## 2021-01-18 NOTE — Progress Notes (Signed)
01/18/2021 12:12 PM   [place] [birth year] [id number]  Referring provider: [name], MD [address] [place],  [place] [address]  Patient location: home Physician location: office I connected with  Johnny [name] on 01/18/21 by a video enabled telemedicine application and verified that I am speaking with the correct person using two identifiers.   I discussed the limitations of evaluation and management by telemedicine. The patient expressed understanding and agreed to proceed.    Followup BPH and gross hematuria   HPI: Johnny [name] is a 65yo here for followup for BPH with urinary retention and gross hematuria. He was started on finasteride last visit. He has not had gross hematuria since last visit. He has stable mild LUTS.  Nocturia 0x on his CPAP. Urine stream strong. No urinary urgency or frequency. No straining to urinate . No feeling of incomplete emptying. Overall he is very pleased with his urination. 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   Qualifier: Diagnosis of  By: Linda Hedges MD, Heinz Knuckles    Sleep apnea    Type II diabetes mellitus with renal manifestations, uncontrolled ([place]) 08/03/2007   Qualifier: Diagnosis of  By: Linda Hedges MD, Heinz Knuckles  Med metformin     Surgical History: Past Surgical History:  Procedure Laterality Date   CARDIOVERSION N/A 07/18/2019   Procedure: CARDIOVERSION;  Surgeon: Dorothy Spark,  MD;  Location[facility] END[facility]COPY;  Service: Cardiovascular;  Laterality: N/A;   TEE WITHOUT CARDIOVERSION N/A 07/18/2019   Procedure: TRANSESOPHAGEAL ECHOCARDIOGRAM (TEE);  Surgeon: Dorothy Spark, MD;  Location: [facility] ENDOSCOPY;  Service: Cardiovascular;  Laterality: N/A;   TOOTH EXTRACTION     XI ROBOTIC ASSISTED SIMPLE PROSTATECTOMY N/A 05/31/2020   Procedure: XI ROBOTIC ASSISTED SIMPLE PROSTATECTOMY-SUPRAPUBIC;  Surgeon: Cleon Gustin, MD;  Location: [facility] ORS;  Service: Urology;  Laterality: N/A;    Home Medications:  Allergies as of 01/18/2021   No Known Allergies      Medication List        Accurate as of January 18, 2021 12:13 PM. If you have any questions, ask your nurse or doctor.          ammonium lactate 12 % lotion Commonly known as: AmLactin Apply 1 application topically as needed for dry skin.   apixaban 5 MG Tabs tablet Commonly known as: ELIQUIS Take 1 tablet (5 mg total) by mouth 2 (two) times daily.   atorvastatin 80 MG tablet Commonly known as: LIPITOR Take 1 tablet (80 mg total) by mouth daily.   carvedilol 25 MG tablet Commonly known as: COREG Take 1 tablet (25 mg total) by mouth 2 (two) times daily with a meal.   finasteride 5 MG tablet Commonly known as: PROSCAR Take 1 tablet (5 mg total) by mouth daily.   fluticasone 50 MCG/ACT nasal spray Commonly known as: FLONASE SPRAY 1 SPRAY INTO  BOTH NOSTRILS DAILY.   glipiZIDE 5 MG tablet Commonly known as: GLUCOTROL Take 1 tablet (5 mg total) by mouth daily before breakfast.   hydrALAZINE 50 MG tablet Commonly known as: APRESOLINE Take 1 tablet (50 mg total) by mouth 3 (three) times daily.   isosorbide-hydrALAZINE 20-37.5 MG tablet Commonly known as: BIDIL Take 3 tablets by mouth daily.   metFORMIN 500 MG 24 hr tablet Commonly known as: GLUCOPHAGE-XR Take 1 tablet (500 mg total) by mouth 2 (two) times daily.   olmesartan-hydrochlorothiazide 40-12.5 MG tablet Commonly known as: BENICAR  HCT Take 1 tablet by mouth daily.   Trulicity 3 NK/5.3ZJ Sopn Generic drug: Dulaglutide Inject 3 mg as directed once a week.        Allergies: No Known Allergies  Family History: Family History  Problem Relation Age of Onset   Hypertension Father        DOB [phone]   Hyperlipidemia Father    Diabetes Father    Coronary artery disease Father        with stents   Nephrolithiasis Father    Kidney disease Father        s/p rejected kidney transplant; on HD   Memory loss Mother        DOB 70   Kidney disease Mother        s/p nephrectomy, infection problem   Dementia Mother    Cancer Brother        lung   HIV Brother    Prostate cancer Neg Hx    Colon cancer Neg Hx     Social History:  reports that he has never smoked. He has been exposed to tobacco smoke. He has never used smokeless tobacco. He reports that he does not drink alcohol and does not use drugs.  ROS: All other review of systems were reviewed and are negative except what is noted above in HPI   Laboratory Data: Lab Results  Component Value Date   WBC 4.8 05/23/2020   HGB 11.3 (L) 06/03/2020   HCT 35.3 (L) 06/03/2020   MCV 88.4 05/23/2020   PLT 243 05/23/2020    Lab Results  Component Value Date   CREATININE 1.60 (H) 01/01/2021    Lab Results  Component Value Date   PSA 11.14 (H) 11/19/2012   PSA 7.47 (H) 11/04/2011   PSA 3.43 04/23/2009    No results found for: TESTOSTERONE  Lab Results  Component Value Date   HGBA1C 7.5 (A) 12/24/2020    Urinalysis    Component Value Date/Time   COLORURINE YELLOW 03/28/2020 1805   APPEARANCEUR Cloudy (A) 07/13/2020 1432   LABSPEC 1.016 03/28/2020 1805   PHURINE 5.0 03/28/2020 1805   GLUCOSEU Negative 07/13/2020 1432   GLUCOSEU >=1000 04/23/2009 0911   HGBUR LARGE (A) 03/28/2020 1805   BILIRUBINUR Negative 07/13/2020 1432   KETONESUR NEGATIVE 03/28/2020 1805   PROTEINUR 2+ (A) 07/13/2020 1432   PROTEINUR 100 (A) 03/28/2020 1805    UROBILINOGEN negative (A) 11/04/2019 1419   UROBILINOGEN 0.2 04/23/2009 0911   NITRITE Negative 07/13/2020 1432   NITRITE NEGATIVE 03/28/2020 1805   LEUKOCYTESUR 2+ (A) 07/13/2020 1432   LEUKOCYTESUR NEGATIVE 03/28/2020 1805    Lab Results  Component Value Date   LABMICR See below: 07/13/2020   WBCUA 6-10 (A) 07/13/2020   LABEPIT 0-10 07/13/2020   MUCUS Present 07/13/2020   BACTERIA Few 07/13/2020    Pertinent Imaging:  Results for orders placed during the hospital encounter of 09/20/19  DG Abdomen  1 View  Narrative CLINICAL DATA:  Constipation and trouble breathing  EXAM: ABDOMEN - 1 VIEW  COMPARISON:  None.  FINDINGS: The bowel gas pattern is normal. No abnormal stool retention. Pelvic calcifications on the right have the appearance of phlebolith. No abnormal osseous findings.  IMPRESSION: Normal bowel gas pattern.  No abnormal stool retention.   Electronically Signed By: Monte Fantasia M.D. On: 09/20/2019 05:09  No results found for this or any previous visit.  No results found for this or any previous visit.  No results found for this or any previous visit.  No results found for this or any previous visit.  No results found for this or any previous visit.  No results found for this or any previous visit.  No results found for this or any previous visit.   Assessment & Plan:    1. BPH with urinary obstruction -Continue finasteride 5mg  daily  2. Nocturia Continue finasteride 5mg  daily  3. Gross hematuria Continue finasteride 5mg  daily   No follow-ups on file.  Nicolette Bang, MD  [facility] [place]

## 2021-02-05 ENCOUNTER — Other Ambulatory Visit: Payer: Self-pay | Admitting: Internal Medicine

## 2021-02-17 ENCOUNTER — Other Ambulatory Visit: Payer: Self-pay | Admitting: Cardiology

## 2021-02-18 NOTE — Telephone Encounter (Signed)
Eliquis 5 mg refill request received. Patient is 65 years old, weight- 148.7 kg, Crea- 1.6 on 01/01/21, Diagnosis-atrial flutter, and last seen by Dr. Radford Pax on 11/21/20. Dose is appropriate based on dosing criteria. Will send in refill to requested pharmacy.

## 2021-02-21 ENCOUNTER — Other Ambulatory Visit: Payer: Self-pay | Admitting: Internal Medicine

## 2021-03-20 ENCOUNTER — Other Ambulatory Visit: Payer: Self-pay

## 2021-03-20 ENCOUNTER — Ambulatory Visit (INDEPENDENT_AMBULATORY_CARE_PROVIDER_SITE_OTHER): Payer: BC Managed Care – PPO | Admitting: Podiatry

## 2021-03-20 DIAGNOSIS — B351 Tinea unguium: Secondary | ICD-10-CM

## 2021-03-20 DIAGNOSIS — M79674 Pain in right toe(s): Secondary | ICD-10-CM

## 2021-03-20 DIAGNOSIS — M79675 Pain in left toe(s): Secondary | ICD-10-CM

## 2021-03-20 DIAGNOSIS — E1142 Type 2 diabetes mellitus with diabetic polyneuropathy: Secondary | ICD-10-CM | POA: Diagnosis not present

## 2021-03-22 ENCOUNTER — Encounter: Payer: Self-pay | Admitting: Podiatry

## 2021-03-22 NOTE — Progress Notes (Signed)
  Subjective:  Patient ID: Johnny [name], male    DOB: November 15, 1955,  MRN: [id number]  Chief Complaint  Patient presents with   debride    DFC -FBS: 79 a1C: unknown PCP: [place] x 2 mo -BL nails trimming -toenails are sore   65 y.o. male returns for the above complaint.  Patient presents with thickened elongated dystrophic toenails x10.  Patient is a painful to walk on.  Patient would like to have them debrided down as he is not able to do it himself.  Patient is a diabetic with last A1c of 7.6.  He does not have any secondary complaints.  Objective:  There were no vitals filed for this visit. Podiatric Exam: Vascular: dorsalis pedis and posterior tibial pulses are palpable bilateral. Capillary return is immediate. Temperature gradient is WNL. Skin turgor WNL  Sensorium: Normal Semmes Weinstein monofilament test. Normal tactile sensation bilaterally. Nail Exam: Pt has thick disfigured discolored nails with subungual debris noted bilateral entire nail hallux through fifth toenails.  Pain on palpation to the nails. Ulcer Exam: There is no evidence of ulcer or pre-ulcerative changes or infection. Orthopedic Exam: Muscle tone and strength are WNL. No limitations in general ROM. No crepitus or effusions noted. HAV  B/L.  Hammer toes 2-5  B/L. Skin: No Porokeratosis. No infection or ulcers.  Bilateral severe xerosis noted to bilateral lower extremity    Assessment & Plan:   1. Type 2 diabetes mellitus with diabetic polyneuropathy, without long-term current use of insulin (HCC)   2. Pain due to onychomycosis of toenails of both feet       Patient was evaluated and treated and all questions answered.  Xerosis skin severe bilateral lower extremity -Clinically improved   Onychomycosis with pain  -Nails palliatively debrided as below. -Educated on self-care  Procedure: Nail Debridement Rationale: pain  Type of Debridement: manual, sharp debridement. Instrumentation: Nail nipper,  rotary burr. Number of Nails: 10  Procedures and Treatment: Consent by patient was obtained for treatment procedures. The patient understood the discussion of treatment and procedures well. All questions were answered thoroughly reviewed. Debridement of mycotic and hypertrophic toenails, 1 through 5 bilateral and clearing of subungual debris. No ulceration, no infection noted.  Return Visit-Office Procedure: Patient instructed to return to the office for a follow up visit 3 months for continued evaluation and treatment.  Boneta Lucks, DPM    No follow-ups on file.

## 2021-04-19 ENCOUNTER — Ambulatory Visit: Payer: BC Managed Care – PPO | Admitting: Urology

## 2021-05-25 ENCOUNTER — Other Ambulatory Visit: Payer: Self-pay | Admitting: Internal Medicine

## 2021-05-27 ENCOUNTER — Ambulatory Visit: Payer: BC Managed Care – PPO | Admitting: Urology

## 2021-05-27 ENCOUNTER — Encounter: Payer: Self-pay | Admitting: Urology

## 2021-05-27 ENCOUNTER — Other Ambulatory Visit: Payer: Self-pay

## 2021-05-27 VITALS — BP 197/94 | HR 88

## 2021-05-27 DIAGNOSIS — R310 Gross hematuria: Secondary | ICD-10-CM | POA: Diagnosis not present

## 2021-05-27 DIAGNOSIS — N138 Other obstructive and reflux uropathy: Secondary | ICD-10-CM

## 2021-05-27 DIAGNOSIS — N401 Enlarged prostate with lower urinary tract symptoms: Secondary | ICD-10-CM

## 2021-05-27 DIAGNOSIS — R351 Nocturia: Secondary | ICD-10-CM

## 2021-05-27 LAB — URINALYSIS, ROUTINE W REFLEX MICROSCOPIC
Bilirubin, UA: NEGATIVE
Glucose, UA: NEGATIVE
Ketones, UA: NEGATIVE
Nitrite, UA: NEGATIVE
RBC, UA: NEGATIVE
Specific Gravity, UA: 1.02 (ref 1.005–1.030)
Urobilinogen, Ur: 0.2 mg/dL (ref 0.2–1.0)
pH, UA: 6.5 (ref 5.0–7.5)

## 2021-05-27 LAB — MICROSCOPIC EXAMINATION
RBC, Urine: NONE SEEN /hpf (ref 0–2)
Renal Epithel, UA: NONE SEEN /hpf

## 2021-05-27 MED ORDER — FINASTERIDE 5 MG PO TABS: 5.0000 mg | ORAL_TABLET | Freq: Every day | ORAL | 3 refills | Status: DC

## 2021-05-27 NOTE — Progress Notes (Signed)
05/27/2021 10:34 AM   Johnny [name] 1956-01-26 [id number]  Referring provider: [name], MD [place],  [place] [address]  Followup BPH and nocturia, gross hematuria   HPI: Johnny [name] is a 66yo here for followup for BPH, nocturia and gross hematuria. NO hematuria since last visit. Patient remains on finasteride. IPSS 2 QOL 0. Urine stream strong. No straining to urinate. No urinary hesitancy. He wears his CPAP and now has nocturia 0-1x.    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   Qualifier: Diagnosis of  By: Linda Hedges MD, Heinz Knuckles    Sleep apnea    Type II diabetes mellitus with renal manifestations, uncontrolled 08/03/2007   Qualifier: Diagnosis of  By: Linda Hedges MD, Heinz Knuckles  Med metformin     Surgical History: Past Surgical History:  Procedure Laterality Date   CARDIOVERSION N/A 07/18/2019   Procedure: CARDIOVERSION;  Surgeon: Dorothy Spark, MD;  Location: [facility] ENDOSCOPY;  Service: Cardiovascular;  Laterality: N/A;   TEE WITHOUT CARDIOVERSION N/A 07/18/2019   Procedure: TRANSESOPHAGEAL ECHOCARDIOGRAM (TEE);  Surgeon: Dorothy Spark, MD;  Location: [facility] ENDOSCOPY;  Service: Cardiovascular;  Laterality: N/A;   TOOTH EXTRACTION     XI ROBOTIC ASSISTED SIMPLE PROSTATECTOMY N/A 05/31/2020   Procedure: XI ROBOTIC ASSISTED SIMPLE PROSTATECTOMY-SUPRAPUBIC;  Surgeon: Cleon Gustin, MD;  Location: [facility] ORS;  Service: Urology;  Laterality: N/A;     Home Medications:  Allergies as of 05/27/2021   No Known Allergies      Medication List        Accurate as of May 27, 2021 10:34 AM. If you have any questions, ask your nurse or doctor.          ammonium lactate 12 % lotion Commonly known as: AmLactin Apply 1 application topically as needed for dry skin.   atorvastatin 80 MG tablet Commonly known as: LIPITOR Take 1 tablet (80 mg total) by mouth daily.   carvedilol 25 MG tablet Commonly known as: COREG Take 1 tablet (25 mg total) by mouth 2 (two) times daily with a meal.   Eliquis 5 MG Tabs tablet Generic drug: apixaban TAKE 1 TABLET BY MOUTH TWICE A DAY   finasteride 5 MG tablet Commonly known as: PROSCAR Take 1 tablet (5 mg total) by mouth daily.   fluticasone 50 MCG/ACT nasal spray Commonly known as: FLONASE SPRAY 1 SPRAY INTO BOTH NOSTRILS DAILY.   glipiZIDE 5 MG tablet Commonly known as: GLUCOTROL TAKE 1 TABLET (5 MG TOTAL) BY MOUTH 2 (TWO) TIMES DAILY BEFORE A MEAL.   hydrALAZINE 50 MG tablet Commonly known as: APRESOLINE Take 1 tablet (50 mg total) by mouth 3 (three) times daily.   isosorbide mononitrate 60 MG 24 hr tablet Commonly known as: IMDUR Take 60 mg by mouth daily.   isosorbide-hydrALAZINE 20-37.5 MG tablet Commonly known as: BIDIL Take 3 tablets by mouth daily.   metFORMIN  500 MG 24 hr tablet Commonly known as: GLUCOPHAGE-XR Take 1 tablet (500 mg total) by mouth 2 (two) times daily.   olmesartan-hydrochlorothiazide 40-12.5 MG tablet Commonly known as: BENICAR HCT Take 1 tablet by mouth daily.   Trulicity 3 ES/9.2ZR Sopn Generic drug: Dulaglutide Inject 3 mg as directed once a week.        Allergies: No Known Allergies  Family History: Family History  Problem Relation Age of Onset   Hypertension Father        DOB [phone]   Hyperlipidemia Father    Diabetes Father    Coronary artery disease Father        with stents   Nephrolithiasis Father    Kidney disease  Father        s/p rejected kidney transplant; on HD   Memory loss Mother        DOB 70   Kidney disease Mother        s/p nephrectomy, infection problem   Dementia Mother    Cancer Brother        lung   HIV Brother    Prostate cancer Neg Hx    Colon cancer Neg Hx     Social History:  reports that he has never smoked. He has been exposed to tobacco smoke. He has never used smokeless tobacco. He reports that he does not drink alcohol and does not use drugs.  ROS: All other review of systems were reviewed and are negative except what is noted above in HPI  Physical Exam: BP (!) 197/94    Pulse 88   Constitutional:  Alert and oriented, No acute distress. HEENT: [place] AT, moist mucus membranes.  Trachea midline, no masses. Cardiovascular: No clubbing, cyanosis, or edema. Respiratory: Normal respiratory effort, no increased work of breathing. GI: Abdomen is soft, nontender, nondistended, no abdominal masses GU: No CVA tenderness.  Lymph: No cervical or inguinal lymphadenopathy. Skin: No rashes, bruises or suspicious lesions. Neurologic: Grossly intact, no focal deficits, moving all 4 extremities. Psychiatric: Normal mood and affect.  Laboratory Data: Lab Results  Component Value Date   WBC 4.8 05/23/2020   HGB 11.3 (L) 06/03/2020   HCT 35.3 (L) 06/03/2020   MCV 88.4 05/23/2020   PLT 243 05/23/2020    Lab Results  Component Value Date   CREATININE 1.60 (H) 01/01/2021    Lab Results  Component Value Date   PSA 11.14 (H) 11/19/2012   PSA 7.47 (H) 11/04/2011   PSA 3.43 04/23/2009    No results found for: TESTOSTERONE  Lab Results  Component Value Date   HGBA1C 7.5 (A) 12/24/2020    Urinalysis    Component Value Date/Time   COLORURINE YELLOW 03/28/2020 1805   APPEARANCEUR Cloudy (A) 07/13/2020 1432   LABSPEC 1.016 03/28/2020 1805   PHURINE 5.0 03/28/2020 1805   GLUCOSEU Negative 07/13/2020 1432   GLUCOSEU >=1000 04/23/2009 0911   HGBUR LARGE (A) 03/28/2020  1805   BILIRUBINUR Negative 07/13/2020 1432   [place] 03/28/2020 1805   PROTEINUR 2+ (A) 07/13/2020 1432   PROTEINUR 100 (A) 03/28/2020 1805   UROBILINOGEN negative (A) 11/04/2019 1419   UROBILINOGEN 0.2 04/23/2009 0911   NITRITE Negative 07/13/2020 1432   NITRITE NEGATIVE 03/28/2020 1805   LEUKOCYTESUR 2+ (A) 07/13/2020 1432   LEUKOCYTESUR NEGATIVE 03/28/2020 1805    Lab Results  Component Value Date   LABMICR See below: 07/13/2020   WBCUA 6-10 (A) 07/13/2020   LABEPIT 0-10 07/13/2020   MUCUS Present 07/13/2020  BACTERIA Few 07/13/2020    Pertinent Imaging:  Results for orders placed during the hospital encounter of 09/20/19  DG Abdomen 1 View  Narrative CLINICAL DATA:  Constipation and trouble breathing  EXAM: ABDOMEN - 1 VIEW  COMPARISON:  None.  FINDINGS: The bowel gas pattern is normal. No abnormal stool retention. Pelvic calcifications on the right have the appearance of phlebolith. No abnormal osseous findings.  IMPRESSION: Normal bowel gas pattern.  No abnormal stool retention.   Electronically Signed By: Monte Fantasia M.D. On: 09/20/2019 05:09  No results found for this or any previous visit.  No results found for this or any previous visit.  No results found for this or any previous visit.  No results found for this or any previous visit.  No results found for this or any previous visit.  No results found for this or any previous visit.  No results found for this or any previous visit.   Assessment & Plan:    1. Nocturia -Continue CPAP - Urinalysis, Routine w reflex microscopic  2. BPH with urinary obstruction -continue finasteride 5mg  daily - Urinalysis, Routine w reflex microscopic  3. Gross hematuria -resolved -continue finasteride   No follow-ups on file.  Nicolette Bang, MD  [facility] [place]

## 2021-05-27 NOTE — Progress Notes (Signed)
Urological Symptom Review  Patient is experiencing the following symptoms: none   Review of Systems  Gastrointestinal (upper)  : Negative for upper GI symptoms  Gastrointestinal (lower) : Negative for lower GI symptoms  Constitutional : Negative for symptoms  Skin: Skin rash/lesion  Eyes: Negative for eye symptoms  Ear/Nose/Throat : Negative for Ear/Nose/Throat symptoms  Hematologic/Lymphatic: Negative for Hematologic/Lymphatic symptoms  Cardiovascular : Negative for cardiovascular symptoms  Respiratory : Negative for respiratory symptoms  Endocrine: Negative for endocrine symptoms  Musculoskeletal: Negative for musculoskeletal symptoms  Neurological: Negative for neurological symptoms  Psychologic: Negative for psychiatric symptoms

## 2021-05-28 NOTE — Telephone Encounter (Signed)
We can refill if needed but his cardiologist should prescribe this long term.

## 2021-06-03 ENCOUNTER — Other Ambulatory Visit: Payer: Self-pay | Admitting: Internal Medicine

## 2021-06-21 ENCOUNTER — Other Ambulatory Visit: Payer: Self-pay

## 2021-06-21 ENCOUNTER — Ambulatory Visit (INDEPENDENT_AMBULATORY_CARE_PROVIDER_SITE_OTHER): Payer: BC Managed Care – PPO | Admitting: Podiatry

## 2021-06-21 DIAGNOSIS — M79675 Pain in left toe(s): Secondary | ICD-10-CM

## 2021-06-21 DIAGNOSIS — M79674 Pain in right toe(s): Secondary | ICD-10-CM | POA: Diagnosis not present

## 2021-06-21 DIAGNOSIS — B351 Tinea unguium: Secondary | ICD-10-CM | POA: Diagnosis not present

## 2021-06-21 DIAGNOSIS — E1142 Type 2 diabetes mellitus with diabetic polyneuropathy: Secondary | ICD-10-CM

## 2021-06-23 ENCOUNTER — Other Ambulatory Visit: Payer: Self-pay | Admitting: Internal Medicine

## 2021-06-25 ENCOUNTER — Encounter: Payer: Self-pay | Admitting: Podiatry

## 2021-06-25 NOTE — Progress Notes (Signed)
°  Subjective:  Patient ID: Johnny [name], male    DOB: 05/14/1955,  MRN: [id number]  Chief Complaint  Patient presents with   Nail Problem    Nail trim    66 y.o. male returns for the above complaint.  Patient presents with thickened elongated dystrophic toenails x10.  Patient is a painful to walk on.  Patient would like to have them debrided down as he is not able to do it himself.  Patient is a diabetic with last A1c of 7.6.  He does not have any secondary complaints.  Objective:  There were no vitals filed for this visit. Podiatric Exam: Vascular: dorsalis pedis and posterior tibial pulses are palpable bilateral. Capillary return is immediate. Temperature gradient is WNL. Skin turgor WNL  Sensorium: Normal Semmes Weinstein monofilament test. Normal tactile sensation bilaterally. Nail Exam: Pt has thick disfigured discolored nails with subungual debris noted bilateral entire nail hallux through fifth toenails.  Pain on palpation to the nails. Ulcer Exam: There is no evidence of ulcer or pre-ulcerative changes or infection. Orthopedic Exam: Muscle tone and strength are WNL. No limitations in general ROM. No crepitus or effusions noted. HAV  B/L.  Hammer toes 2-5  B/L. Skin: No Porokeratosis. No infection or ulcers.  Bilateral severe xerosis noted to bilateral lower extremity    Assessment & Plan:   1. Pain due to onychomycosis of toenails of both feet   2. Type 2 diabetes mellitus with diabetic polyneuropathy, without long-term current use of insulin (HCC)         Patient was evaluated and treated and all questions answered.  Xerosis skin severe bilateral lower extremity -Clinically improved   Onychomycosis with pain  -Nails palliatively debrided as below. -Educated on self-care  Procedure: Nail Debridement Rationale: pain  Type of Debridement: manual, sharp debridement. Instrumentation: Nail nipper, rotary burr. Number of Nails: 10  Procedures and Treatment:  Consent by patient was obtained for treatment procedures. The patient understood the discussion of treatment and procedures well. All questions were answered thoroughly reviewed. Debridement of mycotic and hypertrophic toenails, 1 through 5 bilateral and clearing of subungual debris. No ulceration, no infection noted.  Return Visit-Office Procedure: Patient instructed to return to the office for a follow up visit 3 months for continued evaluation and treatment.  Jourden Gilson, DPM    No follow-ups on file. 

## 2021-06-28 ENCOUNTER — Ambulatory Visit: Payer: BC Managed Care – PPO | Admitting: Internal Medicine

## 2021-06-28 ENCOUNTER — Other Ambulatory Visit: Payer: Self-pay

## 2021-06-28 ENCOUNTER — Other Ambulatory Visit: Payer: Self-pay | Admitting: Cardiology

## 2021-06-28 ENCOUNTER — Encounter: Payer: Self-pay | Admitting: Internal Medicine

## 2021-06-28 VITALS — BP 140/80 | HR 78 | Ht 77.0 in | Wt 314.6 lb

## 2021-06-28 DIAGNOSIS — E118 Type 2 diabetes mellitus with unspecified complications: Secondary | ICD-10-CM

## 2021-06-28 DIAGNOSIS — E1142 Type 2 diabetes mellitus with diabetic polyneuropathy: Secondary | ICD-10-CM

## 2021-06-28 DIAGNOSIS — N1831 Chronic kidney disease, stage 3a: Secondary | ICD-10-CM

## 2021-06-28 DIAGNOSIS — E1122 Type 2 diabetes mellitus with diabetic chronic kidney disease: Secondary | ICD-10-CM

## 2021-06-28 DIAGNOSIS — D3501 Benign neoplasm of right adrenal gland: Secondary | ICD-10-CM

## 2021-06-28 DIAGNOSIS — E1165 Type 2 diabetes mellitus with hyperglycemia: Secondary | ICD-10-CM | POA: Diagnosis not present

## 2021-06-28 DIAGNOSIS — D3502 Benign neoplasm of left adrenal gland: Secondary | ICD-10-CM

## 2021-06-28 LAB — POCT GLYCOSYLATED HEMOGLOBIN (HGB A1C): Hemoglobin A1C: 7.5 % — AB (ref 4.0–5.6)

## 2021-06-28 MED ORDER — GLIPIZIDE 5 MG PO TABS: 5.0000 mg | ORAL_TABLET | Freq: Every day | ORAL | 3 refills | Status: DC

## 2021-06-28 MED ORDER — TRULICITY 3 MG/0.5ML ~~LOC~~ SOAJ: 3.0000 mg | SUBCUTANEOUS | 3 refills | Status: DC

## 2021-06-28 NOTE — Patient Instructions (Signed)
°-   Continue Metformin 500 mg 1 tablet with Breakfast and 1 tablet with supper - Continue  Glipizide 5 mg, 1 tablet before breakfast  - Continue Trulicity  3 mg once a week        - HOW TO TREAT LOW BLOOD SUGARS (Blood sugar LESS THAN 70 MG/DL) Please follow the RULE OF 15 for the treatment of hypoglycemia treatment (when your (blood sugars are less than 70 mg/dL)   STEP 1: Take 15 grams of carbohydrates when your blood sugar is low, which includes:  3-4 GLUCOSE TABS  OR 3-4 OZ OF JUICE OR REGULAR SODA OR ONE TUBE OF GLUCOSE GEL    STEP 2: RECHECK blood sugar in 15 MINUTES STEP 3: If your blood sugar is still low at the 15 minute recheck --> then, go back to STEP 1 and treat AGAIN with another 15 grams of carbohydrates.      24-Hour Urine Collection  You will be collecting your urine for a 24-hour period of time. Your timer starts with your first urine of the morning (For example - If you first pee at [place], your timer will start at [place]) [place] away your first urine of the morning Collect your urine every time you pee for the next 24 hours STOP your urine collection 24 hours after you started the collection (For example - You would stop at 9AM the day after you started)

## 2021-06-28 NOTE — Progress Notes (Signed)
Name: Johnny [name]  Age/ Sex: 66 y.o., male   MRN/ DOB: [id number], 1956/02/24     PCP: [name], MD   Reason for Endocrinology Evaluation: Type 2 Diabetes Mellitus  Initial Endocrine Consultative Visit: 08/20/2018    PATIENT IDENTIFIER: Johnny [name] is a 66 y.o. male with a past medical history of T2DM, psoriasis, HTN and OSA (Dx 08/2019) . The patient has followed with Endocrinology clinic since 08/20/2018 for consultative assistance with management of his diabetes.     DIABETIC HISTORY:  Johnny [name] was diagnosed with T2DM in 2012,. He was on Metformin,Farxiga and Glimepiride in the past. He has never been on insulin. His hemoglobin A1c has ranged from 9.0 % in 2013, peaking at 13.5% in 06/2018.  On his initial visit to our clinic his A1c was 13.5%. He was not taking any of his meds. He was restarted on Metformin and glipizide.   Trulicity was started 32/1224    ADRENAL HISTORY: Upon review of his chart he was noted to have bilateral adrenal nodules on CT abdomen and pelvis from 03/2020.  Right adrenal nodule 3.3 cm and left adrenal nodule 2.8 cm  He did have normal renin and Aldo during evaluation for uncontrolled HTN in 09/2018   SUBJECTIVE:   During the last visit (12/24/2020): A1c 7.5 %.  we continued metformin and Glipizide and increased Trulicity   Today (12/28/35): Johnny [name] is here for a  follow up on his diabetes.  He checks his blood sugars 2 times daily, preprandial to breakfast and supper. The patient has not hypoglycemic episodes since the last clinic visit.   Denies nausea and diarrhea  Has been noted with weight loss   Follows with Podiatry - Dr. Posey Pronto   He has been having issues obtaining Trulicity due to shortage    HOME DIABETES REGIMEN:  Metformin 500 mg XR 1 tablets BID with meals Glipizide 5 mg,  1 tablet daily  Trulicity 3 mg weekly ( Saturday )     METER DOWNLOAD SUMMARY: 2/11-2/24/2023  Average Number Tests/Day =  11 Overall Mean FS Glucose = 90 Standard Deviation = 12  BG Ranges: Low = 73 High = 119   Hypoglycemic Events/30 Days: BG < 50 = 0 Episodes of symptomatic severe hypoglycemia = 0     DIABETIC COMPLICATIONS: Microvascular complications:  Neuropathy, CKD Denies: retinopathy  Last eye exam: Completed 05/2020   Macrovascular complications:    Denies: CAD, PVD, CVA     HISTORY:  Past Medical History:  Past Medical History:  Diagnosis Date   CKD (chronic kidney disease) stage 3, GFR 30-59 ml/min ([place]) 06/10/2017   Constipation 03/27/2020   Diabetes mellitus    Dysrhythmia    A flutter   Essential hypertension 08/03/2007   Qualifier: Diagnosis of  By: Linda Hedges MD, Heinz Knuckles  Med ARB, diuretic     Guttate psoriasis    Hyperlipidemia associated with type 2 diabetes mellitus ([place]) 08/03/2007   Qualifier: Diagnosis of  By: Linda Hedges MD, Heinz Knuckles  meds - none    Paroxysmal atrial flutter ([place])    S/P TEE/DCCV 07/2019   PSORIASIS, 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   Qualifier: Diagnosis of  By: Linda Hedges MD, Heinz Knuckles    Sleep apnea    Type II diabetes mellitus with renal manifestations, uncontrolled 08/03/2007   Qualifier: Diagnosis of  By: Linda Hedges MD, Heinz Knuckles  Med metformin    Past Surgical History:  Past Surgical History:  Procedure Laterality Date  CARDIOVERSION N/A 07/18/2019   Procedure: CARDIOVERSION;  Surgeon: Dorothy Spark, MD;  Location: [facility] ENDOSCOPY;  Service: Cardiovascular;  Laterality: N/A;   TEE WITHOUT CARDIOVERSION N/A 07/18/2019   Procedure: TRANSESOPHAGEAL ECHOCARDIOGRAM (TEE);  Surgeon: Dorothy Spark, MD;  Location: [facility] ENDOSCOPY;  Service: Cardiovascular;  Laterality: N/A;   TOOTH EXTRACTION     XI ROBOTIC ASSISTED SIMPLE PROSTATECTOMY N/A 05/31/2020   Procedure: XI ROBOTIC ASSISTED SIMPLE PROSTATECTOMY-SUPRAPUBIC;  Surgeon: Cleon Gustin, MD;  Location: [facility] ORS;  Service: Urology;  Laterality: N/A;   Social History:  reports that he has never smoked. He has been  exposed to tobacco smoke. He has never used smokeless tobacco. He reports that he does not drink alcohol and does not use drugs. Family History:  Family History  Problem Relation Age of Onset   Hypertension Father        DOB [phone]   Hyperlipidemia Father    Diabetes Father    Coronary artery disease Father        with stents   Nephrolithiasis Father    Kidney disease Father        s/p rejected kidney transplant; on HD   Memory loss Mother        DOB 62   Kidney disease Mother        s/p nephrectomy, infection problem   Dementia Mother    Cancer Brother        lung   HIV Brother    Prostate cancer Neg Hx    Colon cancer Neg Hx      HOME MEDICATIONS: Allergies as of 06/28/2021   No Known Allergies      Medication List        Accurate as of June 28, 2021  3:24 PM. If you have any questions, ask your nurse or doctor.          ammonium lactate 12 % lotion Commonly known as: AmLactin Apply 1 application topically as needed for dry skin.   atorvastatin 80 MG tablet Commonly known as: LIPITOR TAKE 1 TABLET BY MOUTH EVERY DAY   carvedilol 25 MG tablet Commonly known as: COREG Take 1 tablet (25 mg total) by mouth 2 (two) times daily with a meal.   Eliquis 5 MG Tabs tablet Generic drug: apixaban TAKE 1 TABLET BY MOUTH TWICE A DAY   finasteride 5 MG tablet Commonly known as: PROSCAR Take 1 tablet (5 mg total) by mouth daily.   fluticasone 50 MCG/ACT nasal spray Commonly known as: FLONASE SPRAY 1 SPRAY INTO BOTH NOSTRILS DAILY.   glipiZIDE 5 MG tablet Commonly known as: GLUCOTROL TAKE 1 TABLET (5 MG TOTAL) BY MOUTH 2 (TWO) TIMES DAILY BEFORE A MEAL.   hydrALAZINE 50 MG tablet Commonly known as: APRESOLINE TAKE 1 TABLET BY MOUTH THREE TIMES A DAY   isosorbide mononitrate 60 MG 24 hr tablet Commonly known as: IMDUR Take 60 mg by mouth daily.   isosorbide mononitrate 60 MG 24 hr tablet Commonly known as: IMDUR TAKE 1 TABLET BY MOUTH EVERY DAY    isosorbide-hydrALAZINE 20-37.5 MG tablet Commonly known as: BIDIL Take 3 tablets by mouth daily.   metFORMIN 500 MG 24 hr tablet Commonly known as: GLUCOPHAGE-XR Take 1 tablet (500 mg total) by mouth 2 (two) times daily.   olmesartan-hydrochlorothiazide 40-12.5 MG tablet Commonly known as: BENICAR HCT Take 1 tablet by mouth daily.   Trulicity 3 OZ/3.0QM Sopn Generic drug: Dulaglutide Inject 3 mg as directed once a week.  OBJECTIVE:   Vital Signs: BP 140/80 (BP Location: Left Arm, Patient Position: Sitting, Cuff Size: Large)    Pulse 78    Ht 6\' 5"  (1.956 m)    Wt (!) 314 lb 9.6 oz (142.7 kg)    SpO2 97%    BMI 37.31 kg/m   Wt Readings from Last 3 Encounters:  06/28/21 (!) 314 lb 9.6 oz (142.7 kg)  01/01/21 (!) 327 lb 12.8 oz (148.7 kg)  12/24/20 (!) 325 lb 6.4 oz (147.6 kg)     Exam: General: Pt appears well and is in NAD  Lungs: Clear with good BS bilat with no rales, rhonchi, or wheezes  Heart: RRR with normal S1 and S2 and no gallops; no murmurs; no rub  Extremities: Trace pretibial edema.  Skin: Normal texture and temperature to palpation. No rash noted.  Neuro: MS is good with appropriate affect, pt is alert and Ox3    DM foot exam: 06/2021 per podiatry    DATA REVIEWED:  Lab Results  Component Value Date   HGBA1C 7.5 (A) 06/28/2021   HGBA1C 7.5 (A) 12/24/2020   HGBA1C 7.6 (A) 06/08/2020   CT abdomen 03/27/2020  FINDINGS: Lower chest: Lung bases demonstrate no acute consolidation or effusion. Hazy right subpleural density probably reflects atelectasis.   Hepatobiliary: No focal liver abnormality is seen. No gallstones, gallbladder wall thickening, or biliary dilatation.   Pancreas: Unremarkable. No pancreatic ductal dilatation or surrounding inflammatory changes.   Spleen: Normal in size without focal abnormality.   Adrenals/Urinary Tract: Nodular thickening of adrenal glands. 3.3 cm hypodense right adrenal nodule. 2.8 cm hypodense  left adrenal nodule. Kidneys show mild renal pelvis enlargement and hydroureter but no stones. Moderate perinephric edema and fluid, nonspecific. Bladder is decompressed by Foley catheter.   Stomach/Bowel: The stomach is nonenlarged. No dilated small bowel. Negative appendix. Large amount of stool in the colon without wall thickening.   Vascular/Lymphatic: Moderate aortic atherosclerosis. No aneurysm. No suspicious nodes.   Reproductive: Markedly enlarged prostate gland.   Other: No free air. Bilateral small inguinal hernias containing fat on the left and fat and small fluid on the right.   Musculoskeletal: No acute or significant osseous findings.    ASSESSMENT / PLAN / RECOMMENDATIONS:   1) Type 2 Diabetes Mellitus, Sub- Optimally controlled, With CKD III and neuropathic  complications - Most recent A1c of 7.5 %. Goal A1c < 7.0 %.   - His A1c is stable but remains above goal. -We also discussed add-on therapy with SGLT2 inhibitors, we have opted to wait until adrenal work-up is back -He did have some interruption of Trulicity due to shortage and supplies -No changes today -BMP shows stable GFR   MEDICATIONS: Continue Metformin 500 mg BID  Continue glipizide  5 mg, 1 Tablet before Breakfast  Continue Trulicity to 3.0  mg weekly   EDUCATION / INSTRUCTIONS: BG monitoring instructions: Patient is instructed to check his blood sugars 2 times a day, fasting and bedtime. Call [facility] if: BG persistently < 70  I reviewed the Rule of 15 for the treatment of hypoglycemia in detail with the patient. Literature supplied.   2.  Bilateral adrenal nodules:   -We will proceed with 24-hour urine collection for Cushing syndrome and pheochromocytoma screening. -We will check Aldo and renin   3.Dyslipidemia:   -Lipid panel at goal Medication  Atorvastatin 80 mg daily     F/U in 6 months    Signed electronically by: Mack Guise,  MD  [facility]  [facility] [address] [place], [place] [address] Phone: [phone] FAX: [phone]   CC: [name], [place] [address] Phone: [phone]  Fax: [phone]  Return to Endocrinology clinic as below: Future Appointments  Date Time Provider [place]  09/25/2021  3:45 PM Felipa Furnace, [place] [facility]  05/28/2022  2:45 PM McKenzie, Candee Furbish, [place] [facility] None

## 2021-07-01 ENCOUNTER — Encounter: Payer: Self-pay | Admitting: Internal Medicine

## 2021-07-01 ENCOUNTER — Other Ambulatory Visit: Payer: BC Managed Care – PPO

## 2021-07-01 ENCOUNTER — Other Ambulatory Visit: Payer: Self-pay

## 2021-07-01 DIAGNOSIS — D3501 Benign neoplasm of right adrenal gland: Secondary | ICD-10-CM

## 2021-07-01 DIAGNOSIS — D3502 Benign neoplasm of left adrenal gland: Secondary | ICD-10-CM

## 2021-07-01 MED ORDER — METFORMIN HCL ER 500 MG PO TB24: 500.0000 mg | ORAL_TABLET | Freq: Two times a day (BID) | ORAL | 3 refills | Status: DC

## 2021-07-01 NOTE — Progress Notes (Unsigned)
Total volume 2700.  Started 06-29-2021 7:00 pm, ended 06-30-2021 7:00 pm.

## 2021-07-02 LAB — CREATININE, URINE, 24 HOUR
Creatinine, 24H Ur: 2292 mg/24 hr — ABNORMAL HIGH (ref 1000–2000)
Creatinine, Urine: 84.9 mg/dL

## 2021-07-10 ENCOUNTER — Other Ambulatory Visit: Payer: Self-pay

## 2021-07-10 LAB — MICROALBUMIN / CREATININE URINE RATIO
Creatinine, Urine: 62.2 mg/dL
Microalb/Creat Ratio: 86 mg/g creat — ABNORMAL HIGH (ref 0–29)
Microalbumin, Urine: 53.6 ug/mL

## 2021-07-10 LAB — BASIC METABOLIC PANEL
BUN/Creatinine Ratio: 12 (ref 10–24)
BUN: 20 mg/dL (ref 8–27)
CO2: 23 mmol/L (ref 20–29)
Calcium: 9.5 mg/dL (ref 8.6–10.2)
Chloride: 107 mmol/L — ABNORMAL HIGH (ref 96–106)
Creatinine, Ser: 1.61 mg/dL — ABNORMAL HIGH (ref 0.76–1.27)
Glucose: 99 mg/dL (ref 70–99)
Potassium: 4.6 mmol/L (ref 3.5–5.2)
Sodium: 144 mmol/L (ref 134–144)
eGFR: 48 mL/min/{1.73_m2} — ABNORMAL LOW (ref >59)

## 2021-07-10 LAB — LIPID PANEL
Chol/HDL Ratio: 3.4 ratio (ref 0.0–5.0)
Cholesterol, Total: 117 mg/dL (ref 100–199)
HDL: 34 mg/dL — ABNORMAL LOW (ref >39)
LDL Chol Calc (NIH): 67 mg/dL (ref 0–99)
Triglycerides: 78 mg/dL (ref 0–149)
VLDL Cholesterol Cal: 16 mg/dL (ref 5–40)

## 2021-07-10 LAB — ALDOSTERONE + RENIN ACTIVITY W/ RATIO
ALDOS/RENIN RATIO: 3.1 (ref 0.0–30.0)
ALDOSTERONE: 3.9 ng/dL (ref 0.0–30.0)
Renin: 1.242 ng/mL/hr (ref 0.167–5.380)

## 2021-07-15 ENCOUNTER — Other Ambulatory Visit: Payer: Self-pay

## 2021-07-15 ENCOUNTER — Encounter: Payer: Self-pay | Admitting: Internal Medicine

## 2021-07-15 LAB — CORTISOL, URINE, FREE
Cortisol (Ur), Free: 30 ug/24 hr (ref 5–64)
Cortisol,F,ug/L,U: 11 ug/L

## 2021-07-15 LAB — METANEPHRINES, URINE, 24 HOUR
Metaneph Total, Ur: 47 ug/L
Metanephrines, 24H Ur: 127 ug/24 hr (ref 58–276)
Normetanephrine, 24H Ur: 351 ug/24 hr (ref 156–729)
Normetanephrine, Ur: 130 ug/L

## 2021-07-30 ENCOUNTER — Other Ambulatory Visit: Payer: Self-pay | Admitting: Cardiology

## 2021-09-22 IMAGING — RF DG CYSTOGRAM 3+V
14 of 15 series · 14 of 15 positions shown · non-contrast
Comparison: CT scan of March 27, 2020.

CLINICAL DATA: Status post prostatectomy.

EXAM:
CYSTOGRAM
TECHNIQUE: Through pre-existing Foley catheter, the bladder was filled with 200
mL Cysto-Hypaque 30% by drip infusion. Serial spot images were
obtained during bladder filling and post draining.
FLUOROSCOPY TIME:  Radiation Exposure Index (if provided by the
fluoroscopic device): 122.8 mGy

[Series 1: fluoro_myelogram_singleshot_bw · 0.17mm/px · 1 of 1 slices shown (1 of 14)]
[im 1/1]
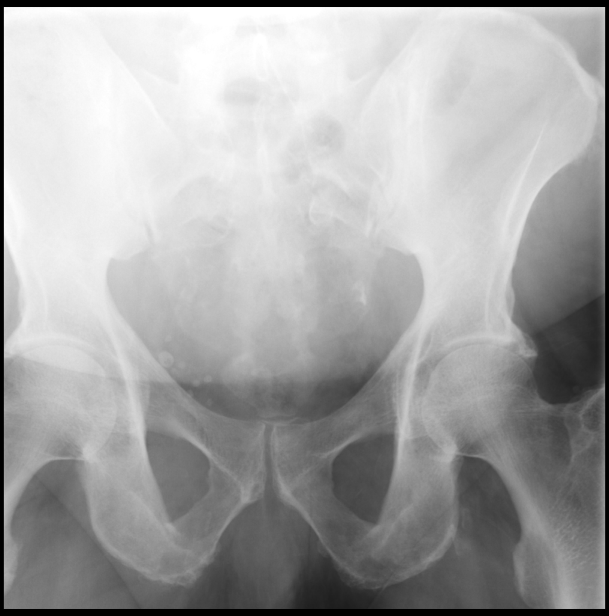

[Series 2: fluoro_myelogram_singleshot_bw · 0.17mm/px · 1 of 1 slices shown (2 of 14)]
[im 1/1]
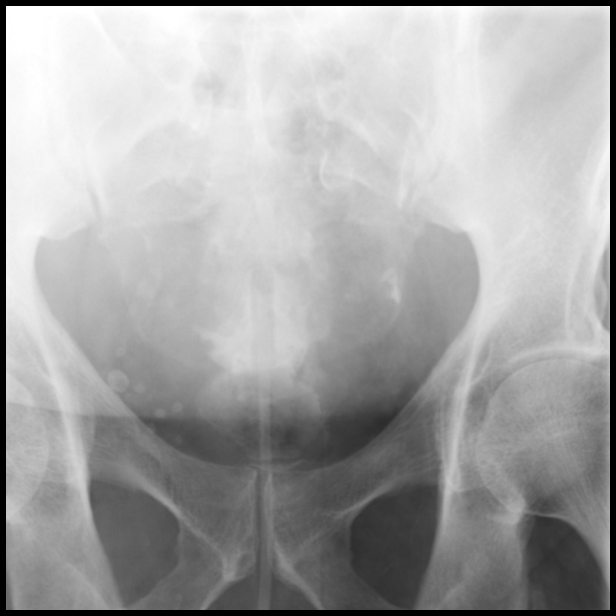

[Series 3: fluoro_myelogram_singleshot_bw · 0.17mm/px · 1 of 1 slices shown (3 of 14)]
[im 1/1]
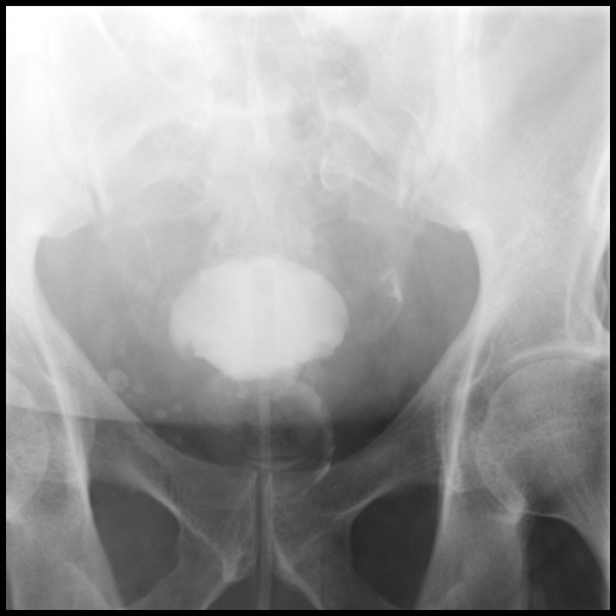

[Series 4: fluoro_myelogram_singleshot_bw · 0.17mm/px · 1 of 1 slices shown (4 of 14)]
[im 1/1]
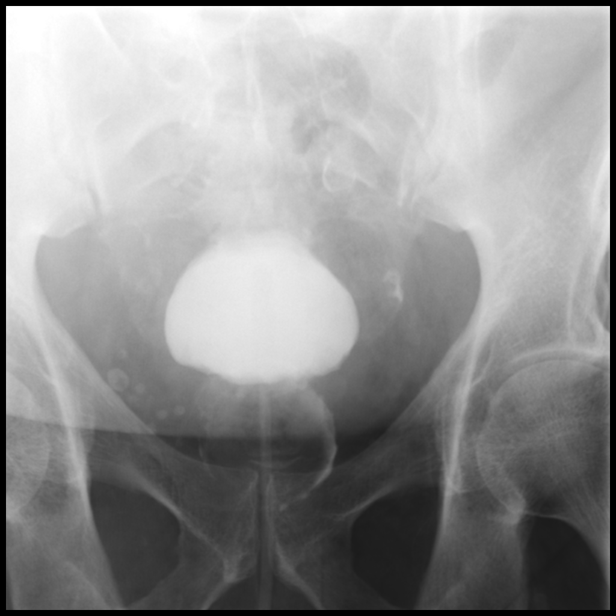

[Series 5: fluoro_myelogram_singleshot_bw · 0.17mm/px · 1 of 1 slices shown (5 of 14)]
[im 1/1]
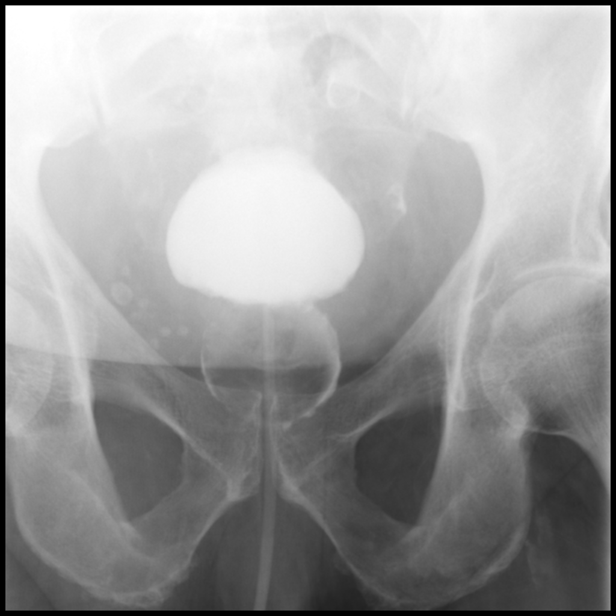

[Series 6: fluoro_myelogram_singleshot_bw · 0.17mm/px · 1 of 1 slices shown (6 of 14)]
[im 1/1]
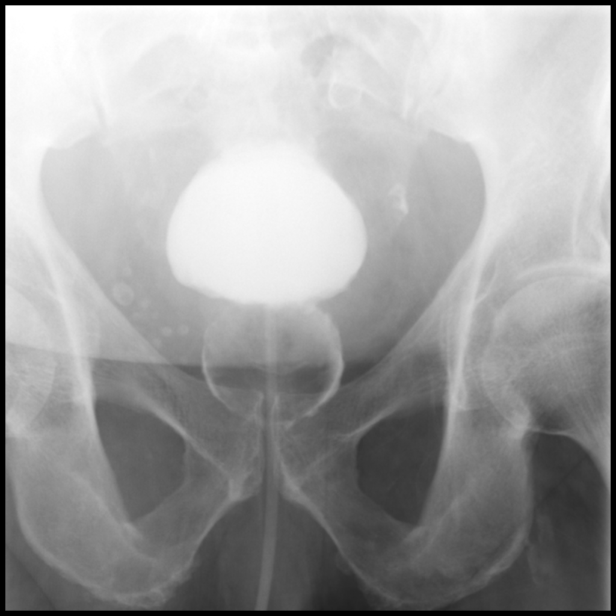

[Series 7: fluoro_myelogram_singleshot_bw · 0.17mm/px · 1 of 1 slices shown (7 of 14)]
[im 1/1]
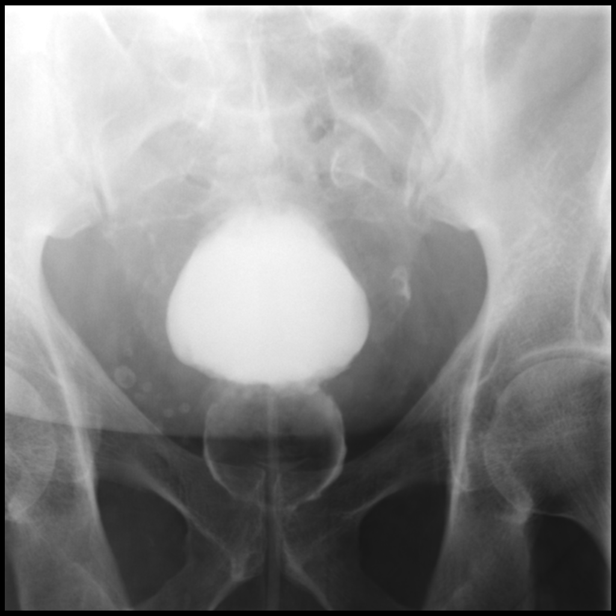

[Series 9: fluoro_myelogram_singleshot_bw · 0.17mm/px · 1 of 1 slices shown (8 of 14)]
[im 1/1]
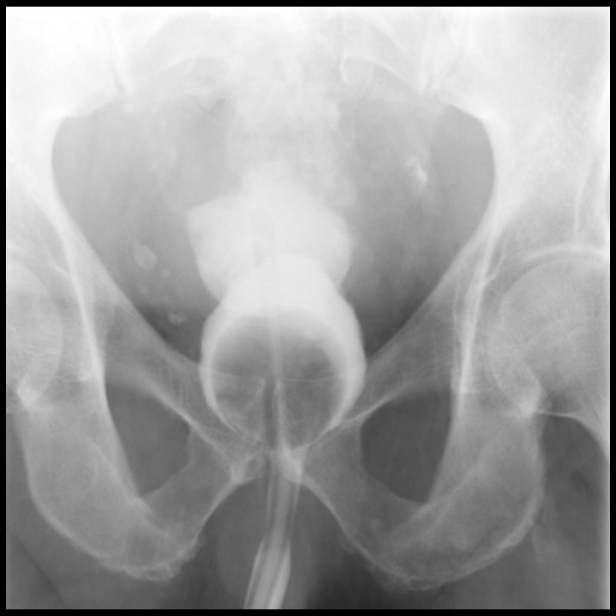

[Series 10: fluoro_myelogram_singleshot_bw · 0.17mm/px · 1 of 1 slices shown (9 of 14)]
[im 1/1]
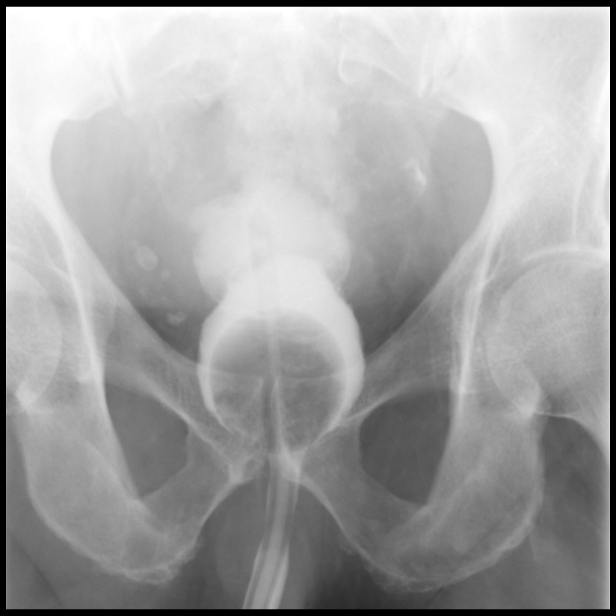

[Series 11: fluoro_myelogram_singleshot_bw · 0.17mm/px · 1 of 1 slices shown (10 of 14)]
[im 1/1]
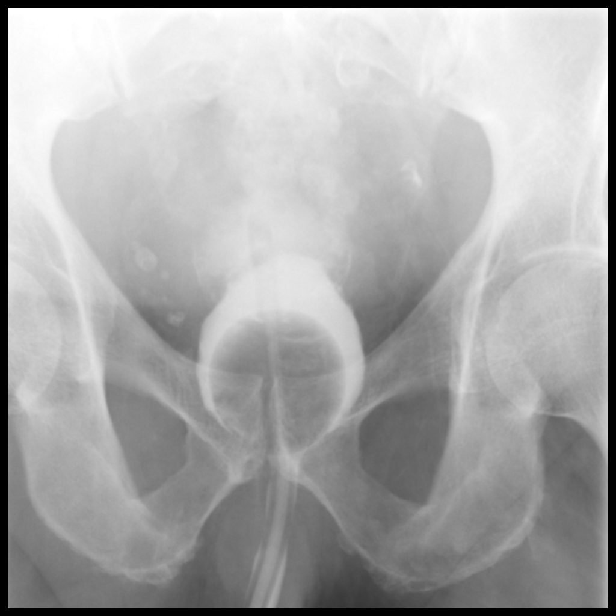

[Series 12: fluoro_myelogram_singleshot_bw · 0.17mm/px · 1 of 1 slices shown (11 of 14)]
[im 1/1]
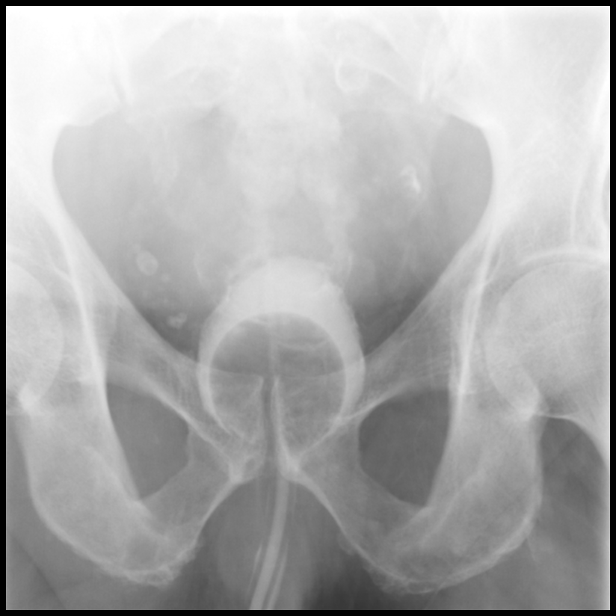

[Series 13: fluoro_myelogram_singleshot_bw · 0.17mm/px · 1 of 1 slices shown (12 of 14)]
[im 1/1]
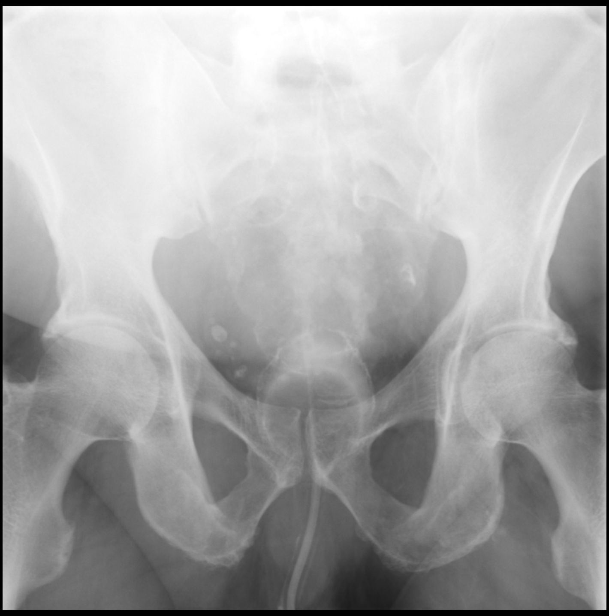

[Series 14: fluoro_myelogram_singleshot_bw · 0.17mm/px · 1 of 1 slices shown (13 of 14)]
[im 1/1]
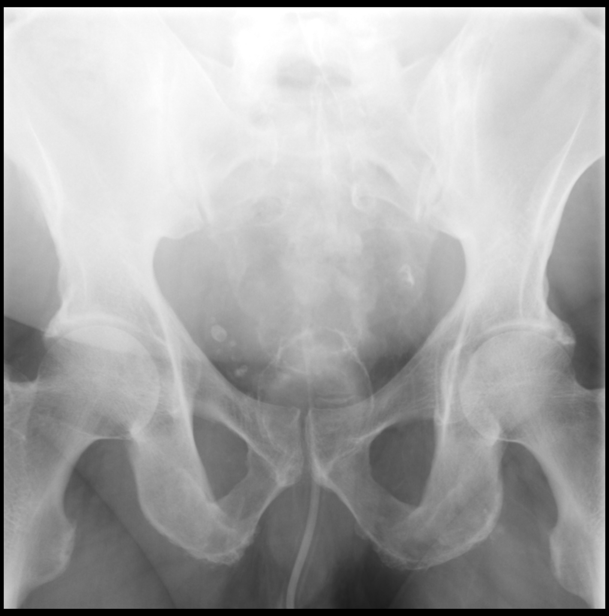

[Series 15: fluoro_myelogram_singleshot_bw · 0.17mm/px · 1 of 1 slices shown (14 of 14)]
[im 1/1]
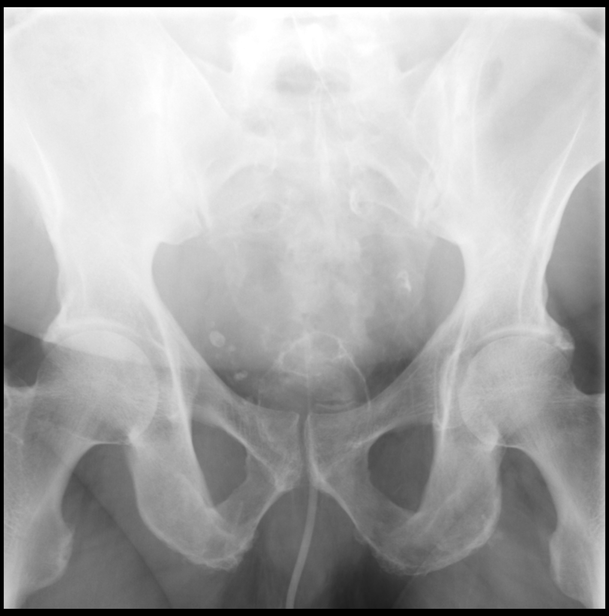

[14 of 15 positions shown; findings below may reference images not displayed]

FINDINGS: The balloon of the catheter appears to be within the prostatic
urethra. Normal filling of the bladder lumen is noted. The patient
began to void and only a minimal amount of residual contrast was
noted on postvoid imaging. No extravasation or leakage of contrast
is noted.
IMPRESSION: No extravasation or leakage of contrast is noted. Balloon portion of
urinary catheter appears to be within the prosthetic urethra.

## 2021-09-25 ENCOUNTER — Ambulatory Visit (INDEPENDENT_AMBULATORY_CARE_PROVIDER_SITE_OTHER): Payer: BC Managed Care – PPO | Admitting: Podiatry

## 2021-09-25 DIAGNOSIS — M79674 Pain in right toe(s): Secondary | ICD-10-CM | POA: Diagnosis not present

## 2021-09-25 DIAGNOSIS — M79675 Pain in left toe(s): Secondary | ICD-10-CM

## 2021-09-25 DIAGNOSIS — B351 Tinea unguium: Secondary | ICD-10-CM

## 2021-09-25 DIAGNOSIS — E1142 Type 2 diabetes mellitus with diabetic polyneuropathy: Secondary | ICD-10-CM

## 2021-09-26 ENCOUNTER — Encounter: Payer: Self-pay | Admitting: Podiatry

## 2021-09-26 NOTE — Progress Notes (Signed)
  Subjective:  Patient ID: Johnny [name], male    DOB: December 28, 1955,  MRN: [id number]  Chief Complaint  Patient presents with   Nail Problem    Nail trim    66 y.o. male returns for the above complaint.  Patient presents with thickened elongated dystrophic toenails x10.  Patient is a painful to walk on.  Patient would like to have them debrided down as he is not able to do it himself.  Patient is a diabetic with last A1c of 7.6.  He does not have any secondary complaints.  Objective:  There were no vitals filed for this visit. Podiatric Exam: Vascular: dorsalis pedis and posterior tibial pulses are palpable bilateral. Capillary return is immediate. Temperature gradient is WNL. Skin turgor WNL  Sensorium: Normal Semmes Weinstein monofilament test. Normal tactile sensation bilaterally. Nail Exam: Pt has thick disfigured discolored nails with subungual debris noted bilateral entire nail hallux through fifth toenails.  Pain on palpation to the nails. Ulcer Exam: There is no evidence of ulcer or pre-ulcerative changes or infection. Orthopedic Exam: Muscle tone and strength are WNL. No limitations in general ROM. No crepitus or effusions noted. HAV  B/L.  Hammer toes 2-5  B/L. Skin: No Porokeratosis. No infection or ulcers.  Bilateral severe xerosis noted to bilateral lower extremity    Assessment & Plan:   No diagnosis found.      Patient was evaluated and treated and all questions answered.  Xerosis skin severe bilateral lower extremity -Clinically improved   Onychomycosis with pain  -Nails palliatively debrided as below. -Educated on self-care  Procedure: Nail Debridement Rationale: pain  Type of Debridement: manual, sharp debridement. Instrumentation: Nail nipper, rotary burr. Number of Nails: 10  Procedures and Treatment: Consent by patient was obtained for treatment procedures. The patient understood the discussion of treatment and procedures well. All questions were  answered thoroughly reviewed. Debridement of mycotic and hypertrophic toenails, 1 through 5 bilateral and clearing of subungual debris. No ulceration, no infection noted.  Return Visit-Office Procedure: Patient instructed to return to the office for a follow up visit 3 months for continued evaluation and treatment.  Boneta Lucks, DPM    No follow-ups on file.

## 2021-10-31 ENCOUNTER — Other Ambulatory Visit: Payer: Self-pay | Admitting: Cardiology

## 2021-11-02 ENCOUNTER — Other Ambulatory Visit: Payer: Self-pay | Admitting: Internal Medicine

## 2021-11-10 ENCOUNTER — Other Ambulatory Visit: Payer: Self-pay | Admitting: Internal Medicine

## 2021-11-25 ENCOUNTER — Other Ambulatory Visit: Payer: Self-pay | Admitting: Cardiology

## 2021-11-28 ENCOUNTER — Other Ambulatory Visit: Payer: Self-pay | Admitting: Cardiology

## 2021-12-01 ENCOUNTER — Other Ambulatory Visit: Payer: Self-pay | Admitting: Cardiology

## 2021-12-01 DIAGNOSIS — I4892 Unspecified atrial flutter: Secondary | ICD-10-CM

## 2021-12-02 NOTE — Telephone Encounter (Addendum)
Eliquis '5mg'$  refill request received. Patient is 66 years old, weight-142.7kg, Crea-1.61 on 06/28/2021, Diagnosis-Aflutter, and last seen by Dr. Fransico Him on 11/21/2020-PT NEEDS AN APPT. Dose is appropriate based on dosing criteria.   Pt needs an appt. Message to schedulers.   Pt has an appt on 01/23/2022 with Dr. Radford Pax.

## 2021-12-05 ENCOUNTER — Other Ambulatory Visit: Payer: Self-pay | Admitting: Cardiology

## 2021-12-17 ENCOUNTER — Ambulatory Visit: Payer: BC Managed Care – PPO | Admitting: Physician Assistant

## 2021-12-19 ENCOUNTER — Other Ambulatory Visit: Payer: Self-pay | Admitting: Internal Medicine

## 2021-12-27 ENCOUNTER — Ambulatory Visit (INDEPENDENT_AMBULATORY_CARE_PROVIDER_SITE_OTHER): Payer: BC Managed Care – PPO | Admitting: Internal Medicine

## 2021-12-27 ENCOUNTER — Encounter: Payer: Self-pay | Admitting: Internal Medicine

## 2021-12-27 VITALS — BP 154/92 | HR 79 | Ht 77.0 in | Wt 302.2 lb

## 2021-12-27 DIAGNOSIS — D3502 Benign neoplasm of left adrenal gland: Secondary | ICD-10-CM

## 2021-12-27 DIAGNOSIS — N1831 Chronic kidney disease, stage 3a: Secondary | ICD-10-CM

## 2021-12-27 DIAGNOSIS — E1165 Type 2 diabetes mellitus with hyperglycemia: Secondary | ICD-10-CM

## 2021-12-27 DIAGNOSIS — E1142 Type 2 diabetes mellitus with diabetic polyneuropathy: Secondary | ICD-10-CM

## 2021-12-27 DIAGNOSIS — D3501 Benign neoplasm of right adrenal gland: Secondary | ICD-10-CM

## 2021-12-27 DIAGNOSIS — E1122 Type 2 diabetes mellitus with diabetic chronic kidney disease: Secondary | ICD-10-CM | POA: Diagnosis not present

## 2021-12-27 LAB — POCT GLYCOSYLATED HEMOGLOBIN (HGB A1C): Hemoglobin A1C: 7.5 % — AB (ref 4.0–5.6)

## 2021-12-27 MED ORDER — GLIPIZIDE 5 MG PO TABS: 5.0000 mg | ORAL_TABLET | Freq: Every day | ORAL | 3 refills | Status: DC

## 2021-12-27 MED ORDER — EMPAGLIFLOZIN 10 MG PO TABS: 10.0000 mg | ORAL_TABLET | Freq: Every day | ORAL | 3 refills | Status: DC

## 2021-12-27 MED ORDER — TRULICITY 3 MG/0.5ML ~~LOC~~ SOAJ
3.0000 mg | SUBCUTANEOUS | 3 refills | Status: DC
Start: 2021-12-27 — End: 2022-05-27

## 2021-12-27 NOTE — Patient Instructions (Addendum)
-   Start Jardiance 10 mg, 1 tablet  every morning  - Continue Metformin 500 mg 1 tablet with Breakfast and 1 tablet with supper - Continue  Glipizide 5 mg, 1 tablet before breakfast  - Continue Trulicity  3 mg once a week        - HOW TO TREAT LOW BLOOD SUGARS (Blood sugar LESS THAN 70 MG/DL) Please follow the RULE OF 15 for the treatment of hypoglycemia treatment (when your (blood sugars are less than 70 mg/dL)   STEP 1: Take 15 grams of carbohydrates when your blood sugar is low, which includes:  3-4 GLUCOSE TABS  OR 3-4 OZ OF JUICE OR REGULAR SODA OR ONE TUBE OF GLUCOSE GEL    STEP 2: RECHECK blood sugar in 15 MINUTES STEP 3: If your blood sugar is still low at the 15 minute recheck --> then, go back to STEP 1 and treat AGAIN with another 15 grams of carbohydrates.

## 2021-12-27 NOTE — Progress Notes (Unsigned)
Name: Johnny [name]  Age/ Sex: 66 y.o., male   MRN/ DOB: [id number], 21-Mar-1956     PCP: [name], MD   Reason for Endocrinology Evaluation: Type 2 Diabetes Mellitus  Initial Endocrine Consultative Visit: 08/20/2018    PATIENT IDENTIFIER: [name] is a 66 y.o. male with a past medical history of T2DM, psoriasis, HTN and OSA (Dx 08/2019) . The patient has followed with Endocrinology clinic since 08/20/2018 for consultative assistance with management of his diabetes.     DIABETIC HISTORY:  Mr. Age was diagnosed with T2DM in 2012,. He was on Metformin,Farxiga and Glimepiride in the past. He has never been on insulin. His hemoglobin A1c has ranged from 9.0 % in 2013, peaking at 13.5% in 06/2018.  On his initial visit to our clinic his A1c was 13.5%. He was not taking any of his meds. He was restarted on Metformin and glipizide.   Trulicity was started 32/2025    ADRENAL HISTORY: Upon review of his chart he was noted to have bilateral adrenal nodules on CT abdomen and pelvis from 03/2020.  Right adrenal nodule 3.3 cm and left adrenal nodule 2.8 cm  He did have normal renin and Aldo during evaluation for uncontrolled HTN in 09/2018   SUBJECTIVE:   During the last visit (06/28/2021): A1c 7.5 %.  we continued metformin and Glipizide and increased Trulicity   Today (08/29/621): [name] is here for a  follow up on his diabetes.  He checks his blood sugars 2 times daily, preprandial to breakfast and supper. The patient has not hypoglycemic episodes since the last clinic visit.    He is on Healthy choice meals  Has been noted with weight loss   Follows with Podiatry - Dr. Posey Pronto  Denies nausea, vomiting or diarrhea   HOME DIABETES REGIMEN:  Metformin 500 mg XR 1 tablets BID with meals Glipizide 5 mg,  1 tablet daily  Trulicity 3 mg weekly ( Saturday )     METER DOWNLOAD SUMMARY:8/12-8/25/2023  Average Number Tests/Day = 16 Overall Mean FS  Glucose = 117 Standard Deviation = 19  BG Ranges: Low = 86 High = 155   Hypoglycemic Events/30 Days: BG < 50 = 0 Episodes of symptomatic severe hypoglycemia = 0     DIABETIC COMPLICATIONS: Microvascular complications:  Neuropathy, CKD Denies: retinopathy  Last eye exam: Completed 05/2020   Macrovascular complications:    Denies: CAD, PVD, CVA     HISTORY:  Past Medical History:  Past Medical History:  Diagnosis Date   CKD (chronic kidney disease) stage 3, GFR 30-59 ml/min ([place]) 06/10/2017   Constipation 03/27/2020   Diabetes mellitus    Dysrhythmia    A flutter   Essential hypertension 08/03/2007   Qualifier: Diagnosis of  By: Linda Hedges MD, Heinz Knuckles  Med ARB, diuretic     Guttate psoriasis    Hyperlipidemia associated with type 2 diabetes mellitus ([place]) 08/03/2007   Qualifier: Diagnosis of  By: Linda Hedges MD, Heinz Knuckles  meds - none    Paroxysmal atrial flutter ([place])    S/P TEE/DCCV 07/2019   PSORIASIS, 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   Qualifier: Diagnosis of  By: Linda Hedges MD, Heinz Knuckles    Sleep apnea    Type II diabetes mellitus with renal manifestations, uncontrolled 08/03/2007   Qualifier: Diagnosis of  By: Linda Hedges MD, Heinz Knuckles  Med metformin    Past Surgical History:  Past Surgical History:  Procedure Laterality Date   CARDIOVERSION N/A 07/18/2019  Procedure: CARDIOVERSION;  Surgeon: Dorothy Spark, MD;  Location: [facility] ENDOSCOPY;  Service: Cardiovascular;  Laterality: N/A;   TEE WITHOUT CARDIOVERSION N/A 07/18/2019   Procedure: TRANSESOPHAGEAL ECHOCARDIOGRAM (TEE);  Surgeon: Dorothy Spark, MD;  Location: [facility] ENDOSCOPY;  Service: Cardiovascular;  Laterality: N/A;   TOOTH EXTRACTION     XI ROBOTIC ASSISTED SIMPLE PROSTATECTOMY N/A 05/31/2020   Procedure: XI ROBOTIC ASSISTED SIMPLE PROSTATECTOMY-SUPRAPUBIC;  Surgeon: Cleon Gustin, MD;  Location: [facility] ORS;  Service: Urology;  Laterality: N/A;   Social History:  reports that he has never smoked. He has been exposed to tobacco  smoke. He has never used smokeless tobacco. He reports that he does not drink alcohol and does not use drugs. Family History:  Family History  Problem Relation Age of Onset   Hypertension Father        DOB [id number]   Hyperlipidemia Father    Diabetes Father    Coronary artery disease Father        with stents   Nephrolithiasis Father    Kidney disease Father        s/p rejected kidney transplant; on HD   Memory loss Mother        DOB 28   Kidney disease Mother        s/p nephrectomy, infection problem   Dementia Mother    Cancer Brother        lung   HIV Brother    Prostate cancer Neg Hx    Colon cancer Neg Hx      HOME MEDICATIONS: Allergies as of 12/27/2021   No Known Allergies      Medication List        Accurate as of December 27, 2021  3:34 PM. If you have any questions, ask your nurse or doctor.          ammonium lactate 12 % lotion Commonly known as: AmLactin Apply 1 application topically as needed for dry skin.   atorvastatin 80 MG tablet Commonly known as: LIPITOR TAKE 1 TABLET BY MOUTH EVERY DAY   carvedilol 25 MG tablet Commonly known as: COREG Take 1 tablet (25 mg total) by mouth 2 (two) times daily with a meal.   Eliquis 5 MG Tabs tablet Generic drug: apixaban TAKE 1 TABLET BY MOUTH TWICE A DAY   empagliflozin 10 MG Tabs tablet Commonly known as: Jardiance Take 1 tablet (10 mg total) by mouth daily before breakfast. Started by: Dorita Sciara, MD   finasteride 5 MG tablet Commonly known as: PROSCAR Take 1 tablet (5 mg total) by mouth daily.   fluticasone 50 MCG/ACT nasal spray Commonly known as: FLONASE SPRAY 1 SPRAY INTO BOTH NOSTRILS DAILY.   glipiZIDE 5 MG tablet Commonly known as: GLUCOTROL Take 1 tablet (5 mg total) by mouth daily before breakfast.   hydrALAZINE 50 MG tablet Commonly known as: APRESOLINE TAKE 1 TABLET BY MOUTH THREE TIMES A DAY   isosorbide mononitrate 60 MG 24 hr tablet Commonly known as: IMDUR Take  60 mg by mouth daily.   isosorbide mononitrate 60 MG 24 hr tablet Commonly known as: IMDUR TAKE 1 TABLET BY MOUTH EVERY DAY   isosorbide-hydrALAZINE 20-37.5 MG tablet Commonly known as: BIDIL Take 3 tablets by mouth daily.   metFORMIN 500 MG 24 hr tablet Commonly known as: GLUCOPHAGE-XR Take 1 tablet (500 mg total) by mouth 2 (two) times daily.   olmesartan-hydrochlorothiazide 40-12.5 MG tablet Commonly known as: BENICAR HCT TAKE 1 TABLET BY MOUTH EVERY DAY  Trulicity 3 LZ/7.6BH Sopn Generic drug: Dulaglutide Inject 3 mg as directed once a week.         OBJECTIVE:   Vital Signs: BP (!) 154/92 (BP Location: Left Arm, Patient Position: Sitting, Cuff Size: Normal)   Pulse 79   Ht '6\' 5"'$  (1.956 m)   Wt (!) 302 lb 3.2 oz (137.1 kg)   SpO2 97%   BMI 35.84 kg/m   Wt Readings from Last 3 Encounters:  12/27/21 (!) 302 lb 3.2 oz (137.1 kg)  06/28/21 (!) 314 lb 9.6 oz (142.7 kg)  01/01/21 (!) 327 lb 12.8 oz (148.7 kg)     Exam: General: Pt appears well and is in NAD  Lungs: Clear with good BS bilat with no rales, rhonchi, or wheezes  Heart: RRR with normal S1 and S2 and no gallops; no murmurs; no rub  Extremities: no pretibial edema.  Neuro: MS is good with appropriate affect, pt is alert and Ox3    DM foot exam: 06/2021 per podiatry    DATA REVIEWED:  Lab Results  Component Value Date   HGBA1C 7.5 (A) 12/27/2021   HGBA1C 7.5 (A) 06/28/2021   HGBA1C 7.5 (A) 12/24/2020   CT abdomen 03/27/2020  FINDINGS: Lower chest: Lung bases demonstrate no acute consolidation or effusion. Hazy right subpleural density probably reflects atelectasis.   Hepatobiliary: No focal liver abnormality is seen. No gallstones, gallbladder wall thickening, or biliary dilatation.   Pancreas: Unremarkable. No pancreatic ductal dilatation or surrounding inflammatory changes.   Spleen: Normal in size without focal abnormality.   Adrenals/Urinary Tract: Nodular thickening of adrenal  glands. 3.3 cm hypodense right adrenal nodule. 2.8 cm hypodense left adrenal nodule. Kidneys show mild renal pelvis enlargement and hydroureter but no stones. Moderate perinephric edema and fluid, nonspecific. Bladder is decompressed by Foley catheter.   Stomach/Bowel: The stomach is nonenlarged. No dilated small bowel. Negative appendix. Large amount of stool in the colon without wall thickening.   Vascular/Lymphatic: Moderate aortic atherosclerosis. No aneurysm. No suspicious nodes.   Reproductive: Markedly enlarged prostate gland.   Other: No free air. Bilateral small inguinal hernias containing fat on the left and fat and small fluid on the right.   Musculoskeletal: No acute or significant osseous findings.    ASSESSMENT / PLAN / RECOMMENDATIONS:   1) Type 2 Diabetes Mellitus, Sub- Optimally controlled, With CKD III and neuropathic  complications - Most recent A1c of 7.5 %. Goal A1c < 7.0 %.  -I have praised the patient on his weight loss >10 lbs , he has been following healthy choice diet, but he believes that it is [place] than he would like it to be.  That could explain an A1c that continues to be above 7.0%, this is due to postprandial hyperglycemia -We also discussed add-on therapy with SGLT2 inhibitors, we discussed weight loss benefits, cardiovascular and renal benefits we also discussed the increased risk of genital infections, he was encouraged to stay hydrated  -BMP ***   MEDICATIONS: Start Jardiance 10 mg daily Continue Metformin 500 mg BID  Continue glipizide  5 mg, 1 Tablet before Breakfast  Continue Trulicity 3.0  mg weekly   EDUCATION / INSTRUCTIONS: BG monitoring instructions: Patient is instructed to check his blood sugars 2 times a day, fasting and bedtime. Call [facility] if: BG persistently < 70  I reviewed the Rule of 15 for the treatment of hypoglycemia in detail with the patient. Literature supplied.   2.  Bilateral adrenal  nodules:   -Work  up negative 06/2021  -We will repeat imaging on his next visit by December 2023   3.Dyslipidemia:   -Lipid panel has been at goal   Medication  Atorvastatin 80 mg daily     F/U in 4 months    Signed electronically by: Mack Guise, MD  [facility]  [facility] [address] [place], [place] [address] Phone: [phone] FAX: [phone]   CC: [name], [place] [address] Phone: [phone]  Fax: [phone]  Return to Endocrinology clinic as below: Future Appointments  Date Time Provider [place]  01/01/2022  3:45 PM Felipa Furnace, [place] [facility]  01/23/2022  3:20 PM Sueanne Margarita, MD [facility]  05/27/2022  2:20 PM Novie Maggio, Melanie Crazier, MD [facility] None  05/28/2022  2:45 PM McKenzie, Candee Furbish, MD [facility] None

## 2021-12-28 LAB — BASIC METABOLIC PANEL
BUN/Creatinine Ratio: 12 (ref 10–24)
BUN: 19 mg/dL (ref 8–27)
CO2: 22 mmol/L (ref 20–29)
Calcium: 9.1 mg/dL (ref 8.6–10.2)
Chloride: 106 mmol/L (ref 96–106)
Creatinine, Ser: 1.56 mg/dL — ABNORMAL HIGH (ref 0.76–1.27)
Glucose: 122 mg/dL — ABNORMAL HIGH (ref 70–99)
Potassium: 4.7 mmol/L (ref 3.5–5.2)
Sodium: 142 mmol/L (ref 134–144)
eGFR: 50 mL/min/{1.73_m2} — ABNORMAL LOW (ref >59)

## 2021-12-30 ENCOUNTER — Telehealth: Payer: Self-pay | Admitting: Internal Medicine

## 2021-12-30 MED ORDER — METFORMIN HCL ER 500 MG PO TB24: 500.0000 mg | ORAL_TABLET | Freq: Two times a day (BID) | ORAL | 3 refills | Status: DC

## 2021-12-30 NOTE — Telephone Encounter (Signed)
Attempted to contact patient, no answer and vm is full.

## 2021-12-30 NOTE — Telephone Encounter (Signed)
Please let the patient know that his kidney function has shown improvement and to continue taking metformin twice a day   I have already started him on Jardiance, and to continue Trulicity and glipizide as prescribed   Thank

## 2021-12-31 NOTE — Telephone Encounter (Signed)
2nd attempt to contact the patient, unable to leave a voicemail as it is currently full.

## 2022-01-01 ENCOUNTER — Ambulatory Visit (INDEPENDENT_AMBULATORY_CARE_PROVIDER_SITE_OTHER): Payer: BC Managed Care – PPO | Admitting: Podiatry

## 2022-01-01 ENCOUNTER — Encounter: Payer: Self-pay | Admitting: Internal Medicine

## 2022-01-01 DIAGNOSIS — E1142 Type 2 diabetes mellitus with diabetic polyneuropathy: Secondary | ICD-10-CM

## 2022-01-01 DIAGNOSIS — M79675 Pain in left toe(s): Secondary | ICD-10-CM

## 2022-01-01 DIAGNOSIS — M79674 Pain in right toe(s): Secondary | ICD-10-CM | POA: Diagnosis not present

## 2022-01-01 DIAGNOSIS — B351 Tinea unguium: Secondary | ICD-10-CM

## 2022-01-01 NOTE — Progress Notes (Signed)
  Subjective:  Patient ID: Johnny [name], male    DOB: 14-Sep-1955,  MRN: [id number]  Chief Complaint  Patient presents with   Nail Problem    Nail trim    66 y.o. male returns for the above complaint.  Patient presents with thickened elongated dystrophic toenails x10.  Patient is a painful to walk on.  Patient would like to have them debrided down as he is not able to do it himself.  Patient is a diabetic with last A1c of 7.6.  He does not have any secondary complaints.  Objective:  There were no vitals filed for this visit. Podiatric Exam: Vascular: dorsalis pedis and posterior tibial pulses are palpable bilateral. Capillary return is immediate. Temperature gradient is WNL. Skin turgor WNL  Sensorium: Normal Semmes Weinstein monofilament test. Normal tactile sensation bilaterally. Nail Exam: Pt has thick disfigured discolored nails with subungual debris noted bilateral entire nail hallux through fifth toenails.  Pain on palpation to the nails. Ulcer Exam: There is no evidence of ulcer or pre-ulcerative changes or infection. Orthopedic Exam: Muscle tone and strength are WNL. No limitations in general ROM. No crepitus or effusions noted. HAV  B/L.  Hammer toes 2-5  B/L. Skin: No Porokeratosis. No infection or ulcers.  Bilateral severe xerosis noted to bilateral lower extremity    Assessment & Plan:   1. Pain due to onychomycosis of toenails of both feet   2. Type 2 diabetes mellitus with diabetic polyneuropathy, without long-term current use of insulin ([place])         Patient was evaluated and treated and all questions answered.  Xerosis skin severe bilateral lower extremity -Clinically improved   Onychomycosis with pain  -Nails palliatively debrided as below. -Educated on self-care  Procedure: Nail Debridement Rationale: pain  Type of Debridement: manual, sharp debridement. Instrumentation: Nail nipper, rotary burr. Number of Nails: 10  Procedures and Treatment:  Consent by patient was obtained for treatment procedures. The patient understood the discussion of treatment and procedures well. All questions were answered thoroughly reviewed. Debridement of mycotic and hypertrophic toenails, 1 through 5 bilateral and clearing of subungual debris. No ulceration, no infection noted.  Return Visit-Office Procedure: Patient instructed to return to the office for a follow up visit 3 months for continued evaluation and treatment.  Boneta Lucks, DPM    No follow-ups on file.

## 2022-01-01 NOTE — Telephone Encounter (Signed)
3rd attempt to contact the patient regarding results and received no answer, mailbox is full.

## 2022-01-09 ENCOUNTER — Other Ambulatory Visit: Payer: Self-pay | Admitting: Cardiology

## 2022-01-10 ENCOUNTER — Ambulatory Visit: Payer: BC Managed Care – PPO | Admitting: Internal Medicine

## 2022-01-10 ENCOUNTER — Encounter: Payer: Self-pay | Admitting: Internal Medicine

## 2022-01-10 VITALS — BP 138/82 | HR 79 | Temp 98.3°F | Ht 77.0 in | Wt 303.0 lb

## 2022-01-10 DIAGNOSIS — E118 Type 2 diabetes mellitus with unspecified complications: Secondary | ICD-10-CM | POA: Diagnosis not present

## 2022-01-10 DIAGNOSIS — E785 Hyperlipidemia, unspecified: Secondary | ICD-10-CM

## 2022-01-10 DIAGNOSIS — Z0000 Encounter for general adult medical examination without abnormal findings: Secondary | ICD-10-CM

## 2022-01-10 DIAGNOSIS — Z86010 Personal history of colonic polyps: Secondary | ICD-10-CM

## 2022-01-10 DIAGNOSIS — Z23 Encounter for immunization: Secondary | ICD-10-CM

## 2022-01-10 DIAGNOSIS — N1831 Chronic kidney disease, stage 3a: Secondary | ICD-10-CM

## 2022-01-10 DIAGNOSIS — I5022 Chronic systolic (congestive) heart failure: Secondary | ICD-10-CM

## 2022-01-10 DIAGNOSIS — E1169 Type 2 diabetes mellitus with other specified complication: Secondary | ICD-10-CM

## 2022-01-10 DIAGNOSIS — I4892 Unspecified atrial flutter: Secondary | ICD-10-CM

## 2022-01-10 DIAGNOSIS — I10 Essential (primary) hypertension: Secondary | ICD-10-CM

## 2022-01-10 MED ORDER — ISOSORBIDE MONONITRATE ER 60 MG PO TB24
60.0000 mg | ORAL_TABLET | Freq: Every day | ORAL | 3 refills | Status: DC
Start: 2022-01-10 — End: 2022-12-01

## 2022-01-10 MED ORDER — CARVEDILOL 25 MG PO TABS
25.0000 mg | ORAL_TABLET | Freq: Two times a day (BID) | ORAL | 3 refills | Status: DC
Start: 2022-01-10 — End: 2023-04-22

## 2022-01-10 MED ORDER — HYDRALAZINE HCL 50 MG PO TABS
50.0000 mg | ORAL_TABLET | Freq: Three times a day (TID) | ORAL | 3 refills | Status: DC
Start: 2022-01-10 — End: 2022-01-23

## 2022-01-10 NOTE — Progress Notes (Signed)
   Subjective:   Patient ID: Johnny [name], male    DOB: [birth year], 66 y.o.   MRN: [id number]  HPI The patient is here for physical.  PMH, [facility], social history reviewed and updated  Review of Systems  Constitutional: Negative.   HENT: Negative.    Eyes: Negative.   Respiratory:  Negative for cough, chest tightness and shortness of breath.   Cardiovascular:  Negative for chest pain, palpitations and leg swelling.  Gastrointestinal:  Negative for abdominal distention, abdominal pain, constipation, diarrhea, nausea and vomiting.  Musculoskeletal:  Positive for arthralgias.  Skin: Negative.   Neurological: Negative.   Psychiatric/Behavioral: Negative.      Objective:  Physical Exam Constitutional:      Appearance: He is well-developed. He is obese.  HENT:     Head: Normocephalic and atraumatic.  Cardiovascular:     Rate and Rhythm: Normal rate and regular rhythm.  Pulmonary:     Effort: Pulmonary effort is normal. No respiratory distress.     Breath sounds: Normal breath sounds. No wheezing or rales.  Abdominal:     General: Bowel sounds are normal. There is no distension.     Palpations: Abdomen is soft.     Tenderness: There is no abdominal tenderness. There is no rebound.  Musculoskeletal:     Cervical back: Normal range of motion.  Skin:    General: Skin is warm and dry.     Comments: Foot exam done  Neurological:     Mental Status: He is alert and oriented to person, place, and time.     Coordination: Coordination normal.     Vitals:   01/10/22 1428  BP: 138/82  Pulse: 79  Temp: 98.3 F (36.8 C)  TempSrc: Oral  SpO2: 94%  Weight: (!) 303 lb (137.4 kg)  Height: '6\' 5"'$  (1.956 m)    Assessment & Plan:  Tdap given at visit

## 2022-01-10 NOTE — Assessment & Plan Note (Signed)
No symptoms today, refilled coreg 25 mg BID.

## 2022-01-10 NOTE — Assessment & Plan Note (Addendum)
BP at goal refilled hydralazine 50 mg TID and imdur 60 mg daily and coreg 25 mg BID and olmesartan/hctz 40/12.5 mg daily. Continue reviewed recent BMP.

## 2022-01-10 NOTE — Assessment & Plan Note (Signed)
Stable no flare today. Refilled coreg 25 mg BID and lipitor 80 mg daily and imdur and hydralazine. Is on ARB.

## 2022-01-10 NOTE — Assessment & Plan Note (Signed)
Refilled lipitor 80 mg daily and continue. Reviewed lipid panel at goal.

## 2022-01-10 NOTE — Assessment & Plan Note (Signed)
Flu shot counseled. Covid-19 counseled. Shingrix counseled. Tetanus given. Colonoscopy referral to GI done overdue. Counseled about sun safety and mole surveillance. Counseled about the dangers of distracted driving. Given 10 year screening recommendations.

## 2022-01-10 NOTE — Patient Instructions (Signed)
We have refilled all the medicine and have given you the tetanus shot.

## 2022-01-10 NOTE — Assessment & Plan Note (Signed)
Reviewed recent labs stable. BP and DM at goal.

## 2022-01-10 NOTE — Assessment & Plan Note (Signed)
BMI 09.8 complicated by diabetes, heart failure, hypertension, hyperlipidemia.

## 2022-01-10 NOTE — Assessment & Plan Note (Signed)
Foot exam done, labs through endo reviewed and appropriate.

## 2022-01-23 ENCOUNTER — Telehealth: Payer: Self-pay | Admitting: *Deleted

## 2022-01-23 ENCOUNTER — Encounter: Payer: Self-pay | Admitting: Cardiology

## 2022-01-23 ENCOUNTER — Ambulatory Visit: Payer: BC Managed Care – PPO | Attending: Physician Assistant | Admitting: Cardiology

## 2022-01-23 VITALS — BP 168/92 | HR 74 | Ht 77.0 in | Wt 306.6 lb

## 2022-01-23 DIAGNOSIS — I10 Essential (primary) hypertension: Secondary | ICD-10-CM

## 2022-01-23 DIAGNOSIS — G4733 Obstructive sleep apnea (adult) (pediatric): Secondary | ICD-10-CM | POA: Diagnosis not present

## 2022-01-23 DIAGNOSIS — I5022 Chronic systolic (congestive) heart failure: Secondary | ICD-10-CM

## 2022-01-23 DIAGNOSIS — I4892 Unspecified atrial flutter: Secondary | ICD-10-CM

## 2022-01-23 MED ORDER — HYDRALAZINE HCL 50 MG PO TABS: 75.0000 mg | ORAL_TABLET | Freq: Three times a day (TID) | ORAL | 3 refills | Status: DC

## 2022-01-23 NOTE — Progress Notes (Signed)
Cardiology Office Note    Date:  01/23/2022   ID:  Johnny [name], DOB Sep 27, 1955, MRN [id number]  PCP:  Johnny Koch, MD  Cardiologist:  Johnny Him, MD   Chief Complaint  Patient presents with   Sleep Apnea   Hypertension   Atrial Flutter     History of Present Illness:  Johnny [name] is a 66 y.o. 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  He also has a hx of medical noncompliance.  His last 2D echo 08/2019 showed normal LVF with mild LVH and moderate biatrial enlargement. He has a hx of PAFlutter requiring TEE/DCCV in the past.   He underwent sleep study due to atrial flutter which showed severe OSA with an AHI of 92/hr and nocturnal hypoxemia with O2 sats as low as 85%.  He underwent CPAP titration but due to ongoing events he was transitioned to auto BiPAP   He is here today for followup and is doing well.  He denies any chest pain or pressure, SOB, DOE, PND, orthopnea, LE edema (except if he stands too long at work), dizziness, palpitations or syncope. He is compliant with his meds and is tolerating meds with no SE.    He is doing well with his BiPAP device and thinks that He has gotten used to it.  He tolerates the nasal mask abut does not use a chin strap.  He feels the pressure is adequate.  Since going on PAP he feels rested in the am and has no significant daytime sleepiness. He does have a dry mouth in the morning. He does not think that he snores.    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   Qualifier: Diagnosis of  By: Linda Hedges MD, Heinz Knuckles    Sleep apnea    Type II diabetes mellitus with renal manifestations, uncontrolled 08/03/2007   Qualifier: Diagnosis of  By: Linda Hedges MD, Heinz Knuckles  Med metformin     Past Surgical History:  Procedure Laterality Date   CARDIOVERSION N/A 07/18/2019   Procedure: CARDIOVERSION;  Surgeon: Dorothy Spark, MD;  Location: [facility];  Service: Cardiovascular;  Laterality: N/A;   TEE WITHOUT CARDIOVERSION N/A 07/18/2019   Procedure: TRANSESOPHAGEAL ECHOCARDIOGRAM (TEE);  Surgeon: Dorothy Spark, MD;  Location: [facility] ENDOSCOPY;  Service: Cardiovascular;  Laterality: N/A;   TOOTH EXTRACTION     XI ROBOTIC ASSISTED SIMPLE PROSTATECTOMY N/A 05/31/2020   Procedure: XI ROBOTIC ASSISTED SIMPLE PROSTATECTOMY-SUPRAPUBIC;  Surgeon: Cleon Gustin, MD;  Location: [facility];  Service: Urology;  Laterality: N/A;    Current Medications: Current Meds  Medication Sig   ammonium lactate (AMLACTIN) 12 % lotion Apply 1 application topically as needed for dry skin.   apixaban (ELIQUIS) 5 MG  TABS tablet TAKE 1 TABLET BY MOUTH TWICE A DAY   atorvastatin (LIPITOR) 80 MG tablet TAKE 1 TABLET BY MOUTH EVERY DAY   carvedilol (COREG) 25 MG tablet Take 1 tablet (25 mg total) by mouth 2 (two) times daily with a meal.   Dulaglutide (TRULICITY) 3 LE/7.5TZ SOPN Inject 3 mg as directed once a week.   empagliflozin (JARDIANCE) 10 MG TABS tablet Take 1 tablet (10 mg total) by mouth daily before breakfast.   finasteride (PROSCAR) 5 MG tablet Take 1 tablet (5 mg total) by mouth daily.   fluticasone (FLONASE) 50 MCG/ACT nasal spray SPRAY 1 SPRAY INTO BOTH NOSTRILS DAILY.   glipiZIDE (GLUCOTROL) 5 MG tablet Take 1 tablet (5 mg total) by mouth daily before breakfast.   hydrALAZINE (APRESOLINE) 50 MG tablet Take 1  tablet (50 mg total) by mouth 3 (three) times daily.   isosorbide mononitrate (IMDUR) 60 MG 24 hr tablet Take 1 tablet (60 mg total) by mouth daily.   metFORMIN (GLUCOPHAGE-XR) 500 MG 24 hr tablet Take 1 tablet (500 mg total) by mouth 2 (two) times daily.   olmesartan-hydrochlorothiazide (BENICAR HCT) 40-12.5 MG tablet TAKE 1 TABLET BY MOUTH EVERY DAY    Allergies:   Patient has no known allergies.   Social History   Socioeconomic History   Marital status: Married    Spouse name: Not on file   Number of children: 2   Years of education: 12   Highest education level: Not on file  Occupational History   Occupation: mfg  Tobacco Use   Smoking status: Never    Passive exposure: Yes   Smokeless tobacco: Never  Vaping Use   Vaping Use: Never used  Substance and Sexual Activity   Alcohol use: No   Drug use: No   Sexual activity: Yes  Other Topics Concern   Not on file  Social History Narrative   [employer]. [employer]. [employer] x 4 years. Married '94. 2 sons - '95, '96. Wife is healthy. Work - Banker.   Social Determinants of Radio broadcast assistant Strain: Not on file  Food Insecurity: Not on file  Transportation Needs: Not on file  Physical Activity: Not on file  Stress: Not on file  Social Connections: Not on file     Family History:  The patient's family history includes Cancer in his brother; Coronary artery disease in his father; Dementia in his mother; Diabetes in his father; HIV in his brother; Hyperlipidemia in his father; Hypertension in his father; Kidney disease in his father and mother; Memory loss in his mother; Nephrolithiasis in his father.   ROS:   Please see the history of present illness.    ROS All other systems reviewed and are negative.      No data to display          PHYSICAL EXAM:   VS:  BP (!) 168/92   Pulse 74   Ht '6\' 5"'$  (1.956 m)   Wt (!) 306 lb 9.6 oz (139.1 kg)   SpO2 98%   BMI 36.36 kg/m     GEN: Well nourished, well developed in no  acute distress HEENT: Normal NECK: No JVD; No carotid bruits LYMPHATICS: No lymphadenopathy CARDIAC:RRR, no murmurs, rubs, gallops RESPIRATORY:  Clear to auscultation without rales, wheezing or rhonchi  ABDOMEN: Soft, non-tender, non-distended MUSCULOSKELETAL:  No edema; No deformity  SKIN: Warm and dry NEUROLOGIC:  Alert and oriented x 3 PSYCHIATRIC:  Normal affect  Wt Readings from Last 3  Encounters:  01/23/22 (!) 306 lb 9.6 oz (139.1 kg)  01/10/22 (!) 303 lb (137.4 kg)  12/27/21 (!) 302 lb 3.2 oz (137.1 kg)      Studies/Labs Reviewed:   EKG:  EKG is not ordered today.  2D echo 08/2019 IMPRESSIONS    1. Left ventricular ejection fraction, by estimation, is 60 to 65%. The  left ventricle has normal function. The left ventricle has no regional  wall motion abnormalities. There is mild concentric left ventricular  hypertrophy. Left ventricular diastolic  parameters are indeterminate.   2. Right ventricular systolic function is normal. The right ventricular  size is mildly enlarged. There is normal pulmonary artery systolic  pressure.   3. Left atrial size was moderately dilated.   4. Right atrial size was moderately dilated.   5. The mitral valve is normal in structure. No evidence of mitral valve  regurgitation. No evidence of mitral stenosis.   6. The aortic valve is normal in structure. Aortic valve regurgitation is  not visualized. No aortic stenosis is present.   7. Aortic dilatation noted. There is mild dilatation at the level of the  sinuses of Valsalva measuring 40 mm.   8. The inferior vena cava is normal in size with greater than 50%  respiratory variability, suggesting right atrial pressure of 3 mmHg.   Recent Labs: 12/27/2021: BUN 19; Creatinine, Ser 1.56; Potassium 4.7; Sodium 142   Lipid Panel    Component Value Date/Time   CHOL 117 06/28/2021 1604   TRIG 78 06/28/2021 1604   TRIG 63 05/01/2006 1329   HDL 34 (L) 06/28/2021 1604   CHOLHDL 3.4 06/28/2021  1604   CHOLHDL 6.3 07/15/2019 0408   VLDL 13 07/15/2019 0408   LDLCALC 67 06/28/2021 1604   LDLDIRECT 170.2 11/19/2012 1105    Additional studies/ records that were reviewed today include:  Labs and PAP compliance download  EKG was performed today and demonstrates NSR with nonspecific T wave abnormality  HYPERTENSION CONTROL Vitals:   01/23/22 1524 01/23/22 1554  BP: (!) 164/92 (!) 168/92    The patient's blood pressure is elevated above target today.  In order to address the patient's elevated BP: A current anti-hypertensive medication was adjusted today.; A referral to the [facility] Hypertension [facility] will be placed.      ASSESSMENT:    1. OSA (obstructive sleep apnea)   2. Essential hypertension   3. Paroxysmal atrial flutter (HCC)      PLAN:  In order of problems listed above:  1.  OSA - The patient is tolerating PAP therapy well without any problems. The PAP download performed by his DME was personally reviewed and interpreted by me today and showed an AHI of 11.2/hr on auto BiPAP cm H2O with 100% compliance in using more than 4 hours nightly.  The patient has been using and benefiting from PAP use and will continue to benefit from therapy.  -AHI is still elevated but he uses a nasal mask with no chin strap and has a dry mouth in the am so I suspect he is mouth breathing some -I will add a chin strap and repeat download in 6 weeks  2.  HTN -BP is poorly controlled on exam today -Continue prescription drug management with carvedilol 25 mg twice daily,  Imdur '60mg'$  daily and olmesartan HCT 40-12.5 mg daily with as needed -increase Hydralazine to '75mg'$  TID and followup with PharmD in HTN clinic in 2 weeks -I have personally reviewed and interpreted outside labs performed  by patient's PCP which showed serum creatinine 1.56 and potassium 4.7 on 12/27/2021  3.  PAflutter -He is maintaining sinus rhythm on  today denies any palpitations -He has not had any further bleeding  problems on DOAC>hematuria resolved (followed by Urology) -Check hemoglobin today -Continue  prescription drug management with carvedilol 25 mg twice daily and Eliquis 5 mg twice daily with as needed refills  Followup with me in 6 months  Medication Adjustments/Labs and Tests Ordered: Current medicines are reviewed at length with the patient today.  Concerns regarding medicines are outlined above.  Medication changes, Labs and Tests ordered today are listed in the Patient Instructions below.  Patient Instructions  Medication Instructions:  Your physician recommends that you continue on your current medications as directed. Please refer to the Current Medication list given to you today.  *If you need a refill on your cardiac medications before your next appointment, please call your pharmacy*  Follow-Up: At [facility], you and your health needs are our priority.  As part of our continuing mission to provide you with exceptional heart care, we have created designated Provider Care Teams.  These Care Teams include your primary Cardiologist (physician) and Advanced Practice Providers (APPs -  Physician Assistants and Nurse Practitioners) who all work together to provide you with the care you need, when you need it.  Your next appointment:   1 year(s)  The format for your next appointment:   In Person  Provider:   Fransico Him, MD     Important Information About Sugar         Signed, Johnny Him, MD  01/23/2022 4:03 PM    [facility] [address], [place], [place]  [address] Phone: [phone]; Fax: [phone]

## 2022-01-23 NOTE — Telephone Encounter (Signed)
-----   Message from Antonieta Iba, RN sent at 01/23/2022  3:53 PM EDT ----- Per Dr. Radford Pax: Please order a chin strap and get a download in 6 weeks.  Thanks! Johnny [name]

## 2022-01-23 NOTE — Patient Instructions (Addendum)
Medication Instructions:  Your physician has recommended you make the following change in your medication:  1) INCREASE hydralazine 75 mg three times per day   *If you need a refill on your cardiac medications before your next appointment, please call your pharmacy*  Follow-Up: At [facility], you and your health needs are our priority.  As part of our continuing mission to provide you with exceptional heart care, we have created designated Provider Care Teams.  These Care Teams include your primary Cardiologist (physician) and Advanced Practice Providers (APPs -  Physician Assistants and Nurse Practitioners) who all work together to provide you with the care you need, when you need it.  Your next appointment:   Nurse visit in 2 weeks   3 months with Dr. Radford Pax   The format for your next appointment:   In Person  Provider:   Fransico Him, MD     Important Information About Sugar

## 2022-01-23 NOTE — Addendum Note (Signed)
Addended by: Antonieta Iba on: 01/23/2022 04:08 PM   Modules accepted: Orders

## 2022-01-23 NOTE — Telephone Encounter (Signed)
Order placed to [facility] via community message 

## 2022-02-06 ENCOUNTER — Ambulatory Visit: Payer: BC Managed Care – PPO | Attending: Cardiovascular Disease

## 2022-02-06 ENCOUNTER — Telehealth: Payer: Self-pay | Admitting: *Deleted

## 2022-02-06 DIAGNOSIS — I10 Essential (primary) hypertension: Secondary | ICD-10-CM

## 2022-02-06 NOTE — Telephone Encounter (Signed)
Called patient and gave him the number to [employer] [facility]ly as all patients have to call in for their supplies. [phone].

## 2022-02-06 NOTE — Progress Notes (Signed)
   Nurse Visit   Date of Encounter: 02/06/2022 ID: Johnny [name], DOB Dec 03, 1955, MRN [id number]  PCP:  [name], MD   [facility] Providers Cardiologist:  Fransico Him, MD {  Visit Details   VS:  BP 132/86   Pulse 79   Ht '6\' 5"'$  (1.956 m)   Wt (!) 308 lb 12.8 oz (140.1 kg)   SpO2 94%   BMI 36.62 kg/m  , BMI Body mass index is 36.62 kg/m.  Wt Readings from Last 3 Encounters:  02/06/22 (!) 308 lb 12.8 oz (140.1 kg)  01/23/22 (!) 306 lb 9.6 oz (139.1 kg)  01/10/22 (!) 303 lb (137.4 kg)     Reason for visit: BP check Performed today: Vitals, Provider consulted:Dr. Mealor, and Education Changes (medications, testing, etc.) : none Length of Visit: 20 minutes  5.   Patient reports he feels fine and has no complaints.    Medications Adjustments/Labs and Tests Ordered: No orders of the defined types were placed in this encounter.   Signed, Tor Netters, RN  02/06/2022 3:36 PM

## 2022-02-06 NOTE — Telephone Encounter (Signed)
-----   Message from Tor Netters, RN sent at 02/06/2022  3:39 PM EDT ----- Regarding: Chin strap for CPap Patient never from anyone regarding the Chin strap that Dr. Radford Pax requested for him. Thanks Bevely Palmer

## 2022-02-06 NOTE — Patient Instructions (Signed)
Medication Instructions:  Your physician recommends that you continue on your current medications as directed. Please refer to the Current Medication list given to you today.  *If you need a refill on your cardiac medications before your next appointment, please call your pharmacy*  Follow-Up: At [facility], you and your health needs are our priority.  As part of our continuing mission to provide you with exceptional heart care, we have created designated Provider Care Teams.  These Care Teams include your primary Cardiologist (physician) and Advanced Practice Providers (APPs -  Physician Assistants and Nurse Practitioners) who all work together to provide you with the care you need, when you need it.  We recommend signing up for the patient portal called "MyChart".  Sign up information is provided on this After Visit Summary.  MyChart is used to connect with patients for Virtual Visits (Telemedicine).  Patients are able to view lab/test results, encounter notes, upcoming appointments, etc.  Non-urgent messages can be sent to your provider as well.   To learn more about what you can do with MyChart, go to [email or web address].    Your next appointment:   As scheduled  Provider:   Fransico Him, MD     Important Information About Sugar

## 2022-02-19 ENCOUNTER — Other Ambulatory Visit: Payer: Self-pay | Admitting: Cardiology

## 2022-03-16 ENCOUNTER — Other Ambulatory Visit: Payer: Self-pay | Admitting: Cardiology

## 2022-03-16 DIAGNOSIS — I4892 Unspecified atrial flutter: Secondary | ICD-10-CM

## 2022-03-17 NOTE — Telephone Encounter (Signed)
Eliquis '5mg'$  refill request received. Patient is 66 years old, weight-140.1kg, Crea-1.56 on 12/27/2021, Diagnosis-Aflutter, and last seen by  Dr. Radford Pax on 01/23/2022. Dose is appropriate based on dosing criteria. Will send in refill to requested pharmacy.

## 2022-04-09 ENCOUNTER — Encounter: Payer: Self-pay | Admitting: Podiatry

## 2022-04-09 ENCOUNTER — Ambulatory Visit (INDEPENDENT_AMBULATORY_CARE_PROVIDER_SITE_OTHER): Payer: BC Managed Care – PPO | Admitting: Podiatry

## 2022-04-09 DIAGNOSIS — M79674 Pain in right toe(s): Secondary | ICD-10-CM | POA: Diagnosis not present

## 2022-04-09 DIAGNOSIS — B351 Tinea unguium: Secondary | ICD-10-CM

## 2022-04-09 DIAGNOSIS — E1142 Type 2 diabetes mellitus with diabetic polyneuropathy: Secondary | ICD-10-CM

## 2022-04-09 DIAGNOSIS — M79675 Pain in left toe(s): Secondary | ICD-10-CM | POA: Diagnosis not present

## 2022-04-09 NOTE — Progress Notes (Signed)
  Subjective:  Patient ID: Johnny [name], male    DOB: 03/18/1956,  MRN: [id number]  Chief Complaint  Patient presents with   Nail Problem    Nail trim    66 y.o. male returns for the above complaint.  Patient presents with thickened elongated dystrophic toenails x10.  Patient is a painful to walk on.  Patient would like to have them debrided down as he is not able to do it himself.  Patient is a diabetic with last A1c of 7.6.  He does not have any secondary complaints.  Objective:  There were no vitals filed for this visit. Podiatric Exam: Vascular: dorsalis pedis and posterior tibial pulses are palpable bilateral. Capillary return is immediate. Temperature gradient is WNL. Skin turgor WNL  Sensorium: Normal Semmes Weinstein monofilament test. Normal tactile sensation bilaterally. Nail Exam: Pt has thick disfigured discolored nails with subungual debris noted bilateral entire nail hallux through fifth toenails.  Pain on palpation to the nails. Ulcer Exam: There is no evidence of ulcer or pre-ulcerative changes or infection. Orthopedic Exam: Muscle tone and strength are WNL. No limitations in general ROM. No crepitus or effusions noted. HAV  B/L.  Hammer toes 2-5  B/L. Skin: No Porokeratosis. No infection or ulcers.  Bilateral severe xerosis noted to bilateral lower extremity    Assessment & Plan:   1. Pain due to onychomycosis of toenails of both feet   2. Type 2 diabetes mellitus with diabetic polyneuropathy, without long-term current use of insulin (HCC)          Patient was evaluated and treated and all questions answered.  Xerosis skin severe bilateral lower extremity -Clinically improved   Onychomycosis with pain  -Nails palliatively debrided as below. -Educated on self-care  Procedure: Nail Debridement Rationale: pain  Type of Debridement: manual, sharp debridement. Instrumentation: Nail nipper, rotary burr. Number of Nails: 10  Procedures and Treatment:  Consent by patient was obtained for treatment procedures. The patient understood the discussion of treatment and procedures well. All questions were answered thoroughly reviewed. Debridement of mycotic and hypertrophic toenails, 1 through 5 bilateral and clearing of subungual debris. No ulceration, no infection noted.  Return Visit-Office Procedure: Patient instructed to return to the office for a follow up visit 3 months for continued evaluation and treatment.  Aquan Kope, DPM    No follow-ups on file. 

## 2022-05-08 ENCOUNTER — Other Ambulatory Visit: Payer: Self-pay | Admitting: Internal Medicine

## 2022-05-27 ENCOUNTER — Ambulatory Visit (INDEPENDENT_AMBULATORY_CARE_PROVIDER_SITE_OTHER): Payer: BC Managed Care – PPO | Admitting: Internal Medicine

## 2022-05-27 ENCOUNTER — Encounter: Payer: Self-pay | Admitting: Internal Medicine

## 2022-05-27 VITALS — BP 154/78 | HR 80 | Ht 77.0 in | Wt 311.0 lb

## 2022-05-27 DIAGNOSIS — E1142 Type 2 diabetes mellitus with diabetic polyneuropathy: Secondary | ICD-10-CM

## 2022-05-27 DIAGNOSIS — D3502 Benign neoplasm of left adrenal gland: Secondary | ICD-10-CM

## 2022-05-27 DIAGNOSIS — N1831 Chronic kidney disease, stage 3a: Secondary | ICD-10-CM

## 2022-05-27 DIAGNOSIS — D3501 Benign neoplasm of right adrenal gland: Secondary | ICD-10-CM | POA: Diagnosis not present

## 2022-05-27 DIAGNOSIS — E1165 Type 2 diabetes mellitus with hyperglycemia: Secondary | ICD-10-CM | POA: Diagnosis not present

## 2022-05-27 DIAGNOSIS — E1122 Type 2 diabetes mellitus with diabetic chronic kidney disease: Secondary | ICD-10-CM | POA: Diagnosis not present

## 2022-05-27 LAB — POCT GLYCOSYLATED HEMOGLOBIN (HGB A1C): Hemoglobin A1C: 7.8 % — AB (ref 4.0–5.6)

## 2022-05-27 LAB — BASIC METABOLIC PANEL
BUN: 21 mg/dL (ref 6–23)
CO2: 27 mEq/L (ref 19–32)
Calcium: 9.2 mg/dL (ref 8.4–10.5)
Chloride: 107 mEq/L (ref 96–112)
Creatinine, Ser: 2.05 mg/dL — ABNORMAL HIGH (ref 0.40–1.50)
GFR: 33.69 mL/min — ABNORMAL LOW (ref >60.00)
Glucose, Bld: 87 mg/dL (ref 70–99)
Potassium: 4.6 mEq/L (ref 3.5–5.1)
Sodium: 142 mEq/L (ref 135–145)

## 2022-05-27 LAB — CBC
HCT: 42.6 % (ref 39.0–52.0)
Hemoglobin: 13.9 g/dL (ref 13.0–17.0)
MCHC: 32.6 g/dL (ref 30.0–36.0)
MCV: 89.8 fl (ref 78.0–100.0)
Platelets: 248 10*3/uL (ref 150.0–400.0)
RBC: 4.74 Mil/uL (ref 4.22–5.81)
RDW: 15 % (ref 11.5–15.5)
WBC: 4.5 10*3/uL (ref 4.0–10.5)

## 2022-05-27 LAB — LIPID PANEL
Cholesterol: 105 mg/dL (ref 0–200)
HDL: 35.5 mg/dL — ABNORMAL LOW (ref >39.00)
LDL Cholesterol: 52 mg/dL (ref 0–99)
NonHDL: 69.61
Total CHOL/HDL Ratio: 3
Triglycerides: 86 mg/dL (ref 0.0–149.0)
VLDL: 17.2 mg/dL (ref 0.0–40.0)

## 2022-05-27 LAB — MICROALBUMIN / CREATININE URINE RATIO
Creatinine,U: 150.1 mg/dL
Microalb Creat Ratio: 5 mg/g (ref 0.0–30.0)
Microalb, Ur: 7.6 mg/dL — ABNORMAL HIGH (ref 0.0–1.9)

## 2022-05-27 MED ORDER — TRULICITY 3 MG/0.5ML ~~LOC~~ SOAJ: 3.0000 mg | SUBCUTANEOUS | 3 refills | Status: DC

## 2022-05-27 MED ORDER — METFORMIN HCL ER 500 MG PO TB24: 500.0000 mg | ORAL_TABLET | Freq: Two times a day (BID) | ORAL | 3 refills | Status: DC

## 2022-05-27 MED ORDER — EMPAGLIFLOZIN 25 MG PO TABS: 25.0000 mg | ORAL_TABLET | Freq: Every day | ORAL | 3 refills | Status: DC

## 2022-05-27 MED ORDER — GLIPIZIDE 5 MG PO TABS: 5.0000 mg | ORAL_TABLET | Freq: Every day | ORAL | 1 refills | Status: DC

## 2022-05-27 NOTE — Patient Instructions (Signed)
-  Increase Jardiance 25 mg, 1 tablet  every morning  - Continue Metformin 500 mg 1 tablet with Breakfast and 1 tablet with supper - Continue  Glipizide 5 mg, 1 tablet before breakfast  - Continue Trulicity  3 mg once a week        - HOW TO TREAT LOW BLOOD SUGARS (Blood sugar LESS THAN 70 MG/DL) Please follow the RULE OF 15 for the treatment of hypoglycemia treatment (when your (blood sugars are less than 70 mg/dL)   STEP 1: Take 15 grams of carbohydrates when your blood sugar is low, which includes:  3-4 GLUCOSE TABS  OR 3-4 OZ OF JUICE OR REGULAR SODA OR ONE TUBE OF GLUCOSE GEL    STEP 2: RECHECK blood sugar in 15 MINUTES STEP 3: If your blood sugar is still low at the 15 minute recheck --> then, go back to STEP 1 and treat AGAIN with another 15 grams of carbohydrates.   24-Hour Urine Collection  You will be collecting your urine for a 24-hour period of time. Your timer starts with your first urine of the morning (For example - If you first pee at [place], your timer will start at [place]) [place] away your first urine of the morning Collect your urine every time you pee for the next 24 hours STOP your urine collection 24 hours after you started the collection (For example - You would stop at 9AM the day after you started)

## 2022-05-27 NOTE — Progress Notes (Unsigned)
Name: Johnny [name]  Age/ Sex: 67 y.o., male   MRN/ DOB: [id number], 02-Mar-1956     PCP: Johnny Koch, MD   Reason for Endocrinology Evaluation: Type 2 Diabetes Mellitus  Initial Endocrine Consultative Visit: 08/20/2018    PATIENT IDENTIFIER: Johnny [name] is a 67 y.o. male with a past medical history of T2DM, psoriasis, HTN and OSA (Dx 08/2019) . The patient has followed with Endocrinology clinic since 08/20/2018 for consultative assistance with management of his diabetes.     DIABETIC HISTORY:  Johnny [name] was diagnosed with T2DM in 2012,. He was on Metformin,Farxiga and Glimepiride in the past. He has never been on insulin. His hemoglobin A1c has ranged from 9.0 % in 2013, peaking at 13.5% in 06/2018.  On his initial visit to our clinic his A1c was 13.5%. He was not taking any of his meds. He was restarted on Metformin and glipizide.   Trulicity was started 46/2703    ADRENAL HISTORY: Upon review of his chart he was noted to have bilateral adrenal nodules on CT abdomen and pelvis from 03/2020.  Right adrenal nodule 3.3 cm and left adrenal nodule 2.8 cm  He did have normal renin and Aldo during evaluation for uncontrolled HTN in 09/2018   SUBJECTIVE:   During the last visit (06/28/2021): A1c 7.5 %.  we continued metformin and Glipizide and increased Trulicity   Today (5/00/9381): Johnny [name] is here for a  follow up on his diabetes.  He checks his blood sugars 2 times daily, preprandial to breakfast and supper. The patient has not hypoglycemic episodes since the last clinic visit.   He has been noted with weight gain  Follows with Podiatry - Johnny [name] , last follow-up 04/09/2022  Denies nausea, vomiting or diarrhea  LE edema variable  Denies palpitations  Denies panic attacks or anxiety  Denies dizziness  Denies genital infection   He is on the CPAP   HOME DIABETES REGIMEN:  Metformin 500 mg XR 1 tablets BID with meals Glipizide 5 mg,  1 tablet daily   Jardiance 10 mg daily  Trulicity 3 mg weekly ( Sundays )     METER DOWNLOAD SUMMARY:12/25-1/23/2024  Average Number Tests/Day = 39 Overall Mean FS Glucose = 108 Standard Deviation = 22  BG Ranges: Low = 69 High = 150   Hypoglycemic Events/30 Days: BG < 50 = 0 Episodes of symptomatic severe hypoglycemia = 0     DIABETIC COMPLICATIONS: Microvascular complications:  Neuropathy, CKD Denies: retinopathy  Last eye exam: Completed 05/2020   Macrovascular complications:    Denies: CAD, PVD, CVA     HISTORY:  Past Medical History:  Past Medical History:  Diagnosis Date   CKD (chronic kidney disease) stage 3, GFR 30-59 ml/min ([place]) 06/10/2017   Constipation 03/27/2020   Diabetes mellitus    Dysrhythmia    A flutter   Essential hypertension 08/03/2007   Qualifier: Diagnosis of  By: Linda Hedges MD, Heinz Knuckles  Med ARB, diuretic     Guttate psoriasis    Hyperlipidemia associated with type 2 diabetes mellitus ([place]) 08/03/2007   Qualifier: Diagnosis of  By: Linda Hedges MD, Heinz Knuckles  meds - none    Paroxysmal atrial flutter ([place])    S/P TEE/DCCV 07/2019   PSORIASIS, 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   Qualifier: Diagnosis of  By: Linda Hedges MD, Heinz Knuckles    Sleep apnea    Type II diabetes mellitus with renal manifestations, uncontrolled 08/03/2007   Qualifier: Diagnosis of  By:  Norins MD, Heinz Knuckles  Med metformin    Past Surgical History:  Past Surgical History:  Procedure Laterality Date   CARDIOVERSION N/A 07/18/2019   Procedure: CARDIOVERSION;  Surgeon: Dorothy Spark, MD;  Location: [facility] ENDOSCOPY;  Service: Cardiovascular;  Laterality: N/A;   TEE WITHOUT CARDIOVERSION N/A 07/18/2019   Procedure: TRANSESOPHAGEAL ECHOCARDIOGRAM (TEE);  Surgeon: Dorothy Spark, MD;  Location: [facility] ENDOSCOPY;  Service: Cardiovascular;  Laterality: N/A;   TOOTH EXTRACTION     XI ROBOTIC ASSISTED SIMPLE PROSTATECTOMY N/A 05/31/2020   Procedure: XI ROBOTIC ASSISTED SIMPLE PROSTATECTOMY-SUPRAPUBIC;  Surgeon: Cleon Gustin, MD;  Location: [facility] ORS;  Service: Urology;  Laterality: N/A;   Social History:  reports that he has never smoked. He has been exposed to tobacco smoke. He has never used smokeless tobacco. He reports that he does not drink alcohol and does not use drugs. Family History:  Family History  Problem Relation Age of Onset   Hypertension Father        DOB [phone]   Hyperlipidemia Father    Diabetes Father    Coronary artery disease Father        with stents   Nephrolithiasis Father    Kidney disease Father        s/p rejected kidney transplant; on HD   Memory loss Mother        DOB 45   Kidney disease Mother        s/p nephrectomy, infection problem   Dementia Mother    Cancer Brother        lung   HIV Brother    Prostate cancer Neg Hx    Colon cancer Neg Hx      HOME MEDICATIONS: Allergies as of 05/27/2022   No Known Allergies      Medication List        Accurate as of May 27, 2022  3:12 PM. If you have any questions, ask your nurse or doctor.          ammonium lactate 12 % lotion Commonly known as: AmLactin Apply 1 application topically as needed for dry skin.   atorvastatin 80 MG tablet Commonly known as: LIPITOR TAKE 1 TABLET BY MOUTH EVERY DAY   carvedilol 25 MG tablet Commonly known as: COREG Take 1 tablet (25 mg total) by mouth 2 (two) times daily with a meal.   Eliquis 5 MG Tabs tablet Generic drug: apixaban TAKE 1 TABLET BY MOUTH TWICE A DAY   empagliflozin 25 MG Tabs tablet Commonly known as: Jardiance Take 1 tablet (25 mg total) by mouth daily before breakfast. What changed:  medication strength how much to take Changed by: Dorita Sciara, MD   finasteride 5 MG tablet Commonly known as: PROSCAR Take 1 tablet (5 mg total) by mouth daily.   fluticasone 50 MCG/ACT nasal spray Commonly known as: FLONASE SPRAY 1 SPRAY INTO BOTH NOSTRILS DAILY.   glipiZIDE 5 MG tablet Commonly known as: GLUCOTROL Take 1 tablet (5 mg total)  by mouth daily before breakfast.   hydrALAZINE 50 MG tablet Commonly known as: APRESOLINE Take 1.5 tablets (75 mg total) by mouth 3 (three) times daily.   isosorbide mononitrate 60 MG 24 hr tablet Commonly known as: IMDUR Take 1 tablet (60 mg total) by mouth daily.   metFORMIN 500 MG 24 hr tablet Commonly known as: GLUCOPHAGE-XR Take 1 tablet (500 mg total) by mouth 2 (two) times daily.   olmesartan-hydrochlorothiazide 40-12.5 MG tablet Commonly known as: BENICAR HCT  TAKE 1 TABLET BY MOUTH EVERY DAY   Trulicity 3 HY/8.5OY Sopn Generic drug: Dulaglutide Inject 3 mg as directed once a week.         OBJECTIVE:   Vital Signs: BP (!) 154/78   Pulse 80   Ht '6\' 5"'$  (1.956 m)   Wt (!) 311 lb (141.1 kg)   SpO2 95%   BMI 36.88 kg/m   Wt Readings from Last 3 Encounters:  05/27/22 (!) 311 lb (141.1 kg)  02/06/22 (!) 308 lb 12.8 oz (140.1 kg)  01/23/22 (!) 306 lb 9.6 oz (139.1 kg)     Exam: General: Pt appears well and is in NAD  Lungs: Clear with good BS bilat with no rales, rhonchi, or wheezes  Heart: RRR with normal S1 and S2 and no gallops; no murmurs; no rub  Extremities: no pretibial edema.  Neuro: MS is good with appropriate affect, pt is alert and Ox3    DM foot exam: 04/09/2022 per podiatry   DATA REVIEWED:  Lab Results  Component Value Date   HGBA1C 7.8 (A) 05/27/2022   HGBA1C 7.5 (A) 12/27/2021   HGBA1C 7.5 (A) 06/28/2021    Latest Reference Range & Units 05/27/22 14:47  Sodium 135 - 145 mEq/L 142  Potassium 3.5 - 5.1 mEq/L 4.6  Chloride 96 - 112 mEq/L 107  CO2 19 - 32 mEq/L 27  Glucose 70 - 99 mg/dL 87  BUN 6 - 23 mg/dL 21  Creatinine 0.40 - 1.50 mg/dL 2.05 (H)  Calcium 8.4 - 10.5 mg/dL 9.2  GFR >60.00 mL/min 33.69 (L)    Latest Reference Range & Units 05/27/22 14:47  Total CHOL/HDL Ratio  3  Cholesterol 0 - 200 mg/dL 105  HDL Cholesterol >39.00 mg/dL 35.50 (L)  LDL (calc) 0 - 99 mg/dL 52  MICROALB/CREAT RATIO 0.0 - 30.0 mg/g 5.0  NonHDL   69.61  Triglycerides 0.0 - 149.0 mg/dL 86.0  VLDL 0.0 - 40.0 mg/dL 17.2  (L): Data is abnormally low    CT abdomen 03/27/2020  FINDINGS: Lower chest: Lung bases demonstrate no acute consolidation or effusion. Hazy right subpleural density probably reflects atelectasis.   Hepatobiliary: No focal liver abnormality is seen. No gallstones, gallbladder wall thickening, or biliary dilatation.   Pancreas: Unremarkable. No pancreatic ductal dilatation or surrounding inflammatory changes.   Spleen: Normal in size without focal abnormality.   Adrenals/Urinary Tract: Nodular thickening of adrenal glands. 3.3 cm hypodense right adrenal nodule. 2.8 cm hypodense left adrenal nodule. Kidneys show mild renal pelvis enlargement and hydroureter but no stones. Moderate perinephric edema and fluid, nonspecific. Bladder is decompressed by Foley catheter.   Stomach/Bowel: The stomach is nonenlarged. No dilated small bowel. Negative appendix. Large amount of stool in the colon without wall thickening.   Vascular/Lymphatic: Moderate aortic atherosclerosis. No aneurysm. No suspicious nodes.   Reproductive: Markedly enlarged prostate gland.   Other: No free air. Bilateral small inguinal hernias containing fat on the left and fat and small fluid on the right.   Musculoskeletal: No acute or significant osseous findings.    ASSESSMENT / PLAN / RECOMMENDATIONS:   1) Type 2 Diabetes Mellitus, Sub- Optimally controlled, With CKD III and neuropathic  complications - Most recent A1c of 7.8 %. Goal A1c < 7.0 %.  -A1c is skewed, due to CKD  -His glucose meter download shows optimal BG's, will check fructosamine  - CBC shows no evidence of anemia - GFR trending down,will encourage hydration  -Tolerating Jardiance, will increase as below -GFR below  35, will stop metformin  MEDICATIONS: Stop metformin Increase  Jardiance 25 mg daily Continue glipizide  5 mg, 1 Tablet before Breakfast  Continue  Trulicity 3.0  mg weekly     EDUCATION / INSTRUCTIONS: BG monitoring instructions: Patient is instructed to check his blood sugars 2 times a day, fasting and bedtime. Call [facility] if: BG persistently < 70  I reviewed the Rule of 15 for the treatment of hypoglycemia in detail with the patient. Literature supplied.   2.  Bilateral adrenal nodules:   -Work up negative 06/2021  -Will proceed with adrenal imaging for MRI -Will proceed with Aldo/renin/24-hour urinary cortisol and catecholamines -We will repeat imaging of adrenal adenoma     3. Microalbuminuria :  -Resolved -Continue olmesartan-HCTZ     F/U in 6 months    Signed electronically by: Mack Guise, MD  [facility] Endocrinology  [address] [place], [place] [address] Phone: [phone] FAX: [phone]   CC: Johnny [name], [place] [address] Phone: [phone]  Fax: [phone]  Return to Endocrinology clinic as below: Future Appointments  Date Time Provider [place]  05/28/2022  2:50 PM McKenzie, Candee Furbish, MD [facility] None  06/04/2022  3:00 PM Sueanne Margarita, MD [facility]  07/09/2022  3:15 PM Felipa Furnace, DPM [facility]  12/01/2022  3:00 PM Sachin Ferencz, Melanie Crazier, MD [facility] None  01/16/2023  2:00 PM Johnny Koch, MD [facility] None

## 2022-05-28 ENCOUNTER — Encounter: Payer: Self-pay | Admitting: Urology

## 2022-05-28 ENCOUNTER — Ambulatory Visit: Payer: BC Managed Care – PPO | Admitting: Urology

## 2022-05-28 ENCOUNTER — Encounter: Payer: Self-pay | Admitting: Internal Medicine

## 2022-05-28 VITALS — BP 143/74 | HR 93

## 2022-05-28 DIAGNOSIS — N138 Other obstructive and reflux uropathy: Secondary | ICD-10-CM

## 2022-05-28 DIAGNOSIS — N401 Enlarged prostate with lower urinary tract symptoms: Secondary | ICD-10-CM | POA: Diagnosis not present

## 2022-05-28 DIAGNOSIS — R351 Nocturia: Secondary | ICD-10-CM | POA: Diagnosis not present

## 2022-05-28 NOTE — Progress Notes (Signed)
05/28/2022 3:19 PM   Johnny [name] 25-Dec-1955 [id number]  Referring provider: [name], MD [place],  [place] [address]  Followup BPH   HPI: Mr Johnny [name] is a 67yo here for followup for BPH and urinary retention. He is doing well after simple prostatectomy. IPSS 0 QOL 0. Urine stream strong. No nocturia. No toher complaints today. 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   Qualifier: Diagnosis of  By: Linda Hedges MD, Heinz Knuckles    Sleep apnea    Type II diabetes mellitus with renal manifestations, uncontrolled 08/03/2007   Qualifier: Diagnosis of  By: Linda Hedges MD, Heinz Knuckles  Med metformin     Surgical History: Past Surgical History:  Procedure Laterality Date   CARDIOVERSION N/A 07/18/2019   Procedure: CARDIOVERSION;  Surgeon: Dorothy Spark, MD;  Location: [place];  Service: Cardiovascular;  Laterality: N/A;   TEE WITHOUT CARDIOVERSION N/A 07/18/2019   Procedure: TRANSESOPHAGEAL ECHOCARDIOGRAM (TEE);  Surgeon: Dorothy Spark, MD;  Location: [facility] ENDOSCOPY;  Service: Cardiovascular;  Laterality: N/A;   TOOTH EXTRACTION     XI ROBOTIC ASSISTED SIMPLE PROSTATECTOMY N/A 05/31/2020   Procedure: XI ROBOTIC ASSISTED SIMPLE PROSTATECTOMY-SUPRAPUBIC;  Surgeon: Cleon Gustin, MD;  Location: [facility] ORS;  Service: Urology;  Laterality: N/A;    Home Medications:  Allergies as of 05/28/2022   No Known Allergies      Medication List         Accurate as of May 28, 2022  3:19 PM. If you have any questions, ask your nurse or doctor.          ammonium lactate 12 % lotion Commonly known as: AmLactin Apply 1 application topically as needed for dry skin.   atorvastatin 80 MG tablet Commonly known as: LIPITOR TAKE 1 TABLET BY MOUTH EVERY DAY   carvedilol 25 MG tablet Commonly known as: COREG Take 1 tablet (25 mg total) by mouth 2 (two) times daily with a meal.   Eliquis 5 MG Tabs tablet Generic drug: apixaban TAKE 1 TABLET BY MOUTH TWICE A DAY   empagliflozin 25 MG Tabs tablet Commonly known as: Jardiance Take 1 tablet (25 mg total) by mouth daily before breakfast.   finasteride 5 MG tablet Commonly known as: PROSCAR Take 1 tablet (5 mg total) by mouth daily.   fluticasone 50 MCG/ACT nasal spray Commonly known as: FLONASE SPRAY 1 SPRAY INTO BOTH NOSTRILS DAILY.   glipiZIDE 5 MG tablet Commonly known as: GLUCOTROL Take 1 tablet (5 mg total) by mouth daily before breakfast.   hydrALAZINE 50 MG tablet Commonly known as: APRESOLINE Take 1.5 tablets (75 mg total) by mouth 3 (three) times daily.   isosorbide mononitrate 60 MG 24 hr tablet Commonly known as: IMDUR Take 1 tablet (60 mg total) by mouth daily.   metFORMIN 500 MG 24 hr tablet Commonly known as: GLUCOPHAGE-XR Take 1  tablet (500 mg total) by mouth 2 (two) times daily.   olmesartan-hydrochlorothiazide 40-12.5 MG tablet Commonly known as: BENICAR HCT TAKE 1 TABLET BY MOUTH EVERY DAY   Trulicity 3 QM/2.5OI Sopn Generic drug: Dulaglutide Inject 3 mg as directed once a week.        Allergies: No Known Allergies  Family History: Family History  Problem Relation Age of Onset   Hypertension Father        DOB [phone]   Hyperlipidemia Father    Diabetes Father    Coronary artery disease Father        with stents   Nephrolithiasis Father    Kidney disease Father        s/p rejected kidney transplant; on HD   Memory loss Mother         DOB 67   Kidney disease Mother        s/p nephrectomy, infection problem   Dementia Mother    Cancer Brother        lung   HIV Brother    Prostate cancer Neg Hx    Colon cancer Neg Hx     Social History:  reports that he has never smoked. He has been exposed to tobacco smoke. He has never used smokeless tobacco. He reports that he does not drink alcohol and does not use drugs.  ROS: All other review of systems were reviewed and are negative except what is noted above in HPI  Physical Exam: BP (!) 143/74   Pulse 93   Constitutional:  Alert and oriented, No acute distress. HEENT:  AT, moist mucus membranes.  Trachea midline, no masses. Cardiovascular: No clubbing, cyanosis, or edema. Respiratory: Normal respiratory effort, no increased work of breathing. GI: Abdomen is soft, nontender, nondistended, no abdominal masses GU: No CVA tenderness.  Lymph: No cervical or inguinal lymphadenopathy. Skin: No rashes, bruises or suspicious lesions. Neurologic: Grossly intact, no focal deficits, moving all 4 extremities. Psychiatric: Normal mood and affect.  Laboratory Data: Lab Results  Component Value Date   WBC 4.5 05/27/2022   HGB 13.9 05/27/2022   HCT 42.6 05/27/2022   MCV 89.8 05/27/2022   PLT 248.0 05/27/2022    Lab Results  Component Value Date   CREATININE 2.05 (H) 05/27/2022    Lab Results  Component Value Date   PSA 11.14 (H) 11/19/2012   PSA 7.47 (H) 11/04/2011   PSA 3.43 04/23/2009    No results found for: "TESTOSTERONE"  Lab Results  Component Value Date   HGBA1C 7.8 (A) 05/27/2022    Urinalysis    Component Value Date/Time   COLORURINE YELLOW 03/28/2020 1805   APPEARANCEUR Clear 05/27/2021 1210   LABSPEC 1.016 03/28/2020 1805   PHURINE 5.0 03/28/2020 1805   GLUCOSEU Negative 05/27/2021 1210   GLUCOSEU >=1000 04/23/2009 0911   HGBUR LARGE (A) 03/28/2020 1805   BILIRUBINUR Negative 05/27/2021 [place] 03/28/2020 1805    PROTEINUR 2+ (A) 05/27/2021 1210   PROTEINUR 100 (A) 03/28/2020 1805   UROBILINOGEN negative (A) 11/04/2019 1419   UROBILINOGEN 0.2 04/23/2009 0911   NITRITE Negative 05/27/2021 1210   NITRITE NEGATIVE 03/28/2020 1805   LEUKOCYTESUR 1+ (A) 05/27/2021 1210   LEUKOCYTESUR NEGATIVE 03/28/2020 1805    Lab Results  Component Value Date   LABMICR 53.6 06/28/2021   WBCUA 11-30 (A) 05/27/2021   LABEPIT 0-10 05/27/2021   MUCUS Present 05/27/2021   BACTERIA Moderate (A) 05/27/2021    Pertinent Imaging:  Results for orders placed  during the hospital encounter of 09/20/19  DG Abdomen 1 View  Narrative CLINICAL DATA:  Constipation and trouble breathing  EXAM: ABDOMEN - 1 VIEW  COMPARISON:  None.  FINDINGS: The bowel gas pattern is normal. No abnormal stool retention. Pelvic calcifications on the right have the appearance of phlebolith. No abnormal osseous findings.  IMPRESSION: Normal bowel gas pattern.  No abnormal stool retention.   Electronically Signed By: Monte Fantasia M.D. On: 09/20/2019 05:09  No results found for this or any previous visit.  No results found for this or any previous visit.  No results found for this or any previous visit.  No results found for this or any previous visit.  No valid procedures specified. No results found for this or any previous visit.  No results found for this or any previous visit.   Assessment & Plan:    1. BPH with urinary obstruction PSA today -followup 1 year  - Urinalysis, Routine w reflex microscopic   No follow-ups on file.  Nicolette Bang, MD  [facility] Urology [place]

## 2022-05-28 NOTE — Patient Instructions (Signed)

## 2022-05-29 ENCOUNTER — Telehealth: Payer: Self-pay | Admitting: Internal Medicine

## 2022-05-29 LAB — PSA: Prostate Specific Ag, Serum: 1.2 ng/mL (ref 0.0–4.0)

## 2022-05-29 LAB — URINALYSIS, ROUTINE W REFLEX MICROSCOPIC
Bilirubin, UA: NEGATIVE
Leukocytes,UA: NEGATIVE
Nitrite, UA: NEGATIVE
RBC, UA: NEGATIVE
Specific Gravity, UA: 1.02 (ref 1.005–1.030)
Urobilinogen, Ur: 0.2 mg/dL (ref 0.2–1.0)
pH, UA: 5 (ref 5.0–7.5)

## 2022-05-29 LAB — MICROSCOPIC EXAMINATION: Bacteria, UA: NONE SEEN

## 2022-05-29 NOTE — Telephone Encounter (Signed)
Please let the patient know that his kidneys shows that he is dehydrated, please encourage the patient to increase his water intake Because his kidney filtration rate is below 35, he will need to stop metformin altogether  I have already increased his Jardiance, continue Trulicity, and continue glipizide   His urine shows that he is not leaking any extra protein in the urine, which is a good sign

## 2022-05-29 NOTE — Telephone Encounter (Signed)
LMTCB

## 2022-05-30 LAB — ALDOSTERONE + RENIN ACTIVITY W/ RATIO
Aldos/Renin Ratio: 2.1 (ref 0.0–30.0)
Aldosterone: 6.7 ng/dL (ref 0.0–30.0)
Renin Activity, Plasma: 3.181 ng/mL/hr (ref 0.167–5.380)

## 2022-05-30 LAB — FRUCTOSAMINE: Fructosamine: 312 umol/L — ABNORMAL HIGH (ref 205–285)

## 2022-05-30 NOTE — Telephone Encounter (Signed)
Patient advised and verbalized understanding 

## 2022-06-02 ENCOUNTER — Other Ambulatory Visit: Payer: BC Managed Care – PPO

## 2022-06-02 DIAGNOSIS — D3501 Benign neoplasm of right adrenal gland: Secondary | ICD-10-CM

## 2022-06-04 ENCOUNTER — Ambulatory Visit: Payer: BC Managed Care – PPO | Attending: Cardiology | Admitting: Cardiology

## 2022-06-04 ENCOUNTER — Encounter: Payer: Self-pay | Admitting: Cardiology

## 2022-06-04 VITALS — BP 136/74 | HR 80 | Ht 77.0 in | Wt 312.0 lb

## 2022-06-04 DIAGNOSIS — I10 Essential (primary) hypertension: Secondary | ICD-10-CM | POA: Diagnosis not present

## 2022-06-04 DIAGNOSIS — I4892 Unspecified atrial flutter: Secondary | ICD-10-CM | POA: Diagnosis not present

## 2022-06-04 DIAGNOSIS — G4733 Obstructive sleep apnea (adult) (pediatric): Secondary | ICD-10-CM | POA: Diagnosis not present

## 2022-06-04 NOTE — Progress Notes (Signed)
Cardiology Office Note    Date:  06/04/2022   ID:  Johnny [name], DOB 09-Nov-1955, MRN [id number]  PCP:  [name], MD  Cardiologist:  Fransico Him, MD   Chief Complaint  Patient presents with   Sleep Apnea     History of Present Illness:  Johnny [name] is a 67 y.o. 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  He also has a hx of medical noncompliance.  His last 2D echo 08/2019 showed normal LVF with mild LVH and moderate biatrial enlargement. He has a hx of PAFlutter requiring TEE/DCCV in the past.   He underwent sleep study due to atrial flutter which showed severe OSA with an AHI of 92/hr and nocturnal hypoxemia with O2 sats as low as 85%.  He underwent CPAP titration but due to ongoing events he was transitioned to auto BiPAP   He is here today for followup and is doing well.  He denies any chest pain or pressure, SOB, DOE, PND, orthopnea, LE edema (except if on his feet for a long time), dizziness, palpitations or syncope. He is compliant with his meds and is tolerating meds with no SE.    He is doing well with his PAP device and thinks that he has gotten used to it.  He tolerates the mask and feels the pressure is adequate.  Since going on PAP he feels rested in the am and has no significant daytime sleepiness.  He denies any significant mouth or nasal dryness or nasal congestion.  His mouth dryness has resolved after starting the chin strap.  He does not think that he snores.    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   Qualifier: Diagnosis of  By: Linda Hedges MD, Heinz Knuckles    Sleep apnea    Type II diabetes mellitus with renal manifestations, uncontrolled 08/03/2007   Qualifier: Diagnosis of  By: Linda Hedges MD, Heinz Knuckles  Med metformin     Past Surgical History:  Procedure Laterality Date   CARDIOVERSION N/A 07/18/2019   Procedure: CARDIOVERSION;  Surgeon: Dorothy Spark, MD;  Location: [facility];  Service: Cardiovascular;  Laterality: N/A;   TEE WITHOUT CARDIOVERSION N/A 07/18/2019   Procedure: TRANSESOPHAGEAL ECHOCARDIOGRAM (TEE);  Surgeon: Dorothy Spark, MD;  Location: [facility] ENDOSCOPY;  Service: Cardiovascular;  Laterality: N/A;   TOOTH EXTRACTION     XI ROBOTIC ASSISTED SIMPLE PROSTATECTOMY N/A 05/31/2020   Procedure: XI ROBOTIC ASSISTED SIMPLE PROSTATECTOMY-SUPRAPUBIC;  Surgeon: Cleon Gustin, MD;  Location: [facility] ORS;  Service: Urology;  Laterality: N/A;    Current Medications: Current Meds  Medication Sig   ammonium lactate (AMLACTIN) 12 % lotion Apply 1 application topically as needed for dry skin.   apixaban (ELIQUIS) 5 MG  TABS tablet TAKE 1 TABLET BY MOUTH TWICE A DAY   atorvastatin (LIPITOR) 80 MG tablet TAKE 1 TABLET BY MOUTH EVERY DAY   carvedilol (COREG) 25 MG tablet Take 1 tablet (25 mg total) by mouth 2 (two) times daily with a meal.   Dulaglutide (TRULICITY) 3 JE/5.6DJ SOPN Inject 3 mg as directed once a week.   empagliflozin (JARDIANCE) 25 MG TABS tablet Take 1 tablet (25 mg total) by mouth daily before breakfast.   finasteride (PROSCAR) 5 MG tablet Take 1 tablet (5 mg total) by mouth daily.   fluticasone (FLONASE) 50 MCG/ACT nasal spray SPRAY 1 SPRAY INTO BOTH NOSTRILS DAILY.   glipiZIDE (GLUCOTROL) 5 MG tablet Take 1 tablet (5 mg total) by mouth daily before breakfast.   hydrALAZINE  (APRESOLINE) 50 MG tablet Take 1.5 tablets (75 mg total) by mouth 3 (three) times daily.   isosorbide mononitrate (IMDUR) 60 MG 24 hr tablet Take 1 tablet (60 mg total) by mouth daily.   olmesartan-hydrochlorothiazide (BENICAR HCT) 40-12.5 MG tablet TAKE 1 TABLET BY MOUTH EVERY DAY    Allergies:   Patient has no known allergies.   Social History   Socioeconomic History   Marital status: Married    Spouse name: Not on file   Number of children: 2   Years of education: 12   Highest education level: Not on file  Occupational History   Occupation: mfg  Tobacco Use   Smoking status: Never    Passive exposure: Yes   Smokeless tobacco: Never  Vaping Use   Vaping Use: Never used  Substance and Sexual Activity   Alcohol use: No   Drug use: No   Sexual activity: Yes  Other Topics Concern   Not on file  Social History Narrative   [employer]. [employer]. [employer] x 4 years. Married '94. 2 sons - '95, '96. Wife is healthy. Work - Banker.   Social Determinants of Radio broadcast assistant Strain: Not on file  Food Insecurity: Not on file  Transportation Needs: Not on file  Physical Activity: Not on file  Stress: Not on file  Social Connections: Not on file     Family History:  The patient's family history includes Cancer in his brother; Coronary artery disease in his father; Dementia in his mother; Diabetes in his father; HIV in his brother; Hyperlipidemia in his father; Hypertension in his father; Kidney disease in his father and mother; Memory loss in his mother; Nephrolithiasis in his father.   ROS:   Please see the history of present illness.    ROS All other systems reviewed and are negative.      No data to display          PHYSICAL EXAM:   VS:  BP 136/74   Pulse 80   Ht '6\' 5"'$  (1.956 m)   Wt (!) 312 lb (141.5 kg)   SpO2 94%   BMI 37.00 kg/m     GEN: Well nourished, well developed in no acute distress HEENT: Normal NECK: No JVD; No carotid bruits LYMPHATICS: No  lymphadenopathy CARDIAC:RRR, no murmurs, rubs, gallops RESPIRATORY:  Clear to auscultation without rales, wheezing or rhonchi  ABDOMEN: Soft, non-tender, non-distended MUSCULOSKELETAL:  No edema; No deformity  SKIN: Warm and dry NEUROLOGIC:  Alert and oriented x 3 PSYCHIATRIC:  Normal affect   Wt Readings from Last 3 Encounters:  06/04/22 (!) 312 lb (141.5 kg)  05/27/22 (!) 311 lb (141.1 kg)  02/06/22 (!) 308 lb 12.8 oz (140.1  kg)      Studies/Labs Reviewed:   EKG:  EKG is not ordered today.  2D echo 08/2019 IMPRESSIONS    1. Left ventricular ejection fraction, by estimation, is 60 to 65%. The  left ventricle has normal function. The left ventricle has no regional  wall motion abnormalities. There is mild concentric left ventricular  hypertrophy. Left ventricular diastolic  parameters are indeterminate.   2. Right ventricular systolic function is normal. The right ventricular  size is mildly enlarged. There is normal pulmonary artery systolic  pressure.   3. Left atrial size was moderately dilated.   4. Right atrial size was moderately dilated.   5. The mitral valve is normal in structure. No evidence of mitral valve  regurgitation. No evidence of mitral stenosis.   6. The aortic valve is normal in structure. Aortic valve regurgitation is  not visualized. No aortic stenosis is present.   7. Aortic dilatation noted. There is mild dilatation at the level of the  sinuses of Valsalva measuring 40 mm.   8. The inferior vena cava is normal in size with greater than 50%  respiratory variability, suggesting right atrial pressure of 3 mmHg.   Recent Labs: 05/27/2022: BUN 21; Creatinine, Ser 2.05; Hemoglobin 13.9; Platelets 248.0; Potassium 4.6; Sodium 142   Lipid Panel    Component Value Date/Time   CHOL 105 05/27/2022 1447   CHOL 117 06/28/2021 1604   TRIG 86.0 05/27/2022 1447   TRIG 63 05/01/2006 1329   HDL 35.50 (L) 05/27/2022 1447   HDL 34 (L) 06/28/2021 1604   CHOLHDL  3 05/27/2022 1447   VLDL 17.2 05/27/2022 1447   LDLCALC 52 05/27/2022 1447   LDLCALC 67 06/28/2021 1604   LDLDIRECT 170.2 11/19/2012 1105    Additional studies/ records that were reviewed today include:  Labs and PAP compliance download  EKG was not performed today   ASSESSMENT:    1. OSA (obstructive sleep apnea)   2. Essential hypertension   3. Paroxysmal atrial flutter (HCC)      PLAN:  In order of problems listed above:  1.  OSA - The patient is tolerating PAP therapy well without any problems. The PAP download performed by his DME was personally reviewed and interpreted by me today and showed an AHI of 9.3/hr on auto BiPAP  with 100% compliance in using more than 4 hours nightly.  The patient has been using and benefiting from PAP use and will continue to benefit from therapy.  -his AHI is elevated despite adding a chin strap -he says that the mask does leak some.   -I have encouraged him to try to avoid sleeping supine >>he has only been sleeping supine -I will get an appt with DME for a mask fit -get a download in 6 weeks  2.  HTN -BP is poorly controlled on exam today -Continue prescription drug management with carvedilol 25 mg twice daily, hydralazine 75 mg 3 times daily, Imdur 60 mg a day and olmesartan HCT 40-12.5 mg daily with as needed refills -I have personally reviewed and interpreted outside labs performed by patient's PCP which showed serum creatinine 2 and potassium 4.6 on 05/27/2022  3.  PAflutter -He remains in normal sinus rhythm today and denies any palpitations -He has not had any further bleeding problems on DOAC>hematuria resolved (followed by Urology) -I have personally reviewed and interpreted outside labs performed by patient's PCP which showed hemoglobin 13.9 on 05/27/2022 -Continue prescription drug management with exception 5 mg twice daily,  carvedilol 25 mg twice daily with as needed refills  4.  CKD stage 3b -followed by PCP    followup  with me in 6 months  Medication Adjustments/Labs and Tests Ordered: Current medicines are reviewed at length with the patient today.  Concerns regarding medicines are outlined above.  Medication changes, Labs and Tests ordered today are listed in the Patient Instructions below.  There are no Patient Instructions on file for this visit.   Signed, Fransico Him, MD  06/04/2022 3:23 PM    [facility] [place], [place], [place]  [address] Phone: [phone]; Fax: [phone]

## 2022-06-04 NOTE — Patient Instructions (Signed)
Medication Instructions:  Your physician recommends that you continue on your current medications as directed. Please refer to the Current Medication list given to you today.  *If you need a refill on your cardiac medications before your next appointment, please call your pharmacy*   Lab Work: None.  If you have labs (blood work) drawn today and your tests are completely normal, you will receive your results only by: [place] (if you have MyChart) OR A paper copy in the mail If you have any lab test that is abnormal or we need to change your treatment, we will call you to review the results.   Testing/Procedures: None.   Follow-Up: At [facility], you and your health needs are our priority.  As part of our continuing mission to provide you with exceptional heart care, we have created designated Provider Care Teams.  These Care Teams include your primary Cardiologist (physician) and Advanced Practice Providers (APPs -  Physician Assistants and Nurse Practitioners) who all work together to provide you with the care you need, when you need it.  We recommend signing up for the patient portal called "MyChart".  Sign up information is provided on this After Visit Summary.  MyChart is used to connect with patients for Virtual Visits (Telemedicine).  Patients are able to view lab/test results, encounter notes, upcoming appointments, etc.  Non-urgent messages can be sent to your provider as well.   To learn more about what you can do with MyChart, go to [email or web address].    Your next appointment:   1 year(s)  Provider:   Fransico Him, MD     Other Instructions Dr. Radford Pax has requested a DME appointment for you to get a mask fitting due to mask leakage. A member of our sleep team may call you to coordinate this.

## 2022-06-06 ENCOUNTER — Telehealth: Payer: Self-pay | Admitting: *Deleted

## 2022-06-06 DIAGNOSIS — I10 Essential (primary) hypertension: Secondary | ICD-10-CM

## 2022-06-06 DIAGNOSIS — G4733 Obstructive sleep apnea (adult) (pediatric): Secondary | ICD-10-CM

## 2022-06-06 NOTE — Telephone Encounter (Signed)
-----   Message from Joni Reining, RN sent at 06/04/2022  3:32 PM EST ----- Regarding: DME appt and Download Hi Nina-  Dr. Radford Pax is requesting a DME appointment for mask fitting due to leakage with the current mask.  Dr. Radford Pax would also like a download on his device in 6 weeks.  Thanks! Danae Chen

## 2022-06-06 NOTE — Telephone Encounter (Signed)
Order placed to [employer] via community message 

## 2022-06-07 LAB — CORTISOL, URINE, 24 HOUR
24 Hour urine volume (VMAHVA): 4050 mL
CREATININE, URINE: 2.74 g/(24.h) — ABNORMAL HIGH (ref 0.50–2.15)
Cortisol (Ur), Free: 23.7 mcg/24 h (ref 4.0–50.0)

## 2022-06-07 LAB — CATECHOLAMINES, FRACTIONATED, URINE, 24 HOUR
Calc Total (E+NE): 36 mcg/24 h (ref 26–121)
Creatinine, Urine mg/day-CATEUR: 2.67 g/(24.h) — ABNORMAL HIGH (ref 0.50–2.15)
Dopamine 24 Hr Urine: 149 mcg/24 h (ref 52–480)
Norepinephrine, 24H, Ur: 36 mcg/24 h (ref 15–100)
Total Volume: 4050 mL

## 2022-06-09 ENCOUNTER — Encounter: Payer: Self-pay | Admitting: Internal Medicine

## 2022-06-22 ENCOUNTER — Other Ambulatory Visit: Payer: Self-pay | Admitting: Urology

## 2022-06-22 DIAGNOSIS — N138 Other obstructive and reflux uropathy: Secondary | ICD-10-CM

## 2022-07-03 ENCOUNTER — Ambulatory Visit
Admission: RE | Admit: 2022-07-03 | Discharge: 2022-07-03 | Disposition: A | Discharge status: Home / Self Care | Payer: BC Managed Care – PPO | Source: Ambulatory Visit | Attending: Internal Medicine | Admitting: Internal Medicine

## 2022-07-03 DIAGNOSIS — D3501 Benign neoplasm of right adrenal gland: Secondary | ICD-10-CM

## 2022-07-03 MED ORDER — GADOPICLENOL 0.5 MMOL/ML IV SOLN
10.0000 mL | Freq: Once | INTRAVENOUS | Status: AC | PRN
  Administered 2022-07-03: 10 mL via INTRAVENOUS

## 2022-07-07 ENCOUNTER — Encounter: Payer: Self-pay | Admitting: Internal Medicine

## 2022-07-09 ENCOUNTER — Ambulatory Visit (INDEPENDENT_AMBULATORY_CARE_PROVIDER_SITE_OTHER): Payer: BC Managed Care – PPO | Admitting: Podiatry

## 2022-07-09 DIAGNOSIS — B351 Tinea unguium: Secondary | ICD-10-CM

## 2022-07-09 DIAGNOSIS — E1142 Type 2 diabetes mellitus with diabetic polyneuropathy: Secondary | ICD-10-CM | POA: Diagnosis not present

## 2022-07-09 DIAGNOSIS — M79674 Pain in right toe(s): Secondary | ICD-10-CM

## 2022-07-09 DIAGNOSIS — M79675 Pain in left toe(s): Secondary | ICD-10-CM

## 2022-07-09 NOTE — Progress Notes (Signed)
  Subjective:  Patient ID: Johnny [name], male    DOB: 07/24/1955,  MRN: [id number]  Chief Complaint  Patient presents with   Nail Problem    Nail trim    67 y.o. male returns for the above complaint.  Patient presents with thickened elongated dystrophic toenails x10.  Patient is a painful to walk on.  Patient would like to have them debrided down as he is not able to do it himself.  Patient is a diabetic with last A1c of 7.6.  He does not have any secondary complaints.  Objective:  There were no vitals filed for this visit. Podiatric Exam: Vascular: dorsalis pedis and posterior tibial pulses are palpable bilateral. Capillary return is immediate. Temperature gradient is WNL. Skin turgor WNL  Sensorium: Normal Semmes Weinstein monofilament test. Normal tactile sensation bilaterally. Nail Exam: Pt has thick disfigured discolored nails with subungual debris noted bilateral entire nail hallux through fifth toenails.  Pain on palpation to the nails. Ulcer Exam: There is no evidence of ulcer or pre-ulcerative changes or infection. Orthopedic Exam: Muscle tone and strength are WNL. No limitations in general ROM. No crepitus or effusions noted. HAV  B/L.  Hammer toes 2-5  B/L. Skin: No Porokeratosis. No infection or ulcers.  Bilateral severe xerosis noted to bilateral lower extremity    Assessment & Plan:   1. Pain due to onychomycosis of toenails of both feet   2. Type 2 diabetes mellitus with diabetic polyneuropathy, without long-term current use of insulin ([place])          Patient was evaluated and treated and all questions answered.  Xerosis skin severe bilateral lower extremity -Clinically improved   Onychomycosis with pain  -Nails palliatively debrided as below. -Educated on self-care  Procedure: Nail Debridement Rationale: pain  Type of Debridement: manual, sharp debridement. Instrumentation: Nail nipper, rotary burr. Number of Nails: 10  Procedures and Treatment:  Consent by patient was obtained for treatment procedures. The patient understood the discussion of treatment and procedures well. All questions were answered thoroughly reviewed. Debridement of mycotic and hypertrophic toenails, 1 through 5 bilateral and clearing of subungual debris. No ulceration, no infection noted.  Return Visit-Office Procedure: Patient instructed to return to the office for a follow up visit 3 months for continued evaluation and treatment.  Boneta Lucks, DPM    No follow-ups on file.

## 2022-08-30 ENCOUNTER — Other Ambulatory Visit: Payer: Self-pay | Admitting: Internal Medicine

## 2022-09-13 ENCOUNTER — Other Ambulatory Visit: Payer: Self-pay | Admitting: Cardiology

## 2022-09-13 DIAGNOSIS — I4892 Unspecified atrial flutter: Secondary | ICD-10-CM

## 2022-09-15 NOTE — Telephone Encounter (Signed)
Prescription refill request for Eliquis received. Indication:Aflutter Last office visit:1/24 Scr:2.0 Age: 67 Weight:141.5  kg  Prescription refilled

## 2022-09-25 ENCOUNTER — Other Ambulatory Visit: Payer: Self-pay | Admitting: Internal Medicine

## 2022-09-30 ENCOUNTER — Other Ambulatory Visit: Payer: Self-pay | Admitting: Internal Medicine

## 2022-10-15 ENCOUNTER — Ambulatory Visit (INDEPENDENT_AMBULATORY_CARE_PROVIDER_SITE_OTHER): Payer: BC Managed Care – PPO | Admitting: Podiatry

## 2022-10-15 DIAGNOSIS — E1142 Type 2 diabetes mellitus with diabetic polyneuropathy: Secondary | ICD-10-CM

## 2022-10-15 DIAGNOSIS — M79674 Pain in right toe(s): Secondary | ICD-10-CM | POA: Diagnosis not present

## 2022-10-15 DIAGNOSIS — B351 Tinea unguium: Secondary | ICD-10-CM | POA: Diagnosis not present

## 2022-10-15 DIAGNOSIS — M79675 Pain in left toe(s): Secondary | ICD-10-CM

## 2022-10-15 NOTE — Progress Notes (Signed)
  Subjective:  Patient ID: Johnny [name], male    DOB: [birth year],  MRN: [id number]  Chief Complaint  Patient presents with   Nail Problem    Nail trim    67 y.o. male returns for the above complaint.  Patient presents with thickened elongated dystrophic toenails x10.  Patient is a painful to walk on.  Patient would like to have them debrided down as he is not able to do it himself.  Patient is a diabetic with last A1c of 7.6.  He does not have any secondary complaints.  Objective:  There were no vitals filed for this visit. Podiatric Exam: Vascular: dorsalis pedis and posterior tibial pulses are palpable bilateral. Capillary return is immediate. Temperature gradient is WNL. Skin turgor WNL  Sensorium: Normal Semmes Weinstein monofilament test. Normal tactile sensation bilaterally. Nail Exam: Pt has thick disfigured discolored nails with subungual debris noted bilateral entire nail hallux through fifth toenails.  Pain on palpation to the nails. Ulcer Exam: There is no evidence of ulcer or pre-ulcerative changes or infection. Orthopedic Exam: Muscle tone and strength are WNL. No limitations in general ROM. No crepitus or effusions noted. HAV  B/L.  Hammer toes 2-5  B/L. Skin: No Porokeratosis. No infection or ulcers.  Bilateral severe xerosis noted to bilateral lower extremity    Assessment & Plan:   No diagnosis found.        Patient was evaluated and treated and all questions answered.  Xerosis skin severe bilateral lower extremity -Clinically improved   Onychomycosis with pain  -Nails palliatively debrided as below. -Educated on self-care  Procedure: Nail Debridement Rationale: pain  Type of Debridement: manual, sharp debridement. Instrumentation: Nail nipper, rotary burr. Number of Nails: 10  Procedures and Treatment: Consent by patient was obtained for treatment procedures. The patient understood the discussion of treatment and procedures well. All questions were  answered thoroughly reviewed. Debridement of mycotic and hypertrophic toenails, 1 through 5 bilateral and clearing of subungual debris. No ulceration, no infection noted.  Return Visit-Office Procedure: Patient instructed to return to the office for a follow up visit 3 months for continued evaluation and treatment.  Nicholes Rough, DPM    No follow-ups on file.

## 2022-10-21 ENCOUNTER — Other Ambulatory Visit: Payer: Self-pay

## 2022-10-21 MED ORDER — EMPAGLIFLOZIN 25 MG PO TABS: 25.0000 mg | ORAL_TABLET | Freq: Every day | ORAL | 3 refills | Status: DC

## 2022-11-16 ENCOUNTER — Other Ambulatory Visit: Payer: Self-pay | Admitting: Cardiology

## 2022-11-30 ENCOUNTER — Other Ambulatory Visit: Payer: Self-pay | Admitting: Internal Medicine

## 2022-12-01 ENCOUNTER — Encounter: Payer: Self-pay | Admitting: Internal Medicine

## 2022-12-01 ENCOUNTER — Ambulatory Visit: Payer: BC Managed Care – PPO | Admitting: Internal Medicine

## 2022-12-01 VITALS — BP 126/78 | HR 84 | Ht 77.0 in | Wt 316.0 lb

## 2022-12-01 DIAGNOSIS — D3501 Benign neoplasm of right adrenal gland: Secondary | ICD-10-CM | POA: Diagnosis not present

## 2022-12-01 DIAGNOSIS — E1142 Type 2 diabetes mellitus with diabetic polyneuropathy: Secondary | ICD-10-CM | POA: Diagnosis not present

## 2022-12-01 DIAGNOSIS — E1165 Type 2 diabetes mellitus with hyperglycemia: Secondary | ICD-10-CM | POA: Diagnosis not present

## 2022-12-01 DIAGNOSIS — E1122 Type 2 diabetes mellitus with diabetic chronic kidney disease: Secondary | ICD-10-CM | POA: Diagnosis not present

## 2022-12-01 DIAGNOSIS — N1831 Chronic kidney disease, stage 3a: Secondary | ICD-10-CM

## 2022-12-01 DIAGNOSIS — Z7984 Long term (current) use of oral hypoglycemic drugs: Secondary | ICD-10-CM | POA: Diagnosis not present

## 2022-12-01 DIAGNOSIS — D3502 Benign neoplasm of left adrenal gland: Secondary | ICD-10-CM

## 2022-12-01 DIAGNOSIS — Z7985 Long-term (current) use of injectable non-insulin antidiabetic drugs: Secondary | ICD-10-CM | POA: Diagnosis not present

## 2022-12-01 LAB — POCT GLUCOSE (DEVICE FOR HOME USE): Glucose Fasting, POC: 176 mg/dL — AB (ref 70–99)

## 2022-12-01 LAB — POCT GLYCOSYLATED HEMOGLOBIN (HGB A1C): Hemoglobin A1C: 9.8 % — AB (ref 4.0–5.6)

## 2022-12-01 MED ORDER — GLIPIZIDE 5 MG PO TABS: 5.0000 mg | ORAL_TABLET | Freq: Two times a day (BID) | ORAL | 2 refills | Status: DC

## 2022-12-01 MED ORDER — TIRZEPATIDE 5 MG/0.5ML ~~LOC~~ SOAJ: 5.0000 mg | SUBCUTANEOUS | 3 refills | Status: DC

## 2022-12-01 NOTE — Progress Notes (Signed)
Name: Johnny [name]  Age/ Sex: 67 y.o., male   MRN/ DOB: [id number], July 10, 1955     PCP: Myrlene Broker, MD   Reason for Endocrinology Evaluation: Type 2 Diabetes Mellitus  Initial Endocrine Consultative Visit: 08/20/2018    PATIENT IDENTIFIER: Mr. KYMONI [name] is a 67 y.o. male with a past medical history of T2DM, psoriasis, HTN and OSA (Dx 08/2019) . The patient has followed with Endocrinology clinic since 08/20/2018 for consultative assistance with management of his diabetes.     DIABETIC HISTORY:  Mr. Witts was diagnosed with T2DM in 2012,. He was on Metformin,Farxiga and Glimepiride in the past. He has never been on insulin. His hemoglobin A1c has ranged from 9.0 % in 2013, peaking at 13.5% in 06/2018.  On his initial visit to our clinic his A1c was 13.5%. He was not taking any of his meds. He was restarted on Metformin and glipizide.   Trulicity was started 02/2020  Stop metformin due to low GFR 05/2022  ADRENAL HISTORY: Upon review of his chart he was noted to have bilateral adrenal nodules on CT abdomen and pelvis from 03/2020.  Right adrenal nodule 3.3 cm and left adrenal nodule 2.8 cm  He did have normal renin and Aldo during evaluation for uncontrolled HTN in 09/2018   SUBJECTIVE:   During the last visit (06/28/2021): A1c 7.5 %.  we continued metformin and Glipizide and increased Trulicity   Today (12/01/2022): [name] is here for a  follow up on his diabetes.  He checks his blood sugars 2 times daily, preprandial to breakfast and supper. The patient has not hypoglycemic episodes since the last clinic visit.    Follows with Podiatry - Dr. Allena Katz , last follow-up 10/15/2022 Continues to follow-up with cardiology for A-fib, OSA, and HTN 05/2022  He is on the CPAP   Denies nausea or vomiting  Denies constipation or diarrhea      HOME DIABETES REGIMEN:  Glipizide 5 mg,  1 tablet daily  Jardiance 25 mg daily  Trulicity 3 mg weekly ( Sundays )      METER DOWNLOAD SUMMARY:12/25-1/23/2024  Average Number Tests/Day = 39 Overall Mean FS Glucose = 108 Standard Deviation = 22  BG Ranges: Low = 69 High = 150   Hypoglycemic Events/30 Days: BG < 50 = 0 Episodes of symptomatic severe hypoglycemia = 0     DIABETIC COMPLICATIONS: Microvascular complications:  Neuropathy, CKD Denies: retinopathy  Last eye exam: Completed 05/2020   Macrovascular complications:    Denies: CAD, PVD, CVA     HISTORY:  Past Medical History:  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   Qualifier: Diagnosis of  By: Debby Bud MD, Rosalyn Gess    Sleep apnea    Type II diabetes mellitus with renal manifestations, uncontrolled 08/03/2007   Qualifier: Diagnosis of  By: Debby Bud MD, Rosalyn Gess  Med metformin    Past Surgical  History:  Past Surgical History:  Procedure Laterality Date   CARDIOVERSION N/A 07/18/2019   Procedure: CARDIOVERSION;  Surgeon: Lars Masson, MD;  Location: [facility] ENDOSCOPY;  Service: Cardiovascular;  Laterality: N/A;   TEE WITHOUT CARDIOVERSION N/A 07/18/2019   Procedure: TRANSESOPHAGEAL ECHOCARDIOGRAM (TEE);  Surgeon: Lars Masson, MD;  Location: [facility] ENDOSCOPY;  Service: Cardiovascular;  Laterality: N/A;   TOOTH EXTRACTION     XI ROBOTIC ASSISTED SIMPLE PROSTATECTOMY N/A 05/31/2020   Procedure: XI ROBOTIC ASSISTED SIMPLE PROSTATECTOMY-SUPRAPUBIC;  Surgeon: Malen Gauze, MD;  Location: [facility] ORS;  Service: Urology;   Laterality: N/A;   Social History:  reports that he has never smoked. He has been exposed to tobacco smoke. He has never used smokeless tobacco. He reports that he does not drink alcohol and does not use drugs. Family History:  Family History  Problem Relation Age of Onset   Hypertension Father        DOB [id number]   Hyperlipidemia Father    Diabetes Father    Coronary artery disease Father        with stents   Nephrolithiasis Father    Kidney disease Father        s/p rejected kidney transplant; on HD   Memory loss Mother        DOB 60   Kidney disease Mother        s/p nephrectomy, infection problem   Dementia Mother    Cancer Brother        lung   HIV Brother    Prostate cancer Neg Hx    Colon cancer Neg Hx      HOME MEDICATIONS: Allergies as of 12/01/2022   No Known Allergies      Medication List        Accurate as of December 01, 2022  2:57 PM. If you have any questions, ask your nurse or doctor.          ammonium lactate 12 % lotion Commonly known as: AmLactin Apply 1 application topically as needed for dry skin.   atorvastatin 80 MG tablet Commonly known as: LIPITOR TAKE 1 TABLET BY MOUTH EVERY DAY   carvedilol 25 MG tablet Commonly known as: COREG Take 1 tablet (25 mg total) by mouth 2 (two) times daily with a meal.   Eliquis 5 MG Tabs tablet Generic drug: apixaban TAKE 1 TABLET BY MOUTH TWICE A DAY   empagliflozin 25 MG Tabs tablet Commonly known as: Jardiance Take 1 tablet (25 mg total) by mouth daily before breakfast.   finasteride 5 MG tablet Commonly known as: PROSCAR TAKE 1 TABLET (5 MG TOTAL) BY MOUTH DAILY.   fluticasone 50 MCG/ACT nasal spray Commonly known as: FLONASE SPRAY 1 SPRAY INTO BOTH NOSTRILS DAILY.   glipiZIDE 5 MG tablet Commonly known as: GLUCOTROL Take 1 tablet (5 mg total) by mouth daily before breakfast.   hydrALAZINE 50 MG tablet Commonly known as: APRESOLINE Take 1.5 tablets (75 mg total) by mouth 3 (three) times  daily.   isosorbide mononitrate 60 MG 24 hr tablet Commonly known as: IMDUR TAKE 1 TABLET BY MOUTH EVERY DAY   olmesartan-hydrochlorothiazide 40-12.5 MG tablet Commonly known as: BENICAR HCT TAKE 1 TABLET BY MOUTH EVERY DAY   Trulicity 3 MG/0.5ML Sopn Generic drug: Dulaglutide Inject 3 mg as directed once a week.         OBJECTIVE:   Vital Signs: There were no vitals taken for this visit.  Wt Readings from Last 3 Encounters:  06/04/22 (!) 312 lb (141.5 kg)  05/27/22 (!) 311 lb (141.1 kg)  02/06/22 (!) 308 lb 12.8 oz (140.1 kg)     Exam: General: Pt appears well and is in NAD  Lungs: Clear with good BS bilat with no rales, rhonchi, or wheezes  Heart: RRR with normal S1 and S2 and no gallops; no murmurs; no rub  Extremities: no pretibial edema.  Neuro: MS is good with appropriate affect, pt is alert and Ox3    DM foot exam: 04/09/2022 per podiatry   DATA REVIEWED:  Lab Results  Component Value Date   HGBA1C 7.8 (A) 05/27/2022   HGBA1C 7.5 (A) 12/27/2021   HGBA1C 7.5 (A) 06/28/2021    Latest Reference Range & Units 05/27/22 14:47  Sodium 135 - 145 mEq/L 142  Potassium 3.5 - 5.1 mEq/L 4.6  Chloride 96 - 112 mEq/L 107  CO2 19 - 32 mEq/L 27  Glucose 70 - 99 mg/dL 87  BUN 6 - 23 mg/dL 21  Creatinine 1.61 - 0.96 mg/dL 0.45 (H)  Calcium 8.4 - 10.5 mg/dL 9.2  GFR >40.98 mL/min 33.69 (L)    Latest Reference Range & Units 05/27/22 14:47  Total CHOL/HDL Ratio  3  Cholesterol 0 - 200 mg/dL 119  HDL Cholesterol >14.78 mg/dL 29.56 (L)  LDL (calc) 0 - 99 mg/dL 52  MICROALB/CREAT RATIO 0.0 - 30.0 mg/g 5.0  NonHDL  69.61  Triglycerides 0.0 - 149.0 mg/dL 21.3  VLDL 0.0 - 08.6 mg/dL 57.8  (L): Data is abnormally low    MR abdomen 07/03/2022  FINDINGS: Lower chest: No acute abnormality.   Hepatobiliary: No solid liver abnormality is seen. No gallstones, gallbladder wall thickening, or biliary dilatation.   Pancreas: Unremarkable. No pancreatic ductal dilatation  or surrounding inflammatory changes.   Spleen: Normal in size without significant abnormality.   Adrenals/Urinary Tract: Unchanged, definitively benign bilateral macroscopic fat containing adrenal nodules, measuring 3.3 x 2.5 cm on the right and 2.3 x 1.8 cm on the left (series 8, image 16). Kidneys are normal, without renal calculi, solid lesion, or hydronephrosis.   Stomach/Bowel: Stomach is within normal limits. No evidence of bowel wall thickening, distention, or inflammatory changes.   Vascular/Lymphatic: No significant vascular findings are present. No enlarged abdominal lymph nodes.   Other: No abdominal wall hernia or abnormality. No ascites.   Musculoskeletal: No acute or significant osseous findings.   IMPRESSION: Unchanged, definitively benign bilateral macroscopic fat containing adrenal nodules, for which no further follow-up or characterization is required.      ASSESSMENT / PLAN / RECOMMENDATIONS:   1) Type 2 Diabetes Mellitus,Poorly  controlled, With CKD III and neuropathic  complications - Most recent A1c of 9.8 %. Goal A1c < 7.0 %.  -Patient has been noted with worsening glycemic control, his A1c has increased from 7.8% to 9.8%, patient has been on vacation and admits to dietary indiscretion as well as decreased exercise -Fructosamine confirmed elevation as well in the past -Metformin was discontinued 05/2022 due to low GFR -Will increase glipizide as below, patient is advised to decrease glipizide to 1 tablet daily if hypoglycemia occurs -I have recommended switching Trulicity to [facility]  MEDICATIONS:  Continue Jardiance 25 mg daily Increase glipizide  5 mg, 1 Tablet before Breakfast and 1 tablet before supper Switch Trulicity to Mounjaro 5 mg weekly   EDUCATION / INSTRUCTIONS: BG monitoring instructions: Patient is instructed to check his blood sugars 2 times a day, fasting and bedtime. Call [facility] if: BG persistently <  70  I  reviewed the Rule of 15 for the treatment of hypoglycemia in detail with the patient. Literature supplied.   2.  Bilateral adrenal nodules:   -Work up negative 06/2021  -Adrenal imaging 06/2022 showed stability  -renin, aldo , 24-hour urinary cortisol and catecholamines normal 05/2022     3. Microalbuminuria :  -Resolved -Continue olmesartan-HCTZ     F/U in 3 months    Signed electronically by: Lyndle Herrlich, MD  [facility] Endocrinology  [facility] [address] [place] [place], [place] [address] Phone: [phone] FAX: [phone]   CC: Myrlene Broker, MD [address] [place] [address] Phone: [phone]  Fax: [phone]  Return to Endocrinology clinic as below: Future Appointments  Date Time Provider Department Center  12/01/2022  3:00 PM Javaughn Opdahl, Konrad Dolores, MD [facility] None  01/16/2023  2:00 PM Myrlene Broker, MD [facility] None  01/28/2023  3:45 PM Candelaria Stagers, DPM [facility]  05/25/2023  3:30 PM McKenzie, Mardene Celeste, MD [facility] None

## 2022-12-01 NOTE — Patient Instructions (Addendum)
-   Continue  Jardiance 25 mg, 1 tablet  every morning  - Increase  Glipizide 5 mg, 1 tablet before Breakfast and 1 tablet before Supper - Switch Trulicity  to [employer] 5 mg once a week        - HOW TO TREAT LOW BLOOD SUGARS (Blood sugar LESS THAN 70 MG/DL) Please follow the RULE OF 15 for the treatment of hypoglycemia treatment (when your (blood sugars are less than 70 mg/dL)   STEP 1: Take 15 grams of carbohydrates when your blood sugar is low, which includes:  3-4 GLUCOSE TABS  OR 3-4 OZ OF JUICE OR REGULAR SODA OR ONE TUBE OF GLUCOSE GEL    STEP 2: RECHECK blood sugar in 15 MINUTES STEP 3: If your blood sugar is still low at the 15 minute recheck --> then, go back to STEP 1 and treat AGAIN with another 15 grams of carbohydrates.   24-Hour Urine Collection  You will be collecting your urine for a 24-hour period of time. Your timer starts with your first urine of the morning (For example - If you first pee at 9AM, your timer will start at 9AM) Throw away your first urine of the morning Collect your urine every time you pee for the next 24 hours STOP your urine collection 24 hours after you started the collection (For example - You would stop at 9AM the day after you started)

## 2022-12-28 ENCOUNTER — Other Ambulatory Visit: Payer: Self-pay | Admitting: Internal Medicine

## 2023-01-05 ENCOUNTER — Emergency Department (HOSPITAL_COMMUNITY): Payer: BC Managed Care – PPO

## 2023-01-05 ENCOUNTER — Other Ambulatory Visit: Payer: Self-pay

## 2023-01-05 ENCOUNTER — Encounter (HOSPITAL_COMMUNITY): Payer: Self-pay

## 2023-01-05 ENCOUNTER — Emergency Department (HOSPITAL_COMMUNITY)
Admission: EM | Admit: 2023-01-05 | Discharge: 2023-01-05 | Disposition: A | Discharge status: Home / Self Care | Payer: BC Managed Care – PPO | Attending: Emergency Medicine | Admitting: Emergency Medicine

## 2023-01-05 DIAGNOSIS — Z7901 Long term (current) use of anticoagulants: Secondary | ICD-10-CM | POA: Diagnosis not present

## 2023-01-05 DIAGNOSIS — I110 Hypertensive heart disease with heart failure: Secondary | ICD-10-CM | POA: Insufficient documentation

## 2023-01-05 DIAGNOSIS — E119 Type 2 diabetes mellitus without complications: Secondary | ICD-10-CM | POA: Insufficient documentation

## 2023-01-05 DIAGNOSIS — M25461 Effusion, right knee: Secondary | ICD-10-CM | POA: Diagnosis not present

## 2023-01-05 DIAGNOSIS — M25561 Pain in right knee: Secondary | ICD-10-CM | POA: Insufficient documentation

## 2023-01-05 DIAGNOSIS — Z79899 Other long term (current) drug therapy: Secondary | ICD-10-CM | POA: Insufficient documentation

## 2023-01-05 DIAGNOSIS — I509 Heart failure, unspecified: Secondary | ICD-10-CM | POA: Diagnosis not present

## 2023-01-05 DIAGNOSIS — I4892 Unspecified atrial flutter: Secondary | ICD-10-CM | POA: Diagnosis present

## 2023-01-05 DIAGNOSIS — E876 Hypokalemia: Secondary | ICD-10-CM | POA: Diagnosis not present

## 2023-01-05 DIAGNOSIS — Z7984 Long term (current) use of oral hypoglycemic drugs: Secondary | ICD-10-CM | POA: Diagnosis not present

## 2023-01-05 LAB — CBC WITH DIFFERENTIAL/PLATELET
Abs Immature Granulocytes: 0.01 10*3/uL (ref 0.00–0.07)
Basophils Absolute: 0 10*3/uL (ref 0.0–0.1)
Basophils Relative: 1 %
Eosinophils Absolute: 0.1 10*3/uL (ref 0.0–0.5)
Eosinophils Relative: 1 %
HCT: 44.2 % (ref 39.0–52.0)
Hemoglobin: 14.1 g/dL (ref 13.0–17.0)
Immature Granulocytes: 0 %
Lymphocytes Relative: 16 %
Lymphs Abs: 1 10*3/uL (ref 0.7–4.0)
MCH: 28.2 pg (ref 26.0–34.0)
MCHC: 31.9 g/dL (ref 30.0–36.0)
MCV: 88.4 fL (ref 80.0–100.0)
Monocytes Absolute: 0.8 10*3/uL (ref 0.1–1.0)
Monocytes Relative: 13 %
Neutro Abs: 4.4 10*3/uL (ref 1.7–7.7)
Neutrophils Relative %: 69 %
Platelets: 231 10*3/uL (ref 150–400)
RBC: 5 MIL/uL (ref 4.22–5.81)
RDW: 16.5 % — ABNORMAL HIGH (ref 11.5–15.5)
WBC: 6.3 10*3/uL (ref 4.0–10.5)
nRBC: 0 % (ref 0.0–0.2)

## 2023-01-05 LAB — BASIC METABOLIC PANEL
Anion gap: 8 (ref 5–15)
BUN: 44 mg/dL — ABNORMAL HIGH (ref 8–23)
CO2: 24 mmol/L (ref 22–32)
Calcium: 8 mg/dL — ABNORMAL LOW (ref 8.9–10.3)
Chloride: 106 mmol/L (ref 98–111)
Creatinine, Ser: 2.3 mg/dL — ABNORMAL HIGH (ref 0.61–1.24)
GFR, Estimated: 31 mL/min — ABNORMAL LOW (ref >60)
Glucose, Bld: 177 mg/dL — ABNORMAL HIGH (ref 70–99)
Potassium: 3.3 mmol/L — ABNORMAL LOW (ref 3.5–5.1)
Sodium: 138 mmol/L (ref 135–145)

## 2023-01-05 MED ORDER — SODIUM CHLORIDE 0.9 % IV SOLN: INTRAVENOUS | Status: DC

## 2023-01-05 MED ORDER — ETOMIDATE 2 MG/ML IV SOLN
INTRAVENOUS | Status: AC
  Filled 2023-01-05: qty 10

## 2023-01-05 MED ORDER — ETOMIDATE 2 MG/ML IV SOLN
10.0000 mg | Freq: Once | INTRAVENOUS | Status: AC
  Administered 2023-01-05: 10 mg via INTRAVENOUS

## 2023-01-05 MED ORDER — ADENOSINE 6 MG/2ML IV SOLN
INTRAVENOUS | Status: AC
  Filled 2023-01-05: qty 4

## 2023-01-05 MED ORDER — ADENOSINE 6 MG/2ML IV SOLN
12.0000 mg | Freq: Once | INTRAVENOUS | Status: AC
  Administered 2023-01-05: 12 mg via INTRAVENOUS

## 2023-01-05 MED ORDER — ADENOSINE 6 MG/2ML IV SOLN
6.0000 mg | Freq: Once | INTRAVENOUS | Status: AC
  Administered 2023-01-05: 6 mg via INTRAVENOUS
  Filled 2023-01-05: qty 2

## 2023-01-05 MED ORDER — ETOMIDATE 2 MG/ML IV SOLN: 10.0000 mg | Freq: Once | INTRAVENOUS | Status: DC

## 2023-01-05 MED ORDER — HYDROCODONE-ACETAMINOPHEN 5-325 MG PO TABS: ORAL_TABLET | ORAL | 0 refills | Status: DC

## 2023-01-05 NOTE — ED Provider Notes (Signed)
[place] EMERGENCY DEPARTMENT AT [facility] Provider Note   CSN: [id number] Arrival date & time: 01/05/23  1835     History  Chief Complaint  Patient presents with   Knee Pain    swelling    MASUO GOVEIA is a 67 y.o. male.   Knee Pain Associated symptoms: no fever        MARCELLO KACZKA is a 67 y.o. male with medical history of DM, HTN, OSA, CHF, paroxysmal a flutter (anticoagulated on Eliquis) who presents to the Emergency Department complaining of  pain and swelling of his right knee.  He was mowing his lawn and doing yard work yesterday and later developed gradually worsening pain and swelling to the knee.  Pain improves at rest but worsens with attempted weight bearing and movement.  Denies fall or known injury  On ER arrival, pt noted to be tachycardic in the 130's.  Denies chest pain and shortness of breath.  States that he had a similar episode sometime ago and was seen in the ER and was "shocked."  He routinely sees cardiology and is taking Eliquis.   Home Medications Prior to Admission medications   Medication Sig Start Date End Date Taking? Authorizing Provider  ammonium lactate (AMLACTIN) 12 % lotion Apply 1 application topically as needed for dry skin. 03/02/20   Candelaria Stagers, DPM  atorvastatin (LIPITOR) 80 MG tablet TAKE 1 TABLET BY MOUTH EVERY DAY 11/17/22   Quintella Reichert, MD  carvedilol (COREG) 25 MG tablet Take 1 tablet (25 mg total) by mouth 2 (two) times daily with a meal. 01/10/22 01/10/23  Myrlene Broker, MD  ELIQUIS 5 MG TABS tablet TAKE 1 TABLET BY MOUTH TWICE A DAY 09/15/22   Quintella Reichert, MD  empagliflozin (JARDIANCE) 25 MG TABS tablet Take 1 tablet (25 mg total) by mouth daily before breakfast. 10/21/22   Shamleffer, Konrad Dolores, MD  finasteride (PROSCAR) 5 MG tablet TAKE 1 TABLET (5 MG TOTAL) BY MOUTH DAILY. 06/24/22   McKenzie, Mardene Celeste, MD  fluticasone (FLONASE) 50 MCG/ACT nasal spray SPRAY 1 SPRAY INTO BOTH NOSTRILS DAILY.  11/07/20   Quintella Reichert, MD  glipiZIDE (GLUCOTROL) 5 MG tablet Take 1 tablet (5 mg total) by mouth 2 (two) times daily before a meal. 12/01/22   Shamleffer, Konrad Dolores, MD  hydrALAZINE (APRESOLINE) 50 MG tablet Take 1.5 tablets (75 mg total) by mouth 3 (three) times daily. 01/23/22   Dyann Kief, PA-C  isosorbide mononitrate (IMDUR) 60 MG 24 hr tablet TAKE 1 TABLET BY MOUTH EVERY DAY 12/01/22   Myrlene Broker, MD  olmesartan-hydrochlorothiazide (BENICAR HCT) 40-12.5 MG tablet TAKE 1 TABLET BY MOUTH EVERY DAY 09/01/22   Quintella Reichert, MD  tirzepatide [facility]) 5 MG/0.5ML Pen Inject 5 mg into the skin once a week. 12/01/22   Shamleffer, Konrad Dolores, MD      Allergies    Patient has no known allergies.    Review of Systems   Review of Systems  Constitutional:  Negative for chills and fever.  Respiratory:  Negative for shortness of breath.   Cardiovascular:  Negative for chest pain.  Gastrointestinal:  Negative for abdominal pain, nausea and vomiting.  Musculoskeletal:  Positive for arthralgias (right knee pain and swelling).  Skin:  Negative for color change and wound.  Neurological:  Negative for dizziness, weakness, numbness and headaches.  Psychiatric/Behavioral:  Negative for confusion.     Physical Exam Updated Vital Signs BP (!) 127/91  Pulse (!) 131   Temp 99 F (37.2 C) (Oral)   Resp 19   Ht 6\' 5"  (1.956 m)   Wt (!) 140.6 kg   SpO2 96%   BMI 36.76 kg/m  Physical Exam Vitals and nursing note reviewed.  Constitutional:      General: He is not in acute distress.    Appearance: Normal appearance. He is not ill-appearing or toxic-appearing.  Cardiovascular:     Rate and Rhythm: Regular rhythm. Tachycardia present.     Pulses: Normal pulses.  Pulmonary:     Effort: Pulmonary effort is normal. No respiratory distress.     Breath sounds: No wheezing.  Chest:     Chest wall: No tenderness.  Abdominal:     Palpations: Abdomen is soft.     Tenderness:  There is no abdominal tenderness.  Musculoskeletal:        General: Swelling and tenderness present.     Cervical back: Normal range of motion.     Right knee: Swelling present. No deformity or erythema. Decreased range of motion. Normal patellar mobility. Normal pulse.     Comments: Tenderness to palpation along the right patella and medial aspect of the knee.  No bony deformities.  Edema of the knee is noted without erythema or excessive warmth.  No Tenderness of the lower leg  Skin:    General: Skin is warm.     Capillary Refill: Capillary refill takes less than 2 seconds.  Neurological:     General: No focal deficit present.     Mental Status: He is alert.     ED Results / Procedures / Treatments   Labs (all labs ordered are listed, but only abnormal results are displayed) Labs Reviewed  CBC WITH DIFFERENTIAL/PLATELET - Abnormal; Notable for the following components:      Result Value   RDW 16.5 (*)    All other components within normal limits  BASIC METABOLIC PANEL - Abnormal; Notable for the following components:   Potassium 3.3 (*)    Glucose, Bld 177 (*)    BUN 44 (*)    Creatinine, Ser 2.30 (*)    Calcium 8.0 (*)    GFR, Estimated 31 (*)    All other components within normal limits    EKG EKG Interpretation Date/Time:  Monday January 05 2023 19:49:04 EDT Ventricular Rate:  131 PR Interval:  138 QRS Duration:  86 QT Interval:  217 QTC Calculation: 321 R Axis:   54  Text Interpretation: Ectopic atrial tachycardia, unifocal Repol abnrm suggests ischemia, diffuse leads Minimal ST elevation, lateral leads Confirmed by Vanetta Mulders [id number]) on 01/05/2023 8:46:47 PM  Radiology DG Knee Complete 4 Views Right  Result Date: 01/05/2023 CLINICAL DATA:  Right knee pain and swelling. EXAM: RIGHT KNEE - COMPLETE 4+ VIEW COMPARISON:  None Available. FINDINGS: Patellofemoral and medial tibiofemoral peripheral spurring. No fracture or erosive change. Moderate knee joint  effusion. No focal bone lesion. Mild soft tissue edema. IMPRESSION: Mild osteoarthritis with moderate knee joint effusion. Electronically Signed   By: Narda Rutherford M.D.   On: 01/05/2023 21:54    Procedures Procedures    Medications Ordered in ED Medications  adenosine (ADENOCARD) 6 MG/2ML injection 6 mg (has no administration in time range)  0.9 %  sodium chloride infusion (has no administration in time range)    ED Course/ Medical Decision Making/ A&P  Medical Decision Making Pt here with right knee pain and found to have HR in 130's.  Hx of atrial flutter anticoagulated with Eliquis.  Denies chest pain or dyspnea.  Patient given adenosine and cardioverted.  See Dr. Darlyn Chamber note for procedure and critical care  Amount and/or Complexity of Data Reviewed Labs: ordered.    Details: Labs without evidence of leukocytosis, hemoglobin unremarkable.  Mild hypokalemia with potassium of 3.3.  Serum creatinine elevated at 2.3.  This appears to be near his baseline as his creatinine was over 2 7 months ago Radiology: ordered.    Details: X-ray of the knee shows osteoarthritis with knee effusion ECG/medicine tests: ordered.    Details: EKG shows atrial tachycardia, unifocal repull abnormality suggest ischemia diffuse leads minimal ST elevation lateral leads Discussion of management or test interpretation with external provider(s): Pt also seen by Dr. Deretha Emory.  Documented hx of atrial flutter.   See Dr. Darlyn Chamber note for critical care and cardioversion procedure  Patient has been observed in the emergency department after his procedure.  He has remained in sinus rhythm with rate below 100.  Patient denies any symptoms at this time. Knee pain likely related to overuse.  Does have effusion.  Concerning symptoms for septic joint.  Ace wrap and knee brace applied.  He will follow-up with orthopedics for further evaluation of his knee.  Short course of pain  medication provided.  He also follow-up with his cardiologist this week.  Risk Prescription drug management.           Final Clinical Impression(s) / ED Diagnoses Final diagnoses:  Atrial flutter, paroxysmal (HCC)  Acute pain of right knee    Rx / DC Orders ED Discharge Orders     None         Pauline Aus, PA-C 01/05/23 2357    Vanetta Mulders, MD 01/06/23 [id number]

## 2023-01-05 NOTE — ED Triage Notes (Signed)
Pt reports:  Right knee pain Swollen

## 2023-01-05 NOTE — ED Notes (Addendum)
Patient cardioverted. 50J delivered following the Etomidate.

## 2023-01-05 NOTE — Progress Notes (Signed)
RT at bedside for cardioversion. 

## 2023-01-05 NOTE — Discharge Instructions (Addendum)
You had an abnormal rhythm of your heart this evening and were shocked to put your heart back into a regular rate and rhythm.  Please follow-up with your cardiologist this week.  Your knee shows arthritis with some swelling.  Wear the Ace wrap and knee brace when walking or standing.  Continue to apply ice packs on and off to help with swelling.  Call the orthopedic provider listed to arrange follow-up appointment regarding your knee pain.

## 2023-01-05 NOTE — ED Provider Notes (Addendum)
Patient unknown to him and is in a rapid heart rate up to around 139 could be consistent with an SVT but is got a history of atrial flutter so this could be atrial flutter.  Patient has been taking his Eliquis he just missed his evening dose today because he has been here.  But is taking it all the other days.  Patient is followed by Dr. Mayford Knife from cardiology.  Had atrial flutter in the past.  Patient is candidate for cardioversion but we will try adenosine first to see if this looks like underlying flutter just in case it is an SVT and it does convert.  Patient will have new IV established labs cardiac monitoring started on 2 L of oxygen we will do 6 mg of adenosine initially.  And then may proceed to cardioversion.  CRITICAL CARE Performed by: Vanetta Mulders Total critical care time: 45 minutes Critical care time was exclusive of separately billable procedures and treating other patients. Critical care was necessary to treat or prevent imminent or life-threatening deterioration. Critical care was time spent personally by me on the following activities: development of treatment plan with patient and/or surrogate as well as nursing, discussions with consultants, evaluation of patient's response to treatment, examination of patient, obtaining history from patient or surrogate, ordering and performing treatments and interventions, ordering and review of laboratory studies, ordering and review of radiographic studies, pulse oximetry and re-evaluation of patient's condition.    Vanetta Mulders, MD 01/05/23 2044  Patient seems 6 mg of adenosine did not get the pause.  So we went to 12 mg adenosine we got a good pause definitely saw flutter waves.  So his underlying rhythm is atrial flutter.  He is back into the heart rate around 130s.  Patient tolerated that very well.  Will go ahead and proceed with etomidate and synchronized cardioversion starting at 50 J.  Patient was sedated with etomidate.   Synchronized cardioversion at 50 J.  Converted back into sinus rhythm.  Patient now awake tolerated procedure well no particular complications.  We will continue to observe him to make sure he stays in sinus.    Vanetta Mulders, MD 01/05/23 2103  .Cardioversion  Date/Time: 01/05/2023 9:15 PM  Performed by: Vanetta Mulders, MD Authorized by: Vanetta Mulders, MD   Consent:    Consent obtained:  Verbal   Consent given by:  Patient   Risks discussed:  Cutaneous burn, death and induced arrhythmia   Alternatives discussed:  Rate-control medication Pre-procedure details:    Cardioversion basis:  Elective   Rhythm:  Atrial flutter   Electrode placement:  Anterior-posterior Patient sedated: Yes. Refer to sedation procedure documentation for details of sedation.  Attempt one:    Cardioversion mode:  Synchronous   Waveform:  Biphasic   Shock (Joules):  50   Shock outcome:  Conversion to normal sinus rhythm Post-procedure details:    Patient status:  Awake   Patient tolerance of procedure:  Tolerated well, no immediate complications   I provided a substantive portion of the care of this patient.  I personally made/approved the management plan for this patient and take responsibility for the patient management.  EKG Interpretation Date/Time:  Monday January 05 2023 19:49:04 EDT Ventricular Rate:  131 PR Interval:  138 QRS Duration:  86 QT Interval:  217 QTC Calculation: 321 R Axis:   54  Text Interpretation: Ectopic atrial tachycardia, unifocal Repol abnrm suggests ischemia, diffuse leads Minimal ST elevation, lateral leads Confirmed by Vanetta Mulders [phone]) on  01/05/2023 8:46:47 PM      Vanetta Mulders, MD 01/05/23 2116

## 2023-01-09 ENCOUNTER — Ambulatory Visit (INDEPENDENT_AMBULATORY_CARE_PROVIDER_SITE_OTHER): Payer: BC Managed Care – PPO | Admitting: Physician Assistant

## 2023-01-09 ENCOUNTER — Encounter: Payer: Self-pay | Admitting: Physician Assistant

## 2023-01-09 DIAGNOSIS — M2550 Pain in unspecified joint: Secondary | ICD-10-CM | POA: Diagnosis not present

## 2023-01-09 DIAGNOSIS — I878 Other specified disorders of veins: Secondary | ICD-10-CM | POA: Diagnosis not present

## 2023-01-09 DIAGNOSIS — M25561 Pain in right knee: Secondary | ICD-10-CM | POA: Insufficient documentation

## 2023-01-09 MED ORDER — LIDOCAINE HCL 1 % IJ SOLN
3.0000 mL | INTRAMUSCULAR | Status: AC | PRN
  Administered 2023-01-09: 3 mL

## 2023-01-09 NOTE — Progress Notes (Addendum)
Office Visit Note   Patient: Johnny [name]           Date of Birth: 13-Nov-1955           MRN: [id number] Visit Date: 01/09/2023              Requested by: Myrlene Broker, MD [address] [place],  [place] [address] PCP: Myrlene Broker, MD   Assessment & Plan: Visit Diagnoses:  1. Polyarthralgia   2. Acute pain of right knee   3. Venous stasis of lower extremity     Plan: Johnny [name] is a 67 year old gentleman with a 4-day history of right knee pain and swelling and right lower extremity swelling.  He says this began on Monday after he was out doing yard work.  He did not have any pain but then when he came in he noticed swelling in his knee.  Denies any other injuries.  He also had quite a bit of swelling in his leg he was seen and evaluated at the emergency room x-rays show some degenerative changes but no acute injuries.  He was told to follow-up with Ortho.  He denies any specific history.  He does have significant swelling in his right lower extremity but no calf pain.  He has some areas of open weeping.  When questioned he said he used to wear compression hose but stopped wearing them when his legs "got better ".  He has a history of diabetes with an hemoglobin A1c of 9.8 he also has diminished renal function.  On exam today he did have biphasic pulses by Doppler both dorsalis pedis and posterior tibial.  Could not appreciate any cellulitis.  I did elect to aspirate the knee and got 50 cc of yellow-colored fluid no purulence.  Will place him in a Profore wrap for a week and really evaluate him early next week.  He develops any fever or chills he is to go to the emergency room.  Also encouraged him to follow-up with his primary care.  Unfortunately and not able to give him any Toradol or steroid intra-articularly because of his kidney function and A1c status  Follow-Up Instructions: No follow-ups on file.   Orders:  No orders of the defined types were placed in this  encounter.  No orders of the defined types were placed in this encounter.     Procedures: Large Joint Inj: R knee on 01/09/2023 3:34 PM Indications: pain and diagnostic evaluation Details: 25 G 1.5 in needle, superolateral approach  Arthrogram: No  Medications: 3 mL lidocaine 1 % Aspirate: yellow Outcome: tolerated well, no immediate complications  After obtaining verbal consent the superior lateral pouch of the right knee was prepped twice with alcohol and twice with Betadine.  4 cc of lidocaine plain was injected intra-articular.  After adequate analgesia was achieved 50 cc of yellow fluid was aspirated from the knee Band-Aid was applied Procedure, treatment alternatives, risks and benefits explained, specific risks discussed. Consent was given by the patient.     Clinical Data: No additional findings.   Subjective: Chief Complaint  Patient presents with  . Right Knee - Pain    HPI patient is a pleasant 67 year old gentleman with a 4-day history of right knee and leg swelling.  This occurred after doing yard work.  He says he has had swelling in his legs before and was wearing compression hose.  He did go to the emergency room with this and he was told to follow-up  with orthopedics.  He did have x-rays that were not consistent with any fractures or traumatic injuries.  Denies any fever chills shortness of breath he is on Eliquis  Review of Systems  All other systems reviewed and are negative.    Objective: Vital Signs: There were no vitals taken for this visit.  Physical Exam Constitutional:      Appearance: Normal appearance.  Pulmonary:     Effort: Pulmonary effort is normal.  Skin:    General: Skin is warm and dry.  Neurological:     General: No focal deficit present.     Mental Status: He is alert.  Psychiatric:        Mood and Affect: Mood normal.        Behavior: Behavior normal.    Ortho Exam Examination of his right lower extremity he has a moderate  effusion in his knee but no erythema or cellulitis.  Lower extremity no calf pain but he has significant swelling no cellulitis.  He does have areas of skin compromise but no evidence of infection.  He has biphasic pulses by Doppler both dorsalis pedis and posterior tibial tendon.  No ascending cellulitis.  Negative Homans' sign Specialty Comments:  No specialty comments available.  Imaging: No results found.   PMFS History: Patient Active Problem List   Diagnosis Date Noted  . Pain in right knee 01/09/2023  . Venous stasis of lower extremity 01/09/2023  . BPH with obstruction/lower urinary tract symptoms 05/31/2020  . Paroxysmal atrial flutter (HCC)   . Adrenal nodule (HCC) 03/28/2020  . OSA (obstructive sleep apnea) 03/28/2020  . Morbid obesity (HCC) 03/28/2020  . Medication noncompliance due to cognitive impairment 03/28/2020  . Chronic systolic CHF (congestive heart failure) (HCC) 03/28/2020  . CKD (chronic kidney disease) stage 3, GFR 30-59 ml/min (HCC) 06/10/2017  . Routine health maintenance 11/06/2011  . 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 Type 2 diabetes with complication (HCC) 08/03/2007  . Hyperlipidemia associated with type 2 diabetes mellitus (HCC) 08/03/2007  . Essential hypertension 08/03/2007   Past Medical History:  Diagnosis Date  . CKD (chronic kidney disease) stage 3, GFR 30-59 ml/min (HCC) 06/10/2017  . Constipation 03/27/2020  . Diabetes mellitus   . Dysrhythmia    A flutter  . Essential hypertension 08/03/2007   Qualifier: Diagnosis of  By: Debby Bud MD, Rosalyn Gess  Med ARB, diuretic    . Guttate psoriasis   . Hyperlipidemia associated with type 2 diabetes mellitus (HCC) 08/03/2007   Qualifier: Diagnosis of  By: Debby Bud MD, Rosalyn Gess  meds - none   . Paroxysmal atrial flutter (HCC)    S/P TEE/DCCV 07/2019  . 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 Sleep apnea   . Type II diabetes mellitus with renal manifestations,  uncontrolled 08/03/2007   Qualifier: Diagnosis of  By: Debby Bud MD, Rosalyn Gess  Med metformin     Family History  Problem Relation Age of Onset  . Hypertension Father        DOB [birth year]  . Hyperlipidemia Father   . Diabetes Father   . Coronary artery disease Father        with stents  . Nephrolithiasis Father   . Kidney disease Father        s/p rejected kidney transplant; on HD  . Memory loss Mother        DOB 59  . Kidney disease Mother  s/p nephrectomy, infection problem  . Dementia Mother   . Cancer Brother        lung  . HIV Brother   . Prostate cancer Neg Hx   . Colon cancer Neg Hx     Past Surgical History:  Procedure Laterality Date  . CARDIOVERSION N/A 07/18/2019   Procedure: CARDIOVERSION;  Surgeon: Lars Masson, MD;  Location: [facility] ENDOSCOPY;  Service: Cardiovascular;  Laterality: N/A;  . TEE WITHOUT CARDIOVERSION N/A 07/18/2019   Procedure: TRANSESOPHAGEAL ECHOCARDIOGRAM (TEE);  Surgeon: Lars Masson, MD;  Location: [facility] ENDOSCOPY;  Service: Cardiovascular;  Laterality: N/A;  . TOOTH EXTRACTION    . XI ROBOTIC ASSISTED SIMPLE PROSTATECTOMY N/A 05/31/2020   Procedure: XI ROBOTIC ASSISTED SIMPLE PROSTATECTOMY-SUPRAPUBIC;  Surgeon: Malen Gauze, MD;  Location: [facility] ORS;  Service: Urology;  Laterality: N/A;   Social History   Occupational History  . Occupation: mfg  Tobacco Use  . Smoking status: Never    Passive exposure: Yes  . Smokeless tobacco: Never  Vaping Use  . Vaping status: Never Used  Substance and Sexual Activity  . Alcohol use: No  . Drug use: No  . Sexual activity: Yes

## 2023-01-12 ENCOUNTER — Encounter: Payer: Self-pay | Admitting: Physician Assistant

## 2023-01-12 ENCOUNTER — Ambulatory Visit (INDEPENDENT_AMBULATORY_CARE_PROVIDER_SITE_OTHER): Payer: BC Managed Care – PPO | Admitting: Physician Assistant

## 2023-01-12 DIAGNOSIS — M25561 Pain in right knee: Secondary | ICD-10-CM

## 2023-01-12 MED ORDER — LIDOCAINE HCL 1 % IJ SOLN
3.0000 mL | INTRAMUSCULAR | Status: AC | PRN
Start: 2023-01-12 — End: 2023-01-12
  Administered 2023-01-12: 3 mL

## 2023-01-12 MED ORDER — HYDROCODONE-ACETAMINOPHEN 5-325 MG PO TABS: 1.0000 | ORAL_TABLET | ORAL | 0 refills | Status: DC | PRN

## 2023-01-12 NOTE — Progress Notes (Signed)
Office Visit Note   Patient: Johnny [name]           Date of Birth: [birth year]           MRN: [id number] Visit Date: 01/12/2023              Requested by: Myrlene Broker, MD [address] [place],  [place] [address] PCP: Myrlene Broker, MD  Chief Complaint  Patient presents with  . Right Knee - Follow-up      HPI: Mr. Bula is a pleasant 67 year old gentleman who comes in today for removal of Profore wrap on his right leg.  Also for reevaluation of his right knee effusion.  He does feel a little bit better but he is still using a walker.  Denies any fever or chills.  He denies any malaise.  His blood sugars have been at his baseline.  He denies any injuries with this knee other than he has a recollection of some bee stings while he was out in the yard but not to his knee.  He had quite a bit of swelling in his lower leg though he was neurovascularly intact last week so I did put him in a Profore dressing.  Assessment & Plan: Visit Diagnoses: Right knee effusion  Plan: Patient had a significant improvement with a Profore wrap.  He does have a recurrence of effusion in his right knee.  There is nothing redness or cellulitis.  I went forward and aspirated some blood-tinged slightly turbid fluid 90 cc today.  Will send this for evaluation.  I did give him a prescription to get some 5 compression socks size 2 XL.  Explained the importance of wearing these.  He understands if he develops any fever chills or malaise until I can call him about the lab work he is to go to the ER or call us immediately.   Follow-Up Instructions: Will discuss after aspiration culture  Ortho Exam  Patient is alert, oriented, no adenopathy, well-dressed, normal affect, normal respiratory effort. Examination of his right knee he has no redness but has a moderate effusion.  Compartments are soft and nontender right lower extremity significant decrease in swelling with wrinkling of the skin.  He  is neurovascularly intact no cellulitis  Imaging: No results found. No images are attached to the encounter.  Labs: Lab Results  Component Value Date   HGBA1C 9.8 (A) 12/01/2022   HGBA1C 7.8 (A) 05/27/2022   HGBA1C 7.5 (A) 12/27/2021   REPTSTATUS 03/30/2020 FINAL 03/28/2020   CULT  03/28/2020    NO GROWTH Performed at [facility], [address], [place], [place] [address]      Lab Results  Component Value Date   ALBUMIN 3.6 03/28/2020   ALBUMIN 4.1 03/27/2020   ALBUMIN 4.5 09/05/2019    Lab Results  Component Value Date   MG 2.0 03/28/2020   MG 2.0 07/17/2019   MG 2.0 07/14/2019   No results found for: "VD25OH"  No results found for: "PREALBUMIN"    Latest Ref Rng & Units 01/05/2023    8:53 PM 05/27/2022    2:47 PM 06/03/2020    6:59 AM  CBC EXTENDED  WBC 4.0 - 10.5 K/uL 6.3  4.5    RBC 4.22 - 5.81 MIL/uL 5.00  4.74    Hemoglobin 13.0 - 17.0 g/dL 95.6  38.7  56.4   HCT 39.0 - 52.0 % 44.2  42.6  35.3   Platelets 150 - 400  K/uL 231  248.0    NEUT# 1.7 - 7.7 K/uL 4.4     Lymph# 0.7 - 4.0 K/uL 1.0        There is no height or weight on file to calculate BMI.  Orders:  No orders of the defined types were placed in this encounter.  No orders of the defined types were placed in this encounter.    Procedures: Large Joint Inj: R knee on 01/12/2023 2:34 PM Indications: pain and diagnostic evaluation Details: 25 G 1.5 in needle, superolateral approach  Arthrogram: No  Medications: 3 mL lidocaine 1 % Aspirate: 90 mL blood-tinged; sent for lab analysis Outcome: tolerated well, no immediate complications  After obtaining verbal consent the superior lateral pouch of the right knee was prepped with alcohol and Betadine.  3 cc of lidocaine plain were injected.  After adequate analgesia 90 cc of blood-tinged slightly cloudy fluid was aspirated.  Band-Aid and Ace wrap applied.  Patient self ambulated from the clinic Procedure, treatment alternatives, risks and  benefits explained, specific risks discussed. Consent was given by the patient.    Clinical Data: No additional findings.  ROS:  All other systems negative, except as noted in the HPI. Review of Systems  Objective: Vital Signs: There were no vitals taken for this visit.  Specialty Comments:  No specialty comments available.  PMFS History: Patient Active Problem List   Diagnosis Date Noted  . Pain in right knee 01/09/2023  . Venous stasis of lower extremity 01/09/2023  . BPH with obstruction/lower urinary tract symptoms 05/31/2020  . Paroxysmal atrial flutter (HCC)   . Adrenal nodule (HCC) 03/28/2020  . OSA (obstructive sleep apnea) 03/28/2020  . Morbid obesity (HCC) 03/28/2020  . Medication noncompliance due to cognitive impairment 03/28/2020  . Chronic systolic CHF (congestive heart failure) (HCC) 03/28/2020  . CKD (chronic kidney disease) stage 3, GFR 30-59 ml/min (HCC) 06/10/2017  . Routine health maintenance 11/06/2011  . 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 Type 2 diabetes with complication (HCC) 08/03/2007  . Hyperlipidemia associated with type 2 diabetes mellitus (HCC) 08/03/2007  . Essential hypertension 08/03/2007   Past Medical History:  Diagnosis Date  . CKD (chronic kidney disease) stage 3, GFR 30-59 ml/min (HCC) 06/10/2017  . Constipation 03/27/2020  . Diabetes mellitus   . Dysrhythmia    A flutter  . Essential hypertension 08/03/2007   Qualifier: Diagnosis of  By: Debby Bud MD, Rosalyn Gess  Med ARB, diuretic    . Guttate psoriasis   . Hyperlipidemia associated with type 2 diabetes mellitus (HCC) 08/03/2007   Qualifier: Diagnosis of  By: Debby Bud MD, Rosalyn Gess  meds - none   . Paroxysmal atrial flutter (HCC)    S/P TEE/DCCV 07/2019  . 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 Sleep apnea   . Type II diabetes mellitus with renal manifestations, uncontrolled 08/03/2007   Qualifier: Diagnosis of  By: Debby Bud MD, Rosalyn Gess   Med metformin     Family History  Problem Relation Age of Onset  . Hypertension Father        DOB [birth year]  . Hyperlipidemia Father   . Diabetes Father   . Coronary artery disease Father        with stents  . Nephrolithiasis Father   . Kidney disease Father        s/p rejected kidney transplant; on HD  . Memory loss Mother        DOB 67  .  Kidney disease Mother        s/p nephrectomy, infection problem  . Dementia Mother   . Cancer Brother        lung  . HIV Brother   . Prostate cancer Neg Hx   . Colon cancer Neg Hx     Past Surgical History:  Procedure Laterality Date  . CARDIOVERSION N/A 07/18/2019   Procedure: CARDIOVERSION;  Surgeon: Lars Masson, MD;  Location: [facility] ENDOSCOPY;  Service: Cardiovascular;  Laterality: N/A;  . TEE WITHOUT CARDIOVERSION N/A 07/18/2019   Procedure: TRANSESOPHAGEAL ECHOCARDIOGRAM (TEE);  Surgeon: Lars Masson, MD;  Location: [facility] ENDOSCOPY;  Service: Cardiovascular;  Laterality: N/A;  . TOOTH EXTRACTION    . XI ROBOTIC ASSISTED SIMPLE PROSTATECTOMY N/A 05/31/2020   Procedure: XI ROBOTIC ASSISTED SIMPLE PROSTATECTOMY-SUPRAPUBIC;  Surgeon: Malen Gauze, MD;  Location: [facility] ORS;  Service: Urology;  Laterality: N/A;   Social History   Occupational History  . Occupation: mfg  Tobacco Use  . Smoking status: Never    Passive exposure: Yes  . Smokeless tobacco: Never  Vaping Use  . Vaping status: Never Used  Substance and Sexual Activity  . Alcohol use: No  . Drug use: No  . Sexual activity: Yes

## 2023-01-12 NOTE — Addendum Note (Signed)
Addended by: Elizebeth Brooking on: 01/12/2023 03:41 PM   Modules accepted: Orders

## 2023-01-13 ENCOUNTER — Ambulatory Visit (HOSPITAL_COMMUNITY)
Admission: RE | Admit: 2023-01-13 | Discharge: 2023-01-13 | Disposition: A | Discharge status: Home / Self Care | Payer: BC Managed Care – PPO | Source: Ambulatory Visit | Attending: Physician Assistant | Admitting: Physician Assistant

## 2023-01-13 VITALS — BP 128/78 | HR 96 | Ht 77.0 in | Wt 299.6 lb

## 2023-01-13 DIAGNOSIS — G4733 Obstructive sleep apnea (adult) (pediatric): Secondary | ICD-10-CM | POA: Insufficient documentation

## 2023-01-13 DIAGNOSIS — I13 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 1 through stage 4 chronic kidney disease, or unspecified chronic kidney disease: Secondary | ICD-10-CM | POA: Diagnosis not present

## 2023-01-13 DIAGNOSIS — N189 Chronic kidney disease, unspecified: Secondary | ICD-10-CM | POA: Diagnosis not present

## 2023-01-13 DIAGNOSIS — E1122 Type 2 diabetes mellitus with diabetic chronic kidney disease: Secondary | ICD-10-CM | POA: Insufficient documentation

## 2023-01-13 DIAGNOSIS — Z79899 Other long term (current) drug therapy: Secondary | ICD-10-CM | POA: Diagnosis not present

## 2023-01-13 DIAGNOSIS — I5022 Chronic systolic (congestive) heart failure: Secondary | ICD-10-CM | POA: Diagnosis not present

## 2023-01-13 DIAGNOSIS — I4891 Unspecified atrial fibrillation: Secondary | ICD-10-CM | POA: Insufficient documentation

## 2023-01-13 DIAGNOSIS — Z7901 Long term (current) use of anticoagulants: Secondary | ICD-10-CM | POA: Diagnosis not present

## 2023-01-13 DIAGNOSIS — D6869 Other thrombophilia: Secondary | ICD-10-CM | POA: Diagnosis not present

## 2023-01-13 DIAGNOSIS — I4892 Unspecified atrial flutter: Secondary | ICD-10-CM | POA: Diagnosis not present

## 2023-01-13 DIAGNOSIS — I483 Typical atrial flutter: Secondary | ICD-10-CM

## 2023-01-13 NOTE — Progress Notes (Signed)
Primary Care Physician: Myrlene Broker, MD Primary Cardiologist: Armanda Magic, MD Electrophysiologist: None  Referring Physician: [facility] ED/Dr Clemetine Marker Mayhall is a 67 y.o. male with a history of CKD, DM, HTN, systolic CHF, psoriasis, aortic atherosclerosis, atrial flutter who presents for follow up in the [facility].  The patient was initially diagnosed with atrial flutter in 2021. He was admitted and underwent TEE guided DCCV at that time. Echo showed EF 35-40%. Repeat echo post DCCV showed recovery of EF to 60-65%. Presented again to the ED 03/2020 and underwent DCCV in the ED. Patient is on Eliquis for a CHADS2VASC score of 3.  On follow up today, patient was seen at the ED 01/05/23 for knee pain and swelling and found to be in rapid atrial flutter. Unclear at first what type of SVT he was in, given adenosine which showed clear flutter waves. He underwent DCCV at that time. Today, he remains in SR. No bleeding issues on anticoagulation.   Today, he denies symptoms of palpitations, chest pain, shortness of breath, orthopnea, PND, lower extremity edema, dizziness, presyncope, syncope, snoring, daytime somnolence, bleeding, or neurologic sequela. The patient is tolerating medications without difficulties and is otherwise without complaint today.    Atrial Fibrillation Risk Factors:  he does have symptoms or diagnosis of sleep apnea. he is compliant with BiPAP therapy. he does not have a history of rheumatic fever.   Atrial Fibrillation Management history:  Previous antiarrhythmic drugs: none Previous cardioversions: 07/18/19, 03/2020, 01/05/23 Previous ablations: none Anticoagulation history: Eliquis  ROS- All systems are reviewed and negative except as per the HPI above.  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 180 tablet 3   ELIQUIS 5 MG TABS tablet TAKE 1 TABLET BY MOUTH TWICE A DAY 180 tablet 1   empagliflozin (JARDIANCE) 25 MG TABS tablet Take 1 tablet (25 mg total) by mouth daily before breakfast. 90 tablet 3   finasteride (PROSCAR) 5 MG tablet TAKE 1 TABLET (5 MG TOTAL) BY MOUTH DAILY. 90 tablet 3   glipiZIDE (GLUCOTROL) 5 MG tablet Take 1 tablet (5 mg total) by mouth 2 (two) times daily before a meal. 180 tablet 2   hydrALAZINE (APRESOLINE) 50 MG tablet Take 1.5 tablets (75 mg total) by mouth 3 (three) times daily. (Patient taking differently: Take 50 mg by mouth 3 (three) times daily. Taking 50 mg by mouth TID) 405 tablet 3   HYDROcodone-acetaminophen (NORCO/VICODIN)  5-325 MG tablet Take 1 tablet by mouth every 4 (four) hours as needed for moderate pain. 20 tablet 0   isosorbide mononitrate (IMDUR) 60 MG 24 hr tablet TAKE 1 TABLET BY MOUTH EVERY DAY 90 tablet 0   olmesartan-hydrochlorothiazide (BENICAR HCT) 40-12.5 MG tablet TAKE 1 TABLET BY MOUTH EVERY DAY 90 tablet 3   Oral Electrolytes (EMERGEN-C ELECTRO MIX) PACK Take 2 packets by mouth as needed.     tirzepatide [facility]) 5 MG/0.5ML Pen Inject 5 mg into the skin once a week. 6 mL  3   Turmeric (QC TUMERIC COMPLEX) 500 MG CAPS Take 1,000 mg by mouth daily.     No current facility-administered medications for this encounter.    Physical Exam: BP 128/78   Pulse 96   Ht 6\' 5"  (1.956 m)   Wt 135.9 kg   BMI 35.53 kg/m   GEN: Well nourished, well developed in no acute distress NECK: No JVD; No carotid bruits CARDIAC: Regular rate and rhythm, no murmurs, rubs, gallops RESPIRATORY:  Clear to auscultation without rales, wheezing or rhonchi  ABDOMEN: Soft, non-tender, non-distended EXTREMITIES:  No edema; No deformity, R knee wrapped   Wt Readings from Last 3 Encounters:  01/13/23 135.9 kg  01/05/23 (!) 140.6 kg  12/01/22 (!) 143.3 kg     EKG today demonstrates  SR Vent. rate 96 BPM PR interval 128 ms QRS duration 76 ms QT/QTcB 364/459 ms  Echo 08/19/19 demonstrated   1. Left ventricular ejection fraction, by estimation, is 60 to 65%. The  left ventricle has normal function. The left ventricle has no regional  wall motion abnormalities. There is mild concentric left ventricular  hypertrophy. Left ventricular diastolic parameters are indeterminate.   2. Right ventricular systolic function is normal. The right ventricular  size is mildly enlarged. There is normal pulmonary artery systolic  pressure.   3. Left atrial size was moderately dilated.   4. Right atrial size was moderately dilated.   5. The mitral valve is normal in structure. No evidence of mitral valve  regurgitation. No evidence of mitral stenosis.   6. The aortic valve is normal in structure. Aortic valve regurgitation is  not visualized. No aortic stenosis is present.   7. Aortic dilatation noted. There is mild dilatation at the level of the  sinuses of Valsalva measuring 40 mm.   8. The inferior vena cava is normal in size with greater than 50%  respiratory variability, suggesting right atrial pressure of 3 mmHg.    CHA2DS2-VASc Score = 4  The patient's score is based upon: CHF History:  1 (previous TCM) HTN History: 1 Diabetes History: 1 Stroke History: 0 Vascular Disease History: 1 (aortic atherosclerosis) Age Score: 0 Gender Score: 0       ASSESSMENT AND PLAN: Typical Atrial flutter The patient's CHA2DS2-VASc score is 4, indicating a 4.8% annual risk of stroke.   S/p DCCV 01/05/23 Review of his ECGs in Epic show typical atrial flutter, no afib. We discussed rhythm control options today. He is agreeable to consultation with EP to discuss ablation, will refer.  Continue carvedilol 25 mg BID Continue Eliquis 5 mg BID  Secondary Hypercoagulable State (ICD10:  D68.69) The patient is at significant risk for stroke/thromboembolism based upon his CHA2DS2-VASc Score of 4.  Continue Apixaban (Eliquis).   HTN Stable on current regimen  HFrEF EF 35-40% in setting of atrial flutter Recovered to 60-65% with SR Fluid status appears stable.  OSA  Encouraged nightly BiPap The importance of adequate  treatment of sleep apnea was discussed today in order to improve our ability to maintain sinus rhythm long term. Followed by Dr Mayford Knife    Follow up with EP to discuss ablation.        Jorja Loa PA-C [facility] [address] [place], [place] [address] [phone]

## 2023-01-16 ENCOUNTER — Encounter: Payer: BC Managed Care – PPO | Admitting: Internal Medicine

## 2023-01-18 LAB — ANAEROBIC AND AEROBIC CULTURE
AER RESULT:: NO GROWTH
MICRO NUMBER:: 15439671
MICRO NUMBER:: 15439672
SPECIMEN QUALITY:: ADEQUATE
SPECIMEN QUALITY:: ADEQUATE

## 2023-01-18 LAB — SYNOVIAL FLUID ANALYSIS, COMPLETE
Basophils, %: 0 %
Eosinophils-Synovial: 0 % (ref 0–2)
Lymphocytes-Synovial Fld: 7 % (ref 0–74)
Monocyte/Macrophage: 2 % (ref 0–69)
Neutrophil, Synovial: 91 % — ABNORMAL HIGH (ref 0–24)
Synoviocytes, %: 0 % (ref 0–15)
WBC, Synovial: 30040 {cells}/uL — ABNORMAL HIGH (ref <150)

## 2023-01-19 ENCOUNTER — Encounter: Payer: BC Managed Care – PPO | Admitting: Internal Medicine

## 2023-01-19 ENCOUNTER — Inpatient Hospital Stay (HOSPITAL_COMMUNITY)
Admission: EM | Admit: 2023-01-19 | Discharge: 2023-01-22 | DRG: 565 | Disposition: A | Discharge status: Home / Self Care | Payer: BC Managed Care – PPO | Attending: Internal Medicine | Admitting: Internal Medicine

## 2023-01-19 ENCOUNTER — Telehealth: Payer: Self-pay | Admitting: Internal Medicine

## 2023-01-19 ENCOUNTER — Telehealth: Payer: Self-pay | Admitting: Physician Assistant

## 2023-01-19 DIAGNOSIS — L409 Psoriasis, unspecified: Secondary | ICD-10-CM | POA: Diagnosis present

## 2023-01-19 DIAGNOSIS — R739 Hyperglycemia, unspecified: Secondary | ICD-10-CM

## 2023-01-19 DIAGNOSIS — E1165 Type 2 diabetes mellitus with hyperglycemia: Secondary | ICD-10-CM | POA: Diagnosis present

## 2023-01-19 DIAGNOSIS — I4891 Unspecified atrial fibrillation: Secondary | ICD-10-CM | POA: Diagnosis present

## 2023-01-19 DIAGNOSIS — G4733 Obstructive sleep apnea (adult) (pediatric): Secondary | ICD-10-CM | POA: Diagnosis present

## 2023-01-19 DIAGNOSIS — I483 Typical atrial flutter: Secondary | ICD-10-CM | POA: Diagnosis present

## 2023-01-19 DIAGNOSIS — E1122 Type 2 diabetes mellitus with diabetic chronic kidney disease: Secondary | ICD-10-CM | POA: Diagnosis present

## 2023-01-19 DIAGNOSIS — M25461 Effusion, right knee: Secondary | ICD-10-CM | POA: Diagnosis not present

## 2023-01-19 DIAGNOSIS — Z841 Family history of disorders of kidney and ureter: Secondary | ICD-10-CM

## 2023-01-19 DIAGNOSIS — Z801 Family history of malignant neoplasm of trachea, bronchus and lung: Secondary | ICD-10-CM

## 2023-01-19 DIAGNOSIS — D72829 Elevated white blood cell count, unspecified: Secondary | ICD-10-CM | POA: Diagnosis present

## 2023-01-19 DIAGNOSIS — Z6835 Body mass index (BMI) 35.0-35.9, adult: Secondary | ICD-10-CM

## 2023-01-19 DIAGNOSIS — Z79899 Other long term (current) drug therapy: Secondary | ICD-10-CM

## 2023-01-19 DIAGNOSIS — E1169 Type 2 diabetes mellitus with other specified complication: Secondary | ICD-10-CM | POA: Diagnosis present

## 2023-01-19 DIAGNOSIS — M1711 Unilateral primary osteoarthritis, right knee: Secondary | ICD-10-CM | POA: Diagnosis present

## 2023-01-19 DIAGNOSIS — A419 Sepsis, unspecified organism: Secondary | ICD-10-CM

## 2023-01-19 DIAGNOSIS — I10 Essential (primary) hypertension: Secondary | ICD-10-CM | POA: Diagnosis present

## 2023-01-19 DIAGNOSIS — D75839 Thrombocytosis, unspecified: Secondary | ICD-10-CM | POA: Diagnosis present

## 2023-01-19 DIAGNOSIS — E669 Obesity, unspecified: Secondary | ICD-10-CM | POA: Diagnosis present

## 2023-01-19 DIAGNOSIS — I5022 Chronic systolic (congestive) heart failure: Secondary | ICD-10-CM | POA: Diagnosis present

## 2023-01-19 DIAGNOSIS — Z9079 Acquired absence of other genital organ(s): Secondary | ICD-10-CM

## 2023-01-19 DIAGNOSIS — Z833 Family history of diabetes mellitus: Secondary | ICD-10-CM

## 2023-01-19 DIAGNOSIS — Z83438 Family history of other disorder of lipoprotein metabolism and other lipidemia: Secondary | ICD-10-CM

## 2023-01-19 DIAGNOSIS — D631 Anemia in chronic kidney disease: Secondary | ICD-10-CM | POA: Diagnosis present

## 2023-01-19 DIAGNOSIS — M25561 Pain in right knee: Secondary | ICD-10-CM | POA: Diagnosis present

## 2023-01-19 DIAGNOSIS — E118 Type 2 diabetes mellitus with unspecified complications: Secondary | ICD-10-CM | POA: Diagnosis present

## 2023-01-19 DIAGNOSIS — N179 Acute kidney failure, unspecified: Secondary | ICD-10-CM | POA: Diagnosis present

## 2023-01-19 DIAGNOSIS — Z7984 Long term (current) use of oral hypoglycemic drugs: Secondary | ICD-10-CM

## 2023-01-19 DIAGNOSIS — Z8249 Family history of ischemic heart disease and other diseases of the circulatory system: Secondary | ICD-10-CM

## 2023-01-19 DIAGNOSIS — Z820 Family history of epilepsy and other diseases of the nervous system: Secondary | ICD-10-CM

## 2023-01-19 DIAGNOSIS — R531 Weakness: Secondary | ICD-10-CM

## 2023-01-19 DIAGNOSIS — N401 Enlarged prostate with lower urinary tract symptoms: Secondary | ICD-10-CM | POA: Diagnosis present

## 2023-01-19 DIAGNOSIS — Z7985 Long-term (current) use of injectable non-insulin antidiabetic drugs: Secondary | ICD-10-CM

## 2023-01-19 DIAGNOSIS — N1832 Chronic kidney disease, stage 3b: Secondary | ICD-10-CM | POA: Diagnosis present

## 2023-01-19 DIAGNOSIS — Z7901 Long term (current) use of anticoagulants: Secondary | ICD-10-CM

## 2023-01-19 DIAGNOSIS — Z9889 Other specified postprocedural states: Secondary | ICD-10-CM

## 2023-01-19 DIAGNOSIS — I13 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 1 through stage 4 chronic kidney disease, or unspecified chronic kidney disease: Secondary | ICD-10-CM | POA: Diagnosis present

## 2023-01-19 DIAGNOSIS — N138 Other obstructive and reflux uropathy: Secondary | ICD-10-CM | POA: Diagnosis present

## 2023-01-19 DIAGNOSIS — E8721 Acute metabolic acidosis: Secondary | ICD-10-CM | POA: Diagnosis present

## 2023-01-19 DIAGNOSIS — I4892 Unspecified atrial flutter: Secondary | ICD-10-CM | POA: Diagnosis present

## 2023-01-19 DIAGNOSIS — E785 Hyperlipidemia, unspecified: Secondary | ICD-10-CM | POA: Diagnosis present

## 2023-01-19 LAB — COMPREHENSIVE METABOLIC PANEL
ALT: 53 U/L — ABNORMAL HIGH (ref 0–44)
AST: 39 U/L (ref 15–41)
Albumin: 2.2 g/dL — ABNORMAL LOW (ref 3.5–5.0)
Alkaline Phosphatase: 76 U/L (ref 38–126)
Anion gap: 20 — ABNORMAL HIGH (ref 5–15)
BUN: 59 mg/dL — ABNORMAL HIGH (ref 8–23)
CO2: 14 mmol/L — ABNORMAL LOW (ref 22–32)
Calcium: 8.9 mg/dL (ref 8.9–10.3)
Chloride: 99 mmol/L (ref 98–111)
Creatinine, Ser: 2.8 mg/dL — ABNORMAL HIGH (ref 0.61–1.24)
GFR, Estimated: 24 mL/min — ABNORMAL LOW (ref >60)
Glucose, Bld: 250 mg/dL — ABNORMAL HIGH (ref 70–99)
Potassium: 4.6 mmol/L (ref 3.5–5.1)
Sodium: 133 mmol/L — ABNORMAL LOW (ref 135–145)
Total Bilirubin: 1.2 mg/dL (ref 0.3–1.2)
Total Protein: 8.2 g/dL — ABNORMAL HIGH (ref 6.5–8.1)

## 2023-01-19 LAB — URINALYSIS, ROUTINE W REFLEX MICROSCOPIC
Bilirubin Urine: NEGATIVE
Glucose, UA: >=500 mg/dL — AB
Ketones, ur: 5 mg/dL — AB
Leukocytes,Ua: NEGATIVE
Nitrite: NEGATIVE
Protein, ur: 30 mg/dL — AB
Specific Gravity, Urine: 1.023 (ref 1.005–1.030)
pH: 5 (ref 5.0–8.0)

## 2023-01-19 LAB — I-STAT CHEM 8, ED
BUN: 52 mg/dL — ABNORMAL HIGH (ref 8–23)
Calcium, Ion: 1.03 mmol/L — ABNORMAL LOW (ref 1.15–1.40)
Chloride: 105 mmol/L (ref 98–111)
Creatinine, Ser: 2.8 mg/dL — ABNORMAL HIGH (ref 0.61–1.24)
Glucose, Bld: 272 mg/dL — ABNORMAL HIGH (ref 70–99)
HCT: 45 % (ref 39.0–52.0)
Hemoglobin: 15.3 g/dL (ref 13.0–17.0)
Potassium: 4.4 mmol/L (ref 3.5–5.1)
Sodium: 134 mmol/L — ABNORMAL LOW (ref 135–145)
TCO2: 15 mmol/L — ABNORMAL LOW (ref 22–32)

## 2023-01-19 LAB — CBC
HCT: 41.8 % (ref 39.0–52.0)
Hemoglobin: 13.4 g/dL (ref 13.0–17.0)
MCH: 27.1 pg (ref 26.0–34.0)
MCHC: 32.1 g/dL (ref 30.0–36.0)
MCV: 84.4 fL (ref 80.0–100.0)
Platelets: 574 10*3/uL — ABNORMAL HIGH (ref 150–400)
RBC: 4.95 MIL/uL (ref 4.22–5.81)
RDW: 14.7 % (ref 11.5–15.5)
WBC: 13 10*3/uL — ABNORMAL HIGH (ref 4.0–10.5)
nRBC: 0 % (ref 0.0–0.2)

## 2023-01-19 LAB — CBG MONITORING, ED
Glucose-Capillary: 240 mg/dL — ABNORMAL HIGH (ref 70–99)
Glucose-Capillary: 246 mg/dL — ABNORMAL HIGH (ref 70–99)

## 2023-01-19 NOTE — Telephone Encounter (Signed)
Patient called needing to know the results of his labs. Patient said fluid was drawn from his right knee. The number to contact patient is [phone]

## 2023-01-19 NOTE — Telephone Encounter (Signed)
No problem.

## 2023-01-19 NOTE — Telephone Encounter (Signed)
Patient is at the ED right now and would like a call he does not know what to do about the pain , I did read his results to him for his testing please advise

## 2023-01-19 NOTE — Telephone Encounter (Signed)
Wife just called and said he cant make it to his appt due to him being so weak and she cant get him up. She said she's calling the ambulance to pick him up so he had to cancel his 3:00 Appt.

## 2023-01-19 NOTE — ED Notes (Signed)
Called pt's name for triage with no answer x1

## 2023-01-19 NOTE — ED Triage Notes (Signed)
Pt to ED POV from home. Pt c/o right knee x3 weeks and was supposed to have cortisone injection last week at orthopedic out pt but was told he could not have injection d/t elevated blood sugar. Pt has hx of DM.

## 2023-01-20 ENCOUNTER — Encounter (HOSPITAL_COMMUNITY): Payer: Self-pay | Admitting: Family Medicine

## 2023-01-20 DIAGNOSIS — N179 Acute kidney failure, unspecified: Secondary | ICD-10-CM

## 2023-01-20 DIAGNOSIS — M25461 Effusion, right knee: Secondary | ICD-10-CM

## 2023-01-20 DIAGNOSIS — E785 Hyperlipidemia, unspecified: Secondary | ICD-10-CM | POA: Insufficient documentation

## 2023-01-20 DIAGNOSIS — A419 Sepsis, unspecified organism: Secondary | ICD-10-CM

## 2023-01-20 DIAGNOSIS — N1832 Chronic kidney disease, stage 3b: Secondary | ICD-10-CM

## 2023-01-20 DIAGNOSIS — D72829 Elevated white blood cell count, unspecified: Secondary | ICD-10-CM | POA: Insufficient documentation

## 2023-01-20 LAB — I-STAT CHEM 8, ED
BUN: 89 mg/dL — ABNORMAL HIGH (ref 8–23)
Calcium, Ion: 1.05 mmol/L — ABNORMAL LOW (ref 1.15–1.40)
Chloride: 104 mmol/L (ref 98–111)
Creatinine, Ser: 3.3 mg/dL — ABNORMAL HIGH (ref 0.61–1.24)
Glucose, Bld: 229 mg/dL — ABNORMAL HIGH (ref 70–99)
HCT: 40 % (ref 39.0–52.0)
Hemoglobin: 13.6 g/dL (ref 13.0–17.0)
Potassium: 4.7 mmol/L (ref 3.5–5.1)
Sodium: 135 mmol/L (ref 135–145)
TCO2: 20 mmol/L — ABNORMAL LOW (ref 22–32)

## 2023-01-20 LAB — BASIC METABOLIC PANEL
Anion gap: 17 — ABNORMAL HIGH (ref 5–15)
BUN: 76 mg/dL — ABNORMAL HIGH (ref 8–23)
CO2: 19 mmol/L — ABNORMAL LOW (ref 22–32)
Calcium: 8.7 mg/dL — ABNORMAL LOW (ref 8.9–10.3)
Chloride: 99 mmol/L (ref 98–111)
Creatinine, Ser: 2.99 mg/dL — ABNORMAL HIGH (ref 0.61–1.24)
GFR, Estimated: 23 mL/min — ABNORMAL LOW (ref >60)
Glucose, Bld: 196 mg/dL — ABNORMAL HIGH (ref 70–99)
Potassium: 4.2 mmol/L (ref 3.5–5.1)
Sodium: 135 mmol/L (ref 135–145)

## 2023-01-20 LAB — SYNOVIAL CELL COUNT + DIFF, W/ CRYSTALS
Crystals, Fluid: NONE SEEN
Eosinophils-Synovial: 0 % (ref 0–1)
Lymphocytes-Synovial Fld: 2 % (ref 0–20)
Monocyte-Macrophage-Synovial Fluid: 3 % — ABNORMAL LOW (ref 50–90)
Neutrophil, Synovial: 95 % — ABNORMAL HIGH (ref 0–25)
WBC, Synovial: 14875 /mm3 — ABNORMAL HIGH (ref 0–200)

## 2023-01-20 LAB — LACTIC ACID, PLASMA: Lactic Acid, Venous: 1.3 mmol/L (ref 0.5–1.9)

## 2023-01-20 LAB — CBG MONITORING, ED
Glucose-Capillary: 240 mg/dL — ABNORMAL HIGH (ref 70–99)
Glucose-Capillary: 201 mg/dL — ABNORMAL HIGH (ref 70–99)

## 2023-01-20 LAB — HIV ANTIBODY (ROUTINE TESTING W REFLEX): HIV Screen 4th Generation wRfx: NONREACTIVE

## 2023-01-20 LAB — GLUCOSE, CAPILLARY
Glucose-Capillary: 232 mg/dL — ABNORMAL HIGH (ref 70–99)
Glucose-Capillary: 90 mg/dL (ref 70–99)

## 2023-01-20 LAB — C-REACTIVE PROTEIN: CRP: 40.2 mg/dL — ABNORMAL HIGH (ref <1.0)

## 2023-01-20 LAB — CREATININE, URINE, RANDOM: Creatinine, Urine: 139 mg/dL

## 2023-01-20 LAB — SODIUM, URINE, RANDOM: Sodium, Ur: <10 mmol/L

## 2023-01-20 LAB — SEDIMENTATION RATE: Sed Rate: 120 mm/h — ABNORMAL HIGH (ref 0–16)

## 2023-01-20 LAB — URIC ACID: Uric Acid, Serum: 9.8 mg/dL — ABNORMAL HIGH (ref 3.7–8.6)

## 2023-01-20 MED ORDER — DICLOFENAC SODIUM 1 % EX GEL
4.0000 g | CUTANEOUS | Status: AC
  Filled 2023-01-20: qty 100

## 2023-01-20 MED ORDER — LIDOCAINE 5 % EX PTCH: 1.0000 | MEDICATED_PATCH | CUTANEOUS | 0 refills | Status: DC

## 2023-01-20 MED ORDER — FENTANYL CITRATE PF 50 MCG/ML IJ SOSY
25.0000 ug | PREFILLED_SYRINGE | Freq: Once | INTRAMUSCULAR | Status: AC
  Administered 2023-01-20: 25 ug via INTRAVENOUS
  Filled 2023-01-20: qty 1

## 2023-01-20 MED ORDER — SODIUM CHLORIDE 0.9 % IV SOLN: INTRAVENOUS | Status: DC

## 2023-01-20 MED ORDER — INSULIN GLARGINE-YFGN 100 UNIT/ML ~~LOC~~ SOLN
5.0000 [IU] | Freq: Every day | SUBCUTANEOUS | Status: DC
  Administered 2023-01-20 – 2023-01-21 (×2): 5 [IU] via SUBCUTANEOUS
  Filled 2023-01-20 (×2): qty 0.05

## 2023-01-20 MED ORDER — ONDANSETRON HCL 4 MG PO TABS: 4.0000 mg | ORAL_TABLET | Freq: Four times a day (QID) | ORAL | Status: DC | PRN

## 2023-01-20 MED ORDER — ONDANSETRON HCL 4 MG/2ML IJ SOLN: 4.0000 mg | Freq: Four times a day (QID) | INTRAMUSCULAR | Status: DC | PRN

## 2023-01-20 MED ORDER — SODIUM CHLORIDE 0.9 % IV SOLN
2.0000 g | Freq: Two times a day (BID) | INTRAVENOUS | Status: DC
  Administered 2023-01-20 – 2023-01-21 (×2): 2 g via INTRAVENOUS
  Filled 2023-01-20 (×2): qty 12.5

## 2023-01-20 MED ORDER — HYDROCODONE-ACETAMINOPHEN 5-325 MG PO TABS: 1.0000 | ORAL_TABLET | ORAL | Status: DC | PRN

## 2023-01-20 MED ORDER — APIXABAN 5 MG PO TABS
5.0000 mg | ORAL_TABLET | Freq: Two times a day (BID) | ORAL | Status: DC
  Administered 2023-01-20 – 2023-01-22 (×4): 5 mg via ORAL
  Filled 2023-01-20 (×4): qty 1

## 2023-01-20 MED ORDER — ISOSORBIDE MONONITRATE ER 60 MG PO TB24
60.0000 mg | ORAL_TABLET | Freq: Every day | ORAL | Status: DC
  Administered 2023-01-20 – 2023-01-22 (×3): 60 mg via ORAL
  Filled 2023-01-20 (×3): qty 1

## 2023-01-20 MED ORDER — METHOCARBAMOL 500 MG PO TABS
1000.0000 mg | ORAL_TABLET | Freq: Once | ORAL | Status: AC
  Administered 2023-01-20: 1000 mg via ORAL
  Filled 2023-01-20: qty 2

## 2023-01-20 MED ORDER — ENSURE ENLIVE PO LIQD
237.0000 mL | Freq: Two times a day (BID) | ORAL | Status: DC
  Administered 2023-01-20: 237 mL via ORAL
  Filled 2023-01-20: qty 237

## 2023-01-20 MED ORDER — FINASTERIDE 5 MG PO TABS
5.0000 mg | ORAL_TABLET | Freq: Every day | ORAL | Status: DC
  Administered 2023-01-20 – 2023-01-22 (×3): 5 mg via ORAL
  Filled 2023-01-20 (×3): qty 1

## 2023-01-20 MED ORDER — ACETAMINOPHEN 650 MG RE SUPP: 650.0000 mg | Freq: Four times a day (QID) | RECTAL | Status: DC | PRN

## 2023-01-20 MED ORDER — BUPIVACAINE HCL (PF) 0.5 % IJ SOLN
10.0000 mL | Freq: Once | INTRAMUSCULAR | Status: AC
  Administered 2023-01-20: 10 mL
  Filled 2023-01-20: qty 10

## 2023-01-20 MED ORDER — VANCOMYCIN VARIABLE DOSE PER UNSTABLE RENAL FUNCTION (PHARMACIST DOSING): Status: DC

## 2023-01-20 MED ORDER — DICLOFENAC SODIUM 1 % EX GEL: 4.0000 g | Freq: Four times a day (QID) | CUTANEOUS | 0 refills | Status: DC

## 2023-01-20 MED ORDER — CARVEDILOL 25 MG PO TABS
25.0000 mg | ORAL_TABLET | Freq: Two times a day (BID) | ORAL | Status: DC
  Administered 2023-01-21 – 2023-01-22 (×3): 25 mg via ORAL
  Filled 2023-01-20 (×3): qty 1

## 2023-01-20 MED ORDER — HYDRALAZINE HCL 50 MG PO TABS
50.0000 mg | ORAL_TABLET | Freq: Three times a day (TID) | ORAL | Status: DC
  Administered 2023-01-20 – 2023-01-22 (×5): 50 mg via ORAL
  Filled 2023-01-20 (×5): qty 1

## 2023-01-20 MED ORDER — ACETAMINOPHEN 325 MG PO TABS: 650.0000 mg | ORAL_TABLET | Freq: Four times a day (QID) | ORAL | Status: DC | PRN

## 2023-01-20 MED ORDER — METHYLPREDNISOLONE ACETATE 40 MG/ML IJ SUSP
40.0000 mg | Freq: Once | INTRAMUSCULAR | Status: AC
  Administered 2023-01-20: 40 mg via INTRA_ARTICULAR
  Filled 2023-01-20: qty 1

## 2023-01-20 MED ORDER — INSULIN ASPART 100 UNIT/ML IJ SOLN
0.0000 [IU] | Freq: Three times a day (TID) | INTRAMUSCULAR | Status: DC
  Administered 2023-01-20 (×2): 3 [IU] via SUBCUTANEOUS
  Administered 2023-01-21: 5 [IU] via SUBCUTANEOUS
  Administered 2023-01-21 – 2023-01-22 (×3): 3 [IU] via SUBCUTANEOUS
  Administered 2023-01-22: 5 [IU] via SUBCUTANEOUS

## 2023-01-20 MED ORDER — VANCOMYCIN HCL 10 G IV SOLR
2500.0000 mg | Freq: Once | INTRAVENOUS | Status: AC
  Administered 2023-01-20: 2500 mg via INTRAVENOUS
  Filled 2023-01-20: qty 2500

## 2023-01-20 MED ORDER — INSULIN ASPART 100 UNIT/ML IJ SOLN
2.0000 [IU] | INTRAMUSCULAR | Status: AC
  Administered 2023-01-20: 2 [IU] via SUBCUTANEOUS

## 2023-01-20 MED ORDER — SODIUM CHLORIDE 0.9 % IV BOLUS
1000.0000 mL | Freq: Once | INTRAVENOUS | Status: AC
  Administered 2023-01-20: 1000 mL via INTRAVENOUS

## 2023-01-20 MED ORDER — LIDOCAINE 5 % EX PTCH
1.0000 | MEDICATED_PATCH | CUTANEOUS | Status: DC
  Administered 2023-01-20 – 2023-01-22 (×3): 1 via TRANSDERMAL
  Filled 2023-01-20 (×4): qty 1

## 2023-01-20 NOTE — Assessment & Plan Note (Addendum)
-  Hold his ARB/hydrochlorothiazide -continue coreg, imdur, hydralazine

## 2023-01-20 NOTE — Assessment & Plan Note (Signed)
Euvolemic to dry one exam Echo in 4/21: EF returned to normal at 60-65% with no valvular abnormalities  Strict I/O Daily weights

## 2023-01-20 NOTE — Assessment & Plan Note (Addendum)
Seen by ortho and x2 aspiration with negative fluid studies 9/9 Knee xray 01/05/23 with mild OA and moderate effusion  Ortho consulted today with aspiration and steroid injection with studies sent I have labs pending to r/o autoimmune, gout, rheumatoid Met SIRS criteria on arrival with leukocytosis. Will cover for septic arthritis until studies result. Vanc/cefepime ordered  Consider further imaging if no improvement  PT to eval

## 2023-01-20 NOTE — Assessment & Plan Note (Signed)
Continue proscar  ?

## 2023-01-20 NOTE — Consult Note (Signed)
Reason for Consult:Right knee pain Referring Physician: Orland [name] Time called: 2440 Time at bedside: 0854   Johnny [name] is an 67 y.o. male.  HPI: Johnny [name] comes in with about a 3 week hx/o right knee pain. He has been seen twice at [facility] and had two aspirations that showed no e/o crystalline arthropathy or septic joint. The aspirations helped the pain only minimally and did not last. It's gotten worse again and he cannot bear weight.  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   Qualifier: Diagnosis of  By: Debby Bud MD, Rosalyn Gess    Sleep apnea    Type II diabetes mellitus with renal manifestations, uncontrolled 08/03/2007   Qualifier: Diagnosis of  By: Debby Bud MD, Rosalyn Gess  Med metformin     Past Surgical History:  Procedure Laterality Date   CARDIOVERSION N/A 07/18/2019   Procedure: CARDIOVERSION;  Surgeon: Lars Masson, MD;  Location[facility] ENDOSCOPY;  Service: Cardiovascular;  Laterality: N/A;   TEE WITHOUT CARDIOVERSION N/A 07/18/2019   Procedure: TRANSESOPHAGEAL ECHOCARDIOGRAM (TEE);  Surgeon: Lars Masson, MD;  Location: [facility] ENDOSCOPY;  Service: Cardiovascular;  Laterality: N/A;   TOOTH EXTRACTION     XI ROBOTIC ASSISTED SIMPLE PROSTATECTOMY N/A 05/31/2020   Procedure: XI ROBOTIC ASSISTED SIMPLE PROSTATECTOMY-SUPRAPUBIC;  Surgeon: Malen Gauze, MD;  Location: [facility] ORS;  Service: Urology;  Laterality: N/A;    Family History  Problem Relation Age of Onset    Hypertension Father        DOB [id number]   Hyperlipidemia Father    Diabetes Father    Coronary artery disease Father        with stents   Nephrolithiasis Father    Kidney disease Father        s/p rejected kidney transplant; on HD   Memory loss Mother        DOB 22   Kidney disease Mother        s/p nephrectomy, infection problem   Dementia Mother    Cancer Brother        lung   HIV Brother    Prostate cancer Neg Hx    Colon cancer Neg Hx     Social History:  reports that he has never smoked. He has been exposed to tobacco smoke. He has never used smokeless tobacco. He reports that he does not drink alcohol and does not use drugs.  Allergies: No Known Allergies  Medications: I have reviewed the patient's current medications.  Results for orders placed or performed during the hospital encounter of 01/19/23 (from the past 48 hour(s))  Urinalysis, Routine w reflex microscopic -Urine, Clean Catch     Status: Abnormal   Collection Time: 01/19/23  5:02 PM  Result Value Ref Range   Color, Urine YELLOW YELLOW   APPearance CLOUDY (A) CLEAR   Specific Gravity, Urine 1.023 1.005 - 1.030   pH 5.0 5.0 - 8.0  Glucose, UA >=500 (A) NEGATIVE mg/dL   Hgb urine dipstick SMALL (A) NEGATIVE   Bilirubin Urine NEGATIVE NEGATIVE   Ketones, ur 5 (A) NEGATIVE mg/dL   Protein, ur 30 (A) NEGATIVE mg/dL   Nitrite NEGATIVE NEGATIVE   Leukocytes,Ua NEGATIVE NEGATIVE   RBC / HPF 0-5 0 - 5 RBC/hpf   WBC, UA 6-10 0 - 5 WBC/hpf   Bacteria, UA RARE (A) NONE SEEN   Squamous Epithelial / HPF 0-5 0 - 5 /HPF   Mucus PRESENT    Hyaline Casts, UA PRESENT     Comment: Performed at [facility], [address] [place], [place] [address]  CBG monitoring, ED     Status: Abnormal   Collection Time: 01/19/23  5:04 PM  Result Value Ref Range   Glucose-Capillary 240 (H) 70 - 99 mg/dL    Comment: Glucose reference range applies only to samples taken after fasting for at least 8 hours.  CBC     Status:  Abnormal   Collection Time: 01/19/23  5:08 PM  Result Value Ref Range   WBC 13.0 (H) 4.0 - 10.5 K/uL   RBC 4.95 4.22 - 5.81 MIL/uL   Hemoglobin 13.4 13.0 - 17.0 g/dL   HCT 30.8 65.7 - 84.6 %   MCV 84.4 80.0 - 100.0 fL   MCH 27.1 26.0 - 34.0 pg   MCHC 32.1 30.0 - 36.0 g/dL   RDW 96.2 95.2 - 84.1 %   Platelets 574 (H) 150 - 400 K/uL   nRBC 0.0 0.0 - 0.2 %    Comment: Performed at [facility], [address] [place], [place] [address]  Comprehensive metabolic panel     Status: Abnormal   Collection Time: 01/19/23  5:08 PM  Result Value Ref Range   Sodium 133 (L) 135 - 145 mmol/L   Potassium 4.6 3.5 - 5.1 mmol/L   Chloride 99 98 - 111 mmol/L   CO2 14 (L) 22 - 32 mmol/L   Glucose, Bld 250 (H) 70 - 99 mg/dL    Comment: Glucose reference range applies only to samples taken after fasting for at least 8 hours.   BUN 59 (H) 8 - 23 mg/dL   Creatinine, Ser 1.02 (H) 0.61 - 1.24 mg/dL   Calcium 8.9 8.9 - 72.5 mg/dL   Total Protein 8.2 (H) 6.5 - 8.1 g/dL   Albumin 2.2 (L) 3.5 - 5.0 g/dL   AST 39 15 - 41 U/L   ALT 53 (H) 0 - 44 U/L   Alkaline Phosphatase 76 38 - 126 U/L   Total Bilirubin 1.2 0.3 - 1.2 mg/dL   GFR, Estimated 24 (L) >60 mL/min    Comment: (NOTE) Calculated using the CKD-EPI Creatinine Equation (2021)    Anion gap 20 (H) 5 - 15    Comment: ELECTROLYTES REPEATED TO VERIFY Performed at [facility], [address], [place], [place] [address]   I-stat chem 8, ed     Status: Abnormal   Collection Time: 01/19/23  5:25 PM  Result Value Ref Range   Sodium 134 (L) 135 - 145 mmol/L   Potassium 4.4 3.5 - 5.1 mmol/L   Chloride 105 98 - 111 mmol/L   BUN 52 (H) 8 - 23 mg/dL   Creatinine, Ser 0.34 (H) 0.61 - 1.24 mg/dL   Glucose, Bld 742 (H) 70 - 99 mg/dL    Comment: Glucose reference range applies only to samples taken after fasting for at least 8  hours.   Calcium, Ion 1.03 (L) 1.15 - 1.40 mmol/L   TCO2 15 (L) 22 - 32 mmol/L   Hemoglobin 15.3 13.0 - 17.0 g/dL   HCT  40.9 81.1 - 91.4 %  CBG monitoring, ED     Status: Abnormal   Collection Time: 01/19/23 10:25 PM  Result Value Ref Range   Glucose-Capillary 246 (H) 70 - 99 mg/dL    Comment: Glucose reference range applies only to samples taken after fasting for at least 8 hours.  I-stat chem 8, ED (not at [facility], [facility] or [facility])     Status: Abnormal   Collection Time: 01/20/23  5:46 AM  Result Value Ref Range   Sodium 135 135 - 145 mmol/L   Potassium 4.7 3.5 - 5.1 mmol/L   Chloride 104 98 - 111 mmol/L   BUN 89 (H) 8 - 23 mg/dL   Creatinine, Ser 7.82 (H) 0.61 - 1.24 mg/dL   Glucose, Bld 956 (H) 70 - 99 mg/dL    Comment: Glucose reference range applies only to samples taken after fasting for at least 8 hours.   Calcium, Ion 1.05 (L) 1.15 - 1.40 mmol/L   TCO2 20 (L) 22 - 32 mmol/L   Hemoglobin 13.6 13.0 - 17.0 g/dL   HCT 21.3 08.6 - 57.8 %    No results found.  Review of Systems  Constitutional:  Negative for chills, diaphoresis and fever.  HENT:  Negative for ear discharge, ear pain, hearing loss and tinnitus.   Eyes:  Negative for photophobia and pain.  Respiratory:  Negative for cough and shortness of breath.   Cardiovascular:  Negative for chest pain.  Gastrointestinal:  Negative for abdominal pain, nausea and vomiting.  Genitourinary:  Negative for dysuria, flank pain, frequency and urgency.  Musculoskeletal:  Positive for arthralgias (Right knee). Negative for back pain, myalgias and neck pain.  Neurological:  Negative for dizziness and headaches.  Hematological:  Does not bruise/bleed easily.  Psychiatric/Behavioral:  The patient is not nervous/anxious.    Blood pressure (!) 151/79, pulse 88, temperature 99 F (37.2 C), temperature source Oral, resp. rate 16, SpO2 99%. Physical Exam Constitutional:      General: He is not in acute distress.    Appearance: He is well-developed. He is not diaphoretic.  HENT:     Head: Normocephalic and atraumatic.  Eyes:     General: No scleral icterus.        Right eye: No discharge.        Left eye: No discharge.     Conjunctiva/sclera: Conjunctivae normal.  Cardiovascular:     Rate and Rhythm: Normal rate and regular rhythm.  Pulmonary:     Effort: Pulmonary effort is normal. No respiratory distress.  Musculoskeletal:     Cervical back: Normal range of motion.     Comments: RLE No traumatic wounds or ecchymosis, diffuse psoriatic lesions BLE  Mod knee TTP  Mod knee effusion  Sens DPN, SPN, TN intact  Motor EHL, ext, flex, evers 5/5  DP 1+, PT 0, No significant edema  Skin:    General: Skin is warm and dry.  Neurological:     Mental Status: He is alert.  Psychiatric:        Mood and Affect: Mood normal.        Behavior: Behavior normal.     Assessment/Plan: Right knee pain -- Will aspirate and inject with steroid.    Freeman Caldron, PA-C Orthopedic Surgery [phone]  01/20/2023, 9:06 AM

## 2023-01-20 NOTE — Assessment & Plan Note (Signed)
In NSR Continue eliquis and coreg

## 2023-01-20 NOTE — ED Notes (Signed)
ED TO INPATIENT HANDOFF REPORT  ED Nurse Name and Phone #: Lounell Schumacher [phone]   S Name/Age/Gender Johnny [name] 67 y.o. male Room/Bed: 044C/044C  Code Status   Code Status: Full Code  Home/SNF/Other Home Patient oriented to: self, place, time, and situation Is this baseline? Yes   Triage Complete: Triage complete  Chief Complaint Acute renal failure superimposed on stage 3b chronic kidney disease (HCC) [N17.9, N18.32]  Triage Note Pt to ED POV from home. Pt c/o right knee x3 weeks and was supposed to have cortisone injection last week at orthopedic out pt but was told he could not have injection d/t elevated blood sugar. Pt has hx of DM.     Allergies No Known Allergies  Level of Care/Admitting Diagnosis ED Disposition     ED Disposition  Admit   Condition  --   Comment  Hospital Area: [facility] [[id number]]  Level of Care: Telemetry Medical [104]  May place patient in observation at [facility] or [facility] if equivalent level of care is available:: No  Covid Evaluation: Asymptomatic - no recent exposure (last 10 days) testing not required  Diagnosis: Acute renal failure superimposed on stage 3b chronic kidney disease [facility]) [[id number]]  Admitting Physician: Orland Mustard [[id number]]  Attending Physician: Orland Mustard [[id number]]          B Medical/Surgery History 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   Qualifier: Diagnosis of  By: Debby Bud MD,  Rosalyn Gess    Sleep apnea    Type II diabetes mellitus with renal manifestations, uncontrolled 08/03/2007   Qualifier: Diagnosis of  By: Debby Bud MD, Rosalyn Gess  Med metformin    Past Surgical History:  Procedure Laterality Date   CARDIOVERSION N/A 07/18/2019   Procedure: CARDIOVERSION;  Surgeon: Lars Masson, MD;  Location: [facility] ENDOSCOPY;  Service: Cardiovascular;  Laterality: N/A;   TEE WITHOUT CARDIOVERSION N/A 07/18/2019   Procedure: TRANSESOPHAGEAL ECHOCARDIOGRAM (TEE);  Surgeon: Lars Masson, MD;  Location: [facility] ENDOSCOPY;  Service: Cardiovascular;  Laterality: N/A;   TOOTH EXTRACTION     XI ROBOTIC ASSISTED SIMPLE PROSTATECTOMY N/A 05/31/2020   Procedure: XI ROBOTIC ASSISTED SIMPLE PROSTATECTOMY-SUPRAPUBIC;  Surgeon: Malen Gauze, MD;  Location: [facility] ORS;  Service: Urology;  Laterality: N/A;     A IV Location/Drains/Wounds Patient Lines/Drains/Airways Status     Active Line/Drains/Airways     Name Placement date Placement time Site Days   Peripheral IV 01/20/23 20 G Anterior;Distal;Left;Upper Arm 01/20/23  0422  Arm  less than 1            Intake/Output Last 24 hours  Intake/Output Summary (Last 24 hours) at 01/20/2023 1608 Last data filed at 01/20/2023 0715 Gross per 24 hour  Intake 1000 ml  Output 350 ml  Net 650 ml    Labs/Imaging Results for orders placed or performed during the hospital encounter of 01/19/23 (from the past 48 hour(s))  Urinalysis, Routine w reflex microscopic -Urine, Clean Catch     Status: Abnormal   Collection Time: 01/19/23  5:02 PM  Result Value Ref Range   Color, Urine YELLOW YELLOW   APPearance CLOUDY (A) CLEAR   Specific Gravity, Urine 1.023 1.005 - 1.030   pH 5.0 5.0 - 8.0   Glucose, UA >=500 (A) NEGATIVE mg/dL   Hgb urine dipstick SMALL (A) NEGATIVE   Bilirubin Urine NEGATIVE NEGATIVE   Ketones, ur 5 (A) NEGATIVE mg/dL   Protein, ur 30 (A) NEGATIVE mg/dL   Nitrite NEGATIVE NEGATIVE   Leukocytes,Ua NEGATIVE NEGATIVE    RBC / HPF 0-5 0 - 5 RBC/hpf   WBC, UA 6-10 0 - 5 WBC/hpf   Bacteria, UA RARE (A) NONE SEEN   Squamous Epithelial / HPF 0-5 0 - 5 /HPF   Mucus PRESENT    Hyaline Casts, UA PRESENT     Comment: Performed at [facility], [address], [place], [place] [address]  CBG monitoring, ED     Status: Abnormal   Collection Time: 01/19/23  5:04 PM  Result Value Ref Range   Glucose-Capillary 240 (H) 70 - 99 mg/dL    Comment: Glucose reference range applies only to samples taken after fasting for at least 8 hours.  CBC     Status: Abnormal   Collection Time: 01/19/23  5:08 PM  Result Value Ref Range   WBC 13.0 (H) 4.0 - 10.5 K/uL   RBC 4.95 4.22 - 5.81 MIL/uL   Hemoglobin 13.4 13.0 - 17.0 g/dL   HCT 69.6 78.9 - 38.1 %   MCV 84.4 80.0 - 100.0 fL   MCH 27.1 26.0 - 34.0 pg   MCHC 32.1 30.0 - 36.0 g/dL   RDW 01.7 51.0 - 25.8 %   Platelets 574 (H) 150 - 400 K/uL   nRBC 0.0 0.0 - 0.2 %    Comment: Performed at [facility], [address], [place], [place] [address]  Comprehensive metabolic panel     Status: Abnormal   Collection Time: 01/19/23  5:08 PM  Result Value Ref Range   Sodium 133 (L) 135 - 145 mmol/L   Potassium 4.6 3.5 - 5.1 mmol/L   Chloride 99 98 - 111 mmol/L   CO2 14 (L) 22 - 32 mmol/L   Glucose, Bld 250 (H) 70 - 99 mg/dL    Comment: Glucose reference range applies only to samples taken after fasting for at least 8 hours.   BUN 59 (H) 8 - 23 mg/dL   Creatinine, Ser 2.42 (H) 0.61 - 1.24 mg/dL   Calcium 8.9 8.9 - 35.3 mg/dL   Total Protein 8.2 (H) 6.5 - 8.1 g/dL   Albumin 2.2 (L) 3.5 - 5.0 g/dL   AST 39 15 - 41 U/L   ALT 53 (H) 0 - 44 U/L   Alkaline Phosphatase 76 38 - 126 U/L   Total Bilirubin 1.2 0.3 - 1.2 mg/dL   GFR, Estimated 24 (L) >60 mL/min    Comment: (NOTE) Calculated using the CKD-EPI Creatinine Equation (2021)    Anion gap 20 (H) 5 - 15    Comment: ELECTROLYTES REPEATED TO VERIFY Performed at [facility], [address] [place], [place]  [address]   I-stat chem 8, ed     Status: Abnormal   Collection Time: 01/19/23  5:25 PM  Result Value Ref Range   Sodium 134 (L) 135 - 145 mmol/L   Potassium 4.4 3.5 -  5.1 mmol/L   Chloride 105 98 - 111 mmol/L   BUN 52 (H) 8 - 23 mg/dL   Creatinine, Ser 1.61 (H) 0.61 - 1.24 mg/dL   Glucose, Bld 096 (H) 70 - 99 mg/dL    Comment: Glucose reference range applies only to samples taken after fasting for at least 8 hours.   Calcium, Ion 1.03 (L) 1.15 - 1.40 mmol/L   TCO2 15 (L) 22 - 32 mmol/L   Hemoglobin 15.3 13.0 - 17.0 g/dL   HCT 04.5 40.9 - 81.1 %  CBG monitoring, ED     Status: Abnormal   Collection Time: 01/19/23 10:25 PM  Result Value Ref Range   Glucose-Capillary 246 (H) 70 - 99 mg/dL    Comment: Glucose reference range applies only to samples taken after fasting for at least 8 hours.  I-stat chem 8, ED (not at [facility] or [facility])     Status: Abnormal   Collection Time: 01/20/23  5:46 AM  Result Value Ref Range   Sodium 135 135 - 145 mmol/L   Potassium 4.7 3.5 - 5.1 mmol/L   Chloride 104 98 - 111 mmol/L   BUN 89 (H) 8 - 23 mg/dL   Creatinine, Ser 9.14 (H) 0.61 - 1.24 mg/dL   Glucose, Bld 782 (H) 70 - 99 mg/dL    Comment: Glucose reference range applies only to samples taken after fasting for at least 8 hours.   Calcium, Ion 1.05 (L) 1.15 - 1.40 mmol/L   TCO2 20 (L) 22 - 32 mmol/L   Hemoglobin 13.6 13.0 - 17.0 g/dL   HCT 95.6 21.3 - 08.6 %  Uric acid     Status: Abnormal   Collection Time: 01/20/23 10:44 AM  Result Value Ref Range   Uric Acid, Serum 9.8 (H) 3.7 - 8.6 mg/dL    Comment: HEMOLYSIS AT THIS LEVEL MAY AFFECT RESULT Performed at [facility], [address] [place], [place] [address]   Sedimentation rate     Status: Abnormal   Collection Time: 01/20/23 10:44 AM  Result Value Ref Range   Sed Rate 120 (H) 0 - 16 mm/hr    Comment: Performed at [facility], [address] [place], [place] [address]  C-reactive protein     Status: Abnormal   Collection  Time: 01/20/23 10:44 AM  Result Value Ref Range   CRP 40.2 (H) <1.0 mg/dL    Comment: Performed at [facility], [address] [place], [place] [address]  Synovial cell count + diff, w/ crystals     Status: Abnormal   Collection Time: 01/20/23 10:44 AM  Result Value Ref Range   Color, Synovial YELLOW (A) YELLOW   Appearance-Synovial TURBID (A) CLEAR   Crystals, Fluid NO CRYSTALS SEEN    WBC, Synovial 14,875 (H) 0 - 200 /cu mm   Neutrophil, Synovial 95 (H) 0 - 25 %   Lymphocytes-Synovial Fld 2 0 - 20 %   Monocyte-Macrophage-Synovial Fluid 3 (L) 50 - 90 %   Eosinophils-Synovial 0 0 - 1 %    Comment: Performed at [facility], [address] [place], [place] [address]  Body fluid culture w Gram Stain     Status: None (Preliminary result)   Collection Time: 01/20/23 11:25 AM   Specimen: Synovium; Body Fluid  Result Value Ref Range   Specimen Description SYNOVIAL    Special Requests RT KNEE    Gram Stain      FEW WBC  PRESENT, PREDOMINANTLY PMN NO ORGANISMS SEEN Performed at [facility], [address] [place], [place] [address]    Culture PENDING    Report Status PENDING   CBG monitoring, ED     Status: Abnormal   Collection Time: 01/20/23 11:50 AM  Result Value Ref Range   Glucose-Capillary 240 (H) 70 - 99 mg/dL    Comment: Glucose reference range applies only to samples taken after fasting for at least 8 hours.   No results found.  Pending Labs Unresulted Labs (From admission, onward)     Start     Ordered   01/21/23 0500  Basic metabolic panel  Tomorrow morning,   R        01/20/23 1206   01/21/23 0500  CBC  Tomorrow morning,   R        01/20/23 1206   01/20/23 1224  Lactic acid, plasma  (Lactic Acid)  STAT Now then every 3 hours,   R (with STAT occurrences)      01/20/23 1223   01/20/23 1219  Sodium, urine, random  Once,   R        01/20/23 1218   01/20/23 1219  Creatinine, urine, random  Once,   R        01/20/23 1218   01/20/23 1206  HIV  Antibody (routine testing w rflx)  (HIV Antibody (Routine testing w reflex) panel)  Once,   R        01/20/23 1206   01/20/23 1204  Basic metabolic panel  Once,   R        01/20/23 1203   01/20/23 0817  ANA w/Reflex if Positive  Once,   URGENT        01/20/23 0816   01/20/23 0817  Rheumatoid factor  Once,   URGENT        01/20/23 0816   01/20/23 0817  Culture, blood (Routine X 2) w Reflex to ID Panel  BLOOD CULTURE X 2,   R (with STAT occurrences)      01/20/23 0816            Vitals/Pain Today's Vitals   01/20/23 0947 01/20/23 1300 01/20/23 1400 01/20/23 1516  BP: (!) 151/90 (!) 152/93 (!) 141/86   Pulse: 86 93 91   Resp: (!) 21 (!) 25 (!) 30   Temp: 98.9 F (37.2 C)   98.5 F (36.9 C)  TempSrc: Oral   Oral  SpO2: 100% 99% 91%   PainSc:        Isolation Precautions No active isolations  Medications Medications  diclofenac Sodium (VOLTAREN) 1 % topical gel 4 g (0 g Topical Hold 01/20/23 0647)  lidocaine (LIDODERM) 5 % 1 patch (1 patch Transdermal Patch Applied 01/20/23 0421)  0.9 %  sodium chloride infusion ( Intravenous Rate/Dose Change 01/20/23 1532)  insulin glargine-yfgn (SEMGLEE) injection 5 Units (5 Units Subcutaneous Given 01/20/23 1200)  insulin aspart (novoLOG) injection 0-9 Units (3 Units Subcutaneous Given 01/20/23 1326)  acetaminophen (TYLENOL) tablet 650 mg (has no administration in time range)    Or  acetaminophen (TYLENOL) suppository 650 mg (has no administration in time range)  ondansetron (ZOFRAN) tablet 4 mg (has no administration in time range)    Or  ondansetron (ZOFRAN) injection 4 mg (has no administration in time range)  ceFEPIme (MAXIPIME) 2 g in sodium chloride 0.9 % 100 mL IVPB (0 g Intravenous Stopped 01/20/23 1359)  vancomycin (VANCOCIN) 2,500 mg in sodium chloride 0.9 % 500  mL IVPB (2,500 mg Intravenous New Bag/Given 01/20/23 1442)  vancomycin variable dose per unstable renal function (pharmacist dosing) (has no administration in time range)   feeding supplement (ENSURE ENLIVE / ENSURE PLUS) liquid 237 mL (237 mLs Oral Given 01/20/23 1510)  methocarbamol (ROBAXIN) tablet 1,000 mg (1,000 mg Oral Given 01/20/23 0420)  sodium chloride 0.9 % bolus 1,000 mL (0 mLs Intravenous Stopped 01/20/23 0715)  fentaNYL (SUBLIMAZE) injection 25 mcg (25 mcg Intravenous Given 01/20/23 0647)  insulin aspart (novoLOG) injection 2 Units (2 Units Subcutaneous Given 01/20/23 0651)  methylPREDNISolone acetate (DEPO-MEDROL) injection 40 mg (40 mg Intra-articular Given 01/20/23 1122)  bupivacaine(PF) (MARCAINE) 0.5 % injection 10 mL (10 mLs Infiltration Given 01/20/23 1121)    Mobility walks with device     Focused Assessments Musculoskeletal    R Recommendations: See Admitting Provider Note  Report given to:   Additional Notes:

## 2023-01-20 NOTE — Progress Notes (Signed)
Orthopedic Tech Progress Note Patient Details:  Johnny [name] May 06, 1955 [id number]  Ortho Devices Type of Ortho Device: Knee Immobilizer Ortho Device/Splint Location: rle Ortho Device/Splint Interventions: Ordered, Application, Adjustment  I applied the brace and taught the patient how to apply it. Then I tried to get the patient up to walk and they werent able to bear weight. I told the dr about this. They said the ed staff would take care of it. Post Interventions Patient Tolerated: Well Instructions Provided: Care of device, Adjustment of device  Trinna Post 01/20/2023, 5:23 AM

## 2023-01-20 NOTE — Assessment & Plan Note (Signed)
Continue cpap at night  

## 2023-01-20 NOTE — H&P (Signed)
History and Physical    Patient: Johnny [name] ZOX:[id number] DOB: Mar 09, 1956 DOA: 01/19/2023 DOS: the patient was seen and examined on 01/20/2023 PCP: Myrlene Broker, MD  Patient coming from: Home - lives with his wife. He has  needed a walker and cane for the past 3 weeks due to knee pain, but typically ambulates independently    Chief Complaint: knee pain  HPI: Johnny [name] is a 67 y.o. male with medical history significant of  of T2DM, psoriasis, HTN and OSA (Dx 08/2019), Atrial fibrillation, HTN, CKD stage 3 who presented to ED with complaints of worsening right knee pain.   He states his right knee started to hurt around Labor Day. He has had no issues before then.  He was seen in the ED on 9/2 for atrial fib and was cardioverted and sent home. He also had complaints of knee pain and he followed up with ortho. He saw ortho on 9/6 and had his knee drained at this appointment. He then followed back up on 9/9 and had another aspiration with 90ml blood tinged fluid. Aspiration studies negative. He states yesterday he had an appointment with his PCP at 3:00pm. He states he was in the bathroom and couldn't get up.  His right leg just wouldn't work. They called EMS. He has not had much of an appetite, but has been drinking well. No N/V. No fever/chills at home. He denies any trauma to the knee. Had 3 bee stings before this began.   Denies any fever/chills, vision changes/headaches, chest pain or palpitations, shortness of breath or cough, abdominal pain, N/V/D, dysuria or leg swelling.      He does not smoke or drink alcohol.   ER Course:  vitals: temp: 99.4, bp: 158/87, HR: 105, RR: 18, oxygen: 95%RA Pertinent labs: WBC: 13.0, platelets 574, CO2: 14, glucose: 250, BUN: 59, creatinine: 2.80, albumin: 2.2, AG: 20,  Right knee xray: mild OA with moderate knee joint effusion In ED: ortho consulted. Given 1L IVF bolus, robaxin, lidocaine patch and voltaren gel. Given 2 units of novolog.  TRH asked to admit.    Review of Systems: As mentioned in the history of present illness. All other systems reviewed and are negative. 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   Qualifier: Diagnosis of  By: Debby Bud MD, Rosalyn Gess    Sleep apnea    Type II diabetes mellitus with renal manifestations, uncontrolled 08/03/2007   Qualifier: Diagnosis of  By: Debby Bud MD, Rosalyn Gess  Med metformin    Past Surgical History:  Procedure Laterality Date   CARDIOVERSION N/A 07/18/2019   Procedure: CARDIOVERSION;  Surgeon: Lars Masson, MD;  Location: [facility] ENDOSCOPY;  Service: Cardiovascular;  Laterality: N/A;   TEE WITHOUT CARDIOVERSION N/A 07/18/2019   Procedure: TRANSESOPHAGEAL ECHOCARDIOGRAM (TEE);  Surgeon: Lars Masson, MD;  Location: [facility] ENDOSCOPY;  Service: Cardiovascular;  Laterality: N/A;   TOOTH EXTRACTION  XI ROBOTIC ASSISTED SIMPLE PROSTATECTOMY N/A 05/31/2020   Procedure: XI ROBOTIC ASSISTED SIMPLE PROSTATECTOMY-SUPRAPUBIC;  Surgeon: Malen Gauze, MD;  Location: [facility] ORS;  Service: Urology;  Laterality: N/A;   Social History:  reports that he has never smoked. He has been exposed to tobacco smoke. He has never used smokeless tobacco. He reports that he does not drink alcohol and does not use drugs.  No Known Allergies  Family History  Problem Relation Age of Onset   Hypertension Father        DOB [birth year]   Hyperlipidemia Father    Diabetes Father    Coronary artery disease  Father        with stents   Nephrolithiasis Father    Kidney disease Father        s/p rejected kidney transplant; on HD   Memory loss Mother        DOB 23   Kidney disease Mother        s/p nephrectomy, infection problem   Dementia Mother    Cancer Brother        lung   HIV Brother    Prostate cancer Neg Hx    Colon cancer Neg Hx     Prior to Admission medications   Medication Sig Start Date End Date Taking? Authorizing Provider  carvedilol (COREG) 25 MG tablet Take 1 tablet (25 mg total) by mouth 2 (two) times daily with a meal. 01/10/22 01/20/23 Yes Myrlene Broker, MD  ELIQUIS 5 MG TABS tablet TAKE 1 TABLET BY MOUTH TWICE A DAY 09/15/22  Yes Turner, Cornelious Bryant, MD  empagliflozin (JARDIANCE) 25 MG TABS tablet Take 1 tablet (25 mg total) by mouth daily before breakfast. 10/21/22  Yes Shamleffer, Konrad Dolores, MD  finasteride (PROSCAR) 5 MG tablet TAKE 1 TABLET (5 MG TOTAL) BY MOUTH DAILY. 06/24/22  Yes McKenzie, Mardene Celeste, MD  glipiZIDE (GLUCOTROL) 5 MG tablet Take 1 tablet (5 mg total) by mouth 2 (two) times daily before a meal. 12/01/22  Yes Shamleffer, Konrad Dolores, MD  hydrALAZINE (APRESOLINE) 50 MG tablet Take 1.5 tablets (75 mg total) by mouth 3 (three) times daily. Patient taking differently: Take 50 mg by mouth 3 (three) times daily. 01/23/22  Yes Dyann Kief, PA-C  HYDROcodone-acetaminophen (NORCO/VICODIN) 5-325 MG tablet Take 1 tablet by mouth every 4 (four) hours as needed for moderate pain. 01/12/23 01/12/24 Yes Persons, [place], [place]  isosorbide mononitrate (IMDUR) 60 MG 24 hr tablet TAKE 1 TABLET BY MOUTH EVERY DAY 12/01/22  Yes Myrlene Broker, MD  olmesartan-hydrochlorothiazide (BENICAR HCT) 40-12.5 MG tablet TAKE 1 TABLET BY MOUTH EVERY DAY 09/01/22  Yes Quintella Reichert, MD  Oral Electrolytes (EMERGEN-C ELECTRO MIX) PACK Take 2 packets by mouth daily.   Yes [provider]  tirzepatide Greggory Keen) 5 MG/0.5ML Pen Inject 5 mg into the skin once a week.  12/01/22  Yes Shamleffer, Konrad Dolores, MD  Turmeric (QC TUMERIC COMPLEX) 500 MG CAPS Take 1,000 mg by mouth daily.   Yes [provider]    Physical Exam: Vitals:   01/20/23 0947 01/20/23 1300 01/20/23 1400 01/20/23 1516  BP: (!) 151/90 (!) 152/93 (!) 141/86   Pulse: 86 93 91   Resp: (!) 21 (!) 25 (!) 30   Temp: 98.9 F (37.2 C)   98.5 F (36.9 C)  TempSrc: Oral   Oral  SpO2: 100% 99% 91%    General:  Appears calm and comfortable and is in NAD Eyes:  PERRL,  EOMI, normal lids, iris ENT:  grossly normal hearing, lips & tongue, mmm; appropriate dentition Neck:  no LAD, masses or thyromegaly; no carotid bruits Cardiovascular:  RRR, no m/r/g. No LE edema.  Respiratory:   CTA bilaterally with no wheezes/rales/rhonchi.  Normal respiratory effort. Abdomen:  soft, NT, ND, NABS Back:   normal alignment, no CVAT Skin:  no rash or induration seen on limited exam. Vitiligo  Musculoskeletal:  grossly normal tone BUE/BLE, good ROM,  Right knee: edematous with decreased ROM on flexion and extension. No erythema.  Lower extremity:  No LE edema.  Limited foot exam with no ulcerations.  2+ distal pulses. Psychiatric:  grossly normal mood and affect, speech fluent and appropriate, AOx3 Neurologic:  CN 2-12 grossly intact, moves all extremities in coordinated fashion, sensation intact   Radiological Exams on Admission: Independently reviewed - see discussion in A/P where applicable  No results found.  EKG: Independently reviewed.  NSR with rate 88; nonspecific ST changes with no evidence of acute ischemia   Labs on Admission: I have personally reviewed the available labs and imaging studies at the time of the admission.  Pertinent labs:   WBC: 13.0,  platelets 574,  CO2: 14,  glucose: 250,  BUN: 59, creatinine: 2.80,  albumin: 2.2,  AG: 20,  Assessment and Plan: Principal Problem:   Acute renal failure superimposed on stage 3b chronic kidney disease (HCC) Active  Problems:   Effusion of right knee   Sepsis (HCC)   Chronic systolic CHF (congestive heart failure) (HCC)   Typical atrial flutter (HCC)   Type 2 diabetes with complication (HCC)   Essential hypertension   BPH with obstruction/lower urinary tract symptoms   OSA (obstructive sleep apnea)    Assessment and Plan: * Acute renal failure superimposed on stage 3b chronic kidney disease (HCC) 67 year old presenting for worsening knee pain and weakness found to have acute on chronic kidney disease in setting of recent initiation of jardiance.  -obs to tele  Baseline creatinine appears to be around 2.0 He was recently started on jardiance and could be contributing Continue gentle IVF  UA with 0-5 RBC, 5 ketones  Check urine studies Strict I/O Hold nephrotoxic drugs/jardiance Repeat BMP pending, if trending upward will check renal US    Effusion of right knee Seen by ortho and x2 aspiration with negative fluid studies 9/9 Knee xray 01/05/23 with mild OA and moderate effusion  Ortho consulted today with aspiration and steroid injection with studies sent I have labs pending to r/o autoimmune, gout, rheumatoid Met SIRS criteria on arrival with leukocytosis. Will cover for septic arthritis until studies result. Vanc/cefepime ordered  Consider further imaging if no improvement  PT to eval   Sepsis (HCC) Met SIRS criteria on admit with leukocytosis and tachycardia with possible source of his knee  Aspiration done on knee and studies sent again  [facility] obtained, lactic acid pending Will start on vanc/cefepime until synovial fluid results  Trend CBC   Chronic systolic CHF (congestive heart failure) (HCC) Euvolemic to dry one exam Echo in 4/21: EF returned to normal at 60-65% with no valvular abnormalities  Strict I/O Daily weights   Typical atrial flutter (HCC) In NSR Continue eliquis and coreg   Type 2 diabetes with complication (HCC) A1C of 9.8 in July of 2024 Sugar mildly elevated  today to 250 Hold his glipizide and mounjaro Hold jardiance in setting of AKI Start SSI and low dose long acting insulin   Essential hypertension -Hold his  ARB/hydrochlorothiazide -continue coreg, imdur, hydralazine  BPH with obstruction/lower urinary tract symptoms Continue proscar   OSA (obstructive sleep apnea) Continue cpap at night    Advance Care Planning:   Code Status: Full Code   Consults: ortho   DVT Prophylaxis: eliquis   Family Communication: wife at bedside   Severity of Illness: The appropriate patient status for this patient is OBSERVATION. Observation status is judged to be reasonable and necessary in order to provide the required intensity of service to ensure the patient's safety. The patient's presenting symptoms, physical exam findings, and initial radiographic and laboratory data in the context of their medical condition is felt to place them at decreased risk for further clinical deterioration. Furthermore, it is anticipated that the patient will be medically stable for discharge from the hospital within 2 midnights of admission.   Author: Orland Mustard, MD 01/20/2023 4:00 PM  For on call review [email or web address].

## 2023-01-20 NOTE — Procedures (Signed)
Procedure: Right knee aspiration and injection   Indication: Right knee effusion(s)   Surgeon: Charma Igo, PA-C   Assist: None   Anesthesia: Topical refrigerant   EBL: None   Complications: None   Findings: After risks/benefits explained patient desires to undergo procedure. Consent obtained and time out performed. The right knee was sterilely prepped and aspirated. 88ml opaque yellow fluid obtained. 6ml 0.5% Marcaine and 40mg  depo-medrol instilled. Pt tolerated the procedure well.       Freeman Caldron, PA-C Orthopedic Surgery [phone]

## 2023-01-20 NOTE — Hospital Course (Signed)
  67 year old man history of paroxysmal fibrillation on Eliquis, CKD stage 3b, DM2, HTN, HLD, chronic systolic CHF (EF 8/65/7846 was 25-30% but resolved on echo 08/2019 to 60-65%, chronic knee effusion sees outpatient Ortho for frequent tapping present to the emergency department evaluation for right-sided knee pain - ED physician Dr. Daun Peacock  will consult orthopedic for evaluation for knee effusion. - Patient also found to have acute kidney injury and also some elevated blood glucose. In the ED patient has been resuscitated with 1 L of fluid even with that creatinine has been trended up to 2.8 to 3.3 and BUN trended up 52 to 89. -Dr. Nicanor Alcon planning to continue IV fluid and for elevated blood glucose she will start insulin regimen. -Requesting admission for management of acute kidney injury.

## 2023-01-20 NOTE — Inpatient Diabetes Management (Addendum)
Inpatient Diabetes Program Recommendations  AACE/ADA: New Consensus Statement on Inpatient Glycemic Control (2015)  Target Ranges:  Prepandial:   less than 140 mg/dL      Peak postprandial:   less than 180 mg/dL (1-2 hours)      Critically ill patients:  140 - 180 mg/dL   Lab Results  Component Value Date   GLUCAP 246 (H) 01/19/2023   HGBA1C 9.8 (A) 12/01/2022    Review of Glycemic Control  Latest Reference Range & Units 01/19/23 17:04 01/19/23 22:25  Glucose-Capillary 70 - 99 mg/dL 161 (H) 096 (H)   Diabetes history: DM type 2 Outpatient Diabetes medications:  Jardiance 25 mg Daily, Glipizide 5 mg bid, Mounjaro 5 mg weekly Current orders for Inpatient glycemic control:  Semglee 5 units Daily Novolog 0-9 units tid  A1c 9.8% on 7/29 Solumedrol 40 mg x1 dose  Dr. Lonzo Cloud, Endocrinology last visit on 7/29 Trulicity switched to [facility] and increase in dose of glipizide.   Inpatient Diabetes Program Recommendations:    -   Increase Semglee to 10 units -   Add Novolog 3 units tid meal coverage if eating >50% of meals   Spoke with pt and wife at bedside regarding A1c level 2 months ago. Pt has not seen an improvement with his glucose trends since July, however, pt has been having a lot of knee pain. Discussed glucose and A1c goals and encouraged them to contact the Endocrinology office for titration, especially since pt received steroid injection. They are requesting diet information to assist in glycemic management at home. Will follow glucose trends closely while here.  Thanks,  Christena Deem RN, MSN, BC-ADM Inpatient Diabetes Coordinator Team Pager [phone] (8a-5p)

## 2023-01-20 NOTE — Assessment & Plan Note (Signed)
Met SIRS criteria on admit with leukocytosis and tachycardia with possible source of his knee  Aspiration done on knee and studies sent again  [facility] obtained, lactic acid pending Will start on vanc/cefepime until synovial fluid results  Trend CBC

## 2023-01-20 NOTE — ED Provider Notes (Signed)
[place] EMERGENCY DEPARTMENT AT [facility] Provider Note   CSN: [id number] Arrival date & time: 01/19/23  1545     History  Chief Complaint  Patient presents with   Knee Pain   Hyperglycemia    Johnny [name] is a 67 y.o. male.  The history is provided by the patient.  Knee Pain Location:  Knee Time since incident:  3 weeks Knee location:  R knee Pain details:    Quality:  Aching   Severity:  Severe   Timing:  Constant   Progression:  Unchanged Chronicity:  New Foreign body present:  No foreign bodies Relieved by:  Nothing Worsened by:  Nothing Ineffective treatments: oral narcotics and multiple knee. Associated symptoms: no fever   Risk factors: no concern for non-accidental trauma   Hyperglycemia Associated symptoms: no fever and no vomiting   Patient with DM on ELiquis presents with ongoing R knee pain and swelling.  It has been tapped 3 times by orthopedics and studies are pending but pain is worsening and glucose has been out of control so orthopedics has not been able to do injections.      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  Authorizing Provider  diclofenac Sodium (VOLTAREN) 1 % GEL Apply 4 g topically 4 (four) times daily. 01/20/23  Yes Dillian Feig, MD  lidocaine (LIDODERM) 5 % Place 1 patch onto the skin daily. Remove & Discard patch within 12 hours or as directed by MD 01/20/23  Yes Magda Muise, MD  carvedilol (COREG) 25 MG tablet Take 1 tablet (25 mg total) by mouth 2 (two) times daily with a meal. 01/10/22 01/13/23  Myrlene Broker, MD  ELIQUIS 5 MG TABS tablet TAKE 1 TABLET BY MOUTH TWICE A DAY 09/15/22   Quintella Reichert, MD  empagliflozin (JARDIANCE) 25 MG TABS tablet Take 1 tablet (25 mg total) by mouth daily before breakfast. 10/21/22   Shamleffer, Konrad Dolores, MD  finasteride (PROSCAR) 5 MG tablet TAKE 1 TABLET (5 MG TOTAL) BY MOUTH DAILY. 06/24/22   McKenzie, Mardene Celeste, MD  glipiZIDE (GLUCOTROL) 5 MG tablet Take 1 tablet (5 mg total) by mouth 2 (two) times daily before a meal. 12/01/22   Shamleffer, Konrad Dolores, MD  hydrALAZINE (APRESOLINE) 50 MG tablet Take 1.5 tablets (75 mg total) by mouth 3 (three) times daily. Patient taking differently: Take 50 mg by mouth 3 (three) times daily. Taking 50 mg by mouth TID 01/23/22   Dyann Kief, PA-C  HYDROcodone-acetaminophen (NORCO/VICODIN) 5-325 MG tablet Take 1 tablet by  mouth every 4 (four) hours as needed for moderate pain. 01/12/23 01/12/24  Persons, [place], PA  isosorbide mononitrate (IMDUR) 60 MG 24 hr tablet TAKE 1 TABLET BY MOUTH EVERY DAY 12/01/22   Myrlene Broker, MD  olmesartan-hydrochlorothiazide (BENICAR HCT) 40-12.5 MG tablet TAKE 1 TABLET BY MOUTH EVERY DAY 09/01/22   Quintella Reichert, MD  Oral Electrolytes (EMERGEN-C ELECTRO MIX) PACK Take 2 packets by mouth as needed.    [provider]  tirzepatide Greggory Keen) 5 MG/0.5ML Pen Inject 5 mg into the skin once a week. 12/01/22   Shamleffer, Konrad Dolores, MD  Turmeric (QC TUMERIC COMPLEX) 500 MG CAPS Take 1,000 mg by mouth daily.    [provider]      Allergies     Patient has no known allergies.    Review of Systems   Review of Systems  Constitutional:  Negative for fever.  Gastrointestinal:  Negative for vomiting.  Musculoskeletal:  Positive for arthralgias and joint swelling.  All other systems reviewed and are negative.   Physical Exam Updated Vital Signs BP (!) 161/83 (BP Location: Left Arm)   Pulse 95   Temp 99.3 F (37.4 C)   Resp 18   SpO2 98%  Physical Exam Vitals and nursing note reviewed.  Constitutional:      General: He is not in acute distress.    Appearance: Normal appearance. He is well-developed. He is not diaphoretic.  HENT:     Head: Normocephalic and atraumatic.     Nose: Nose normal.  Eyes:     Conjunctiva/sclera: Conjunctivae normal.     Pupils: Pupils are equal, round, and reactive to light.  Cardiovascular:     Rate and Rhythm: Normal rate and regular rhythm.     Pulses: Normal pulses.     Heart sounds: Normal heart sounds.  Pulmonary:     Effort: Pulmonary effort is normal.     Breath sounds: Normal breath sounds. No wheezing or rales.  Abdominal:     General: Bowel sounds are normal.     Palpations: Abdomen is soft.     Tenderness: There is no abdominal tenderness. There is no guarding or rebound.  Musculoskeletal:        General: Normal range of motion.     Cervical back: Normal range of motion and neck supple.     Right knee: Effusion present. No deformity, erythema or ecchymosis. No LCL laxity, MCL laxity or ACL laxity.     Right ankle: Normal.     Right Achilles Tendon: Normal.     Right foot: Normal.  Skin:    General: Skin is warm and dry.     Capillary Refill: Capillary refill takes less than 2 seconds.  Neurological:     General: No focal deficit present.     Mental Status: He is alert and oriented to person, place, and time.     ED Results / Procedures / Treatments   Labs (all labs ordered are listed, but only abnormal results are displayed) Results for orders placed or performed  during the hospital encounter of 01/19/23  CBC  Result Value Ref Range   WBC 13.0 (H) 4.0 - 10.5 K/uL   RBC 4.95 4.22 - 5.81 MIL/uL   Hemoglobin 13.4 13.0 - 17.0 g/dL   HCT 16.1 09.6 - 04.5 %   MCV 84.4 80.0 - 100.0 fL   MCH 27.1 26.0 - 34.0 pg   MCHC 32.1 30.0 - 36.0 g/dL   RDW 14.7  11.5 - 15.5 %   Platelets 574 (H) 150 - 400 K/uL   nRBC 0.0 0.0 - 0.2 %  Urinalysis, Routine w reflex microscopic -Urine, Clean Catch  Result Value Ref Range   Color, Urine YELLOW YELLOW   APPearance CLOUDY (A) CLEAR   Specific Gravity, Urine 1.023 1.005 - 1.030   pH 5.0 5.0 - 8.0   Glucose, UA >=500 (A) NEGATIVE mg/dL   Hgb urine dipstick SMALL (A) NEGATIVE   Bilirubin Urine NEGATIVE NEGATIVE   Ketones, ur 5 (A) NEGATIVE mg/dL   Protein, ur 30 (A) NEGATIVE mg/dL   Nitrite NEGATIVE NEGATIVE   Leukocytes,Ua NEGATIVE NEGATIVE   RBC / HPF 0-5 0 - 5 RBC/hpf   WBC, UA 6-10 0 - 5 WBC/hpf   Bacteria, UA RARE (A) NONE SEEN   Squamous Epithelial / HPF 0-5 0 - 5 /HPF   Mucus PRESENT    Hyaline Casts, UA PRESENT   Comprehensive metabolic panel  Result Value Ref Range   Sodium 133 (L) 135 - 145 mmol/L   Potassium 4.6 3.5 - 5.1 mmol/L   Chloride 99 98 - 111 mmol/L   CO2 14 (L) 22 - 32 mmol/L   Glucose, Bld 250 (H) 70 - 99 mg/dL   BUN 59 (H) 8 - 23 mg/dL   Creatinine, Ser 2.95 (H) 0.61 - 1.24 mg/dL   Calcium 8.9 8.9 - 62.1 mg/dL   Total Protein 8.2 (H) 6.5 - 8.1 g/dL   Albumin 2.2 (L) 3.5 - 5.0 g/dL   AST 39 15 - 41 U/L   ALT 53 (H) 0 - 44 U/L   Alkaline Phosphatase 76 38 - 126 U/L   Total Bilirubin 1.2 0.3 - 1.2 mg/dL   GFR, Estimated 24 (L) >60 mL/min   Anion gap 20 (H) 5 - 15  CBG monitoring, ED  Result Value Ref Range   Glucose-Capillary 240 (H) 70 - 99 mg/dL  I-stat chem 8, ed  Result Value Ref Range   Sodium 134 (L) 135 - 145 mmol/L   Potassium 4.4 3.5 - 5.1 mmol/L   Chloride 105 98 - 111 mmol/L   BUN 52 (H) 8 - 23 mg/dL   Creatinine, Ser 3.08 (H) 0.61 - 1.24 mg/dL   Glucose, Bld 657  (H) 70 - 99 mg/dL   Calcium, Ion 8.46 (L) 1.15 - 1.40 mmol/L   TCO2 15 (L) 22 - 32 mmol/L   Hemoglobin 15.3 13.0 - 17.0 g/dL   HCT 96.2 95.2 - 84.1 %  CBG monitoring, ED  Result Value Ref Range   Glucose-Capillary 246 (H) 70 - 99 mg/dL  I-stat chem 8, ED (not at [facility], [facility] or [facility])  Result Value Ref Range   Sodium 135 135 - 145 mmol/L   Potassium 4.7 3.5 - 5.1 mmol/L   Chloride 104 98 - 111 mmol/L   BUN 89 (H) 8 - 23 mg/dL   Creatinine, Ser 3.24 (H) 0.61 - 1.24 mg/dL   Glucose, Bld 401 (H) 70 - 99 mg/dL   Calcium, Ion 0.27 (L) 1.15 - 1.40 mmol/L   TCO2 20 (L) 22 - 32 mmol/L   Hemoglobin 13.6 13.0 - 17.0 g/dL   HCT 25.3 66.4 - 40.3 %   DG Knee Complete 4 Views Right  Result Date: 01/05/2023 CLINICAL DATA:  Right knee pain and swelling. EXAM: RIGHT KNEE - COMPLETE 4+ VIEW COMPARISON:  None Available. FINDINGS: Patellofemoral and medial tibiofemoral peripheral spurring. No fracture or erosive change. Moderate knee joint effusion. No  focal bone lesion. Mild soft tissue edema. IMPRESSION: Mild osteoarthritis with moderate knee joint effusion. Electronically Signed   By: Narda Rutherford M.D.   On: 01/05/2023 21:54    Radiology No results found.  Procedures Procedures    Medications Ordered in ED Medications  diclofenac Sodium (VOLTAREN) 1 % topical gel 4 g (has no administration in time range)  lidocaine (LIDODERM) 5 % 1 patch (1 patch Transdermal Patch Applied 01/20/23 0421)  fentaNYL (SUBLIMAZE) injection 25 mcg (has no administration in time range)  methocarbamol (ROBAXIN) tablet 1,000 mg (1,000 mg Oral Given 01/20/23 0420)  sodium chloride 0.9 % bolus 1,000 mL (1,000 mLs Intravenous New Bag/Given 01/20/23 0428)    ED Course/ Medical Decision Making/ A&P                                 Medical Decision Making Patient with ongoing knee issues and hyperglycemia, multiple taps by orthopedics   Amount and/or Complexity of Data Reviewed Independent Historian: spouse    Details:  See above  External Data Reviewed: notes.    Details: Previous notes reviewed  Labs: ordered.    Details: Sodium slight low 133 (before hydration, normal potassium, Initial creatinine 2.8 with elevated BUN, anion gap was 20.  Sodium improved to 134, normal potassium worsening BUN. Hydrated in the ED 89, and creatinine increased to 3.3.  GLucose improved. Urine without UTI Discussion of management or test interpretation with external provider(s): Case d/w Dr. Roda Shutters, please add to treatment team and Sanjuana Mae will round on the patient   Risk Prescription drug management. Parenteral controlled substances. Decision regarding hospitalization. Risk Details: Knee immobilizer and crutches     Final Clinical Impression(s) / ED Diagnoses Final diagnoses:  Knee effusion, right  Hyperglycemia    The patient appears reasonably stabilized for admission considering the current resources, flow, and capabilities available in the ED at this time, and I doubt any other [facility] requiring further screening and/or treatment in the ED prior to admission.     Rickey Sadowski, MD 01/20/23 [phone]

## 2023-01-20 NOTE — Progress Notes (Signed)
Pharmacy Antibiotic Note  Johnny [name] is a 67 y.o. male admitted on 01/19/2023 with joint infection.  Pharmacy has been consulted for vancomycin and cefepime dosing. Patient has AKI (sCr 333, up from 2.3 2 weeks ago). WBC 13.0. Mild osteoarthritis with moderate knee joint effusion.   Plan: Cefepime 2G IV q12 hours Vancomycin 2500mg  IV x1 redose by levels Monitor renal function       Temp (24hrs), Avg:99.2 F (37.3 C), Min:98.9 F (37.2 C), Max:99.6 F (37.6 C)  Recent Labs  Lab 01/19/23 1708 01/19/23 1725 01/20/23 0546  WBC 13.0*  --   --   CREATININE 2.80* 2.80* 3.30*    Estimated Creatinine Clearance: 34.5 mL/min (A) (by C-G formula based on SCr of 3.3 mg/dL (H)).    No Known Allergies  Antimicrobials this admission: Vancomycin 9/17>> Cefepime 9/17>>   Thank you for allowing pharmacy to be a part of this patient's care.  Johnny [name] 01/20/2023 12:36 PM

## 2023-01-20 NOTE — Assessment & Plan Note (Addendum)
67 year old presenting for worsening knee pain and weakness found to have acute on chronic kidney disease in setting of recent initiation of jardiance.  -obs to tele  Baseline creatinine appears to be around 2.0 He was recently started on jardiance and could be contributing Continue gentle IVF  UA with 0-5 RBC, 5 ketones  Check urine studies Strict I/O Hold nephrotoxic drugs/jardiance Repeat BMP pending, if trending upward will check renal US

## 2023-01-20 NOTE — Assessment & Plan Note (Signed)
A1C of 9.8 in July of 2024 Sugar mildly elevated today to 250 Hold his glipizide and mounjaro Hold jardiance in setting of AKI Start SSI and low dose long acting insulin

## 2023-01-21 ENCOUNTER — Ambulatory Visit: Payer: BC Managed Care – PPO | Admitting: Podiatry

## 2023-01-21 ENCOUNTER — Inpatient Hospital Stay (HOSPITAL_COMMUNITY): Payer: BC Managed Care – PPO

## 2023-01-21 DIAGNOSIS — D631 Anemia in chronic kidney disease: Secondary | ICD-10-CM | POA: Diagnosis present

## 2023-01-21 DIAGNOSIS — N179 Acute kidney failure, unspecified: Secondary | ICD-10-CM | POA: Diagnosis present

## 2023-01-21 DIAGNOSIS — N138 Other obstructive and reflux uropathy: Secondary | ICD-10-CM | POA: Diagnosis present

## 2023-01-21 DIAGNOSIS — D75839 Thrombocytosis, unspecified: Secondary | ICD-10-CM | POA: Diagnosis present

## 2023-01-21 DIAGNOSIS — M25461 Effusion, right knee: Secondary | ICD-10-CM | POA: Diagnosis present

## 2023-01-21 DIAGNOSIS — E669 Obesity, unspecified: Secondary | ICD-10-CM | POA: Diagnosis present

## 2023-01-21 DIAGNOSIS — I4891 Unspecified atrial fibrillation: Secondary | ICD-10-CM | POA: Diagnosis present

## 2023-01-21 DIAGNOSIS — I4892 Unspecified atrial flutter: Secondary | ICD-10-CM | POA: Diagnosis present

## 2023-01-21 DIAGNOSIS — E1169 Type 2 diabetes mellitus with other specified complication: Secondary | ICD-10-CM | POA: Diagnosis present

## 2023-01-21 DIAGNOSIS — Z7901 Long term (current) use of anticoagulants: Secondary | ICD-10-CM | POA: Diagnosis not present

## 2023-01-21 DIAGNOSIS — I5022 Chronic systolic (congestive) heart failure: Secondary | ICD-10-CM | POA: Diagnosis present

## 2023-01-21 DIAGNOSIS — L409 Psoriasis, unspecified: Secondary | ICD-10-CM | POA: Diagnosis present

## 2023-01-21 DIAGNOSIS — E1165 Type 2 diabetes mellitus with hyperglycemia: Secondary | ICD-10-CM | POA: Diagnosis present

## 2023-01-21 DIAGNOSIS — E1122 Type 2 diabetes mellitus with diabetic chronic kidney disease: Secondary | ICD-10-CM | POA: Diagnosis present

## 2023-01-21 DIAGNOSIS — M25561 Pain in right knee: Secondary | ICD-10-CM | POA: Diagnosis present

## 2023-01-21 DIAGNOSIS — N1832 Chronic kidney disease, stage 3b: Secondary | ICD-10-CM | POA: Diagnosis present

## 2023-01-21 DIAGNOSIS — E8721 Acute metabolic acidosis: Secondary | ICD-10-CM | POA: Diagnosis present

## 2023-01-21 DIAGNOSIS — I13 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 1 through stage 4 chronic kidney disease, or unspecified chronic kidney disease: Secondary | ICD-10-CM | POA: Diagnosis present

## 2023-01-21 DIAGNOSIS — M1711 Unilateral primary osteoarthritis, right knee: Secondary | ICD-10-CM | POA: Diagnosis present

## 2023-01-21 DIAGNOSIS — G4733 Obstructive sleep apnea (adult) (pediatric): Secondary | ICD-10-CM | POA: Diagnosis present

## 2023-01-21 DIAGNOSIS — D72829 Elevated white blood cell count, unspecified: Secondary | ICD-10-CM | POA: Diagnosis present

## 2023-01-21 DIAGNOSIS — E785 Hyperlipidemia, unspecified: Secondary | ICD-10-CM | POA: Diagnosis present

## 2023-01-21 DIAGNOSIS — N401 Enlarged prostate with lower urinary tract symptoms: Secondary | ICD-10-CM | POA: Diagnosis present

## 2023-01-21 DIAGNOSIS — I483 Typical atrial flutter: Secondary | ICD-10-CM | POA: Diagnosis present

## 2023-01-21 DIAGNOSIS — R739 Hyperglycemia, unspecified: Secondary | ICD-10-CM | POA: Diagnosis present

## 2023-01-21 LAB — BASIC METABOLIC PANEL WITH GFR
Anion gap: 12 (ref 5–15)
BUN: 79 mg/dL — ABNORMAL HIGH (ref 8–23)
CO2: 20 mmol/L — ABNORMAL LOW (ref 22–32)
Calcium: 8.5 mg/dL — ABNORMAL LOW (ref 8.9–10.3)
Chloride: 103 mmol/L (ref 98–111)
Creatinine, Ser: 2.76 mg/dL — ABNORMAL HIGH (ref 0.61–1.24)
GFR, Estimated: 25 mL/min — ABNORMAL LOW
Glucose, Bld: 280 mg/dL — ABNORMAL HIGH (ref 70–99)
Potassium: 4.1 mmol/L (ref 3.5–5.1)
Sodium: 135 mmol/L (ref 135–145)

## 2023-01-21 LAB — CBC
HCT: 37.3 % — ABNORMAL LOW (ref 39.0–52.0)
Hemoglobin: 12.2 g/dL — ABNORMAL LOW (ref 13.0–17.0)
MCH: 28.1 pg (ref 26.0–34.0)
MCHC: 32.7 g/dL (ref 30.0–36.0)
MCV: 85.9 fL (ref 80.0–100.0)
Platelets: 543 10*3/uL — ABNORMAL HIGH (ref 150–400)
RBC: 4.34 MIL/uL (ref 4.22–5.81)
RDW: 15 % (ref 11.5–15.5)
WBC: 13.4 10*3/uL — ABNORMAL HIGH (ref 4.0–10.5)
nRBC: 0 % (ref 0.0–0.2)

## 2023-01-21 LAB — GLUCOSE, CAPILLARY
Glucose-Capillary: 235 mg/dL — ABNORMAL HIGH (ref 70–99)
Glucose-Capillary: 295 mg/dL — ABNORMAL HIGH (ref 70–99)
Glucose-Capillary: 237 mg/dL — ABNORMAL HIGH (ref 70–99)
Glucose-Capillary: 266 mg/dL — ABNORMAL HIGH (ref 70–99)

## 2023-01-21 MED ORDER — INSULIN GLARGINE-YFGN 100 UNIT/ML ~~LOC~~ SOLN
10.0000 [IU] | Freq: Every day | SUBCUTANEOUS | Status: DC
  Administered 2023-01-22: 10 [IU] via SUBCUTANEOUS
  Filled 2023-01-21: qty 0.1

## 2023-01-21 MED ORDER — DICLOFENAC SODIUM 1 % EX GEL
2.0000 g | Freq: Four times a day (QID) | CUTANEOUS | Status: DC
  Administered 2023-01-21 – 2023-01-22 (×3): 2 g via TOPICAL
  Filled 2023-01-21: qty 100

## 2023-01-21 MED ORDER — INSULIN ASPART 100 UNIT/ML IJ SOLN
2.0000 [IU] | Freq: Three times a day (TID) | INTRAMUSCULAR | Status: DC
  Administered 2023-01-21 – 2023-01-22 (×3): 2 [IU] via SUBCUTANEOUS

## 2023-01-21 NOTE — Progress Notes (Signed)
PROGRESS NOTE    Johnny [name]  MVH:[id number] DOB: 05/14/1955 DOA: 01/19/2023 PCP: Myrlene Broker, MD   Brief Narrative:  67 y.o. male with medical history significant of  of T2DM, psoriasis, HTN and OSA (Dx 08/2019), Atrial fibrillation, HTN, CKD stage 3 presented with worsening right-sided knee pain (has had recent right knee aspiration x 2 by orthopedics as an outpatient on 01/09/2023 and 01/12/2023).  On presentation, WBC was 13, creatinine of 2.8, right knee x-ray showed mild osteoarthritis with moderate knee joint effusion.  Patient was empirically started on IV antibiotics.  Orthopedics was consulted.  Assessment & Plan:   Severe right knee pain Effusion of right knee -Had outpatient right knee aspiration x 2 by orthopedics on 01/09/2023 and 01/12/2023: Negative fluid studies on 01/12/2023. right knee x-ray showed mild osteoarthritis with moderate knee joint effusion.  -Patient was empirically started on IV antibiotics.   -Patient had right knee aspiration and steroid injection by orthopedics on 01/20/2023.  Orthopedics not concerned about septic knee.  Will DC antibiotics if okay with orthopedics.  AKI on CKD stage IIIb Acute metabolic acidosis -Baseline creatinine of 2-2.3 -Presented with creatinine of 2.8; worsened to 3.3 and improving to 2.76 today. -Bicarb 20 today.  Monitor. -Currently on gentle hydration.  Decrease IV fluids to 50 cc an hour.  Will check renal ultrasound.  Might need outpatient nephrology evaluation and follow-up -Olmesartan hydrochlorothiazide on hold  Sepsis has been ruled out -Doubt that there is any evidence of septic knee as per orthopedics recommendations.  Will DC antibiotics if okay with orthopedics  Leukocytosis -Mild.  Monitor  Thrombocytosis -Possibly reactive.  Monitor intermittently  Anemia of chronic disease -From renal failure.  Hemoglobin stable.  Monitor intermittently.  Chronic systolic heart failure Essential  hypertension -Currently compensated.  Strict input and output.  Daily weights.  Fluid restriction.  Continue Imdur and hydralazine along with Coreg.  Olmesartan hydrochlorothiazide on hold outpatient follow-up with cardiology.  BPH with obstruction/lower urinary tract symptoms -Continue Proscar.  Outpatient follow-up with urology  Diabetes mellitus type 2 with hyperglycemia -Increase long-acting insulin.  Continue CBGs with SSI.  Carb modified diet.  A1c 9.8 on 12/01/2022  Typical atrial flutter -Currently rate controlled.  Continue Coreg and Eliquis.  Outpatient follow-up with cardiology  OSA -Continue CPAP at night  Obesity -Outpatient follow-up  DVT prophylaxis: Eliquis Code Status: Full Family Communication: None at bedside Disposition Plan: Status is: Inpatient Remains inpatient appropriate because: Of severity of illness  Consultants: None  Procedures: None  Antimicrobials:  Anti-infectives (From admission, onward)    Start     Dose/Rate Route Frequency Ordered Stop   01/20/23 1250  vancomycin variable dose per unstable renal function (pharmacist dosing)  Status:  Discontinued         Does not apply See admin instructions 01/20/23 1250 01/21/23 1130   01/20/23 1245  ceFEPIme (MAXIPIME) 2 g in sodium chloride 0.9 % 100 mL IVPB  Status:  Discontinued        2 g 200 mL/hr over 30 Minutes Intravenous Every 12 hours 01/20/23 1236 01/21/23 1130   01/20/23 1245  vancomycin (VANCOCIN) 2,500 mg in sodium chloride 0.9 % 500 mL IVPB        2,500 mg 262.5 mL/hr over 120 Minutes Intravenous  Once 01/20/23 1236 01/20/23 1709       Subjective: Patient seen and examined at bedside.  Feels slightly better after right knee joint injection yesterday.  No fever, vomiting, worsening chest pain or shortness of  breath reported.  Objective: Vitals:   01/20/23 2122 01/21/23 0550 01/21/23 0811 01/21/23 0908  BP:  (!) 139/91 139/82 (!) 144/111  Pulse:  76 81 87  Resp: (!) 25 20 13 18    Temp:  97.6 F (36.4 C)  98.2 F (36.8 C)  TempSrc:  Oral    SpO2:  100%  99%  Weight:   136 kg     Intake/Output Summary (Last 24 hours) at 01/21/2023 1401 Last data filed at 01/21/2023 1122 Gross per 24 hour  Intake 2574.63 ml  Output 1200 ml  Net 1374.63 ml   Filed Weights   01/21/23 0811  Weight: 136 kg    Examination:  General exam: Appears calm and comfortable.  Looks chronically ill and deconditioned.  On room air. Respiratory system: Bilateral decreased breath sounds at bases with some scattered crackles Cardiovascular system: S1 & S2 heard, Rate controlled Gastrointestinal system: Abdomen is obese, nondistended, soft and nontender. Normal bowel sounds heard. Extremities: No cyanosis, clubbing, edema.  Right knee mild tenderness present Central nervous system: Alert and oriented. No focal neurological deficits. Moving extremities Skin: No ecchymosis or lesions.  Has psoriatic skin lesions throughout the body.   Psychiatry: Judgement and insight appear normal. Mood & affect appropriate.     Data Reviewed: I have personally reviewed following labs and imaging studies  CBC: Recent Labs  Lab 01/19/23 1708 01/19/23 1725 01/20/23 0546 01/21/23 0940  WBC 13.0*  --   --  13.4*  HGB 13.4 15.3 13.6 12.2*  HCT 41.8 45.0 40.0 37.3*  MCV 84.4  --   --  85.9  PLT 574*  --   --  543*   Basic Metabolic Panel: Recent Labs  Lab 01/19/23 1708 01/19/23 1725 01/20/23 0546 01/20/23 1823 01/21/23 0940  NA 133* 134* 135 135 135  K 4.6 4.4 4.7 4.2 4.1  CL 99 105 104 99 103  CO2 14*  --   --  19* 20*  GLUCOSE 250* 272* 229* 196* 280*  BUN 59* 52* 89* 76* 79*  CREATININE 2.80* 2.80* 3.30* 2.99* 2.76*  CALCIUM 8.9  --   --  8.7* 8.5*   GFR: Estimated Creatinine Clearance: 41.3 mL/min (A) (by C-G formula based on SCr of 2.76 mg/dL (H)). Liver Function Tests: Recent Labs  Lab 01/19/23 1708  AST 39  ALT 53*  ALKPHOS 76  BILITOT 1.2  PROT 8.2*  ALBUMIN 2.2*   No  results for input(s): "LIPASE", "AMYLASE" in the last 168 hours. No results for input(s): "AMMONIA" in the last 168 hours. Coagulation Profile: No results for input(s): "INR", "PROTIME" in the last 168 hours. Cardiac Enzymes: No results for input(s): "CKTOTAL", "CKMB", "CKMBINDEX", "TROPONINI" in the last 168 hours. BNP (last 3 results) No results for input(s): "PROBNP" in the last 8760 hours. HbA1C: No results for input(s): "HGBA1C" in the last 72 hours. CBG: Recent Labs  Lab 01/20/23 1659 01/20/23 2057 01/20/23 2110 01/21/23 0719 01/21/23 1122  GLUCAP 201* 232* 90 235* 295*   Lipid Profile: No results for input(s): "CHOL", "HDL", "LDLCALC", "TRIG", "CHOLHDL", "LDLDIRECT" in the last 72 hours. Thyroid Function Tests: No results for input(s): "TSH", "T4TOTAL", "FREET4", "T3FREE", "THYROIDAB" in the last 72 hours. Anemia Panel: No results for input(s): "VITAMINB12", "FOLATE", "FERRITIN", "TIBC", "IRON", "RETICCTPCT" in the last 72 hours. Sepsis Labs: Recent Labs  Lab 01/20/23 1823  LATICACIDVEN 1.3    Recent Results (from the past 240 hour(s))  Anaerobic and Aerobic Culture     Status: None  Collection Time: 01/12/23  3:41 PM   Specimen: Body Fluid  Result Value Ref Range Status   MICRO NUMBER: [id number]  Final   SPECIMEN QUALITY: Adequate  Final   Source: FLUID, SYNOVIAL  Final   STATUS: FINAL  Final   GRAM STAIN:   Final    No organisms or white blood cells seen No epithelial cells seen   ANA RESULT: No anaerobes isolated.  Final   MICRO NUMBER: [id number]  Final   SPECIMEN QUALITY: Adequate  Final   SOURCE: FLUID, SYNOVIAL  Final   STATUS: FINAL  Final   AER RESULT: No Growth  Final  Culture, blood (Routine X 2) w Reflex to ID Panel     Status: None (Preliminary result)   Collection Time: 01/20/23 10:22 AM   Specimen: BLOOD RIGHT HAND  Result Value Ref Range Status   Specimen Description BLOOD RIGHT HAND  Final   Special Requests   Final    BOTTLES DRAWN  AEROBIC AND ANAEROBIC Blood Culture adequate volume   Culture   Final    NO GROWTH < 24 HOURS Performed at [facility], [address] [place], [place] [address]    Report Status PENDING  Incomplete  Culture, blood (Routine X 2) w Reflex to ID Panel     Status: None (Preliminary result)   Collection Time: 01/20/23 10:37 AM   Specimen: BLOOD RIGHT ARM  Result Value Ref Range Status   Specimen Description BLOOD RIGHT ARM  Final   Special Requests   Final    BOTTLES DRAWN AEROBIC AND ANAEROBIC Blood Culture adequate volume   Culture   Final    NO GROWTH < 24 HOURS Performed at [facility], [address], [place], [place] [address]    Report Status PENDING  Incomplete  Body fluid culture w Gram Stain     Status: None (Preliminary result)   Collection Time: 01/20/23 11:25 AM   Specimen: Synovium; Body Fluid  Result Value Ref Range Status   Specimen Description SYNOVIAL  Final   Special Requests RT KNEE  Final   Gram Stain   Final    FEW WBC PRESENT, PREDOMINANTLY PMN NO ORGANISMS SEEN Performed at [facility], [address], [place], [place] [address]    Culture PENDING  Incomplete   Report Status PENDING  Incomplete         Radiology Studies: No results found.      Scheduled Meds:  apixaban  5 mg Oral BID   carvedilol  25 mg Oral BID WC   feeding supplement  237 mL Oral BID BM   finasteride  5 mg Oral Daily   hydrALAZINE  50 mg Oral TID   insulin aspart  0-9 Units Subcutaneous TID WC   insulin glargine-yfgn  5 Units Subcutaneous Daily   isosorbide mononitrate  60 mg Oral Daily   lidocaine  1 patch Transdermal Q24H   Continuous Infusions:  sodium chloride 75 mL/hr at 01/21/23 0550          Glade Lloyd, MD [facility] 01/21/2023, 2:01 PM

## 2023-01-21 NOTE — Evaluation (Signed)
Physical Therapy Evaluation Patient Details Name: Johnny [name] MRN: [id number] DOB: 1955/05/11 Today's Date: 01/21/2023  History of Present Illness  Pt is a 67 y/o M admitted on 01/19/23 after presenting to the ED with c/o worsening R knee pain. Pt is s/p R knee aspiration & injection on 01/20/23. PMH: DM2, psoriasis, HTN, OSA, a-fib, HTN, CKD3  Clinical Impression  Pt seen for PT evaluation with pt agreeable to tx, family (wife & son) present in room. Pt reports prior to R knee pain he was ambulatory, working & driving but has progressed to using a cane & now RW. On this date, pt has son assist him with bed mobility but pt is able to transfer STS from elevated EOB with RW (after PT wrapped R knee in ace wrap per pt request & 2/2 knee immobilizer already taken home). Pt is able to take side steps along EOB & ambulate to bathroom with RW & CGA with decreased weight bearing through RLE. Pt with palpable "grinding" in R knee joint with minimal movement. Will continue to follow pt acutely to address transfers, gait & stair negotiation.        If plan is discharge home, recommend the following: A little help with walking and/or transfers;A little help with bathing/dressing/bathroom;Assistance with cooking/housework;Assist for transportation;Help with stairs or ramp for entrance   Can travel by private vehicle        Equipment Recommendations Rolling walker (2 wheels);BSC/3in1 (tall RW, pt is 6'5")  Recommendations for Other Services       Functional Status Assessment Patient has had a recent decline in their functional status and demonstrates the ability to make significant improvements in function in a reasonable and predictable amount of time.     Precautions / Restrictions Precautions Precautions: Fall Precaution Comments: R knee immobilizer for comfort Restrictions Weight Bearing Restrictions: No      Mobility  Bed Mobility Overal bed mobility: Needs Assistance Bed Mobility:  Supine to Sit     Supine to sit: Min assist, HOB elevated, Used rails     General bed mobility comments: Pt requests son help him - son assists him with uprighting trunk, pt supports RLE with LLE.    Transfers Overall transfer level: Needs assistance Equipment used: Rolling walker (2 wheels) Transfers: Sit to/from Stand Sit to Stand: Min assist, From elevated surface           General transfer comment: STS from significantly elevated EOB with RW & min assist, cuing re: hand placement foot placement, pt requires extra time to power up to standing & transition hands from pushing on EOB to RW.    Ambulation/Gait Ambulation/Gait assistance: Contact guard assist Gait Distance (Feet):  (4 ft side stepping to the R, 7 ft to bathroom.) Assistive device: Rolling walker (2 wheels) Gait Pattern/deviations: Decreased step length - right, Decreased step length - left, Decreased stride length, Decreased dorsiflexion - right, Decreased weight shift to right, Decreased stance time - right Gait velocity: decreased     General Gait Details: Pt with decreased weight bearing through RLE, uses BUE on RW to offload RLE. When stepping backwards at EOB x ~2 steps pt demonstrates anterior instability in L knee but no buckling/LOB.  Stairs            Wheelchair Mobility     Tilt Bed    Modified Rankin (Stroke Patients Only)       Balance Overall balance assessment: Needs assistance   Sitting balance-Leahy Scale: Good  Standing balance support: During functional activity, Bilateral upper extremity supported, Reliant on assistive device for balance Standing balance-Leahy Scale: Fair                               Pertinent Vitals/Pain Pain Assessment Pain Assessment: No/denies pain    Home Living Family/patient expects to be discharged to:: Private residence Living Arrangements: Spouse/significant other;Children Available Help at Discharge: Family;Available 24  hours/day Type of Home: House Home Access: Stairs to enter Entrance Stairs-Rails: Right;Left (wide set at front door, can reach B rails at garage entrance) Entrance Stairs-Number of Steps: 4 at front door, 10 at garage   Home Layout: One level Home Equipment: Agricultural consultant (2 wheels)      Prior Function               Mobility Comments: Pt reports prior to pain he was ambulatory without AD, driving, working but as pain has worsened he started using a cane then progressed to using a RW, but now having difficulty using that. Denies falls in the past 6 months.       Extremity/Trunk Assessment   Upper Extremity Assessment Upper Extremity Assessment: Overall WFL for tasks assessed    Lower Extremity Assessment Lower Extremity Assessment: RLE deficits/detail RLE Deficits / Details: Pt c/o feeling "grinding" in knee joint, PT able to palpate this when pt attempts supine>sit. RLE: Unable to fully assess due to pain    Cervical / Trunk Assessment Cervical / Trunk Assessment: Normal  Communication   Communication Communication: No apparent difficulties  Cognition Arousal: Lethargic, Alert Behavior During Therapy: Anxious, WFL for tasks assessed/performed Overall Cognitive Status: Within Functional Limits for tasks assessed                                 General Comments: Pt anxious pain will return.        General Comments General comments (skin integrity, edema, etc.): PT placed ace wrap on pt's R knee per pt request (pt reports family member took his knee immobilizer home already but PT educated pt on purpose/it's use).    Exercises     Assessment/Plan    PT Assessment Patient needs continued PT services  PT Problem List Pain;Decreased range of motion;Decreased activity tolerance;Decreased balance;Decreased mobility;Decreased knowledge of use of DME       PT Treatment Interventions Balance training;Gait training;Neuromuscular re-education;Stair  training;DME instruction;Functional mobility training;Therapeutic activities;Therapeutic exercise;Manual techniques    PT Goals (Current goals can be found in the Care Plan section)  Acute Rehab PT Goals Patient Stated Goal: R knee to get better PT Goal Formulation: With patient Time For Goal Achievement: 02/04/23 Potential to Achieve Goals: Good    Frequency Min 1X/week     Co-evaluation               AM-PAC PT "6 Clicks" Mobility  Outcome Measure Help needed turning from your back to your side while in a flat bed without using bedrails?: A Little Help needed moving from lying on your back to sitting on the side of a flat bed without using bedrails?: A Lot Help needed moving to and from a bed to a chair (including a wheelchair)?: A Lot Help needed standing up from a chair using your arms (e.g., wheelchair or bedside chair)?: A Lot Help needed to walk in hospital room?: A Lot Help needed climbing 3-5 steps  with a railing? : Total 6 Click Score: 12    End of Session   Activity Tolerance: Patient tolerated treatment well Patient left:  (on toilet with call bell in reach, nurse aware of pt's location) Nurse Communication: Mobility status PT Visit Diagnosis: Other abnormalities of gait and mobility (R26.89);Difficulty in walking, not elsewhere classified (R26.2);Pain Pain - Right/Left: Right Pain - part of body: Knee    Time: 1610-9604 PT Time Calculation (min) (ACUTE ONLY): 28 min   Charges:   PT Evaluation $PT Eval Low Complexity: 1 Low   PT General Charges $$ ACUTE PT VISIT: 1 Visit         Aleda Grana, PT, DPT 01/21/23, 2:21 PM   Sandi Mariscal 01/21/2023, 2:19 PM

## 2023-01-21 NOTE — Progress Notes (Signed)
Patient ID: Johnny [name], male   DOB: 1955/06/23, 67 y.o.   MRN: [id number]  Cell count reviewed, no e/o septic arthritis. No need for antibiotics.    Freeman Caldron, PA-C Orthopedic Surgery [phone]

## 2023-01-21 NOTE — TOC CM/SW Note (Addendum)
Transition of Care [facility]) - Inpatient Brief Assessment   Patient Details  Name: TREYVAUGHN DERUYTER MRN: [id number] Date of Birth: Feb 23, 1956  Transition of Care [facility]) CM/SW Contact:    Tom-Johnson, Hershal Coria, RN Phone Number: 01/21/2023, 2:07 PM   Clinical Narrative:  Patient presented to the ED with Worsening Rt Knee Pain. Patient following by Ortho outpatient. Admitted with ARF on CKD 3b.  Underwent Rt Knee Effusion yesterday 01/20/23 by Gaylord Shih. No Abx ordered.   From home with wife, has one son. Employed, independent with care and drive self prior to admission.  Has a acne and walker at home.  PCP is  Myrlene Broker, MD and uses [facility] on [address]   PT recommends Outpatient PT.  Patient requested to go to [facility] on [address] [facility] [address]. Referral and order on AVS. RW and BSC ordered from [employer] and Earna Coder to deliver to patient at bedside.    CM will continue to follow as patient progresses with care towards discharge.             Transition of Care Asessment: Insurance and Status: Insurance coverage has been reviewed Patient has primary care physician: Yes Home environment has been reviewed: Yes Prior level of function:: Independent Prior/Current Home Services: No current home services Social Determinants of Health Reivew: SDOH reviewed no interventions necessary Readmission risk has been reviewed: Yes Transition of care needs: transition of care needs identified, TOC will continue to follow

## 2023-01-21 NOTE — Progress Notes (Cosign Needed)
    Durable Medical Equipment  (From admission, onward)           Start     Ordered   01/21/23 1437  For home use only DME 3 n 1  Once       Comments: Patient with Rt Knee pain s/p Rt Knee Effusion. Patient confined to one room with Decreased Activity Tolerance which necessitate recommendation for bedside commode as he is not able to ambulate to the bathroom.   01/21/23 1438   01/21/23 1420  For home use only DME Walker rolling  Once       Question Answer Comment  Walker: With 5 Inch Wheels   Patient needs a walker to treat with the following condition Weakness      01/21/23 1419

## 2023-01-21 NOTE — Inpatient Diabetes Management (Signed)
Inpatient Diabetes Program Recommendations  AACE/ADA: New Consensus Statement on Inpatient Glycemic Control (2015)  Target Ranges:  Prepandial:   less than 140 mg/dL      Peak postprandial:   less than 180 mg/dL (1-2 hours)      Critically ill patients:  140 - 180 mg/dL   Lab Results  Component Value Date   GLUCAP 295 (H) 01/21/2023   HGBA1C 9.8 (A) 12/01/2022    Review of Glycemic Control  Latest Reference Range & Units 01/20/23 11:50 01/20/23 16:59 01/20/23 20:57 01/20/23 21:10 01/21/23 07:19 01/21/23 11:22  Glucose-Capillary 70 - 99 mg/dL 010 (H) 272 (H) 536 (H) 90 235 (H) 295 (H)   Diabetes history: DM  Outpatient Diabetes medications:  Jardiance 25 mg Daily, Glipizide 5 mg bid, Mounjaro 5 mg weekly Current orders for Inpatient glycemic control:  Semglee 5 units Daily Novolog 0-9 units tid  Inpatient Diabetes Program Recommendations:    Consider increasing Semglee to 10 units daily and possibly add Novolog meal coverage 2 units tid with meals.   Thanks,  Beryl Meager, RN, BC-ADM Inpatient Diabetes Coordinator Pager [phone]  (8a-5p)

## 2023-01-22 DIAGNOSIS — N1832 Chronic kidney disease, stage 3b: Secondary | ICD-10-CM | POA: Diagnosis not present

## 2023-01-22 DIAGNOSIS — N179 Acute kidney failure, unspecified: Secondary | ICD-10-CM | POA: Diagnosis not present

## 2023-01-22 LAB — CBC WITH DIFFERENTIAL/PLATELET
Abs Immature Granulocytes: 0.08 10*3/uL — ABNORMAL HIGH (ref 0.00–0.07)
Basophils Absolute: 0 10*3/uL (ref 0.0–0.1)
Basophils Relative: 0 %
Eosinophils Absolute: 0 10*3/uL (ref 0.0–0.5)
Eosinophils Relative: 0 %
HCT: 35.6 % — ABNORMAL LOW (ref 39.0–52.0)
Hemoglobin: 11.7 g/dL — ABNORMAL LOW (ref 13.0–17.0)
Immature Granulocytes: 1 %
Lymphocytes Relative: 5 %
Lymphs Abs: 0.6 10*3/uL — ABNORMAL LOW (ref 0.7–4.0)
MCH: 28.6 pg (ref 26.0–34.0)
MCHC: 32.9 g/dL (ref 30.0–36.0)
MCV: 87 fL (ref 80.0–100.0)
Monocytes Absolute: 0.7 10*3/uL (ref 0.1–1.0)
Monocytes Relative: 6 %
Neutro Abs: 10.6 10*3/uL — ABNORMAL HIGH (ref 1.7–7.7)
Neutrophils Relative %: 88 %
Platelets: 509 10*3/uL — ABNORMAL HIGH (ref 150–400)
RBC: 4.09 MIL/uL — ABNORMAL LOW (ref 4.22–5.81)
RDW: 15 % (ref 11.5–15.5)
WBC: 12 10*3/uL — ABNORMAL HIGH (ref 4.0–10.5)
nRBC: 0 % (ref 0.0–0.2)

## 2023-01-22 LAB — GLUCOSE, CAPILLARY
Glucose-Capillary: 216 mg/dL — ABNORMAL HIGH (ref 70–99)
Glucose-Capillary: 264 mg/dL — ABNORMAL HIGH (ref 70–99)

## 2023-01-22 LAB — BASIC METABOLIC PANEL
Anion gap: 9 (ref 5–15)
BUN: 76 mg/dL — ABNORMAL HIGH (ref 8–23)
CO2: 21 mmol/L — ABNORMAL LOW (ref 22–32)
Calcium: 8.5 mg/dL — ABNORMAL LOW (ref 8.9–10.3)
Chloride: 107 mmol/L (ref 98–111)
Creatinine, Ser: 2.46 mg/dL — ABNORMAL HIGH (ref 0.61–1.24)
GFR, Estimated: 29 mL/min — ABNORMAL LOW (ref >60)
Glucose, Bld: 246 mg/dL — ABNORMAL HIGH (ref 70–99)
Potassium: 4.4 mmol/L (ref 3.5–5.1)
Sodium: 137 mmol/L (ref 135–145)

## 2023-01-22 LAB — MAGNESIUM: Magnesium: 2.8 mg/dL — ABNORMAL HIGH (ref 1.7–2.4)

## 2023-01-22 MED ORDER — HYDRALAZINE HCL 50 MG PO TABS: 50.0000 mg | ORAL_TABLET | Freq: Three times a day (TID) | ORAL | Status: DC

## 2023-01-22 MED ORDER — DICLOFENAC SODIUM 1 % EX GEL: 2.0000 g | Freq: Four times a day (QID) | CUTANEOUS | 1 refills | Status: DC

## 2023-01-22 NOTE — Discharge Summary (Signed)
Physician Discharge Summary  Johnny [name] WGN:[id number] DOB: 1955/07/06 DOA: 01/19/2023  PCP: Myrlene Broker, MD  Admit date: 01/19/2023 Discharge date: 01/22/2023  Admitted From: Home Disposition: Home  Recommendations for Outpatient Follow-up:  Follow up with PCP in 1 week with repeat CBC/BMP Outpatient follow-up with orthopedics Recommend outpatient follow-up with nephrology Follow up in ED if symptoms worsen or new appear   Home Health: No Equipment/Devices: None  Discharge Condition: Stable CODE STATUS: Full Diet recommendation: Heart healthy/  Brief/Interim Summary: 67 y.o. male with medical history significant of  of T2DM, psoriasis, HTN and OSA (Dx 08/2019), Atrial fibrillation, HTN, CKD stage 3 presented with worsening right-sided knee pain (has had recent right knee aspiration x 2 by orthopedics as an outpatient on 01/09/2023 and 01/12/2023).  On presentation, WBC was 13, creatinine of 2.8, right knee x-ray showed mild osteoarthritis with moderate knee joint effusion.  Patient was empirically started on IV antibiotics.  Orthopedics was consulted.  Patient underwent right knee aspiration and steroid injection by orthopedics on 01/20/2023.  Subsequently, IV antibiotics were discontinued after discussion with orthopedics.  Knee pain has improved.  Renal function also improving.  Patient feels okay to go home today.  He will be discharged home today with outpatient follow-up with PCP and orthopedics.  Discharge Diagnoses:   Severe right knee pain Effusion of right knee -Had outpatient right knee aspiration x 2 by orthopedics on 01/09/2023 and 01/12/2023: Negative fluid studies on 01/12/2023. right knee x-ray showed mild osteoarthritis with moderate knee joint effusion.  -Patient was empirically started on IV antibiotics.   -Patient had right knee aspiration and steroid injection by orthopedics on 01/20/2023.  Orthopedics not concerned about septic knee.  DC'd antibiotics on  01/21/2023. -Pain is improving.  Discharge patient home today with outpatient follow-up with PCP and orthopedics.  AKI on CKD stage IIIb Acute metabolic acidosis -Baseline creatinine of 2-2.3 -Presented with creatinine of 2.8; worsened to 3.3 and improving to 2.46 today. -Bicarb 21 today.  -Treated with gentle hydration.  Renal ultrasound negative for hydronephrosis.  Might need outpatient nephrology evaluation and follow-up -Olmesartan hydrochlorothiazide on hold.  Will keep Mounjaro and Jardiance on hold as well. -Outpatient follow-up of BMP within a week.   Sepsis has been ruled out -Doubt that there is any evidence of septic knee as per orthopedics recommendations.  Antibiotics have already been discontinued.  Leukocytosis -Mild.  No labs today.  Thrombocytosis -Possibly reactive.  No labs today.  Anemia of chronic disease -From renal failure.  Hemoglobin stable.  Monitor intermittently as an outpatient.   Chronic systolic heart failure Essential hypertension -Currently compensated.  Continue diet and fluid restriction.   Continue Imdur and hydralazine along with Coreg.  Olmesartan hydrochlorothiazide on hold.  Outpatient follow-up with PCP/cardiology.   BPH with obstruction/lower urinary tract symptoms -Continue Proscar.  Outpatient follow-up with urology   Diabetes mellitus type 2 with hyperglycemia -Carb modified diet.  A1c 9.8 on 12/01/2022.  Resume glipizide on discharge.  Hold Mounjaro and Jardiance till reevaluation by PCP.  Might need initiation of insulin as an outpatient  Typical atrial flutter -Currently rate controlled.  Continue Coreg and Eliquis.  Outpatient follow-up with cardiology   OSA -Continue CPAP at night   Obesity -Outpatient follow-up  Discharge Instructions  Discharge Instructions     Ambulatory referral to Physical Therapy   Complete by: As directed    Diet - low sodium heart healthy   Complete by: As directed    Diet Carb Modified  Complete by: As directed    Increase activity slowly   Complete by: As directed       Allergies as of 01/22/2023   No Known Allergies      Medication List     STOP taking these medications    Emergen-C Electro Mix Pack   empagliflozin 25 MG Tabs tablet Commonly known as: Jardiance   olmesartan-hydrochlorothiazide 40-12.5 MG tablet Commonly known as: BENICAR HCT   tirzepatide 5 MG/0.5ML Pen Commonly known as: MOUNJARO       TAKE these medications    carvedilol 25 MG tablet Commonly known as: COREG Take 1 tablet (25 mg total) by mouth 2 (two) times daily with a meal.   diclofenac Sodium 1 % Gel Commonly known as: VOLTAREN Apply 2 g topically 4 (four) times daily.   Eliquis 5 MG Tabs tablet Generic drug: apixaban TAKE 1 TABLET BY MOUTH TWICE A DAY   finasteride 5 MG tablet Commonly known as: PROSCAR TAKE 1 TABLET (5 MG TOTAL) BY MOUTH DAILY.   glipiZIDE 5 MG tablet Commonly known as: GLUCOTROL Take 1 tablet (5 mg total) by mouth 2 (two) times daily before a meal.   hydrALAZINE 50 MG tablet Commonly known as: APRESOLINE Take 1 tablet (50 mg total) by mouth 3 (three) times daily.   HYDROcodone-acetaminophen 5-325 MG tablet Commonly known as: NORCO/VICODIN Take 1 tablet by mouth every 4 (four) hours as needed for moderate pain.   isosorbide mononitrate 60 MG 24 hr tablet Commonly known as: IMDUR TAKE 1 TABLET BY MOUTH EVERY DAY   QC Tumeric Complex 500 MG Caps Generic drug: Turmeric Take 1,000 mg by mouth daily.               Durable Medical Equipment  (From admission, onward)           Start     Ordered   01/21/23 1437  For home use only DME 3 n 1  Once       Comments: Patient with Rt Knee pain s/p Rt Knee Effusion. Patient confined to one room with Decreased Activity Tolerance which necessitate recommendation for bedside commode as he is not able to ambulate to the bathroom.   01/21/23 1438   01/21/23 1420  For home use only DME  Walker rolling  Once       Question Answer Comment  Walker: With 5 Inch Wheels   Patient needs a walker to treat with the following condition Weakness      01/21/23 1419            Follow-up Information     [facility] Follow up.   Specialty: Rehabilitation Why: Call to schedule first visit. Contact information: [address] [place] [address] [phone]        Myrlene Broker, MD. Schedule an appointment as soon as possible for a visit in 1 week(s).   Specialty: Internal Medicine Why: with repeat cbc/bmp Contact information: [address] [place] [address] [phone]         Persons, [place], [place]. Schedule an appointment as soon as possible for a visit in 1 week(s).   Specialty: Orthopedic Surgery Contact information: [address] [place] [address] [phone]                No Known Allergies  Consultations: Orthopedics  Procedures/Studies: US RENAL  Result Date: 01/22/2023 CLINICAL DATA:  Renal failure EXAM: RENAL / URINARY TRACT ULTRASOUND  COMPLETE COMPARISON:  None Available. FINDINGS: Right Kidney: Renal measurements: 12.6 x 6.0 x 4.6 cm. = volume: 183 mL. Echogenicity within normal limits. No mass or hydronephrosis visualized. Left Kidney: Renal measurements: 11.2 x 5.8 x 4.3 cm. = volume: 144 mL. Echogenicity within normal limits. No mass or hydronephrosis visualized. Bladder: Appears normal for degree of bladder distention. Other: None. IMPRESSION: Normal appearing kidneys bilaterally. Electronically Signed   By: Alcide Clever M.D.   On: 01/22/2023 00:48   DG Knee Complete 4 Views Right  Result Date: 01/05/2023 CLINICAL DATA:  Right knee pain and swelling. EXAM: RIGHT KNEE - COMPLETE 4+ VIEW COMPARISON:  None Available. FINDINGS: Patellofemoral and medial tibiofemoral peripheral spurring. No fracture or erosive change. Moderate knee joint  effusion. No focal bone lesion. Mild soft tissue edema. IMPRESSION: Mild osteoarthritis with moderate knee joint effusion. Electronically Signed   By: Narda Rutherford M.D.   On: 01/05/2023 21:54      Subjective: Patient seen and examined at bedside.  Knee pain is improving.  Feels okay to go home today.  No fever, worsening shortness of breath or chest pain reported.  Discharge Exam: Vitals:   01/22/23 0410 01/22/23 0857  BP: (!) 150/93 (!) 157/90  Pulse: 71 73  Resp: 20 18  Temp: 98 F (36.7 C) 98.6 F (37 C)  SpO2: 99% 96%    General: Pt is alert, awake, not in acute distress.  On room air currently. Cardiovascular: rate controlled, S1/S2 + Respiratory: bilateral decreased breath sounds at bases Abdominal: Soft, obese, NT, ND, bowel sounds + Extremities: Trace lower extremity edema; no cyanosis    The results of significant diagnostics from this hospitalization (including imaging, microbiology, ancillary and laboratory) are listed below for reference.     Microbiology: Recent Results (from the past 240 hour(s))  Anaerobic and Aerobic Culture     Status: None   Collection Time: 01/12/23  3:41 PM   Specimen: Body Fluid  Result Value Ref Range Status   MICRO NUMBER: [id number]  Final   SPECIMEN QUALITY: Adequate  Final   Source: FLUID, SYNOVIAL  Final   STATUS: FINAL  Final   GRAM STAIN:   Final    No organisms or white blood cells seen No epithelial cells seen   ANA RESULT: No anaerobes isolated.  Final   MICRO NUMBER: [id number]  Final   SPECIMEN QUALITY: Adequate  Final   SOURCE: FLUID, SYNOVIAL  Final   STATUS: FINAL  Final   AER RESULT: No Growth  Final  Culture, blood (Routine X 2) w Reflex to ID Panel     Status: None (Preliminary result)   Collection Time: 01/20/23 10:22 AM   Specimen: BLOOD RIGHT HAND  Result Value Ref Range Status   Specimen Description BLOOD RIGHT HAND  Final   Special Requests   Final    BOTTLES DRAWN AEROBIC AND ANAEROBIC Blood Culture  adequate volume   Culture   Final    NO GROWTH 2 DAYS Performed at [facility], [address] [place], [place] [address]    Report Status PENDING  Incomplete  Culture, blood (Routine X 2) w Reflex to ID Panel     Status: None (Preliminary result)   Collection Time: 01/20/23 10:37 AM   Specimen: BLOOD RIGHT ARM  Result Value Ref Range Status   Specimen Description BLOOD RIGHT ARM  Final   Special Requests   Final    BOTTLES DRAWN AEROBIC AND ANAEROBIC Blood Culture adequate volume  Culture   Final    NO GROWTH 2 DAYS Performed at [facility], [address], [place], [place] [address]    Report Status PENDING  Incomplete  Body fluid culture w Gram Stain     Status: None (Preliminary result)   Collection Time: 01/20/23 11:25 AM   Specimen: Synovium; Body Fluid  Result Value Ref Range Status   Specimen Description SYNOVIAL  Final   Special Requests RT KNEE  Final   Gram Stain   Final    FEW WBC PRESENT, PREDOMINANTLY PMN NO ORGANISMS SEEN    Culture   Final    FEW GROUP B STREP(S.AGALACTIAE)ISOLATED TESTING AGAINST S. AGALACTIAE NOT ROUTINELY PERFORMED DUE TO PREDICTABILITY OF AMP/PEN/VAN SUSCEPTIBILITY. Performed at [facility], [address] [place], [place] [address]    Report Status PENDING  Incomplete     Labs: BNP (last 3 results) No results for input(s): "BNP" in the last 8760 hours. Basic Metabolic Panel: Recent Labs  Lab 01/19/23 1708 01/19/23 1725 01/20/23 0546 01/20/23 1823 01/21/23 0940 01/22/23 0451  NA 133* 134* 135 135 135 137  K 4.6 4.4 4.7 4.2 4.1 4.4  CL 99 105 104 99 103 107  CO2 14*  --   --  19* 20* 21*  GLUCOSE 250* 272* 229* 196* 280* 246*  BUN 59* 52* 89* 76* 79* 76*  CREATININE 2.80* 2.80* 3.30* 2.99* 2.76* 2.46*  CALCIUM 8.9  --   --  8.7* 8.5* 8.5*  MG  --   --   --   --   --  2.8*   Liver Function Tests: Recent Labs  Lab 01/19/23 1708  AST 39  ALT 53*  ALKPHOS 76  BILITOT 1.2  PROT 8.2*  ALBUMIN 2.2*    No results for input(s): "LIPASE", "AMYLASE" in the last 168 hours. No results for input(s): "AMMONIA" in the last 168 hours. CBC: Recent Labs  Lab 01/19/23 1708 01/19/23 1725 01/20/23 0546 01/21/23 0940  WBC 13.0*  --   --  13.4*  HGB 13.4 15.3 13.6 12.2*  HCT 41.8 45.0 40.0 37.3*  MCV 84.4  --   --  85.9  PLT 574*  --   --  543*   Cardiac Enzymes: No results for input(s): "CKTOTAL", "CKMB", "CKMBINDEX", "TROPONINI" in the last 168 hours. BNP: Invalid input(s): "POCBNP" CBG: Recent Labs  Lab 01/21/23 0719 01/21/23 1122 01/21/23 1623 01/21/23 2121 01/22/23 0721  GLUCAP 235* 295* 237* 266* 216*   D-Dimer No results for input(s): "DDIMER" in the last 72 hours. Hgb A1c No results for input(s): "HGBA1C" in the last 72 hours. Lipid Profile No results for input(s): "CHOL", "HDL", "LDLCALC", "TRIG", "CHOLHDL", "LDLDIRECT" in the last 72 hours. Thyroid function studies No results for input(s): "TSH", "T4TOTAL", "T3FREE", "THYROIDAB" in the last 72 hours.  Invalid input(s): "FREET3" Anemia work up No results for input(s): "VITAMINB12", "FOLATE", "FERRITIN", "TIBC", "IRON", "RETICCTPCT" in the last 72 hours. Urinalysis    Component Value Date/Time   COLORURINE YELLOW 01/19/2023 1702   APPEARANCEUR CLOUDY (A) 01/19/2023 1702   APPEARANCEUR Clear 05/28/2022 1454   LABSPEC 1.023 01/19/2023 1702   PHURINE 5.0 01/19/2023 1702   GLUCOSEU >=500 (A) 01/19/2023 1702   GLUCOSEU >=1000 04/23/2009 0911   HGBUR SMALL (A) 01/19/2023 1702   BILIRUBINUR NEGATIVE 01/19/2023 1702   BILIRUBINUR Negative 05/28/2022 1454   KETONESUR 5 (A) 01/19/2023 1702   PROTEINUR 30 (A) 01/19/2023 1702   UROBILINOGEN negative (A) 11/04/2019 1419   UROBILINOGEN  0.2 04/23/2009 0911   NITRITE NEGATIVE 01/19/2023 1702   LEUKOCYTESUR NEGATIVE 01/19/2023 1702   Sepsis Labs Recent Labs  Lab 01/19/23 1708 01/21/23 0940  WBC 13.0* 13.4*   Microbiology Recent Results (from the past 240 hour(s))   Anaerobic and Aerobic Culture     Status: None   Collection Time: 01/12/23  3:41 PM   Specimen: Body Fluid  Result Value Ref Range Status   MICRO NUMBER: [id number]  Final   SPECIMEN QUALITY: Adequate  Final   Source: FLUID, SYNOVIAL  Final   STATUS: FINAL  Final   GRAM STAIN:   Final    No organisms or white blood cells seen No epithelial cells seen   ANA RESULT: No anaerobes isolated.  Final   MICRO NUMBER: [id number]  Final   SPECIMEN QUALITY: Adequate  Final   SOURCE: FLUID, SYNOVIAL  Final   STATUS: FINAL  Final   AER RESULT: No Growth  Final  Culture, blood (Routine X 2) w Reflex to ID Panel     Status: None (Preliminary result)   Collection Time: 01/20/23 10:22 AM   Specimen: BLOOD RIGHT HAND  Result Value Ref Range Status   Specimen Description BLOOD RIGHT HAND  Final   Special Requests   Final    BOTTLES DRAWN AEROBIC AND ANAEROBIC Blood Culture adequate volume   Culture   Final    NO GROWTH 2 DAYS Performed at [facility], [address] [place], [place] [address]    Report Status PENDING  Incomplete  Culture, blood (Routine X 2) w Reflex to ID Panel     Status: None (Preliminary result)   Collection Time: 01/20/23 10:37 AM   Specimen: BLOOD RIGHT ARM  Result Value Ref Range Status   Specimen Description BLOOD RIGHT ARM  Final   Special Requests   Final    BOTTLES DRAWN AEROBIC AND ANAEROBIC Blood Culture adequate volume   Culture   Final    NO GROWTH 2 DAYS Performed at [facility], [address], [place], [place] [address]    Report Status PENDING  Incomplete  Body fluid culture w Gram Stain     Status: None (Preliminary result)   Collection Time: 01/20/23 11:25 AM   Specimen: Synovium; Body Fluid  Result Value Ref Range Status   Specimen Description SYNOVIAL  Final   Special Requests RT KNEE  Final   Gram Stain   Final    FEW WBC PRESENT, PREDOMINANTLY PMN NO ORGANISMS SEEN    Culture   Final    FEW GROUP B  STREP(S.AGALACTIAE)ISOLATED TESTING AGAINST S. AGALACTIAE NOT ROUTINELY PERFORMED DUE TO PREDICTABILITY OF AMP/PEN/VAN SUSCEPTIBILITY. Performed at [facility], [address], [place], [place] [address]    Report Status PENDING  Incomplete     Time coordinating discharge: 35 minutes  SIGNED:   Glade Lloyd, MD  [facility] 01/22/2023, 10:31 AM

## 2023-01-22 NOTE — Progress Notes (Signed)
   01/22/23 0041  BiPAP/CPAP/SIPAP  BiPAP/CPAP/SIPAP Pt Type Adult  BiPAP/CPAP/SIPAP Resmed  Reason BIPAP/CPAP not in use Non-compliant (pt refused cpap)

## 2023-01-22 NOTE — Plan of Care (Signed)
Problem: Education: Goal: Knowledge of General Education information will improve Description: Including pain rating scale, medication(s)/side effects and non-pharmacologic comfort measures Outcome: Adequate for Discharge

## 2023-01-22 NOTE — TOC Transition Note (Signed)
Transition of Care [facility]) - CM/SW Discharge Note   Patient Details  Name: Johnny [name] MRN: [id number] Date of Birth: Mar 09, 1956  [facility] CM/SW Contact:  Tom-Johnson, Hershal Coria, RN Phone Number: 01/22/2023, 1:07 PM   Clinical Narrative:     Patient is scheduled for discharge today.  Readmission Risk Assessment done. Outpatient PT referral, hospital f/u and discharge instructions on AVS. RW and BSC to be delivered to patient at bedside from [employer].  Wife, Pargie to transport at discharge.  No further TOC needs noted.             Final next level of care: OP Rehab Barriers to Discharge: Barriers Resolved   Patient Goals and CMS Choice CMS Medicare.gov Compare Post Acute Care list provided to:: Patient Choice offered to / list presented to : Patient  Discharge Placement                  Patient to be transferred to facility by: Wife Name of family member notified: Pargie    Discharge Plan and Services Additional resources added to the After Visit Summary for                  DME Arranged: Walker rolling, Bedside commode DME Agency: [employer] Date DME Agency Contacted: 01/21/23 Time DME Agency Contacted: [phone] Representative spoke with at DME Agency: Earna Coder HH Arranged: NA HH Agency: NA        Social Determinants of Health (SDOH) Interventions SDOH Screenings   Food Insecurity: No Food Insecurity (01/20/2023)  Housing: Low Risk  (01/20/2023)  Transportation Needs: No Transportation Needs (01/20/2023)  Utilities: Not At Risk (01/20/2023)  Depression (PHQ2-9): Low Risk  (01/10/2022)  Tobacco Use: Medium Risk (01/20/2023)     Readmission Risk Interventions    01/22/2023   12:29 PM  Readmission Risk Prevention Plan  Transportation Screening Complete  PCP or Specialist Appt within 5-7 Days Complete  Home Care Screening Complete  Medication Review (RN CM) Referral to Pharmacy

## 2023-01-23 ENCOUNTER — Telehealth: Payer: Self-pay

## 2023-01-23 LAB — BODY FLUID CULTURE W GRAM STAIN

## 2023-01-23 NOTE — Transitions of Care (Post Inpatient/ED Visit) (Unsigned)
01/23/2023  Name: Johnny [name] MRN: [id number] DOB: 1955-07-25  Today's TOC FU Call Status: Today's TOC FU Call Status:: Unsuccessful Call (1st Attempt) Unsuccessful Call (1st Attempt) Date: 01/23/23  Attempted to reach the patient regarding the most recent Inpatient/ED visit.  Follow Up Plan: Additional outreach attempts will be made to reach the patient to complete the Transitions of Care (Post Inpatient/ED visit) call.   Signature Karena Addison, LPN [facility] Nurse Health Advisor Direct Dial [phone]

## 2023-01-25 LAB — CULTURE, BLOOD (ROUTINE X 2)
Culture: NO GROWTH
Culture: NO GROWTH
Special Requests: ADEQUATE
Special Requests: ADEQUATE

## 2023-01-27 NOTE — Transitions of Care (Post Inpatient/ED Visit) (Signed)
01/27/2023  Name: Johnny [name] MRN: [id number] DOB: 1956/03/31  Today's TOC FU Call Status: Today's TOC FU Call Status:: Successful TOC FU Call Completed Unsuccessful Call (1st Attempt) Date: 01/23/23 [facility] FU Call Complete Date: 01/27/23 Patient's Name and Date of Birth confirmed.  Transition Care Management Follow-up Telephone Call Date of Discharge: 01/22/23 Discharge Facility: [facility]) Type of Discharge: Inpatient Admission Primary Inpatient Discharge Diagnosis:: effusion How have you been since you were released from the hospital?: Better Any questions or concerns?: No  Items Reviewed: Did you receive and understand the discharge instructions provided?: Yes Medications obtained,verified, and reconciled?: Yes (Medications Reviewed) Any new allergies since your discharge?: No Dietary orders reviewed?: Yes Do you have support at home?: Yes People in Home: spouse  Medications Reviewed Today: Medications Reviewed Today     Reviewed by Karena Addison, LPN (Licensed Practical Nurse) on 01/27/23 at 1525  Med List Status: <None>   Medication Order Taking? Sig Documenting Provider Last Dose Status Informant  carvedilol (COREG) 25 MG tablet [id number] No Take 1 tablet (25 mg total) by mouth 2 (two) times daily with a meal. Myrlene Broker, MD 01/18/2023 Expired 01/20/23 2359 Self, Spouse/Significant Other, Pharmacy Records  diclofenac Sodium (VOLTAREN) 1 % GEL [id number]  Apply 2 g topically 4 (four) times daily. Glade Lloyd, MD  Active   ELIQUIS 5 MG TABS tablet [id number] No TAKE 1 TABLET BY MOUTH TWICE A DAY Quintella Reichert, MD 01/18/2023 1800 Active Self, Spouse/Significant Other, Pharmacy Records  finasteride (PROSCAR) 5 MG tablet [id number] No TAKE 1 TABLET (5 MG TOTAL) BY MOUTH DAILY. McKenzie, Mardene Celeste, MD Past Week Active Self, Spouse/Significant Other, Pharmacy Records  glipiZIDE (GLUCOTROL) 5 MG tablet [id number] No Take 1 tablet (5 mg total) by mouth 2 (two)  times daily before a meal. Shamleffer, Konrad Dolores, MD Past Week Active Self, Spouse/Significant Other, Pharmacy Records  hydrALAZINE (APRESOLINE) 50 MG tablet [id number]  Take 1 tablet (50 mg total) by mouth 3 (three) times daily. Glade Lloyd, MD  Active   HYDROcodone-acetaminophen (NORCO/VICODIN) 5-325 MG tablet [id number] No Take 1 tablet by mouth every 4 (four) hours as needed for moderate pain. Persons, [place], [place] Past Month Active Self, Spouse/Significant Other, Pharmacy Records  isosorbide mononitrate (IMDUR) 60 MG 24 hr tablet [id number] No TAKE 1 TABLET BY MOUTH EVERY DAY Myrlene Broker, MD Past Week Active Self, Spouse/Significant Other, Pharmacy Records  Turmeric (QC TUMERIC COMPLEX) 500 MG CAPS [id number] No Take 1,000 mg by mouth daily. [provider] Past Month Active Self, Spouse/Significant Other, Pharmacy Records            Home Care and Equipment/Supplies: Were Home Health Services Ordered?: NA Any new equipment or medical supplies ordered?: Yes Name of Medical supply agency?: [facility] Were you able to get the equipment/medical supplies?: Yes Do you have any questions related to the use of the equipment/supplies?: No  Functional Questionnaire: Do you need assistance with bathing/showering or dressing?: Yes Do you need assistance with meal preparation?: Yes Do you need assistance with eating?: No Do you have difficulty maintaining continence: No Do you need assistance with getting out of bed/getting out of a chair/moving?: Yes Do you have difficulty managing or taking your medications?: No  Follow up appointments reviewed: PCP Follow-up appointment confirmed?: Yes Date of PCP follow-up appointment?: 02/03/23 Follow-up Provider: Okey Dupre Do you need transportation to your follow-up appointment?: No Do you understand care options if your condition(s) worsen?: Yes-patient verbalized understanding    SIGNATURE Fe Okubo  Deloria Lair, LPN [facility] Nurse  Health Advisor Direct Dial [phone]

## 2023-01-28 ENCOUNTER — Ambulatory Visit (INDEPENDENT_AMBULATORY_CARE_PROVIDER_SITE_OTHER): Payer: BC Managed Care – PPO | Admitting: Podiatry

## 2023-01-28 DIAGNOSIS — E1142 Type 2 diabetes mellitus with diabetic polyneuropathy: Secondary | ICD-10-CM | POA: Diagnosis not present

## 2023-01-28 DIAGNOSIS — M79675 Pain in left toe(s): Secondary | ICD-10-CM | POA: Diagnosis not present

## 2023-01-28 DIAGNOSIS — M79674 Pain in right toe(s): Secondary | ICD-10-CM

## 2023-01-28 DIAGNOSIS — B351 Tinea unguium: Secondary | ICD-10-CM

## 2023-01-28 NOTE — Progress Notes (Signed)
  Subjective:  Patient ID: Johnny [name], male    DOB: [birth year],  MRN: [id number]  Chief Complaint  Patient presents with   Nail Problem    Nail trim    67 y.o. male returns for the above complaint.  Patient presents with thickened elongated dystrophic toenails x10.  Patient is a painful to walk on.  Patient would like to have them debrided down as he is not able to do it himself.  Patient is a diabetic with last A1c of 7.6.  He does not have any secondary complaints.  Objective:  There were no vitals filed for this visit. Podiatric Exam: Vascular: dorsalis pedis and posterior tibial pulses are palpable bilateral. Capillary return is immediate. Temperature gradient is WNL. Skin turgor WNL  Sensorium: Normal Semmes Weinstein monofilament test. Normal tactile sensation bilaterally. Nail Exam: Pt has thick disfigured discolored nails with subungual debris noted bilateral entire nail hallux through fifth toenails.  Pain on palpation to the nails. Ulcer Exam: There is no evidence of ulcer or pre-ulcerative changes or infection. Orthopedic Exam: Muscle tone and strength are WNL. No limitations in general ROM. No crepitus or effusions noted. HAV  B/L.  Hammer toes 2-5  B/L. Skin: No Porokeratosis. No infection or ulcers.  Bilateral severe xerosis noted to bilateral lower extremity    Assessment & Plan:   No diagnosis found.        Patient was evaluated and treated and all questions answered.  Xerosis skin severe bilateral lower extremity -Clinically improved   Onychomycosis with pain  -Nails palliatively debrided as below. -Educated on self-care  Procedure: Nail Debridement Rationale: pain  Type of Debridement: manual, sharp debridement. Instrumentation: Nail nipper, rotary burr. Number of Nails: 10  Procedures and Treatment: Consent by patient was obtained for treatment procedures. The patient understood the discussion of treatment and procedures well. All questions were  answered thoroughly reviewed. Debridement of mycotic and hypertrophic toenails, 1 through 5 bilateral and clearing of subungual debris. No ulceration, no infection noted.  Return Visit-Office Procedure: Patient instructed to return to the office for a follow up visit 3 months for continued evaluation and treatment.  Nicholes Rough, DPM    No follow-ups on file.

## 2023-01-29 ENCOUNTER — Ambulatory Visit (INDEPENDENT_AMBULATORY_CARE_PROVIDER_SITE_OTHER): Payer: BC Managed Care – PPO | Admitting: Physician Assistant

## 2023-01-29 ENCOUNTER — Encounter: Payer: Self-pay | Admitting: Physician Assistant

## 2023-01-29 DIAGNOSIS — M2550 Pain in unspecified joint: Secondary | ICD-10-CM

## 2023-01-29 DIAGNOSIS — M25561 Pain in right knee: Secondary | ICD-10-CM

## 2023-01-29 MED ORDER — HYDROCODONE-ACETAMINOPHEN 5-325 MG PO TABS: 1.0000 | ORAL_TABLET | ORAL | 0 refills | Status: DC | PRN

## 2023-01-29 NOTE — Progress Notes (Signed)
Office Visit Note   Patient: Johnny [name]           Date of Birth: 12-22-1955           MRN: [id number] Visit Date: 01/29/2023              Requested by: Johnny [name] [address] [place],  [place] [address] PCP: Johnny [name]  Chief Complaint  Patient presents with   Right Knee - Pain      HPI: Johnny [name] is a 67 year old gentleman who I have seen in the past for his right knee.  He has had ongoing pain with his right knee.  He has had lower extremity swelling.  He was recently in the hospital and was consulted by Johnny [name].  There were concerns for a septic joint.  He did have an aspiration done by me in the office as well as 1 at the hospital that did not show any organisms.  He was given an injection of steroid and thinks this definitely helped him but still has difficulty with bearing weight.  Denies any fever or chills 1. Right knee pain, unspecified chronicity   2. Polyarthralgia     Plan: I reviewed his hospital records.  He did have an elevated uric acid though no crystals in the aspirate taken.  May be that the blood was hemolyzed but will redraw uric acid today.  Also have given him a referral to rheumatology given his psoriasis and elevated rheumatoid factor and inflammatory labs.  It may take a while for him to get in we will have him recheck with Johnny [name] this week.  Will give him a small amount of pain medication.  He understands he is to call here immediately if he has any return of his symptoms  Follow-Up Instructions: No follow-ups on file.   Ortho Exam  Patient is alert, oriented, no adenopathy, well-dressed, normal affect, normal respiratory effort. Examination of his right knee he has some warmth but no cellulitis.  Mild effusion.  Compartments are soft and compressible no open areas of skin he is wearing a support stocking controlling his swelling in his lower leg negative Homans' sign.  Does have some tenderness to palpation  globally  Imaging: No results found. No images are attached to the encounter.  Labs: Lab Results  Component Value Date   HGBA1C 9.8 (A) 12/01/2022   HGBA1C 7.8 (A) 05/27/2022   HGBA1C 7.5 (A) 12/27/2021   ESRSEDRATE 120 (H) 01/20/2023   CRP 40.2 (H) 01/20/2023   LABURIC 9.8 (H) 01/20/2023   REPTSTATUS 01/23/2023 FINAL 01/20/2023   GRAMSTAIN  01/20/2023    FEW WBC PRESENT, PREDOMINANTLY PMN NO ORGANISMS SEEN    CULT  01/20/2023    FEW GROUP B STREP(S.AGALACTIAE)ISOLATED TESTING AGAINST S. AGALACTIAE NOT ROUTINELY PERFORMED DUE TO PREDICTABILITY OF AMP/PEN/VAN SUSCEPTIBILITY. Performed at [facility], [address], [place], [place] [address]      Lab Results  Component Value Date   ALBUMIN 2.2 (L) 01/19/2023   ALBUMIN 3.6 03/28/2020   ALBUMIN 4.1 03/27/2020    Lab Results  Component Value Date   MG 2.8 (H) 01/22/2023   MG 2.0 03/28/2020   MG 2.0 07/17/2019   No results found for: "VD25OH"  No results found for: "PREALBUMIN"    Latest Ref Rng & Units 01/22/2023    9:43 AM 01/21/2023    9:40 AM 01/20/2023    5:46 AM  CBC EXTENDED  WBC  4.0 - 10.5 K/uL 12.0  13.4    RBC 4.22 - 5.81 MIL/uL 4.09  4.34    Hemoglobin 13.0 - 17.0 g/dL 78.2  95.6  21.3   HCT 39.0 - 52.0 % 35.6  37.3  40.0   Platelets 150 - 400 K/uL 509  543    NEUT# 1.7 - 7.7 K/uL 10.6     Lymph# 0.7 - 4.0 K/uL 0.6        There is no height or weight on file to calculate BMI.  Orders:  Orders Placed This Encounter  Procedures   Uric acid   Ambulatory referral to Rheumatology   Ambulatory referral to Orthopedic Surgery   No orders of the defined types were placed in this encounter.    Procedures: No procedures performed  Clinical Data: No additional findings.  ROS:  All other systems negative, except as noted in the HPI. Review of Systems  Objective: Vital Signs: There were no vitals taken for this visit.  Specialty Comments:  No specialty comments available.  PMFS  History: Patient Active Problem List   Diagnosis Date Noted   Knee pain, right 01/21/2023   Hyperlipidemia 01/20/2023   Sepsis (HCC) 01/20/2023   Effusion of right knee 01/20/2023   Hypercoagulable state due to typical atrial flutter (HCC) 01/13/2023   Pain in right knee 01/09/2023   Venous stasis of lower extremity 01/09/2023   BPH with obstruction/lower urinary tract symptoms 05/31/2020   Paroxysmal atrial flutter (HCC)    Adrenal nodule (HCC) 03/28/2020   OSA (obstructive sleep apnea) 03/28/2020   Morbid obesity (HCC) 03/28/2020   Acute renal failure superimposed on stage 3b chronic kidney disease (HCC) 03/28/2020   Medication noncompliance due to cognitive impairment 03/28/2020   Chronic systolic CHF (congestive heart failure) (HCC) 03/28/2020   Typical atrial flutter (HCC) 07/15/2019   CKD (chronic kidney disease) stage 3, GFR 30-59 ml/min (HCC) 06/10/2017   PSORIASIS, 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   Type 2 diabetes with complication (HCC) 08/03/2007   Hyperlipidemia associated with type 2 diabetes mellitus (HCC) 08/03/2007   Essential hypertension 08/03/2007   Past Medical History:  Diagnosis Date   CKD (chronic kidney disease) stage 3, GFR 30-59 ml/min (HCC) 06/10/2017   Constipation 03/27/2020   Diabetes mellitus    Dysrhythmia    A flutter   Essential hypertension 08/03/2007   Qualifier: Diagnosis of  By: Debby Bud [name], Rosalyn Gess  Med ARB, diuretic     Guttate psoriasis    Hyperlipidemia associated with type 2 diabetes mellitus (HCC) 08/03/2007   Qualifier: Diagnosis of  By: Debby Bud [name], Rosalyn Gess  meds - none    Paroxysmal atrial flutter (HCC)    S/P TEE/DCCV 07/2019   PSORIASIS, 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   Qualifier: Diagnosis of  By: Debby Bud [name], Rosalyn Gess    Sleep apnea    Type II diabetes mellitus with renal manifestations, uncontrolled 08/03/2007   Qualifier: Diagnosis of  By: Debby Bud [name], Rosalyn Gess  Med metformin     Family History  Problem Relation Age of Onset   Hypertension Father         DOB [birth year]   Hyperlipidemia Father    Diabetes Father    Coronary artery disease Father        with stents   Nephrolithiasis Father    Kidney disease Father        s/p rejected kidney transplant; on HD   Memory loss Mother        DOB 1939   Kidney disease  Mother        s/p nephrectomy, infection problem   Dementia Mother    Cancer Brother        lung   HIV Brother    Prostate cancer Neg Hx    Colon cancer Neg Hx     Past Surgical History:  Procedure Laterality Date   CARDIOVERSION N/A 07/18/2019   Procedure: CARDIOVERSION;  Surgeon: Lars Masson, [name];  Location: [facility] ENDOSCOPY;  Service: Cardiovascular;  Laterality: N/A;   TEE WITHOUT CARDIOVERSION N/A 07/18/2019   Procedure: TRANSESOPHAGEAL ECHOCARDIOGRAM (TEE);  Surgeon: Lars Masson, [name];  Location: [facility] ENDOSCOPY;  Service: Cardiovascular;  Laterality: N/A;   TOOTH EXTRACTION     XI ROBOTIC ASSISTED SIMPLE PROSTATECTOMY N/A 05/31/2020   Procedure: XI ROBOTIC ASSISTED SIMPLE PROSTATECTOMY-SUPRAPUBIC;  Surgeon: Malen Gauze, [name];  Location: [facility] ORS;  Service: Urology;  Laterality: N/A;   Social History   Occupational History   Occupation: mfg  Tobacco Use   Smoking status: Never    Passive exposure: Yes   Smokeless tobacco: Never  Vaping Use   Vaping status: Never Used  Substance and Sexual Activity   Alcohol use: No   Drug use: No   Sexual activity: Yes

## 2023-01-29 NOTE — Addendum Note (Signed)
Addended by: Polly Cobia on: 01/29/2023 03:19 PM   Modules accepted: Orders

## 2023-01-30 LAB — URIC ACID: Uric Acid, Serum: 4.5 mg/dL (ref 4.0–8.0)

## 2023-02-01 NOTE — Progress Notes (Unsigned)
Electrophysiology Office Note:   Date:  02/02/2023  ID:  Johnny [name], DOB 10/27/1955, MRN [id number]  Primary Cardiologist: Armanda Magic, MD Electrophysiologist: Nobie Putnam, MD      History of Present Illness:   Johnny [name] is a 67 y.o. male with h/o CKD stage 4, DM, HTN, psoriasis, aortic atherosclerosis, OSA and arrhythmia induced cardiomyopathy who is seen today for Electrophysiology evaluation of atrial flutter at the request of Dr. Mayford Knife.    Patient was reportedly diagnosed with atrial flutter initially in 2021.  At that time he was admitted and underwent TEE guided cardioversion.  While in atrial flutter, echocardiogram showed an LVEF of 35 to 40%.  Repeat echo after cardioversion while in normal sinus rhythm did show recovery of his LVEF to 60-65%.  Patient recently presented to the emergency room on 01/05/2023 for right knee pain and swelling and was found to be in atrial flutter during that admission.  He again underwent successful cardioversion to normal sinus rhythm.  Patient reports being asymptomatic during both episodes; the first presenting to the ED with flulike symptoms in the second with severe right knee pain.  He denies palpitations, chest pain, shortness of breath, dizziness, and lightheadedness.  Review of systems complete and found to be negative unless listed in HPI.   EP Information / Studies Reviewed:   EKG not ordered today.    EKG 03/27/20:    EKG 01/06/23:    Echocardiogram 08/19/2019: Normal LV size and function.  LVEF 60 to 65%.  Mild concentric LVH. Normal RV function.  Mildly enlarged RV with normal pulmonary artery systolic pressures. Moderately dilated left and right atrial size. No significant valvular heart disease mentioned.  TEE 07/18/19: -Performed in the setting of atrial flutter. Severely reduced LV systolic function, LVEF 25 to 78%.  Global hypokinesis with no wall motion abnormalities.  Risk Assessment/Calculations:    CHA2DS2-VASc  Score = 4   This indicates a 4.8% annual risk of stroke. The patient's score is based upon: CHF History: 1 (previous TCM) HTN History: 1 Diabetes History: 1 Stroke History: 0 Vascular Disease History: 1 (aortic atherosclerosis) Age Score: 0 Gender Score: 0        Physical Exam:   VS:  BP (!) 140/78 (BP Location: Left Arm, Patient Position: Sitting, Cuff Size: Large)   Pulse 90   Resp 16   Ht 6\' 5"  (1.956 m)   Wt 289 lb 9.6 oz (131.4 kg)   SpO2 98%   BMI 34.34 kg/m    Wt Readings from Last 3 Encounters:  02/02/23 289 lb 9.6 oz (131.4 kg)  01/22/23 294 lb 15.6 oz (133.8 kg)  01/13/23 299 lb 9.6 oz (135.9 kg)     GEN: Well nourished, well developed in no acute distress NECK: No JVD; No carotid bruits CARDIAC: Regular rate and rhythm, no murmurs, rubs, gallops RESPIRATORY:  Clear to auscultation without rales, wheezing or rhonchi  ABDOMEN: Soft, non-tender, non-distended EXTREMITIES: Bilateral edema present; No deformity   ASSESSMENT AND PLAN:   Johnny [name] is a 67 y.o. male with h/o CKD stage 4, DM, HTN, psoriasis, aortic atherosclerosis, OSA and arrhythmia induced cardiomyopathy who is seen today for Electrophysiology evaluation of atrial flutter at the request of Dr. Mayford Knife.  Patient has had multiple episodes of typical atrial flutter.  Importantly, during 1 of these episodes echocardiogram was done and showed depressed LV function which did improve post cardioversion, consistent with an arrhythmia induced cardiomyopathy.  Given this, I think it is  important that we prevent further episodes with a rhythm control strategy even though he has minimal symptoms.  Currently, his most pressing problem is his severe right knee pain for which she is getting evaluation by both orthopedics and rheumatology.  Ablation would commit him to 3 months of uninterrupted anticoagulation. Given his ongoing/complete evaluation for his knee pain, he would like to delay ablation until knee workup has  been completed in the event that he needs further arthrocentesis or surgery.  #. Typical atrial flutter: #.  Arrhythmia induced cardiomyopathy: -His CKD stage IV and other comorbidities limit antiarrhythmic options.  We will use oral amiodarone in the short-term hopefully as a bridge to ablation. -Given minimal symptoms, and suspicion for atrial fibrillation, we will give him a 2-week outpatient cardiac monitor to assess for recurrence of atrial arrhythmia.  #.  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 -Continue Eliquis.  Tolerating without issue.  #. Hypertension:  -Above goal today.  Emphasized the importance of well-controlled blood pressure on preventing occurrences of atrial fibrillation and atrial flutter.   -Continue current regimen and close follow-up with primary care.  #.  Obstructive sleep apnea: -He is compliant with CPAP.  Follow up with Dr. Jimmey Ralph  in 6 weeks.    Total time of encounter: 63 minutes total time of encounter, including chart review, face-to-face patient care, coordination of care and counseling regarding high complexity medical decision making.   Signed, Nobie Putnam, MD

## 2023-02-02 ENCOUNTER — Ambulatory Visit: Payer: BC Managed Care – PPO | Attending: Cardiology

## 2023-02-02 ENCOUNTER — Ambulatory Visit: Payer: BC Managed Care – PPO | Attending: Cardiology | Admitting: Cardiology

## 2023-02-02 ENCOUNTER — Encounter: Payer: Self-pay | Admitting: Cardiology

## 2023-02-02 VITALS — BP 140/78 | HR 90 | Resp 16 | Ht 77.0 in | Wt 289.6 lb

## 2023-02-02 DIAGNOSIS — I483 Typical atrial flutter: Secondary | ICD-10-CM

## 2023-02-02 DIAGNOSIS — G4733 Obstructive sleep apnea (adult) (pediatric): Secondary | ICD-10-CM

## 2023-02-02 DIAGNOSIS — I4892 Unspecified atrial flutter: Secondary | ICD-10-CM | POA: Diagnosis not present

## 2023-02-02 DIAGNOSIS — D6869 Other thrombophilia: Secondary | ICD-10-CM

## 2023-02-02 DIAGNOSIS — I10 Essential (primary) hypertension: Secondary | ICD-10-CM

## 2023-02-02 MED ORDER — AMIODARONE HCL 200 MG PO TABS: ORAL_TABLET | ORAL | 3 refills | Status: DC

## 2023-02-02 NOTE — Patient Instructions (Signed)
Medication Instructions:  Your physician has recommended you make the following change in your medication:  1) START taking amiodarone 200 mg twice daily for 7 days, then decrease to 200 mg once daily thereafter  *If you need a refill on your cardiac medications before your next appointment, please call your pharmacy*   Testing/Procedures: Your physician has recommended that you wear an event monitor. Event monitors are medical devices that record the heart's electrical activity. Doctors most often [place] these monitors to diagnose arrhythmias. Arrhythmias are problems with the speed or rhythm of the heartbeat. The monitor is a small, portable device. You can wear one while you do your normal daily activities. This is usually used to diagnose what is causing palpitations/syncope (passing out).  Follow-Up: At [facility], you and your health needs are our priority.  As part of our continuing mission to provide you with exceptional heart care, we have created designated Provider Care Teams.  These Care Teams include your primary Cardiologist (physician) and Advanced Practice Providers (APPs -  Physician Assistants and Nurse Practitioners) who all work together to provide you with the care you need, when you need it.  Your next appointment:   6 weeks  Provider:   Nobie Putnam, MD    Other Instructions Johnny [name]- Long Term Monitor Instructions  Your physician has requested you wear a ZIO patch monitor for 14 days.  This is a single patch monitor. [employer] supplies one patch monitor per enrollment. Additional stickers are not available. Please do not apply patch if you will be having a Nuclear Stress Test,  Echocardiogram, Cardiac CT, MRI, or Chest Xray during the period you would be wearing the  monitor. The patch cannot be worn during these tests. You cannot remove and re-apply the  ZIO XT patch monitor.  Your ZIO patch monitor will be mailed 3 day USPS to your address on file. It may  take 3-5 days  to receive your monitor after you have been enrolled.  Once you have received your monitor, please review the enclosed instructions. Your monitor  has already been registered assigning a specific monitor serial # to you.  Billing and Patient Assistance Program Information  We have supplied [employer] with any of your insurance information on file for billing purposes. [employer] offers a sliding scale Patient Assistance Program for patients that do not have  insurance, or whose insurance does not completely cover the cost of the ZIO monitor.  You must apply for the Patient Assistance Program to qualify for this discounted rate.  To apply, please call [employer] at [phone], select option 4, select option 2, ask to apply for  Patient Assistance Program. Meredeth Ide will ask your household income, and how many people  are in your household. They will quote your out-of-pocket cost based on that information.  [employer] will also be able to set up a 69-month, interest-free payment plan if needed.  Applying the monitor   Shave hair from upper left chest.  Hold abrader disc by orange tab. Rub abrader in 40 strokes over the upper left chest as  indicated in your monitor instructions.  Clean area with 4 enclosed alcohol pads. Let dry.  Apply patch as indicated in monitor instructions. Patch will be placed under collarbone on left  side of chest with arrow pointing upward.  Rub patch adhesive wings for 2 minutes. Remove white label marked "1". Remove the white  label marked "2". Rub patch adhesive wings for 2 additional minutes.  While looking in  a mirror, press and release button in center of patch. A small green light will  flash 3-4 times. This will be your only indicator that the monitor has been turned on.  Do not shower for the first 24 hours. You may shower after the first 24 hours.  Press the button if you feel a symptom. You will hear a small click. Record Date, Time and  Symptom in  the Patient Logbook.  When you are ready to remove the patch, follow instructions on the last 2 pages of Patient  Logbook. Stick patch monitor onto the last page of Patient Logbook.  Place Patient Logbook in the blue and white box. Use locking tab on box and tape box closed  securely. The blue and white box has prepaid postage on it. Please place it in the mailbox as  soon as possible. Your physician should have your test results approximately 7 days after the  monitor has been mailed back to [facility].  Call [facility] Customer Care at [phone] if you have questions regarding  your ZIO XT patch monitor. Call them immediately if you see an orange light blinking on your  monitor.  If your monitor falls off in less than 4 days, contact our Monitor department at [phone].  If your monitor becomes loose or falls off after 4 days call [employer] at [phone] for  suggestions on securing your monitor

## 2023-02-02 NOTE — Progress Notes (Unsigned)
Enrolled for [employer] to mail a ZIO XT long term holter monitor to the patients address on file.  

## 2023-02-03 ENCOUNTER — Inpatient Hospital Stay: Payer: BC Managed Care – PPO | Admitting: Internal Medicine

## 2023-02-09 ENCOUNTER — Ambulatory Visit: Payer: BC Managed Care – PPO | Admitting: Internal Medicine

## 2023-02-09 ENCOUNTER — Telehealth: Payer: Self-pay | Admitting: Cardiology

## 2023-02-09 ENCOUNTER — Encounter: Payer: Self-pay | Admitting: Internal Medicine

## 2023-02-09 VITALS — BP 166/86 | HR 92 | Temp 98.4°F | Ht 77.0 in | Wt 285.0 lb

## 2023-02-09 DIAGNOSIS — R768 Other specified abnormal immunological findings in serum: Secondary | ICD-10-CM | POA: Diagnosis not present

## 2023-02-09 DIAGNOSIS — I4892 Unspecified atrial flutter: Secondary | ICD-10-CM

## 2023-02-09 DIAGNOSIS — N184 Chronic kidney disease, stage 4 (severe): Secondary | ICD-10-CM

## 2023-02-09 DIAGNOSIS — M25461 Effusion, right knee: Secondary | ICD-10-CM

## 2023-02-09 NOTE — Therapy (Deleted)
OUTPATIENT PHYSICAL THERAPY LOWER EXTREMITY EVALUATION   Patient Name: Johnny [name] MRN: [id number] DOB:1956/04/21, 67 y.o., male Today's Date: 02/09/2023  END OF SESSION:   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   Qualifier: Diagnosis of  By: Debby Bud MD, Rosalyn Gess    Sleep apnea    Type II diabetes mellitus with renal manifestations, uncontrolled 08/03/2007   Qualifier: Diagnosis of  By: Debby Bud MD, Rosalyn Gess  Med metformin    Past Surgical History:  Procedure Laterality Date   CARDIOVERSION N/A 07/18/2019   Procedure: CARDIOVERSION;  Surgeon: Lars Masson, MD;  Location: [facility] ENDOSCOPY;  Service: Cardiovascular;  Laterality: N/A;   TEE WITHOUT CARDIOVERSION N/A 07/18/2019   Procedure: TRANSESOPHAGEAL ECHOCARDIOGRAM (TEE);  Surgeon: Lars Masson, MD;  Location: [facility] ENDOSCOPY;  Service: Cardiovascular;  Laterality: N/A;   TOOTH EXTRACTION     XI ROBOTIC ASSISTED SIMPLE PROSTATECTOMY N/A 05/31/2020   Procedure: XI ROBOTIC ASSISTED SIMPLE PROSTATECTOMY-SUPRAPUBIC;  Surgeon: Malen Gauze, MD;  Location: [facility] ORS;  Service: Urology;  Laterality: N/A;   Patient Active Problem List   Diagnosis Date Noted   Knee pain, right 01/21/2023   Hyperlipidemia 01/20/2023   Sepsis (HCC) 01/20/2023   Effusion of right knee 01/20/2023   Hypercoagulable state due to typical atrial flutter (HCC) 01/13/2023   Pain in right knee 01/09/2023   Venous stasis of lower extremity 01/09/2023    BPH with obstruction/lower urinary tract symptoms 05/31/2020   Paroxysmal atrial flutter (HCC)    Adrenal nodule (HCC) 03/28/2020   OSA (obstructive sleep apnea) 03/28/2020   Morbid obesity (HCC) 03/28/2020   Acute renal failure superimposed on stage 3b chronic kidney disease (HCC) 03/28/2020   Medication noncompliance due to cognitive impairment 03/28/2020   Chronic systolic CHF (congestive heart failure) (HCC) 03/28/2020   Typical atrial flutter (HCC) 07/15/2019   CKD (chronic kidney disease) stage 3, GFR 30-59 ml/min (HCC) 06/10/2017   PSORIASIS, 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  Rationale for Evaluation and Treatment: Rehabilitation  ONSET DATE: ***  SUBJECTIVE:   SUBJECTIVE STATEMENT: ***  PERTINENT HISTORY: Treyvaughn is a 67 year old gentleman who I have seen in the past for his right knee.  He has had ongoing pain with his right knee.  He has had lower extremity swelling.  He was recently in the hospital and was consulted by Dr. Roda Shutters.  There were concerns for a septic joint.  He did have an aspiration done by me in the office as well as 1 at the hospital that did not show any organisms.  He was given an injection  of steroid and thinks this definitely helped him but still has difficulty with bearing weight.  Denies any fever or chills 1. Right knee pain, unspecified chronicity   2. Polyarthralgia    PAIN:  Are you having pain? {OPRCPAIN:27236}  PRECAUTIONS: None  RED FLAGS: None   WEIGHT BEARING RESTRICTIONS: No  FALLS:  Has patient fallen in last 6 months? No  OCCUPATION: ***  PLOF: Independent  PATIENT GOALS:  ***  NEXT MD VISIT: ***  OBJECTIVE:  Note: Objective measures were completed at Evaluation unless otherwise noted.  DIAGNOSTIC FINDINGS: CLINICAL DATA:  Right knee pain and swelling.   EXAM: RIGHT KNEE - COMPLETE 4+ VIEW   COMPARISON:  None Available.   FINDINGS: Patellofemoral and medial tibiofemoral peripheral spurring. No fracture or erosive change. Moderate knee joint effusion. No focal bone lesion. Mild soft tissue edema.   IMPRESSION: Mild osteoarthritis with moderate knee joint effusion.     Electronically Signed   By: Narda Rutherford M.D.   On: 01/05/2023 21:54  PATIENT SURVEYS:  FOTO ***  MUSCLE LENGTH: Hamstrings: Right *** deg; Left *** deg Maisie Fus test: Right *** deg; Left *** deg  POSTURE: {posture:25561}  PALPATION: ***  LOWER EXTREMITY ROM:  {AROM/PROM:27142} ROM Right eval Left eval  Hip flexion    Hip extension    Hip abduction    Hip adduction    Hip internal rotation    Hip external rotation    Knee flexion    Knee extension    Ankle dorsiflexion    Ankle plantarflexion    Ankle inversion    Ankle eversion     (Blank rows = not tested)  LOWER EXTREMITY MMT:  MMT Right eval Left eval  Hip flexion    Hip extension    Hip abduction    Hip adduction    Hip internal rotation    Hip external rotation    Knee flexion    Knee extension    Ankle dorsiflexion    Ankle plantarflexion    Ankle inversion    Ankle eversion     (Blank rows = not tested)  LOWER EXTREMITY SPECIAL TESTS:  {LEspecialtests:26242}  FUNCTIONAL TESTS:  {Functional tests:24029}  GAIT: Distance walked: *** Assistive device utilized: {Assistive devices:23999} Level of assistance: {Levels of assistance:24026} Comments: ***   TODAY'S TREATMENT:                                                                                                                              DATE: ***    PATIENT EDUCATION:  Education details: Discussed eval findings, rehab  rationale and POC and patient is in agreement  Person educated: Patient Education method: Explanation Education comprehension: verbalized understanding and needs further education  HOME EXERCISE PROGRAM: ***  ASSESSMENT:  CLINICAL IMPRESSION: Patient is a *** y.o. *** who was seen today for physical therapy evaluation and treatment for ***.   OBJECTIVE IMPAIRMENTS: {opptimpairments:25111}.   ACTIVITY LIMITATIONS: {activitylimitations:27494}  PARTICIPATION LIMITATIONS: {participationrestrictions:25113}  PERSONAL FACTORS: {Personal factors:25162} are also affecting patient's functional outcome.   REHAB POTENTIAL: Fair due etiology of symptoms  CLINICAL DECISION MAKING: Evolving/moderate complexity  EVALUATION COMPLEXITY: Low   GOALS: Goals reviewed with patient? {yes/no:20286}  SHORT TERM GOALS: Target date: *** *** Baseline: Goal status: INITIAL  2.  *** Baseline:  Goal status: INITIAL  3.  *** Baseline:  Goal status: INITIAL  4.  *** Baseline:  Goal status: INITIAL  5.  *** Baseline:  Goal status: INITIAL  6.  *** Baseline:  Goal status: INITIAL  LONG TERM GOALS: Target date: ***  *** Baseline:  Goal status: INITIAL  2.  *** Baseline:  Goal status: INITIAL  3.  *** Baseline:  Goal status: INITIAL  4.  *** Baseline:  Goal status: INITIAL  5.  *** Baseline:  Goal status: INITIAL  6.  *** Baseline:  Goal status: INITIAL   PLAN:  PT FREQUENCY: 1-2x/week  PT DURATION: 6 weeks  PLANNED INTERVENTIONS: Therapeutic exercises, Therapeutic activity, Neuromuscular re-education, Balance training, Gait training, Patient/Family education, Self Care, Joint mobilization, Stair training, Aquatic Therapy, Dry Needling, Electrical stimulation, Cryotherapy, Moist heat, Manual therapy, and Re-evaluation  PLAN FOR NEXT SESSION: HEP review and update, manual techniques as appropriate, aerobic tasks, ROM and flexibility activities, strengthening and  PREs, TPDN, gait and balance training as needed     Hildred Laser, PT 02/09/2023, 11:47 AM

## 2023-02-09 NOTE — Telephone Encounter (Signed)
Patient states his heart monitor didn't stay on for longer than 4 days and he would like to know what he needs to do next. Please advise.

## 2023-02-09 NOTE — Telephone Encounter (Signed)
Patient instructed to mail back first monitor to be processed.  We will have a replacement monitor shipped to his address on file.  [employer] will combine the results of the 2 monitors.  Charges on first monitor to be cancelled.

## 2023-02-09 NOTE — Patient Instructions (Addendum)
Ask your orthopedic doctor if an MRI is needed for the knee.  We will check the labs.

## 2023-02-09 NOTE — Progress Notes (Unsigned)
Subjective:   Patient ID: Johnny [name], male    DOB: 01-Dec-1955, 67 y.o.   MRN: [id number]  HPI The patient is a 67 YO man coming in for ongoing concerns and recent hospitalization about a month ago. Started with right knee pain prior to that and swelling. Ortho multiple visits without resolution. Had steroid injection in knee in hospital which helped some. Still struggling with the knee. Sugars were up at last HgA1c. Have been controlled last few weeks at home with focus on diet. Had a positive rheumatoid factor recently which may be related to knee pain.   Review of Systems  Constitutional:  Positive for activity change.  HENT: Negative.    Eyes: Negative.   Respiratory:  Negative for cough, chest tightness and shortness of breath.   Cardiovascular:  Negative for chest pain, palpitations and leg swelling.  Gastrointestinal:  Negative for abdominal distention, abdominal pain, constipation, diarrhea, nausea and vomiting.  Musculoskeletal:  Positive for arthralgias and myalgias.  Skin: Negative.   Neurological: Negative.   Psychiatric/Behavioral: Negative.      Objective:  Physical Exam Constitutional:      Appearance: He is well-developed.  HENT:     Head: Normocephalic and atraumatic.  Cardiovascular:     Rate and Rhythm: Normal rate. Rhythm irregular.  Pulmonary:     Effort: Pulmonary effort is normal. No respiratory distress.     Breath sounds: Normal breath sounds. No wheezing or rales.  Abdominal:     General: Bowel sounds are normal. There is no distension.     Palpations: Abdomen is soft.     Tenderness: There is no abdominal tenderness. There is no rebound.  Musculoskeletal:        General: Tenderness present.     Cervical back: Normal range of motion.  Skin:    General: Skin is warm and dry.  Neurological:     Mental Status: He is alert and oriented to person, place, and time.     Coordination: Coordination normal.     Vitals:   02/09/23 1513 02/09/23  1515  BP: (!) 166/86 (!) 166/86  Pulse: 92   Temp: 98.4 F (36.9 C)   TempSrc: Oral   SpO2: 97%   Weight: 285 lb (129.3 kg)   Height: 6\' 5"  (1.956 m)     Assessment & Plan:

## 2023-02-10 ENCOUNTER — Encounter: Payer: Self-pay | Admitting: Orthopaedic Surgery

## 2023-02-10 ENCOUNTER — Ambulatory Visit (INDEPENDENT_AMBULATORY_CARE_PROVIDER_SITE_OTHER): Payer: BC Managed Care – PPO | Admitting: Orthopaedic Surgery

## 2023-02-10 DIAGNOSIS — R768 Other specified abnormal immunological findings in serum: Secondary | ICD-10-CM | POA: Insufficient documentation

## 2023-02-10 DIAGNOSIS — M25461 Effusion, right knee: Secondary | ICD-10-CM

## 2023-02-10 LAB — RENAL FUNCTION PANEL
Albumin: 3 g/dL — ABNORMAL LOW (ref 3.5–5.2)
BUN: 19 mg/dL (ref 6–23)
CO2: 23 meq/L (ref 19–32)
Calcium: 8.8 mg/dL (ref 8.4–10.5)
Chloride: 105 meq/L (ref 96–112)
Creatinine, Ser: 1.75 mg/dL — ABNORMAL HIGH (ref 0.40–1.50)
GFR: 40.53 mL/min — ABNORMAL LOW (ref >60.00)
Glucose, Bld: 173 mg/dL — ABNORMAL HIGH (ref 70–99)
Phosphorus: 2.4 mg/dL (ref 2.3–4.6)
Potassium: 3.9 meq/L (ref 3.5–5.1)
Sodium: 139 meq/L (ref 135–145)

## 2023-02-10 NOTE — Assessment & Plan Note (Signed)
Referral to rheumatology to see if they feel this right knee pain is related. He has not had MRI of the knee so there could be a torn tendon which better explains the sudden onset of pain and swelling. Checking anti ccp marker today to see if this is positive. He has CKD stage 4 and cannot take nsaids and cannot take prednisone currently due to poorly controlled diabetes.

## 2023-02-10 NOTE — Assessment & Plan Note (Signed)
Sounds irreg today but not fast. He is seeing cardiology with recent start of amiodarone.

## 2023-02-10 NOTE — Progress Notes (Signed)
Office Visit Note   Patient: Johnny [name]           Date of Birth: [birth year]           MRN: [id number] Visit Date: 02/10/2023              Requested by: Johnny Broker, MD [address] [place],  [place] [address] PCP: Johnny Broker, MD   Assessment & Plan: Visit Diagnoses:  1. Effusion, right knee     Plan: Johnny [name] is a 67 year old gentleman with unexplained right knee pain and effusion.  Possible causes can be septic arthritis, psoriatic arthritis, gout, rheumatologic process.  He has a fair amount of osteoarthritis as well.  Given the continued pain and that one of the cultures showed strep I think it is best to treat this aggressively as a septic arthritis with arthroscopic washout.  Fortunately he is not exhibiting any signs and symptoms of sepsis currently so we do not do this urgently as he is on Eliquis.  We Johnny [name] plan to put him on the schedule in the very near future.  Plan Johnny [name] be to take intraoperative cultures and to hold antibiotics preoperatively.  Follow-Up Instructions: No follow-ups on file.   Orders:  No orders of the defined types were placed in this encounter.  No orders of the defined types were placed in this encounter.     Procedures: No procedures performed   Clinical Data: No additional findings.   Subjective: Chief Complaint  Patient presents with   Right Knee - Follow-up    HPI  Johnny [name] is a 67 year old gentleman who initially saw Johnny [name] for 4-day history of right knee pain and swelling after doing yard work in September.  He was a poorly controlled diabetic.  Due to this Johnny [name] only aspirated the knee for pain relief and obtained 50 cc of fluid.  He then presented 3 days later with a recurrent effusion and she was able to get 90 cc out.  He had denied any constitutional symptoms.  The intra-articular white blood cell count was 30,000.  He then later presented to the ER for continued right knee pain and another aspiration  was performed and this time his white blood cell count was 18,000.  One of the cultures did grow group B strep.  Intra-articular cortisone was administered in the ED which seemed to have improved his pain.  He has continued to deny any signs or symptoms of infection.  Patient comes back today for ongoing right knee pain.  He does have psoriasis.  He has had a rheumatologic workup which was positive for rheumatoid factor.  His CRP and sed rate are both elevated.  Uric acid is elevated.  Review of Systems  Constitutional: Negative.   HENT: Negative.    Eyes: Negative.   Respiratory: Negative.    Cardiovascular: Negative.   Gastrointestinal: Negative.   Endocrine: Negative.   Genitourinary: Negative.   Skin: Negative.   Allergic/Immunologic: Negative.   Neurological: Negative.   Hematological: Negative.   Psychiatric/Behavioral: Negative.    All other systems reviewed and are negative.    Objective: Vital Signs: There were no vitals taken for this visit.  Physical Exam Vitals and nursing note reviewed.  Constitutional:      Appearance: He is well-developed.  HENT:     Head: Normocephalic and atraumatic.  Eyes:     Pupils: Pupils are equal, round, and reactive to light.  Pulmonary:     Effort: Pulmonary effort  is normal.  Abdominal:     Palpations: Abdomen is soft.  Musculoskeletal:        General: Normal range of motion.     Cervical back: Neck supple.  Skin:    General: Skin is warm.  Neurological:     Mental Status: He is alert and oriented to person, place, and time.  Psychiatric:        Behavior: Behavior normal.        Thought Content: Thought content normal.        Judgment: Judgment normal.     Ortho Exam Exam of the right knee shows a large joint effusion.  Limited range of motion secondary to pain.  He has widespread skin lesions due to the psoriasis. Specialty Comments:  No specialty comments available.  Imaging: No results found.   PMFS  History: Patient Active Problem List   Diagnosis Date Noted   Rheumatoid factor positive 02/10/2023   Knee pain, right 01/21/2023   Hyperlipidemia 01/20/2023   Effusion of right knee 01/20/2023   Hypercoagulable state due to typical atrial flutter (HCC) 01/13/2023   Pain in right knee 01/09/2023   Venous stasis of lower extremity 01/09/2023   BPH with obstruction/lower urinary tract symptoms 05/31/2020   Paroxysmal atrial flutter (HCC)    Adrenal nodule (HCC) 03/28/2020   OSA (obstructive sleep apnea) 03/28/2020   Morbid obesity (HCC) 03/28/2020   Medication noncompliance due to cognitive impairment 03/28/2020   Chronic systolic CHF (congestive heart failure) (HCC) 03/28/2020   CKD (chronic kidney disease) stage 4, GFR 15-29 ml/min (HCC) 06/10/2017   PSORIASIS, 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   Type 2 diabetes with complication (HCC) 08/03/2007   Hyperlipidemia associated with type 2 diabetes mellitus (HCC) 08/03/2007   Essential hypertension 08/03/2007   Past Medical History:  Diagnosis Date   CKD (chronic kidney disease) stage 3, GFR 30-59 ml/min (HCC) 06/10/2017   Constipation 03/27/2020   Diabetes mellitus    Dysrhythmia    A flutter   Essential hypertension 08/03/2007   Qualifier: Diagnosis of  By: Johnny Bud MD, Johnny [name]  Med ARB, diuretic     Guttate psoriasis    Hyperlipidemia associated with type 2 diabetes mellitus (HCC) 08/03/2007   Qualifier: Diagnosis of  By: Johnny Bud MD, Johnny [name]  meds - none    Paroxysmal atrial flutter (HCC)    S/P TEE/DCCV 07/2019   PSORIASIS, 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   Qualifier: Diagnosis of  By: Johnny Bud MD, Johnny [name]    Sleep apnea    Type II diabetes mellitus with renal manifestations, uncontrolled 08/03/2007   Qualifier: Diagnosis of  By: Johnny Bud MD, Johnny [name]  Med metformin     Family History  Problem Relation Age of Onset   Memory loss Mother        DOB 65   Kidney disease Mother        s/p nephrectomy, infection problem   Dementia Mother    Hypertension  Father        DOB [phone]   Hyperlipidemia Father    Diabetes Father    Coronary artery disease Father        with stents   Nephrolithiasis Father    Kidney disease Father        s/p rejected kidney transplant; on HD   Cancer Brother        lung   HIV Brother    Prostate cancer Neg Hx    Colon cancer Neg Hx     Past Surgical History:  Procedure  Laterality Date   CARDIOVERSION N/A 07/18/2019   Procedure: CARDIOVERSION;  Surgeon: Lars Masson, MD;  Location: [facility] ENDOSCOPY;  Service: Cardiovascular;  Laterality: N/A;   TEE WITHOUT CARDIOVERSION N/A 07/18/2019   Procedure: TRANSESOPHAGEAL ECHOCARDIOGRAM (TEE);  Surgeon: Lars Masson, MD;  Location: [facility] ENDOSCOPY;  Service: Cardiovascular;  Laterality: N/A;   TOOTH EXTRACTION     XI ROBOTIC ASSISTED SIMPLE PROSTATECTOMY N/A 05/31/2020   Procedure: XI ROBOTIC ASSISTED SIMPLE PROSTATECTOMY-SUPRAPUBIC;  Surgeon: Malen Gauze, MD;  Location: [facility] ORS;  Service: Urology;  Laterality: N/A;   Social History   Occupational History   Occupation: mfg  Tobacco Use   Smoking status: Never    Passive exposure: Yes   Smokeless tobacco: Never  Vaping Use   Vaping status: Never Used  Substance and Sexual Activity   Alcohol use: No   Drug use: No   Sexual activity: Yes

## 2023-02-10 NOTE — Assessment & Plan Note (Signed)
Checking renal function panel as this has not been rechecked since leaving hospital 1 month ago. BP is elevated today and he states not running high at home. He knows that high BP will cause worsening renal function more quickly.

## 2023-02-10 NOTE — Assessment & Plan Note (Signed)
It is unclear if this is related to positive rheumatoid factor. This could be. He is unable to take nsaids and with poorly controlled diabetes at this time I am not comfortable to start steroids. Steroid injection helped some. Referral to rheumatology done by ortho they cannot see him until next year and they wish sooner assessment. Checking anti ccp marker today to see if this is positive.

## 2023-02-11 ENCOUNTER — Ambulatory Visit: Payer: BC Managed Care – PPO

## 2023-02-11 ENCOUNTER — Other Ambulatory Visit: Payer: Self-pay | Admitting: Physician Assistant

## 2023-02-11 LAB — CYCLIC CITRUL PEPTIDE ANTIBODY, IGG: Cyclic Citrullin Peptide Ab: <16 U

## 2023-02-11 NOTE — Patient Instructions (Signed)
DUE TO COVID-19 ONLY TWO VISITORS  (aged 65 and older)  ARE ALLOWED TO COME WITH YOU AND STAY IN THE WAITING ROOM ONLY DURING PRE OP AND PROCEDURE.   **NO VISITORS ARE ALLOWED IN THE SHORT STAY AREA OR RECOVERY ROOM!!**  IF YOU WILL BE ADMITTED INTO THE HOSPITAL YOU ARE ALLOWED ONLY FOUR SUPPORT PEOPLE DURING VISITATION HOURS ONLY (7 AM -8PM)   The support person(s) must pass our screening, gel in and out, and wear a mask at all times, including in the patient's room. Patients must also wear a mask when staff or their support person are in the room. Visitors GUEST BADGE MUST BE WORN VISIBLY  One adult visitor may remain with you overnight and MUST be in the room by 8 P.M.     Your procedure is scheduled on: 02/18/23   Report to [facility] Main Entrance    Report to admitting at : 11:45 AM   Call this number if you have problems the morning of surgery [phone]   Do not eat food :After Midnight.   After Midnight you may have the following liquids until : 9:00 AM DAY OF SURGERY  Water Black Coffee (sugar ok, NO MILK/CREAM OR CREAMERS)  Tea (sugar ok, NO MILK/CREAM OR CREAMERS) regular and decaf                             Plain Jell-O (NO RED)                                           Fruit ices (not with fruit pulp, NO RED)                                     Popsicles (NO RED)                                                                  Juice: apple, WHITE grape, WHITE cranberry Sports drinks like Gatorade (NO RED)   The day of surgery:  Drink ONE (1) Pre-Surgery Clear G2 at : 9:00 AM the morning of surgery. Drink in one sitting. Do not sip.  This drink was given to you during your hospital  pre-op appointment visit. Nothing else to drink after completing the  Pre-Surgery Clear Ensure or G2.          If you have questions, please contact your surgeon's office.  FOLLOW  ANY ADDITIONAL PRE OP INSTRUCTIONS YOU RECEIVED FROM YOUR SURGEON'S OFFICE!!!   Oral  Hygiene is also important to reduce your risk of infection.                                    Remember - BRUSH YOUR TEETH THE MORNING OF SURGERY WITH YOUR REGULAR TOOTHPASTE  DENTURES WILL BE REMOVED PRIOR TO SURGERY PLEASE DO NOT APPLY "Poly grip" OR ADHESIVES!!!   Do NOT smoke after Midnight   Take these medicines the morning of  surgery with A SIP OF WATER: isosorbide,amiodarone,hydralazine,carvedilol,finasteride. How to Manage Your Diabetes Before and After Surgery  Why is it important to control my blood sugar before and after surgery? Improving blood sugar levels before and after surgery helps healing and can limit problems. A way of improving blood sugar control is eating a healthy diet by:  Eating less sugar and carbohydrates  Increasing activity/exercise  Talking with your doctor about reaching your blood sugar goals High blood sugars (greater than 180 mg/dL) can raise your risk of infections and slow your recovery, so you will need to focus on controlling your diabetes during the weeks before surgery. Make sure that the doctor who takes care of your diabetes knows about your planned surgery including the date and location.  How do I manage my blood sugar before surgery? Check your blood sugar at least 4 times a day, starting 2 days before surgery, to make sure that the level is not too high or low. Check your blood sugar the morning of your surgery when you wake up and every 2 hours until you get to the Short Stay unit. If your blood sugar is less than 70 mg/dL, you will need to treat for low blood sugar: Do not take insulin. Treat a low blood sugar (less than 70 mg/dL) with  cup of clear juice (cranberry or apple), 4 glucose tablets, OR glucose gel. Recheck blood sugar in 15 minutes after treatment (to make sure it is greater than 70 mg/dL). If your blood sugar is not greater than 70 mg/dL on recheck, call [phone] for further instructions. Report your blood sugar to the  short stay nurse when you get to Short Stay.  If you are admitted to the hospital after surgery: Your blood sugar will be checked by the staff and you will probably be given insulin after surgery (instead of oral diabetes medicines) to make sure you have good blood sugar levels. The goal for blood sugar control after surgery is 80-180 mg/dL.  WHAT DO I DO ABOUT MY DIABETES MEDICATION?  THE MORNING OF SURGERY, DO NOT TAKE ANY ORAL DIABETIC MEDICATIONS DAY OF YOUR SURGERY                              You may not have any metal on your body including hair pins, jewelry, and body piercing             Do not wear lotions, powders, perfumes/cologne, or deodorant              Men may shave face and neck.   Do not bring valuables to the hospital.  IS NOT             RESPONSIBLE   FOR VALUABLES.   Contacts, glasses, or bridgework may not be worn into surgery.   Bring small overnight bag day of surgery.   DO NOT BRING YOUR HOME MEDICATIONS TO THE HOSPITAL. PHARMACY WILL DISPENSE MEDICATIONS LISTED ON YOUR MEDICATION LIST TO YOU DURING YOUR ADMISSION IN THE HOSPITAL!    Patients discharged on the day of surgery will not be allowed to drive home.  Someone NEEDS to stay with you for the first 24 hours after anesthesia.   Special Instructions: Bring a copy of your healthcare power of attorney and living will documents         the day of surgery if you haven't scanned them before.  Please read over the following fact sheets you were given: IF YOU HAVE QUESTIONS ABOUT YOUR PRE-OP INSTRUCTIONS PLEASE CALL [phone]    [facility] - Preparing for Surgery Before surgery, you can play an important role.  Because skin is not sterile, your skin needs to be as free of germs as possible.  You can reduce the number of germs on your skin by washing with CHG (chlorahexidine gluconate) soap before surgery.  CHG is an antiseptic cleaner which kills germs and bonds with the skin to  continue killing germs even after washing. Please DO NOT use if you have an allergy to CHG or antibacterial soaps.  If your skin becomes reddened/irritated stop using the CHG and inform your nurse when you arrive at Short Stay. Do not shave (including legs and underarms) for at least 48 hours prior to the first CHG shower.  You may shave your face/neck. Please follow these instructions carefully:  1.  Shower with CHG Soap the night before surgery and the  morning of Surgery.  2.  If you choose to wash your hair, wash your hair first as usual with your  normal  shampoo.  3.  After you shampoo, rinse your hair and body thoroughly to remove the  shampoo.                           4.  Use CHG as you would any other liquid soap.  You can apply chg directly  to the skin and wash                       Gently with a scrungie or clean washcloth.  5.  Apply the CHG Soap to your body ONLY FROM THE NECK DOWN.   Do not use on face/ open                           Wound or open sores. Avoid contact with eyes, ears mouth and genitals (private parts).                       Wash face,  Genitals (private parts) with your normal soap.             6.  Wash thoroughly, paying special attention to the area where your surgery  will be performed.  7.  Thoroughly rinse your body with warm water from the neck down.  8.  DO NOT shower/wash with your normal soap after using and rinsing off  the CHG Soap.                9.  Pat yourself dry with a clean towel.            10.  Wear clean pajamas.            11.  Place clean sheets on your bed the night of your first shower and do not  sleep with pets. Day of Surgery : Do not apply any lotions/deodorants the morning of surgery.  Please wear clean clothes to the hospital/surgery center.  FAILURE TO FOLLOW THESE INSTRUCTIONS MAY RESULT IN THE CANCELLATION OF YOUR SURGERY PATIENT SIGNATURE_________________________________  NURSE  SIGNATURE__________________________________  ________________________________________________________________________  Johnny [name]  An incentive spirometer is a tool that can help keep your lungs clear and active. This tool measures how well you are filling your lungs with each breath. Taking  long deep breaths may help reverse or decrease the chance of developing breathing (pulmonary) problems (especially infection) following: A long period of time when you are unable to move or be active. BEFORE THE PROCEDURE  If the spirometer includes an indicator to show your best effort, your nurse or respiratory therapist will set it to a desired goal. If possible, sit up straight or lean slightly forward. Try not to slouch. Hold the incentive spirometer in an upright position. INSTRUCTIONS FOR USE  Sit on the edge of your bed if possible, or sit up as far as you can in bed or on a chair. Hold the incentive spirometer in an upright position. Breathe out normally. Place the mouthpiece in your mouth and seal your lips tightly around it. Breathe in slowly and as deeply as possible, raising the piston or the ball toward the top of the column. Hold your breath for 3-5 seconds or for as long as possible. Allow the piston or ball to fall to the bottom of the column. Remove the mouthpiece from your mouth and breathe out normally. Rest for a few seconds and repeat Steps 1 through 7 at least 10 times every 1-2 hours when you are awake. Take your time and take a few normal breaths between deep breaths. The spirometer may include an indicator to show your best effort. Use the indicator as a goal to work toward during each repetition. After each set of 10 deep breaths, practice coughing to be sure your lungs are clear. If you have an incision (the cut made at the time of surgery), support your incision when coughing by placing a pillow or rolled up towels firmly against it. Once you are able to get out of  bed, walk around indoors and cough well. You may stop using the incentive spirometer when instructed by your caregiver.  RISKS AND COMPLICATIONS Take your time so you do not get dizzy or light-headed. If you are in pain, you may need to take or ask for pain medication before doing incentive spirometry. It is harder to take a deep breath if you are having pain. AFTER USE Rest and breathe slowly and easily. It can be helpful to keep track of a log of your progress. Your caregiver can provide you with a simple table to help with this. If you are using the spirometer at home, follow these instructions: SEEK MEDICAL CARE IF:  You are having difficultly using the spirometer. You have trouble using the spirometer as often as instructed. Your pain medication is not giving enough relief while using the spirometer. You develop fever of 100.5 F (38.1 C) or higher. SEEK IMMEDIATE MEDICAL CARE IF:  You cough up bloody sputum that had not been present before. You develop fever of 102 F (38.9 C) or greater. You develop worsening pain at or near the incision site. MAKE SURE YOU:  Understand these instructions. Will watch your condition. Will get help right away if you are not doing well or get worse. Document Released: 09/01/2006 Document Revised: 07/14/2011 Document Reviewed: 11/02/2006 [facility] Patient Information 2014 [place], [place].   ________________________________________________________________________

## 2023-02-12 ENCOUNTER — Other Ambulatory Visit: Payer: Self-pay

## 2023-02-12 ENCOUNTER — Encounter (HOSPITAL_COMMUNITY): Payer: Self-pay

## 2023-02-12 ENCOUNTER — Encounter (HOSPITAL_COMMUNITY)
Admission: RE | Admit: 2023-02-12 | Discharge: 2023-02-12 | Disposition: A | Discharge status: Home / Self Care | Payer: BC Managed Care – PPO | Source: Ambulatory Visit | Attending: Orthopaedic Surgery | Admitting: Orthopaedic Surgery

## 2023-02-12 DIAGNOSIS — Z01812 Encounter for preprocedural laboratory examination: Secondary | ICD-10-CM | POA: Diagnosis present

## 2023-02-12 DIAGNOSIS — G4730 Sleep apnea, unspecified: Secondary | ICD-10-CM | POA: Insufficient documentation

## 2023-02-12 DIAGNOSIS — I13 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 1 through stage 4 chronic kidney disease, or unspecified chronic kidney disease: Secondary | ICD-10-CM | POA: Diagnosis not present

## 2023-02-12 DIAGNOSIS — E118 Type 2 diabetes mellitus with unspecified complications: Secondary | ICD-10-CM | POA: Diagnosis not present

## 2023-02-12 DIAGNOSIS — I5022 Chronic systolic (congestive) heart failure: Secondary | ICD-10-CM | POA: Diagnosis not present

## 2023-02-12 DIAGNOSIS — E785 Hyperlipidemia, unspecified: Secondary | ICD-10-CM | POA: Insufficient documentation

## 2023-02-12 DIAGNOSIS — N184 Chronic kidney disease, stage 4 (severe): Secondary | ICD-10-CM | POA: Insufficient documentation

## 2023-02-12 DIAGNOSIS — E1122 Type 2 diabetes mellitus with diabetic chronic kidney disease: Secondary | ICD-10-CM | POA: Diagnosis not present

## 2023-02-12 HISTORY — DX: Unspecified osteoarthritis, unspecified site: M19.90

## 2023-02-12 LAB — GLUCOSE, CAPILLARY: Glucose-Capillary: 128 mg/dL — ABNORMAL HIGH (ref 70–99)

## 2023-02-12 NOTE — Progress Notes (Signed)
For Short Stay: COVID SWAB appointment date:  Bowel Prep reminder:   For Anesthesia: PCP - Myrlene Broker, MD  Cardiologist - Quintella Reichert, MD   Chest x-ray -  EKG - 01/20/23 Stress Test -  ECHO - 08/19/19 Cardioversion: 01/05/23 Cardiac Cath -  Pacemaker/ICD device last checked: Pacemaker orders received: Device Rep notified:  Spinal Cord Stimulator:  Sleep Study - Yes CPAP - Yes  Fasting Blood Sugar - 100's Checks Blood Sugar ___3__ times a day Date and result of last Hgb A1c- 9.8: 12/01/22  Last dose of GLP1 agonist- N/A GLP1 instructions:   Last dose of SGLT-2 inhibitors- N/A SGLT-2 instructions:   Blood Thinner Instructions: Eliquis will be on hold after: 02/14/23 Aspirin Instructions: Last Dose:  Activity level: Can go up a flight of stairs and activities of daily living without stopping and without chest pain and/or shortness of breath   Able to exercise without chest pain and/or shortness of breath  Anesthesia review: Hx: CKD IV,DIA,HTN,A flutter,OSA(CPAP).  Patient denies shortness of breath, fever, cough and chest pain at PAT appointment   Patient verbalized understanding of instructions that were given to them at the PAT appointment. Patient was also instructed that they will need to review over the PAT instructions again at home before surgery.

## 2023-02-13 ENCOUNTER — Telehealth: Payer: Self-pay

## 2023-02-13 NOTE — Progress Notes (Signed)
Anesthesia Chart Review   Case: [id number] Date/Time: 02/18/23 1145   Procedure: RIGHT KNEE ARTHROSCOPIC IRRIGATION AND DEBRIDEMENT (Right: Knee)   Anesthesia type: General   Pre-op diagnosis: right knee septic arthritis vs psoriatic arthritis   Location: [facility] ROOM 08 / [facility]   Surgeons: Tarry Kos, MD       DISCUSSION:67 y.o. 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 Gershon Mussel.   Evaluated by EP 02/02/2023.  Per notes will hold off on ablation until after knee surgery as ablation would require 3 months of uninterrupted anticoagulation.  Will remain on amiodarone.    VS: BP (!) 157/93   Pulse 84   Temp 37.3 C (Oral)   Ht 6\' 5"  (1.956 m)   Wt 129 kg   SpO2 99%   BMI 33.72 kg/m   PROVIDERS: Myrlene Broker, MD is PCP   Cardiologist - Quintella Reichert, MD    LABS: Labs reviewed: Acceptable for surgery. (all labs ordered are listed, but only abnormal results are displayed)  Labs Reviewed  GLUCOSE, CAPILLARY - Abnormal; Notable for the following components:      Result Value   Glucose-Capillary 128 (*)    All other components within normal limits     IMAGES:   EKG:   CV: Echo 08/19/2019 1. Left ventricular ejection fraction, by estimation, is 60 to 65%. The  left ventricle has normal function. The left ventricle has no regional  wall motion abnormalities. There is mild concentric left ventricular  hypertrophy. Left ventricular diastolic  parameters are indeterminate.   2. Right ventricular systolic function is normal. The right ventricular  size is mildly enlarged. There is normal pulmonary artery systolic  pressure.   3. Left atrial size was moderately dilated.   4. Right atrial size was moderately dilated.   5. The  mitral valve is normal in structure. No evidence of mitral valve  regurgitation. No evidence of mitral stenosis.   6. The aortic valve is normal in structure. Aortic valve regurgitation is  not visualized. No aortic stenosis is present.   7. Aortic dilatation noted. There is mild dilatation at the level of the  sinuses of Valsalva measuring 40 mm.   8. The inferior vena cava is normal in size with greater than 50%  respiratory variability, suggesting right atrial pressure of 3 mmHg.  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   Qualifier: Diagnosis of  By: Debby Bud MD, Rosalyn Gess    Sleep apnea    Type II diabetes mellitus with renal manifestations, uncontrolled 08/03/2007   Qualifier: Diagnosis of  By: Debby Bud MD, Rosalyn Gess  Med metformin  Past Surgical History:  Procedure Laterality Date   CARDIOVERSION N/A 07/18/2019   Procedure: CARDIOVERSION;  Surgeon: Lars Masson, MD;  Location: [facility] ENDOSCOPY;  Service: Cardiovascular;  Laterality: N/A;   TEE WITHOUT CARDIOVERSION N/A 07/18/2019   Procedure: TRANSESOPHAGEAL ECHOCARDIOGRAM (TEE);  Surgeon: Lars Masson, MD;  Location: [facility] ENDOSCOPY;  Service: Cardiovascular;  Laterality: N/A;   TOOTH EXTRACTION     XI ROBOTIC ASSISTED SIMPLE PROSTATECTOMY N/A 05/31/2020   Procedure: XI ROBOTIC ASSISTED SIMPLE PROSTATECTOMY-SUPRAPUBIC;  Surgeon: Malen Gauze, MD;  Location[facility];  Service: Urology;  Laterality: N/A;     MEDICATIONS:  amiodarone (PACERONE) 200 MG tablet   carvedilol (COREG) 25 MG tablet   diclofenac Sodium (VOLTAREN) 1 % GEL   ELIQUIS 5 MG TABS tablet   finasteride (PROSCAR) 5 MG tablet   glipiZIDE (GLUCOTROL) 5 MG tablet   hydrALAZINE (APRESOLINE) 50 MG tablet   HYDROcodone-acetaminophen (NORCO/VICODIN) 5-325 MG tablet   HYDROcodone-acetaminophen (NORCO/VICODIN) 5-325 MG tablet   isosorbide mononitrate (IMDUR) 60 MG 24 hr tablet   No current facility-administered medications for this encounter.     Jodell Cipro Ward, PA-C [facility]-Surgical Testing [phone]

## 2023-02-13 NOTE — Telephone Encounter (Signed)
Pt has stated he was contacted by Ortho and set yp to so a procedure to clean out the area in his knee and check for infection as Dr. Roda Shutters suspects pt has an infection in the knee.

## 2023-02-18 ENCOUNTER — Ambulatory Visit (HOSPITAL_COMMUNITY): Payer: BC Managed Care – PPO | Admitting: Anesthesiology

## 2023-02-18 ENCOUNTER — Encounter (HOSPITAL_COMMUNITY): Payer: Self-pay | Admitting: Orthopaedic Surgery

## 2023-02-18 ENCOUNTER — Other Ambulatory Visit: Payer: Self-pay

## 2023-02-18 ENCOUNTER — Encounter (HOSPITAL_COMMUNITY)
Admission: RE | Disposition: A | Discharge status: Home / Self Care | Payer: Self-pay | Source: Ambulatory Visit | Attending: Orthopaedic Surgery

## 2023-02-18 ENCOUNTER — Other Ambulatory Visit (HOSPITAL_COMMUNITY): Payer: Self-pay

## 2023-02-18 ENCOUNTER — Ambulatory Visit (HOSPITAL_COMMUNITY): Payer: BC Managed Care – PPO | Admitting: Physician Assistant

## 2023-02-18 ENCOUNTER — Encounter: Payer: Self-pay | Admitting: Orthopaedic Surgery

## 2023-02-18 ENCOUNTER — Ambulatory Visit (HOSPITAL_COMMUNITY)
Admission: RE | Admit: 2023-02-18 | Discharge: 2023-02-18 | Disposition: A | Discharge status: Home / Self Care | Payer: BC Managed Care – PPO | Source: Ambulatory Visit | Attending: Orthopaedic Surgery | Admitting: Orthopaedic Surgery

## 2023-02-18 DIAGNOSIS — E118 Type 2 diabetes mellitus with unspecified complications: Secondary | ICD-10-CM

## 2023-02-18 DIAGNOSIS — I4891 Unspecified atrial fibrillation: Secondary | ICD-10-CM | POA: Diagnosis not present

## 2023-02-18 DIAGNOSIS — E119 Type 2 diabetes mellitus without complications: Secondary | ICD-10-CM | POA: Diagnosis not present

## 2023-02-18 DIAGNOSIS — Z7984 Long term (current) use of oral hypoglycemic drugs: Secondary | ICD-10-CM | POA: Diagnosis not present

## 2023-02-18 DIAGNOSIS — Z79899 Other long term (current) drug therapy: Secondary | ICD-10-CM | POA: Diagnosis not present

## 2023-02-18 DIAGNOSIS — Z7901 Long term (current) use of anticoagulants: Secondary | ICD-10-CM | POA: Insufficient documentation

## 2023-02-18 DIAGNOSIS — G4730 Sleep apnea, unspecified: Secondary | ICD-10-CM | POA: Diagnosis not present

## 2023-02-18 DIAGNOSIS — I10 Essential (primary) hypertension: Secondary | ICD-10-CM | POA: Insufficient documentation

## 2023-02-18 DIAGNOSIS — M25461 Effusion, right knee: Secondary | ICD-10-CM | POA: Diagnosis present

## 2023-02-18 HISTORY — PX: IRRIGATION AND DEBRIDEMENT KNEE: SHX5185

## 2023-02-18 LAB — GLUCOSE, CAPILLARY
Glucose-Capillary: 133 mg/dL — ABNORMAL HIGH (ref 70–99)
Glucose-Capillary: 129 mg/dL — ABNORMAL HIGH (ref 70–99)
Glucose-Capillary: 112 mg/dL — ABNORMAL HIGH (ref 70–99)

## 2023-02-18 SURGERY — IRRIGATION AND DEBRIDEMENT KNEE
Anesthesia: General | Site: Knee | Laterality: Right

## 2023-02-18 MED ORDER — PROPOFOL 10 MG/ML IV BOLUS
INTRAVENOUS | Status: DC | PRN
  Administered 2023-02-18: 150 mg via INTRAVENOUS
  Administered 2023-02-18: 50 mg via INTRAVENOUS

## 2023-02-18 MED ORDER — LACTATED RINGERS IV SOLN: INTRAVENOUS | Status: DC

## 2023-02-18 MED ORDER — HYDROMORPHONE HCL 1 MG/ML IJ SOLN
0.2500 mg | INTRAMUSCULAR | Status: DC | PRN
  Administered 2023-02-18 (×3): 0.5 mg via INTRAVENOUS

## 2023-02-18 MED ORDER — MIDAZOLAM HCL 5 MG/5ML IJ SOLN
INTRAMUSCULAR | Status: DC | PRN
Start: 2023-02-18 — End: 2023-02-18
  Administered 2023-02-18: 2 mg via INTRAVENOUS

## 2023-02-18 MED ORDER — CEFAZOLIN SODIUM 1 G IJ SOLR
INTRAMUSCULAR | Status: AC
  Filled 2023-02-18: qty 10

## 2023-02-18 MED ORDER — MIDAZOLAM HCL 2 MG/2ML IJ SOLN: 0.5000 mg | Freq: Once | INTRAMUSCULAR | Status: DC | PRN

## 2023-02-18 MED ORDER — EPHEDRINE SULFATE-NACL 50-0.9 MG/10ML-% IV SOSY
PREFILLED_SYRINGE | INTRAVENOUS | Status: DC | PRN
Start: 2023-02-18 — End: 2023-02-18
  Administered 2023-02-18: 5 mg via INTRAVENOUS

## 2023-02-18 MED ORDER — LABETALOL HCL 5 MG/ML IV SOLN
5.0000 mg | INTRAVENOUS | Status: AC | PRN
  Administered 2023-02-18 (×4): 5 mg via INTRAVENOUS

## 2023-02-18 MED ORDER — OXYCODONE HCL 5 MG/5ML PO SOLN: 5.0000 mg | Freq: Once | ORAL | Status: AC | PRN

## 2023-02-18 MED ORDER — OXYCODONE HCL 5 MG PO TABS
5.0000 mg | ORAL_TABLET | Freq: Once | ORAL | Status: AC | PRN
  Administered 2023-02-18: 5 mg via ORAL

## 2023-02-18 MED ORDER — ACETAMINOPHEN 500 MG PO TABS
1000.0000 mg | ORAL_TABLET | Freq: Once | ORAL | Status: AC
  Administered 2023-02-18: 1000 mg via ORAL
  Filled 2023-02-18: qty 2

## 2023-02-18 MED ORDER — DEXTROSE 5 % IV SOLN
INTRAVENOUS | Status: DC | PRN
  Administered 2023-02-18: 3 g via INTRAVENOUS

## 2023-02-18 MED ORDER — BUPIVACAINE HCL (PF) 0.25 % IJ SOLN
INTRAMUSCULAR | Status: AC
  Filled 2023-02-18: qty 30

## 2023-02-18 MED ORDER — HYDROMORPHONE HCL 1 MG/ML IJ SOLN
INTRAMUSCULAR | Status: AC
  Administered 2023-02-18: 0.5 mg via INTRAVENOUS
  Filled 2023-02-18: qty 2

## 2023-02-18 MED ORDER — ORAL CARE MOUTH RINSE: 15.0000 mL | Freq: Once | OROMUCOSAL | Status: AC

## 2023-02-18 MED ORDER — MEPERIDINE HCL 50 MG/ML IJ SOLN: 6.2500 mg | INTRAMUSCULAR | Status: DC | PRN

## 2023-02-18 MED ORDER — BUPIVACAINE HCL (PF) 0.25 % IJ SOLN
INTRAMUSCULAR | Status: DC | PRN
  Administered 2023-02-18: 20 mL

## 2023-02-18 MED ORDER — GLYCOPYRROLATE 0.2 MG/ML IJ SOLN
INTRAMUSCULAR | Status: DC | PRN
Start: 2023-02-18 — End: 2023-02-18
  Administered 2023-02-18 (×2): .1 mg via INTRAVENOUS

## 2023-02-18 MED ORDER — MIDAZOLAM HCL 2 MG/2ML IJ SOLN
INTRAMUSCULAR | Status: AC
  Filled 2023-02-18: qty 2

## 2023-02-18 MED ORDER — OXYCODONE HCL 5 MG PO TABS
ORAL_TABLET | ORAL | Status: AC
  Filled 2023-02-18: qty 1

## 2023-02-18 MED ORDER — INSULIN ASPART 100 UNIT/ML IJ SOLN: 0.0000 [IU] | INTRAMUSCULAR | Status: DC | PRN

## 2023-02-18 MED ORDER — FENTANYL CITRATE (PF) 100 MCG/2ML IJ SOLN
INTRAMUSCULAR | Status: DC | PRN
  Administered 2023-02-18 (×2): 25 ug via INTRAVENOUS
  Administered 2023-02-18: 50 ug via INTRAVENOUS
  Administered 2023-02-18: 100 ug via INTRAVENOUS

## 2023-02-18 MED ORDER — PHENYLEPHRINE HCL (PRESSORS) 10 MG/ML IV SOLN
INTRAVENOUS | Status: DC | PRN
Start: 2023-02-18 — End: 2023-02-18
  Administered 2023-02-18: 160 ug via INTRAVENOUS

## 2023-02-18 MED ORDER — LABETALOL HCL 5 MG/ML IV SOLN
INTRAVENOUS | Status: AC
  Filled 2023-02-18: qty 4

## 2023-02-18 MED ORDER — LIDOCAINE HCL (CARDIAC) PF 100 MG/5ML IV SOSY
PREFILLED_SYRINGE | INTRAVENOUS | Status: DC | PRN
Start: 2023-02-18 — End: 2023-02-18
  Administered 2023-02-18: 60 mg via INTRATRACHEAL

## 2023-02-18 MED ORDER — OXYCODONE-ACETAMINOPHEN 5-325 MG PO TABS
1.0000 | ORAL_TABLET | Freq: Two times a day (BID) | ORAL | 0 refills | Status: DC | PRN
Start: 2023-02-18 — End: 2023-04-22
  Filled 2023-02-18: qty 30, 8d supply, fill #0

## 2023-02-18 MED ORDER — FENTANYL CITRATE (PF) 100 MCG/2ML IJ SOLN
INTRAMUSCULAR | Status: AC
  Filled 2023-02-18: qty 2

## 2023-02-18 MED ORDER — CHLORHEXIDINE GLUCONATE 0.12 % MT SOLN
15.0000 mL | Freq: Once | OROMUCOSAL | Status: AC
  Administered 2023-02-18: 15 mL via OROMUCOSAL

## 2023-02-18 MED ORDER — SODIUM CHLORIDE 0.9 % IR SOLN
Status: DC | PRN
  Administered 2023-02-18: 3000 mL

## 2023-02-18 MED ORDER — ONDANSETRON HCL 4 MG/2ML IJ SOLN
INTRAMUSCULAR | Status: DC | PRN
  Administered 2023-02-18: 4 mg via INTRAVENOUS

## 2023-02-18 SURGICAL SUPPLY — 47 items
BAG COUNTER SPONGE SURGICOUNT (BAG) ×1 IMPLANT
BAG SPNG CNTER NS LX DISP (BAG) ×1
BANDAGE ESMARK 6X9 LF (GAUZE/BANDAGES/DRESSINGS) ×1 IMPLANT
BLADE CLIPPER SURG (BLADE) IMPLANT
BLADE EXCALIBUR 4.0X13 (MISCELLANEOUS) IMPLANT
BLADE EXTENDED COATED 6.5IN (ELECTRODE) IMPLANT
BLADE SURG SZ11 CARB STEEL (BLADE) IMPLANT
BNDG CMPR 6 X 5 YARDS HK CLSR (GAUZE/BANDAGES/DRESSINGS) ×1
BNDG CMPR 9X6 STRL LF SNTH (GAUZE/BANDAGES/DRESSINGS) ×1
BNDG CMPR MED 10X6 ELC LF (GAUZE/BANDAGES/DRESSINGS) ×1
BNDG ELASTIC 6INX 5YD STR LF (GAUZE/BANDAGES/DRESSINGS) ×1 IMPLANT
BNDG ELASTIC 6X10 VLCR STRL LF (GAUZE/BANDAGES/DRESSINGS) IMPLANT
BNDG ESMARK 6X9 LF (GAUZE/BANDAGES/DRESSINGS) ×1
COVER SURGICAL LIGHT HANDLE (MISCELLANEOUS) ×1 IMPLANT
CUFF TOURN SGL QUICK 34 (TOURNIQUET CUFF) ×1
CUFF TOURN SGL QUICK 42 (TOURNIQUET CUFF) IMPLANT
CUFF TRNQT CYL 34X4.125X (TOURNIQUET CUFF) IMPLANT
DRAPE ARTHROSCOPY W/POUCH 114 (DRAPES) ×1 IMPLANT
DRAPE SURG 17X23 STRL (DRAPES) ×2 IMPLANT
DRAPE U-SHAPE 47X51 STRL (DRAPES) ×1 IMPLANT
DURAPREP 26ML APPLICATOR (WOUND CARE) ×1 IMPLANT
FACESHIELD WRAPAROUND (MASK) IMPLANT
FACESHIELD WRAPAROUND OR TEAM (MASK) ×1 IMPLANT
GAUZE PAD ABD 8X10 STRL (GAUZE/BANDAGES/DRESSINGS) IMPLANT
GAUZE SPONGE 4X4 12PLY STRL (GAUZE/BANDAGES/DRESSINGS) ×1 IMPLANT
GAUZE XEROFORM 1X8 LF (GAUZE/BANDAGES/DRESSINGS) ×1 IMPLANT
GLOVE BIOGEL PI IND STRL 7.0 (GLOVE) ×2 IMPLANT
GLOVE ECLIPSE 7.0 STRL STRAW (GLOVE) ×1 IMPLANT
GLOVE INDICATOR 7.5 STRL GRN (GLOVE) ×1 IMPLANT
GLOVE SURG SYN 7.5 E (GLOVE) ×2 IMPLANT
GLOVE SURG SYN 7.5 PF PI (GLOVE) ×2 IMPLANT
GOWN STRL SURGICAL XL XLNG (GOWN DISPOSABLE) ×2 IMPLANT
KIT TURNOVER KIT B (KITS) ×1 IMPLANT
MANIFOLD NEPTUNE II (INSTRUMENTS) IMPLANT
NS IRRIG 1000ML POUR BTL (IV SOLUTION) IMPLANT
PACK ARTHROSCOPY DSU (CUSTOM PROCEDURE TRAY) ×1 IMPLANT
PAD ARMBOARD 7.5X6 YLW CONV (MISCELLANEOUS) ×2 IMPLANT
PADDING CAST COTTON 6X4 STRL (CAST SUPPLIES) ×1 IMPLANT
SPONGE T-LAP 4X18 ~~LOC~~+RFID (SPONGE) ×1 IMPLANT
SUT ETHILON 3 0 PS 1 (SUTURE) ×1 IMPLANT
SWAB COLLECTION DEVICE MRSA (MISCELLANEOUS) IMPLANT
SWAB CULTURE ESWAB REG 1ML (MISCELLANEOUS) IMPLANT
TOWEL GREEN STERILE (TOWEL DISPOSABLE) ×1 IMPLANT
TOWEL GREEN STERILE FF (TOWEL DISPOSABLE) ×1 IMPLANT
TOWEL OR 17X26 10 PK STRL BLUE (TOWEL DISPOSABLE) IMPLANT
TUBING ARTHROSCOPY IRRIG 16FT (MISCELLANEOUS) ×1 IMPLANT
WATER STERILE IRR 1000ML POUR (IV SOLUTION) ×1 IMPLANT

## 2023-02-18 NOTE — H&P (Signed)
PREOPERATIVE H&P  Chief Complaint: right knee septic arthritis vs psoriatic arthritis  HPI: Johnny [name] is a 67 y.o. male who presents for surgical treatment of right knee septic arthritis vs psoriatic arthritis.  He denies any changes in medical history.  Past Surgical History:  Procedure Laterality Date   CARDIOVERSION N/A 07/18/2019   Procedure: CARDIOVERSION;  Surgeon: Lars Masson, MD;  Location: [facility] ENDOSCOPY;  Service: Cardiovascular;  Laterality: N/A;   TEE WITHOUT CARDIOVERSION N/A 07/18/2019   Procedure: TRANSESOPHAGEAL ECHOCARDIOGRAM (TEE);  Surgeon: Lars Masson, MD;  Location: [facility] ENDOSCOPY;  Service: Cardiovascular;  Laterality: N/A;   TOOTH EXTRACTION     XI ROBOTIC ASSISTED SIMPLE PROSTATECTOMY N/A 05/31/2020   Procedure: XI ROBOTIC ASSISTED SIMPLE PROSTATECTOMY-SUPRAPUBIC;  Surgeon: Malen Gauze, MD;  Location[facility];  Service: Urology;  Laterality: N/A;   Social History   Socioeconomic History   Marital status: Married    Spouse name: Not on file   Number of children: 2   Years of education: 12   Highest education level: Not on file  Occupational History   Occupation: mfg  Tobacco Use   Smoking status: Never    Passive exposure: Yes   Smokeless tobacco: Never  Vaping Use   Vaping status: Never Used  Substance and Sexual Activity   Alcohol use: No   Drug use: No   Sexual activity: Yes  Other Topics Concern   Not on file  Social History Narrative   [employer]. [employer]. [employer] x 4 years. Married '94. 2 sons - '95, '96. Wife is healthy. Work - Training and development officer.   Social Determinants of Health   Financial Resource Strain: Not on file  Food Insecurity: No Food Insecurity (01/20/2023)   Hunger Vital Sign    Worried About Running Out of Food in the Last Year: Never true    Ran Out of Food in the Last Year: Never true  Transportation Needs: No Transportation Needs (01/20/2023)   PRAPARE - Administrator, Civil Service (Medical): No     Lack of Transportation (Non-Medical): No  Physical Activity: Not on file  Stress: Not on file  Social Connections: Not on file   Family History  Problem Relation Age of Onset   Memory loss Mother        DOB 83   Kidney disease Mother        s/p nephrectomy, infection problem   Dementia Mother    Hypertension Father        DOB [phone]   Hyperlipidemia Father    Diabetes Father    Coronary artery disease Father        with stents   Nephrolithiasis Father    Kidney disease Father        s/p rejected kidney transplant; on HD   Cancer Brother        lung   HIV Brother    Prostate cancer Neg Hx    Colon cancer Neg Hx    No Known Allergies Prior to Admission medications   Medication Sig Start Date End Date Taking? Authorizing Provider  carvedilol (COREG) 25 MG tablet Take 1 tablet (25 mg total) by mouth 2 (two) times daily with a meal. 01/10/22 02/11/23 Yes Myrlene Broker, MD  diclofenac Sodium (VOLTAREN) 1 % GEL Apply 2 g topically 4 (four) times daily. Patient taking differently: Apply 2 g topically 4 (four) times daily as needed (pain). 01/22/23  Yes Glade Lloyd, MD  ELIQUIS 5  MG TABS tablet TAKE 1 TABLET BY MOUTH TWICE A DAY 09/15/22  Yes Turner, Traci R, MD  finasteride (PROSCAR) 5 MG tablet TAKE 1 TABLET (5 MG TOTAL) BY MOUTH DAILY. 06/24/22  Yes McKenzie, Mardene Celeste, MD  glipiZIDE (GLUCOTROL) 5 MG tablet Take 1 tablet (5 mg total) by mouth 2 (two) times daily before a meal. 12/01/22  Yes Shamleffer, Konrad Dolores, MD  hydrALAZINE (APRESOLINE) 50 MG tablet Take 1 tablet (50 mg total) by mouth 3 (three) times daily. 01/22/23  Yes Glade Lloyd, MD  HYDROcodone-acetaminophen (NORCO/VICODIN) 5-325 MG tablet Take 1 tablet by mouth every 4 (four) hours as needed for moderate pain. 01/29/23 01/29/24 Yes Persons, [place], [place]  isosorbide mononitrate (IMDUR) 60 MG 24 hr tablet TAKE 1 TABLET BY MOUTH EVERY DAY 12/01/22  Yes Myrlene Broker, MD  amiodarone (PACERONE) 200 MG tablet  Take 1 tablet (200 mg total) by mouth 2 (two) times daily for 7 days, THEN 1 tablet (200 mg total) daily. 02/02/23 02/04/24  Nobie Putnam, MD  HYDROcodone-acetaminophen (NORCO/VICODIN) 5-325 MG tablet Take 1 tablet by mouth every 4 (four) hours as needed for moderate pain. Patient not taking: Reported on 02/11/2023 01/12/23 01/12/24  Persons, [place], [place]     Positive ROS: All other systems have been reviewed and were otherwise negative with the exception of those mentioned in the HPI and as above.  Physical Exam: General: Alert, no acute distress Cardiovascular: No pedal edema Respiratory: No cyanosis, no use of accessory musculature GI: abdomen soft Skin: No lesions in the area of chief complaint Neurologic: Sensation intact distally Psychiatric: Patient is competent for consent with normal mood and affect Lymphatic: no lymphedema  MUSCULOSKELETAL: exam stable  Assessment: right knee septic arthritis vs psoriatic arthritis  Plan: Plan for Procedure(s): RIGHT KNEE ARTHROSCOPIC IRRIGATION AND DEBRIDEMENT  The risks benefits and alternatives were discussed with the patient including but not limited to the risks of nonoperative treatment, versus surgical intervention including infection, bleeding, nerve injury,  blood clots, cardiopulmonary complications, morbidity, mortality, among others, and they were willing to proceed.   Glee Arvin, MD 02/18/2023 9:27 AM

## 2023-02-18 NOTE — Anesthesia Postprocedure Evaluation (Signed)
Anesthesia Post Note  Patient: Johnny [name]  Procedure(s) Performed: RIGHT KNEE ARTHROSCOPIC IRRIGATION AND DEBRIDEMENT (Right: Knee)     Patient location during evaluation: PACU Anesthesia Type: General Level of consciousness: awake and alert, patient cooperative and oriented Pain management: pain level controlled Vital Signs Assessment: post-procedure vital signs reviewed and stable Respiratory status: spontaneous breathing, nonlabored ventilation and respiratory function stable Cardiovascular status: blood pressure returned to baseline and stable Postop Assessment: no apparent nausea or vomiting Anesthetic complications: no   No notable events documented.  Last Vitals:  Vitals:   02/18/23 1400 02/18/23 1415  BP: (!) 177/103 (!) 169/100  Pulse: 71 69  Resp: 12 11  Temp:    SpO2: 98% 98%    Last Pain:  Vitals:   02/18/23 1415  TempSrc:   PainSc: 4                  Michel Eskelson,E. Malgorzata Albert

## 2023-02-18 NOTE — Transfer of Care (Signed)
Immediate Anesthesia Transfer of Care Note  Patient: Johnny [name]  Procedure(s) Performed: RIGHT KNEE ARTHROSCOPIC IRRIGATION AND DEBRIDEMENT (Right: Knee)  Patient Location: PACU  Anesthesia Type:General  Level of Consciousness: sedated  Airway & Oxygen Therapy: Patient Spontanous Breathing and Patient connected to face mask  Post-op Assessment: Report given to RN  Post vital signs: Reviewed and stable  Last Vitals:  Vitals Value Taken Time  BP 167/105 02/18/23 1302  Temp    Pulse 84 02/18/23 1305  Resp 27 02/18/23 1305  SpO2 97 % 02/18/23 1305  Vitals shown include unfiled device data.  Last Pain:  Vitals:   02/18/23 0943  TempSrc:   PainSc: 0-No pain         Complications: No notable events documented.

## 2023-02-18 NOTE — Anesthesia Preprocedure Evaluation (Addendum)
Anesthesia Evaluation  Patient identified by MRN, date of birth, ID band Patient awake    Reviewed: Allergy & Precautions, NPO status , Patient's Chart, lab work & pertinent test results  History of Anesthesia Complications Negative for: history of anesthetic complications  Airway Mallampati: II  TM Distance: >3 FB Neck ROM: Full    Dental  (+) Dental Advisory Given   Pulmonary sleep apnea and Continuous Positive Airway Pressure Ventilation    breath sounds clear to auscultation       Cardiovascular hypertension, Pt. on medications and Pt. on home beta blockers + dysrhythmias Atrial Fibrillation  Rhythm:Irregular Rate:Normal  '21 ECHO: EF 60 to 65%.  1. The LV has normal function, no regional wall motion abnormalities. There is mild concentric LVH.   2. RVF is normal. The right ventricular size is mildly enlarged. There is normal pulmonary artery systolic pressure.   3. Left atrial size was moderately dilated.   4. Right atrial size was moderately dilated.   5. The mitral valve is normal in structure. No evidence of MR. No evidence of mitral stenosis.   6. The aortic valve is normal in structure. AI is not visualized. No AS is present.   7. Aortic dilatation noted. There is mild dilatation at the level of the sinuses of Valsalva measuring 40 mm.     Neuro/Psych negative neurological ROS     GI/Hepatic negative GI ROS, Neg liver ROS,,,  Endo/Other  diabetes (glu 133), Oral Hypoglycemic Agents    Renal/GU Renal InsufficiencyRenal disease     Musculoskeletal  (+) Arthritis ,    Abdominal   Peds  Hematology Eliquis: last dose saturday   Anesthesia Other Findings   Reproductive/Obstetrics                             Anesthesia Physical Anesthesia Plan  ASA: 3  Anesthesia Plan: General   Post-op Pain Management: Tylenol PO (pre-op)*   Induction: Intravenous  PONV Risk Score and Plan:  2  Airway Management Planned: LMA  Additional Equipment: None  Intra-op Plan:   Post-operative Plan:   Informed Consent: I have reviewed the patients History and Physical, chart, labs and discussed the procedure including the risks, benefits and alternatives for the proposed anesthesia with the patient or authorized representative who has indicated his/her understanding and acceptance.     Dental advisory given  Plan Discussed with: CRNA and Surgeon  Anesthesia Plan Comments:         Anesthesia Quick Evaluation

## 2023-02-18 NOTE — Discharge Instructions (Signed)

## 2023-02-18 NOTE — Op Note (Signed)
   Date of Surgery: 02/18/2023  INDICATIONS: Johnny [name] is a 67 y.o.-year-old male with a right knee effusion possibly septic.  The patient did consent to the procedure after discussion of the risks and benefits.  PREOPERATIVE DIAGNOSIS: Questionable septic arthritis right knee  POSTOPERATIVE DIAGNOSIS: Same.  PROCEDURE: Arthroscopic major synovectomy, chondroplasty of right knee  Debridement type: Excisional Debridement  Side: right  Body Location: Knee   Tools used for debridement: arthroscopic shaver  Irrigation volume: 6 liters     Irrigation fluid type: Normal Saline  SURGEON: N. Glee Arvin, M.D.  ASSIST: None  ANESTHESIA:  general  IV FLUIDS AND URINE: See anesthesia.  ESTIMATED BLOOD LOSS: minimal mL.  IMPLANTS: None  DRAINS: None  COMPLICATIONS: see description of procedure.  DESCRIPTION OF PROCEDURE: The patient was brought to the operating room.  The patient had been signed prior to the procedure and this was documented. The patient had the anesthesia placed by the anesthesiologist.  A time-out was performed to confirm that this was the correct patient, site, side and location.  Antibiotics were held until intraoperative cultures were taken.  A tourniquet was placed on the upper thigh.  The patient had the operative extremity prepped and draped in the standard surgical fashion.    Incisions were made for anteromedial anterolateral arthroscopy portals.  Cultures were taken immediately of the fluid.  Arthroscope was then introduced through the anterolateral portal and the shaver was placed to the anterior medial portal.  There was widespread synovitis throughout the knee joint.  Major synovectomy was performed with an oscillating shaver in both the gutters, medial and lateral compartments, femoral notch, supra patellar compartment.  No frank pus was encountered.  The medial femoral condyle, femoral trochlea and the patellar surfaces were pretty much denuded of all  articular cartilage.  There was a thin layer of cartilage remaining on the lateral femoral condyle.  Both menisci were degeneratively torn.  6 L of normal saline was irrigated through the knee joint.  Excess fluid was expressed from the knee joint.  Local anesthetic was placed in the portal sites in the joint.  Incisions closed with nylon.  Sterile dressings were applied.  Patient tolerated procedure well had no immediate complications.  POSTOPERATIVE PLAN: Patient can be discharged.  We will follow the intraoperative cultures.  Follow-up in 1 week for suture removal.  Mayra Reel, MD 1:04 PM

## 2023-02-19 ENCOUNTER — Encounter (HOSPITAL_COMMUNITY): Payer: Self-pay | Admitting: Orthopaedic Surgery

## 2023-02-23 ENCOUNTER — Telehealth: Payer: Self-pay | Admitting: Orthopaedic Surgery

## 2023-02-23 LAB — AEROBIC/ANAEROBIC CULTURE W GRAM STAIN (SURGICAL/DEEP WOUND): Culture: NO GROWTH

## 2023-02-23 NOTE — Telephone Encounter (Signed)
Patient called asked if he can get an appointment in the afternoon. Patient said his wife can not get any more time off. Patient said he can get a ride to his appointments in the afternoon. The number to contact patient is [phone]

## 2023-02-23 NOTE — Telephone Encounter (Signed)
Rescheduled patient

## 2023-02-24 ENCOUNTER — Encounter: Payer: Self-pay | Admitting: Physician Assistant

## 2023-02-24 ENCOUNTER — Ambulatory Visit (INDEPENDENT_AMBULATORY_CARE_PROVIDER_SITE_OTHER): Payer: BC Managed Care – PPO | Admitting: Orthopaedic Surgery

## 2023-02-24 DIAGNOSIS — M25461 Effusion, right knee: Secondary | ICD-10-CM

## 2023-02-24 NOTE — Progress Notes (Signed)
Post-Op Visit Note   Patient: Johnny [name]           Date of Birth: 03/23/1956           MRN: [id number] Visit Date: 02/24/2023 PCP: Myrlene Broker, MD   Assessment & Plan:  Chief Complaint:  Chief Complaint  Patient presents with   Right Knee - Follow-up    Right knee scope 02/18/2023   Visit Diagnoses:  1. Effusion, right knee     Plan: Ruffin is 1 week status post right knee scope.  He is overall doing well.  Does not report any significant pain.  Intraoperative cultures were negative for infection.  Exam of the right knee shows poor quad activation.  Expected postsurgical changes.  No signs of infection.  Range of motion is progressing.  Sutures removed.  I will send a referral to physical therapy for rehab and strengthening and gait training.  We will provide a knee immobilizer today to help compensate for poor quad function and he can wean as the quad improves.  Recheck in 4 weeks.  Follow-Up Instructions: Return in about 4 weeks (around 03/24/2023) for with lindsey.   Orders:  Orders Placed This Encounter  Procedures   Ambulatory referral to Physical Therapy   No orders of the defined types were placed in this encounter.   Imaging: No results found.  PMFS History: Patient Active Problem List   Diagnosis Date Noted   Rheumatoid factor positive 02/10/2023   Knee pain, right 01/21/2023   Hyperlipidemia 01/20/2023   Effusion, right knee 01/20/2023   Hypercoagulable state due to typical atrial flutter (HCC) 01/13/2023   Venous stasis of lower extremity 01/09/2023   BPH with obstruction/lower urinary tract symptoms 05/31/2020   Paroxysmal atrial flutter (HCC)    Adrenal nodule (HCC) 03/28/2020   OSA (obstructive sleep apnea) 03/28/2020   Morbid obesity (HCC) 03/28/2020   Medication noncompliance due to cognitive impairment 03/28/2020   Chronic systolic CHF (congestive heart failure) (HCC) 03/28/2020   CKD (chronic kidney disease) stage 4, GFR  15-29 ml/min (HCC) 06/10/2017   PSORIASIS, 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   Type 2 diabetes with complication (HCC) 08/03/2007   Hyperlipidemia associated with type 2 diabetes mellitus (HCC) 08/03/2007   Essential hypertension 08/03/2007   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   Qualifier: Diagnosis of  By: Debby Bud MD, Rosalyn Gess    Sleep apnea    Type II diabetes mellitus with renal manifestations, uncontrolled 08/03/2007   Qualifier: Diagnosis of  By: Debby Bud MD, Rosalyn Gess  Med metformin     Family History  Problem Relation Age of Onset   Memory loss Mother        DOB 40   Kidney disease Mother        s/p nephrectomy, infection problem   Dementia Mother    Hypertension Father  DOB [id number]   Hyperlipidemia Father    Diabetes Father    Coronary artery disease Father        with stents   Nephrolithiasis Father    Kidney disease Father        s/p rejected kidney transplant; on HD   Cancer Brother        lung   HIV Brother    Prostate cancer Neg Hx    Colon cancer Neg Hx     Past Surgical History:  Procedure Laterality Date   CARDIOVERSION N/A 07/18/2019   Procedure: CARDIOVERSION;  Surgeon: Lars Masson, MD;  Location: [facility] ENDOSCOPY;  Service: Cardiovascular;  Laterality: N/A;   IRRIGATION AND DEBRIDEMENT KNEE Right 02/18/2023   Procedure: RIGHT KNEE ARTHROSCOPIC IRRIGATION AND DEBRIDEMENT;  Surgeon: Tarry Kos, MD;  Location: [facility] ORS;  Service:  Orthopedics;  Laterality: Right;   TEE WITHOUT CARDIOVERSION N/A 07/18/2019   Procedure: TRANSESOPHAGEAL ECHOCARDIOGRAM (TEE);  Surgeon: Lars Masson, MD;  Location: [facility] ENDOSCOPY;  Service: Cardiovascular;  Laterality: N/A;   TOOTH EXTRACTION     XI ROBOTIC ASSISTED SIMPLE PROSTATECTOMY N/A 05/31/2020   Procedure: XI ROBOTIC ASSISTED SIMPLE PROSTATECTOMY-SUPRAPUBIC;  Surgeon: Malen Gauze, MD;  Location: [facility] ORS;  Service: Urology;  Laterality: N/A;   Social History   Occupational History   Occupation: mfg  Tobacco Use   Smoking status: Never    Passive exposure: Yes   Smokeless tobacco: Never  Vaping Use   Vaping status: Never Used  Substance and Sexual Activity   Alcohol use: No   Drug use: No   Sexual activity: Yes

## 2023-02-25 ENCOUNTER — Encounter: Payer: BC Managed Care – PPO | Admitting: Orthopaedic Surgery

## 2023-02-27 DIAGNOSIS — I4892 Unspecified atrial flutter: Secondary | ICD-10-CM | POA: Diagnosis not present

## 2023-03-05 ENCOUNTER — Other Ambulatory Visit: Payer: Self-pay | Admitting: Cardiology

## 2023-03-05 DIAGNOSIS — I4892 Unspecified atrial flutter: Secondary | ICD-10-CM

## 2023-03-06 NOTE — Telephone Encounter (Signed)
Prescription refill request for Eliquis received. Indication:aflutter Last office visit:9/24 Scr:2.46  9/24 Age: 67 Weight:129  kg  Prescription refilled

## 2023-03-09 ENCOUNTER — Ambulatory Visit (INDEPENDENT_AMBULATORY_CARE_PROVIDER_SITE_OTHER): Payer: BC Managed Care – PPO | Admitting: Physical Therapy

## 2023-03-09 ENCOUNTER — Encounter: Payer: Self-pay | Admitting: Physical Therapy

## 2023-03-09 ENCOUNTER — Other Ambulatory Visit: Payer: Self-pay

## 2023-03-09 DIAGNOSIS — M6281 Muscle weakness (generalized): Secondary | ICD-10-CM

## 2023-03-09 DIAGNOSIS — M25662 Stiffness of left knee, not elsewhere classified: Secondary | ICD-10-CM | POA: Diagnosis not present

## 2023-03-09 DIAGNOSIS — R2689 Other abnormalities of gait and mobility: Secondary | ICD-10-CM

## 2023-03-09 NOTE — Therapy (Signed)
OUTPATIENT PHYSICAL THERAPY LOWER EXTREMITY EVALUATION   Patient Name: Johnny [name] MRN: [id number] DOB:23-May-1955, 67 y.o., male Today's Date: 03/09/2023  END OF SESSION:  PT End of Session - 03/09/23 1533     Visit Number 1    Number of Visits 20    Date for PT Re-Evaluation 06/01/23    PT Start Time 1500    PT Stop Time 1545    PT Time Calculation (min) 45 min    Activity Tolerance Patient tolerated treatment well    Behavior During Therapy [facility] for tasks assessed/performed             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   Qualifier: Diagnosis of  By: Debby Bud MD, Rosalyn Gess    Sleep apnea    Type II diabetes mellitus with renal manifestations, uncontrolled 08/03/2007   Qualifier: Diagnosis of  By: Debby Bud MD, Rosalyn Gess  Med metformin    Past Surgical History:  Procedure Laterality Date   CARDIOVERSION N/A 07/18/2019   Procedure: CARDIOVERSION;  Surgeon: Lars Masson, MD;  Location: [facility] ENDOSCOPY;  Service: Cardiovascular;  Laterality: N/A;   IRRIGATION AND DEBRIDEMENT KNEE Right 02/18/2023   Procedure: RIGHT KNEE ARTHROSCOPIC IRRIGATION AND DEBRIDEMENT;  Surgeon: Tarry Kos, MD;  Location: [facility] ORS;  Service: Orthopedics;  Laterality: Right;   TEE WITHOUT CARDIOVERSION N/A 07/18/2019   Procedure: TRANSESOPHAGEAL ECHOCARDIOGRAM (TEE);  Surgeon: Lars Masson, MD;  Location: [facility] ENDOSCOPY;  Service: Cardiovascular;  Laterality: N/A;   TOOTH  EXTRACTION     XI ROBOTIC ASSISTED SIMPLE PROSTATECTOMY N/A 05/31/2020   Procedure: XI ROBOTIC ASSISTED SIMPLE PROSTATECTOMY-SUPRAPUBIC;  Surgeon: Malen Gauze, MD;  Location: [facility] ORS;  Service: Urology;  Laterality: N/A;   Patient Active Problem List   Diagnosis Date Noted   Rheumatoid factor positive 02/10/2023   Knee pain, right 01/21/2023   Hyperlipidemia 01/20/2023   Effusion, right knee 01/20/2023   Hypercoagulable state due to typical atrial flutter (HCC) 01/13/2023   Venous stasis of lower extremity 01/09/2023   BPH with obstruction/lower urinary tract symptoms 05/31/2020   Paroxysmal atrial flutter (HCC)    Adrenal nodule (HCC) 03/28/2020   OSA (obstructive sleep apnea) 03/28/2020   Morbid obesity (HCC) 03/28/2020   Medication noncompliance due to cognitive impairment 03/28/2020   Chronic systolic CHF (congestive heart failure) (HCC) 03/28/2020   CKD (chronic kidney disease) stage 4, GFR 15-29 ml/min (HCC) 06/10/2017   PSORIASIS, 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   Type 2 diabetes with complication (HCC) 08/03/2007   Hyperlipidemia associated with type 2 diabetes mellitus (HCC) 08/03/2007   Essential hypertension 08/03/2007    PCP: Myrlene Broker, MD   REFERRING PROVIDER: Tarry Kos, MD    REFERRING DIAG: [phone] (ICD-10-CM) - Effusion, right knee   THERAPY DIAG:  Muscle weakness (generalized)  Other abnormalities of gait and mobility  Stiffness of left knee, not elsewhere classified  Rationale for Evaluation and Treatment: Rehabilitation  ONSET DATE: Right knee scope,irrigation and debriement, major synovectomy 02/18/2023  SUBJECTIVE:   SUBJECTIVE STATEMENT: Relays he had knee surgery, he has been doing okay, was told to wear brace during ambulation until his leg strength improves  PERTINENT HISTORY: Right knee scope 02/18/2023 PMH:RA,OSA,Obesity,CHF,CKD,DM2 To wear knee immobilizer until quad function improves  PAIN:  NPRS scale: 4/10 upon arrival  with walking, no pain if he sits just right Pain location:Rt knee anterior Pain description: pulling Aggravating factors:sitting 90 90 deg without leg supported, walking Relieving factors: resting with leg supported and turning foot out some   PRECAUTIONS: None  RED FLAGS: None   WEIGHT BEARING RESTRICTIONS: No  FALLS:  Has patient fallen in last 6 months? 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  OBJECTIVE:  Note: Objective measures were completed at Evaluation unless otherwise noted.  DIAGNOSTIC FINDINGS:  XR 01/05/23 IMPRESSION: Mild osteoarthritis with moderate knee joint effusion.  PATIENT SURVEYS:  FOTO 57% functional goal is 71%  COGNITION: Overall cognitive status: Within functional limits for tasks assessed      EDEMA:  Moderate edema noted in Rt knee and Lower leg    LOWER EXTREMITY ROM:   AROM/PROM Right eval   Hip flexion    Hip extension    Hip abduction    Hip adduction    Hip internal rotation    Hip external rotation    Knee flexion 60/80   Knee extension 15/10   Ankle dorsiflexion    Ankle plantarflexion    Ankle inversion    Ankle eversion     (Blank rows = not tested)  LOWER EXTREMITY MMT:  MMT Right eval Left eval  Hip flexion 2   Hip extension    Hip abduction    Hip adduction    Hip internal rotation    Hip external rotation    Knee flexion 3   Knee extension 2   Ankle dorsiflexion    Ankle plantarflexion    Ankle inversion    Ankle eversion     (Blank rows = not tested)  GAIT: Eval Comments: Currently using RW and in knee immobilizer druing ambulation. He did not use AD PLOF   TODAY'S TREATMENT:  Eval HEP  creation and review with demonstration and trial set preformed, see below for details -Vasopnuematic device X 10 min, medium compression, 34 deg to Rt knee in elevation    PATIENT EDUCATION: Education details: HEP, PT plan of care Person educated: Patient Education method: Explanation, Demonstration, Verbal cues, and Handouts Education comprehension: verbalized understanding and needs further education   HOME EXERCISE PROGRAM: Access Code: [facility] URL: [email or web address] Date: 03/09/2023 Prepared by: Ivery Quale  Exercises - Supine Quad Set  - 2 x daily - 6 x weekly - 2-3 sets - 10 reps - 5 sec hold - Supine Heel Slide with Strap  - 2 x daily - 6 x weekly - 2-3 sets - 10 reps - 5 hold - straight leg raise with help from strap  - 2 x daily - 6 x weekly - 1-2 sets - 10 reps - Seated March  - 2 x daily -  6 x weekly - 2-3 sets - 10 reps - Seated Long Arc Quad  - 2 x daily - 6 x weekly - 2-3 sets - 10 reps - 3 hold  ASSESSMENT:  CLINICAL IMPRESSION: Patient referred to PT S/P Right knee scope,irrigation and debriement, major synovectomy 02/18/2023. He is having difficulty with regaining quad strength which will be the focus of PT along with restoring ROM and normal ambulation. sPatient will benefit from skilled PT to address below impairments, limitations and improve overall function.  OBJECTIVE IMPAIRMENTS: decreased activity tolerance, difficulty walking, decreased balance, decreased endurance, decreased mobility, decreased ROM, decreased strength, impaired flexibility, impaired LE use, and pain.  ACTIVITY LIMITATIONS: bending, lifting, carry, locomotion, cleaning, community activity, driving, and  occupation  PERSONAL FACTORS: see above PMH are also affecting patient's functional outcome.  REHAB POTENTIAL: Good  CLINICAL DECISION MAKING: Stable/uncomplicated  EVALUATION COMPLEXITY: Low    GOALS: Short term PT Goals Target date: 04/06/2023   Pt will be I  and compliant with HEP. Baseline:  Goal status: New   Long term PT goals Target date:06/01/2023   Pt will improve Rt knee ROM to [facility] (3-115 deg) to improve functional mobility Baseline: Goal status: New Pt will improve Rt hip/knee strength to at least 4+/5 MMT to improve functional strength Baseline: Goal status: New Pt will improve FOTO to at least 71% functional to show improved function Baseline: Goal status: New Pt will reduce pain to overall less than 2-3/10 with usual activity and feel ready to return to work activity. Baseline: Goal status: New Pt will be able to ambulate community distances at least 1000 ft WNL gait pattern without AD Baseline: Goal status: New  PLAN: PT FREQUENCY: 1-3 times per week   PT DURATION: 6-12 weeks  PLANNED INTERVENTIONS (unless contraindicated): aquatic PT, Canalith repositioning, cryotherapy, Electrical stimulation, Iontophoresis with 4 mg/ml dexamethasome, Moist heat, traction, Ultrasound, gait training, Therapeutic exercise, balance training, neuromuscular re-education, patient/family education, prosthetic training, manual techniques, passive ROM, dry needling, taping, vasopnuematic device, vestibular, spinal manipulations, joint manipulations 97110-Therapeutic exercises, 97530- Therapeutic activity, 97112- Neuromuscular re-education, 97535- Self Care, and 60109- Manual therapy  PLAN FOR NEXT SESSION:  To wear knee immobilizer until quad function improves Quad activation focus and knee ROM as tolerated  April Manson, PT,DPT 03/09/2023, 3:34 PM

## 2023-03-13 ENCOUNTER — Encounter: Payer: Self-pay | Admitting: Rehabilitative and Restorative Service Providers"

## 2023-03-13 ENCOUNTER — Ambulatory Visit (INDEPENDENT_AMBULATORY_CARE_PROVIDER_SITE_OTHER): Payer: BC Managed Care – PPO | Admitting: Rehabilitative and Restorative Service Providers"

## 2023-03-13 DIAGNOSIS — R2689 Other abnormalities of gait and mobility: Secondary | ICD-10-CM

## 2023-03-13 DIAGNOSIS — M25662 Stiffness of left knee, not elsewhere classified: Secondary | ICD-10-CM

## 2023-03-13 DIAGNOSIS — M6281 Muscle weakness (generalized): Secondary | ICD-10-CM | POA: Diagnosis not present

## 2023-03-13 NOTE — Therapy (Signed)
OUTPATIENT PHYSICAL THERAPY LOWER EXTREMITY TREATMENT   Patient Name: Johnny [name] MRN: [id number] DOB:1956/02/25, 67 y.o., male Today's Date: 03/13/2023  END OF SESSION:  PT End of Session - 03/13/23 1144     Visit Number 2    Number of Visits 20    Date for PT Re-Evaluation 06/01/23    PT Start Time 1104    PT Stop Time 1149    PT Time Calculation (min) 45 min    Activity Tolerance Patient tolerated treatment well;No increased pain    Behavior During Therapy [facility] for tasks assessed/performed             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   Qualifier: Diagnosis of  By: Debby Bud MD, Rosalyn Gess    Sleep apnea    Type II diabetes mellitus with renal manifestations, uncontrolled 08/03/2007   Qualifier: Diagnosis of  By: Debby Bud MD, Rosalyn Gess  Med metformin    Past Surgical History:  Procedure Laterality Date   CARDIOVERSION N/A 07/18/2019   Procedure: CARDIOVERSION;  Surgeon: Lars Masson, MD;  Location: [facility] ENDOSCOPY;  Service: Cardiovascular;  Laterality: N/A;   IRRIGATION AND DEBRIDEMENT KNEE Right 02/18/2023   Procedure: RIGHT KNEE ARTHROSCOPIC IRRIGATION AND DEBRIDEMENT;  Surgeon: Tarry Kos, MD;  Location: [facility] ORS;  Service: Orthopedics;  Laterality: Right;   TEE WITHOUT CARDIOVERSION N/A 07/18/2019   Procedure: TRANSESOPHAGEAL ECHOCARDIOGRAM (TEE);  Surgeon: Lars Masson, MD;  Location: [facility] ENDOSCOPY;  Service: Cardiovascular;   Laterality: N/A;   TOOTH EXTRACTION     XI ROBOTIC ASSISTED SIMPLE PROSTATECTOMY N/A 05/31/2020   Procedure: XI ROBOTIC ASSISTED SIMPLE PROSTATECTOMY-SUPRAPUBIC;  Surgeon: Malen Gauze, MD;  Location: [facility] ORS;  Service: Urology;  Laterality: N/A;   Patient Active Problem List   Diagnosis Date Noted   Rheumatoid factor positive 02/10/2023   Knee pain, right 01/21/2023   Hyperlipidemia 01/20/2023   Effusion, right knee 01/20/2023   Hypercoagulable state due to typical atrial flutter (HCC) 01/13/2023   Venous stasis of lower extremity 01/09/2023   BPH with obstruction/lower urinary tract symptoms 05/31/2020   Paroxysmal atrial flutter (HCC)    Adrenal nodule (HCC) 03/28/2020   OSA (obstructive sleep apnea) 03/28/2020   Morbid obesity (HCC) 03/28/2020   Medication noncompliance due to cognitive impairment 03/28/2020   Chronic systolic CHF (congestive heart failure) (HCC) 03/28/2020   CKD (chronic kidney disease) stage 4, GFR 15-29 ml/min (HCC) 06/10/2017   PSORIASIS, 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   Type 2 diabetes with complication (HCC) 08/03/2007   Hyperlipidemia associated with type 2 diabetes mellitus (HCC) 08/03/2007   Essential hypertension 08/03/2007    PCP: Myrlene Broker, MD   REFERRING PROVIDER: Tarry Kos, MD    REFERRING DIAG: [phone] (ICD-10-CM) - Effusion, right knee   THERAPY DIAG:  Muscle weakness (generalized)  Other abnormalities of gait and mobility  Stiffness of left knee, not elsewhere  classified  Rationale for Evaluation and Treatment: Rehabilitation  ONSET DATE: Right knee scope, irrigation and debriement, major synovectomy 02/18/2023  SUBJECTIVE:   SUBJECTIVE STATEMENT: Johnny [name] reports good early HEP compliance.  He was instructed by Dr. Roda Shutters to wear his brace during ambulation until his leg strength improves.  PERTINENT HISTORY: Right knee scope 02/18/2023 PMH:RA,OSA,Obesity,CHF,CKD,DM2 To wear knee immobilizer until quad function  improves  PAIN:  NPRS scale: 4/10 upon arrival with walking, no pain if he sits just right Pain location: Rt knee anterior Pain description: pulling Aggravating factors: sitting 90 90 deg without leg supported, walking Relieving factors: resting with leg supported and turning foot out some   PRECAUTIONS: None  RED FLAGS: None   WEIGHT BEARING RESTRICTIONS: No  FALLS:  Has patient fallen in last 6 months? 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  OBJECTIVE:  Note: Objective measures were completed at Evaluation unless otherwise noted.  DIAGNOSTIC FINDINGS:  XR 01/05/23 IMPRESSION: Mild osteoarthritis with moderate knee joint effusion.  PATIENT SURVEYS:  FOTO 57% functional goal is 71%  COGNITION: Overall cognitive status: Within functional limits for tasks assessed      EDEMA:  Moderate edema noted in Rt knee and Lower leg   LOWER EXTREMITY ROM:   AROM/PROM Right eval Right 03/13/2023  Hip flexion    Hip extension    Hip abduction    Hip adduction    Hip internal rotation    Hip external rotation    Knee flexion 60/80 Passive 97  Knee extension 15/10 Passive lacks 10  Ankle dorsiflexion    Ankle plantarflexion    Ankle inversion    Ankle eversion     (Blank rows = not tested)  LOWER EXTREMITY MMT:  MMT Right eval Left eval  Hip flexion 2   Hip extension    Hip abduction    Hip adduction    Hip internal rotation    Hip external rotation    Knee flexion 3   Knee extension 2   Ankle dorsiflexion    Ankle plantarflexion    Ankle inversion    Ankle eversion     (Blank rows = not tested)  GAIT: Eval Comments: Currently using RW and in knee  immobilizer druing ambulation. He did not use AD PLOF   TODAY'S TREATMENT:  03/13/2023 Supine knee flexion with strap 2 sets of 10 for 5 seconds Supine quadriceps sets with heel prop 2 sets of 10 for 5 seconds Straight leg raise with help from strap 10 x 10 seconds Seated knee flexion AAROM (left pushes right into flexion) 10 x 5 seconds Seated LAQ, attempted and held due to knee pain Seated marching 10 x  Vaso Medium right knee 10 minutes (update 1 change to HEP and review while on vaso)   Eval HEP creation and review with demonstration and trial set preformed, see below for details -Vasopnuematic device X 10 min, medium compression, 34 deg to Rt knee in elevation   PATIENT EDUCATION: Education details: HEP, PT plan of care Person educated: Patient Education method: Explanation, Demonstration, Verbal cues, and Handouts Education comprehension: verbalized understanding and needs further education   HOME EXERCISE PROGRAM: Access  Code: [email or web address] URL: [email or web address] Date: 03/09/2023 Prepared by: Ivery Quale  Exercises - Supine Quad Set  - 2 x daily - 6 x weekly - 2-3 sets - 10 reps - 5 sec hold - Supine Heel Slide with Strap  - 2 x daily - 6 x weekly - 2-3 sets - 10 reps - 5 hold - straight leg raise with help from strap  - 2 x daily - 6 x weekly - 1-2 sets - 10 reps - Seated March  - 2 x daily - 6 x weekly - 2-3 sets - 10 reps - Seated Long Arc Quad  - 2 x daily - 6 x weekly - 2-3 sets - 10 reps - 3 hold  ASSESSMENT:  CLINICAL IMPRESSION: Johnny [name] had great compliance with his HEP.  We made 1 change in that I moved the towel roll under his knee to under his heel with quadriceps sets to emphasize extension.  Also advised Johnny [name] to do quadriceps sets bilaterally for neurological overflow from strong to weak.  Continue early emphasis on quadriceps activation, knee extension AROM and knee flexion AROM.  OBJECTIVE IMPAIRMENTS: decreased activity tolerance,  difficulty walking, decreased balance, decreased endurance, decreased mobility, decreased ROM, decreased strength, impaired flexibility, impaired LE use, and pain.  ACTIVITY LIMITATIONS: bending, lifting, carry, locomotion, cleaning, community activity, driving, and  occupation  PERSONAL FACTORS: see above PMH are also affecting patient's functional outcome.  REHAB POTENTIAL: Good  CLINICAL DECISION MAKING: Stable/uncomplicated  EVALUATION COMPLEXITY: Low    GOALS: Short term PT Goals Target date: 04/06/2023   Pt will be I and compliant with HEP. Baseline:  Goal status: On Going 03/13/2023   Long term PT goals Target date:06/01/2023   Pt will improve Rt knee ROM to [facility] (3-115 deg) to improve functional mobility Baseline: Goal status: New Pt will improve Rt hip/knee strength to at least 4+/5 MMT to improve functional strength Baseline: Goal status: New Pt will improve FOTO to at least 71% functional to show improved function Baseline: Goal status: New Pt will reduce pain to overall less than 2-3/10 with usual activity and feel ready to return to work activity. Baseline: Goal status: New Pt will be able to ambulate community distances at least 1000 ft WNL gait pattern without AD Baseline: Goal status: New  PLAN: PT FREQUENCY: 1-3 times per week   PT DURATION: 6-12 weeks  PLANNED INTERVENTIONS (unless contraindicated): aquatic PT, Canalith repositioning, cryotherapy, Electrical stimulation, Iontophoresis with 4 mg/ml dexamethasome, Moist heat, traction, Ultrasound, gait training, Therapeutic exercise, balance training, neuromuscular re-education, patient/family education, prosthetic training, manual techniques, passive ROM, dry needling, taping, vasopnuematic device, vestibular, spinal manipulations, joint manipulations 97110-Therapeutic exercises, 97530- Therapeutic activity, 97112- Neuromuscular re-education, 97535- Self Care, and 16109- Manual therapy  PLAN FOR NEXT  SESSION:  To wear knee immobilizer until quad function improves Quad activation focus and knee ROM  Cherlyn Cushing, PT, MPT 03/13/2023, 11:45 AM

## 2023-03-15 ENCOUNTER — Other Ambulatory Visit: Payer: Self-pay | Admitting: Internal Medicine

## 2023-03-16 ENCOUNTER — Ambulatory Visit (INDEPENDENT_AMBULATORY_CARE_PROVIDER_SITE_OTHER): Payer: BC Managed Care – PPO | Admitting: Physical Therapy

## 2023-03-16 ENCOUNTER — Encounter: Payer: Self-pay | Admitting: Physical Therapy

## 2023-03-16 DIAGNOSIS — M6281 Muscle weakness (generalized): Secondary | ICD-10-CM

## 2023-03-16 DIAGNOSIS — M25662 Stiffness of left knee, not elsewhere classified: Secondary | ICD-10-CM | POA: Diagnosis not present

## 2023-03-16 DIAGNOSIS — R2689 Other abnormalities of gait and mobility: Secondary | ICD-10-CM

## 2023-03-16 NOTE — Therapy (Signed)
OUTPATIENT PHYSICAL THERAPY LOWER EXTREMITY TREATMENT   Patient Name: Johnny [name] MRN: [id number] DOB:July 10, 1955, 67 y.o., male Today's Date: 03/16/2023  END OF SESSION:  PT End of Session - 03/16/23 1143     Visit Number 3    Number of Visits 20    Date for PT Re-Evaluation 06/01/23    PT Start Time 1145    PT Stop Time 1238    PT Time Calculation (min) 53 min    Activity Tolerance Patient tolerated treatment well;No increased pain    Behavior During Therapy [facility] for tasks assessed/performed              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   Qualifier: Diagnosis of  By: Debby Bud MD, Rosalyn Gess    Sleep apnea    Type II diabetes mellitus with renal manifestations, uncontrolled 08/03/2007   Qualifier: Diagnosis of  By: Debby Bud MD, Rosalyn Gess  Med metformin    Past Surgical History:  Procedure Laterality Date   CARDIOVERSION N/A 07/18/2019   Procedure: CARDIOVERSION;  Surgeon: Lars Masson, MD;  Location: [facility] ENDOSCOPY;  Service: Cardiovascular;  Laterality: N/A;   IRRIGATION AND DEBRIDEMENT KNEE Right 02/18/2023   Procedure: RIGHT KNEE ARTHROSCOPIC IRRIGATION AND DEBRIDEMENT;  Surgeon: Tarry Kos, MD;  Location: [facility] ORS;  Service: Orthopedics;  Laterality: Right;   TEE WITHOUT CARDIOVERSION N/A 07/18/2019   Procedure: TRANSESOPHAGEAL ECHOCARDIOGRAM (TEE);  Surgeon: Lars Masson, MD;  Location: [facility] ENDOSCOPY;  Service: Cardiovascular;   Laterality: N/A;   TOOTH EXTRACTION     XI ROBOTIC ASSISTED SIMPLE PROSTATECTOMY N/A 05/31/2020   Procedure: XI ROBOTIC ASSISTED SIMPLE PROSTATECTOMY-SUPRAPUBIC;  Surgeon: Malen Gauze, MD;  Location: [facility] ORS;  Service: Urology;  Laterality: N/A;   Patient Active Problem List   Diagnosis Date Noted   Rheumatoid factor positive 02/10/2023   Knee pain, right 01/21/2023   Hyperlipidemia 01/20/2023   Effusion, right knee 01/20/2023   Hypercoagulable state due to typical atrial flutter (HCC) 01/13/2023   Venous stasis of lower extremity 01/09/2023   BPH with obstruction/lower urinary tract symptoms 05/31/2020   Paroxysmal atrial flutter (HCC)    Adrenal nodule (HCC) 03/28/2020   OSA (obstructive sleep apnea) 03/28/2020   Morbid obesity (HCC) 03/28/2020   Medication noncompliance due to cognitive impairment 03/28/2020   Chronic systolic CHF (congestive heart failure) (HCC) 03/28/2020   CKD (chronic kidney disease) stage 4, GFR 15-29 ml/min (HCC) 06/10/2017   PSORIASIS, 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   Type 2 diabetes with complication (HCC) 08/03/2007   Hyperlipidemia associated with type 2 diabetes mellitus (HCC) 08/03/2007   Essential hypertension 08/03/2007    PCP: Myrlene Broker, MD   REFERRING PROVIDER: Tarry Kos, MD    REFERRING DIAG: [phone] (ICD-10-CM) - Effusion, right knee   THERAPY DIAG:  Muscle weakness (generalized)  Other abnormalities of gait and mobility  Stiffness of left knee, not  elsewhere classified  Rationale for Evaluation and Treatment: Rehabilitation  ONSET DATE: Right knee scope, irrigation and debriement, major synovectomy 02/18/2023  SUBJECTIVE:   SUBJECTIVE STATEMENT: He has been doing the exercises that PT gave him without issues.  He ices knee after exercise.    PERTINENT HISTORY: Right knee scope 02/18/2023 PMH:RA,OSA,Obesity,CHF,CKD,DM2 To wear knee immobilizer until quad function improves  PAIN:  NPRS scale:   0/10 upon arrival  with walking into clinic & since last PT session up to 4/10 Pain location: Rt knee anterior Pain description: pulling Aggravating factors: sitting 90 90 deg without leg supported, walking Relieving factors: resting with leg supported and turning foot out some   PRECAUTIONS: None  RED FLAGS: None   WEIGHT BEARING RESTRICTIONS: No  FALLS:  Has patient fallen in last 6 months? 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  OBJECTIVE:  Note: Objective measures were completed at Evaluation unless otherwise noted.  DIAGNOSTIC FINDINGS:  XR 01/05/23 IMPRESSION: Mild osteoarthritis with moderate knee joint effusion.  PATIENT SURVEYS:  FOTO 57% functional goal is 71%  COGNITION: Overall cognitive status: Within functional limits for tasks assessed      EDEMA:  Moderate edema noted in Rt knee and Lower leg   LOWER EXTREMITY ROM:   AROM/PROM Right eval Right 03/13/2023  Hip flexion    Hip extension    Hip abduction    Hip adduction    Hip internal rotation    Hip external rotation    Knee flexion 60/80 Passive 97  Knee extension 15/10 Passive lacks 10  Ankle dorsiflexion    Ankle plantarflexion    Ankle inversion    Ankle eversion     (Blank rows = not tested)  LOWER EXTREMITY MMT:  MMT Right eval Left eval  Hip flexion 2   Hip extension    Hip abduction    Hip adduction    Hip internal rotation    Hip external rotation    Knee flexion 3   Knee extension 2   Ankle dorsiflexion    Ankle plantarflexion    Ankle inversion    Ankle eversion     (Blank rows = not tested)  GAIT: Eval Comments: Currently using RW and in knee immobilizer druing ambulation. He  did not use AD PLOF   TODAY'S TREATMENT:  03/16/2023: Therapeutic Exercise: Nustep seat 15 level 5 with BLEs & BUEs for 6 min, then BLEs only for 2 min Seated heel slide with foot on pillow case: ext / quad set and active knee flexion end range stretch with LLE assist followed by isometric hamstring  10 reps 2 sets Hamstring stretch seated with strap 30 sec hold 2 reps  Following exercises until PT noted muscle fatigue: Supine quad set with ankle on towel roll with PT tactile cues for VMO 3 sec hold 6 reps 2 sets Supine RLE SAQ PT assisted - tactile cues for VMO prior to concentric, reset max ext and controlled eccentric 5 reps prior to fatigue Sidelying manual assist closed chain leg press 6 reps prior to fatigue  Therapeutic Activities: Pt amb 40' with RW without knee immobilizer with CGA. Pt excessive UE support on  RW to control knee instability with one buckling controlled by UEs.   PT cued on using pillows in bed for positioning and "tenting" sheets off feet.  Pt verbalized understanding. Pt requires assistance by PT or his LLE to move RLE in / out of bed.   Manual Therapy: PROM with overpressure for knee ext  Vasopneumatic: Medium right knee 10 minutes 34* with elevation   03/13/2023 Supine knee flexion with strap 2 sets of 10 for 5 seconds Supine quadriceps sets with heel prop 2 sets of 10 for 5 seconds Straight leg raise with help from strap 10 x 10 seconds Seated knee flexion AAROM (left pushes right into flexion) 10 x 5 seconds Seated LAQ, attempted and held due to knee pain Seated marching 10 x  Vaso Medium right knee 10 minutes (update 1 change to HEP and review while on vaso)   Eval HEP creation and review with demonstration and trial set preformed, see below for details -Vasopnuematic device X 10 min, medium compression, 34 deg to Rt knee in elevation   PATIENT EDUCATION: Education details: HEP, PT plan of care Person educated: Patient Education method:  Explanation, Demonstration, Verbal cues, and Handouts Education comprehension: verbalized understanding and needs further education   HOME EXERCISE PROGRAM: Access Code: [facility] URL: [email or web address] Date: 03/09/2023 Prepared by: Ivery Quale  Exercises - Supine Quad Set  - 2 x daily - 6 x weekly - 2-3 sets - 10 reps - 5 sec hold - Supine Heel Slide with Strap  - 2 x daily - 6 x weekly - 2-3 sets - 10 reps - 5 hold - straight leg raise with help from strap  - 2 x daily - 6 x weekly - 1-2 sets - 10 reps - Seated March  - 2 x daily - 6 x weekly - 2-3 sets - 10 reps - Seated Long Arc Quad  - 2 x daily - 6 x weekly - 2-3 sets - 10 reps - 3 hold    ASSESSMENT:  CLINICAL IMPRESSION:   patient responded to tactile cues for quad activities.  His quad fatigues quickly with each exercise but responds to different exercise to continue to work on strength.  Patient continues to benefit from skilled PT.    OBJECTIVE IMPAIRMENTS: decreased activity tolerance, difficulty walking, decreased balance, decreased endurance, decreased mobility, decreased ROM, decreased strength, impaired flexibility, impaired LE use, and pain.  ACTIVITY LIMITATIONS: bending, lifting, carry, locomotion, cleaning, community activity, driving, and  occupation  PERSONAL FACTORS: see above PMH are also affecting patient's functional outcome.  REHAB POTENTIAL: Good  CLINICAL DECISION MAKING: Stable/uncomplicated  EVALUATION COMPLEXITY: Low    GOALS: Short term PT Goals Target date: 04/06/2023   Pt will be I and compliant with HEP. Baseline:  Goal status: Ongoing 03/16/2023   Long term PT goals Target date:06/01/2023   Pt will improve Rt knee ROM to [facility] (3-115 deg) to improve functional mobility Baseline: Goal status: Ongoing 03/16/2023 Pt will improve Rt hip/knee strength to at least 4+/5 MMT to improve functional strength Baseline: Goal status: Ongoing 03/16/2023 Pt will improve FOTO to  at least 71% functional to show improved function Baseline: Goal status: Ongoing 03/16/2023 Pt will reduce pain to overall less than 2-3/10 with usual activity and feel ready to return to work activity. Baseline: Goal status: Ongoing 03/16/2023 Pt will be able to ambulate community distances at least 1000 ft WNL gait pattern without AD Baseline: Goal status: Ongoing 03/16/2023  PLAN: PT FREQUENCY: 1-3 times per week  PT DURATION: 6-12 weeks  PLANNED INTERVENTIONS (unless contraindicated): aquatic PT, Canalith repositioning, cryotherapy, Electrical stimulation, Iontophoresis with 4 mg/ml dexamethasome, Moist heat, traction, Ultrasound, gait training, Therapeutic exercise, balance training, neuromuscular re-education, patient/family education, prosthetic training, manual techniques, passive ROM, dry needling, taping, vasopnuematic device, vestibular, spinal manipulations, joint manipulations 97110-Therapeutic exercises, 97530- Therapeutic activity, [id number]- Neuromuscular re-education, 97535- Self Care, and 95621- Manual therapy  PLAN FOR NEXT SESSION:  Work on gait without knee immobilizer for short distances.  Quad activation focus and knee ROM  [name] Faster, PT, DPT 03/16/2023, 4:25 PM

## 2023-03-17 ENCOUNTER — Encounter: Payer: Self-pay | Admitting: Cardiology

## 2023-03-17 ENCOUNTER — Ambulatory Visit: Payer: BC Managed Care – PPO | Attending: Cardiology | Admitting: Cardiology

## 2023-03-17 VITALS — BP 164/84 | HR 77 | Ht 77.0 in | Wt 275.0 lb

## 2023-03-17 DIAGNOSIS — I10 Essential (primary) hypertension: Secondary | ICD-10-CM | POA: Diagnosis not present

## 2023-03-17 DIAGNOSIS — I483 Typical atrial flutter: Secondary | ICD-10-CM

## 2023-03-17 DIAGNOSIS — Z01812 Encounter for preprocedural laboratory examination: Secondary | ICD-10-CM

## 2023-03-17 DIAGNOSIS — D6869 Other thrombophilia: Secondary | ICD-10-CM

## 2023-03-17 DIAGNOSIS — I4892 Unspecified atrial flutter: Secondary | ICD-10-CM | POA: Diagnosis not present

## 2023-03-17 DIAGNOSIS — G4733 Obstructive sleep apnea (adult) (pediatric): Secondary | ICD-10-CM | POA: Diagnosis not present

## 2023-03-17 NOTE — Patient Instructions (Addendum)
Medication Instructions:  Your physician recommends that you continue on your current medications as directed. Please refer to the Current Medication list given to you today.  *If you need a refill on your cardiac medications before your next appointment, please call your pharmacy*  Lab Work: BMET and CBC - at [facility], [address]. Come anytime between 12/1 and 12/23. You do not need an appointment. The lab is open M-F 7:30am-4:30pm and closed for lunch from 12:30pm-1:30pm.   Testing/Procedures: Your physician has recommended that you have an ablation. Catheter ablation is a medical procedure used to treat some cardiac arrhythmias (irregular heartbeats). During catheter ablation, a long, thin, flexible tube is put into a blood vessel in your groin (upper thigh), or neck. This tube is called an ablation catheter. It is then guided to your heart through the blood vessel. Radio frequency waves destroy small areas of heart tissue where abnormal heartbeats may cause an arrhythmia to start. Please see the instruction sheet given to you today.  Follow-Up: At [facility], you and your health needs are our priority.  As part of our continuing mission to provide you with exceptional heart care, we have created designated Provider Care Teams.  These Care Teams include your primary Cardiologist (physician) and Advanced Practice Providers (APPs -  Physician Assistants and Nurse Practitioners) who all work together to provide you with the care you need, when you need it.  Your next appointment:   We will call you to schedule your follow up appointments

## 2023-03-17 NOTE — Progress Notes (Signed)
Electrophysiology Office Note:   Date:  03/17/2023  ID:  Johnny [name], DOB [birth year], MRN [id number]  Primary Cardiologist: Armanda Magic, MD Electrophysiologist: Nobie Putnam, MD      History of Present Illness:   Johnny [name] is a 67 y.o. male with h/o CKD stage 4, DM, HTN, psoriasis, aortic atherosclerosis, OSA and arrhythmia induced cardiomyopathy who is seen today for Electrophysiology evaluation of atrial flutter at the request of Dr. Mayford Knife.    Patient was diagnosed with atrial flutter initially in 2021.  At that time he was admitted and underwent TEE guided cardioversion.  While in atrial flutter, echocardiogram showed an LVEF of 35 to 40%.  Repeat echo after cardioversion while in normal sinus rhythm did show recovery of his LVEF to 60-65%.  Patient recently presented to the emergency room on 01/05/2023 for right knee pain and swelling and was found to be in atrial flutter during that admission.  He again underwent successful cardioversion to normal sinus rhythm.  Patient reports being asymptomatic during both episodes; the first presenting to the ED with flulike symptoms in the second with severe right knee pain.    Since last clinic visit, he underwent surgery on his right knee. He thinks he is recovering from this well. He has remained in normal rhythm throughout. He denies palpitations, chest pain, shortness of breath, dizziness, and lightheadedness.  Review of systems complete and found to be negative unless listed in HPI.   EP Information / Studies Reviewed:   EKG ordered today. EKG Interpretation Date/Time:  Tuesday March 17 2023 14:05:26 EST Ventricular Rate:  77 PR Interval:  142 QRS Duration:  80 QT Interval:  400 QTC Calculation: 452 R Axis:   56  Text Interpretation: Normal sinus rhythm Left atrial enlargement Nonspecific T wave abnormality When compared with ECG of 20-Jan-2023 09:46,no significant change. Confirmed by Nobie Putnam [id number]) on 03/17/2023  6:55:31 PM   EKG 03/27/20:    EKG 01/06/23:    Echocardiogram 08/19/2019: Normal LV size and function.  LVEF 60 to 65%.  Mild concentric LVH. Normal RV function.  Mildly enlarged RV with normal pulmonary artery systolic pressures. Moderately dilated left and right atrial size. No significant valvular heart disease mentioned.  TEE 07/18/19: -Performed in the setting of atrial flutter. Severely reduced LV systolic function, LVEF 25 to 88%.  Global hypokinesis with no wall motion abnormalities.  Risk Assessment/Calculations:    CHA2DS2-VASc Score = 4   This indicates a 4.8% annual risk of stroke. The patient's score is based upon: CHF History: 1 (previous TCM) HTN History: 1 Diabetes History: 1 Stroke History: 0 Vascular Disease History: 1 (aortic atherosclerosis) Age Score: 0 Gender Score: 0        Physical Exam:   VS:  BP (!) 164/84   Pulse 77   Ht 6\' 5"  (1.956 m)   Wt 275 lb (124.7 kg)   SpO2 96%   BMI 32.61 kg/m    Wt Readings from Last 3 Encounters:  03/17/23 275 lb (124.7 kg)  02/18/23 284 lb 6.3 oz (129 kg)  02/12/23 284 lb 6.3 oz (129 kg)     GEN: Well nourished, well developed in no acute distress NECK: No JVD; No carotid bruits CARDIAC: Normal rate and regular rhythm. RESPIRATORY:  Clear to auscultation without rales, wheezing or rhonchi  ABDOMEN: Soft, non-tender, non-distended EXTREMITIES: Bilateral edema present; No deformity   ASSESSMENT AND PLAN:   Johnny [name] is a 67 y.o. male with h/o CKD stage 4,  DM, HTN, psoriasis, aortic atherosclerosis, OSA and arrhythmia induced cardiomyopathy who is seen today for Electrophysiology evaluation of atrial flutter at the request of Dr. Mayford Knife.  Patient has had multiple episodes of typical atrial flutter.  Importantly, during 1 of these episodes echocardiogram was done and showed depressed LV function which did improve post cardioversion, consistent with an arrhythmia induced cardiomyopathy.  Given this, I think  it is important that we prevent further episodes with a rhythm control strategy even though he has minimal symptoms. He is recovering from right knee surgery.   #. Typical atrial flutter: #.  Arrhythmia induced cardiomyopathy: -His CKD stage IV and other comorbidities limit antiarrhythmic options.  We will continue to use oral amiodarone in the short-term until ablation.  -Patient recently completed Zio monitor to assess for Afib. Results are pending.  -Discussed treatment options today for AFL. Discussed risks, recovery and likelihood of success with each treatment strategy. Risk, benefits, and alternatives to EP study and ablation for afib were discussed. These risks include but are not limited to stroke, bleeding, vascular damage, tamponade, perforation, damage to the esophagus, lungs, phrenic nerve and other structures, pulmonary vein stenosis, worsening renal function, coronary vasospasm and death.  Discussed potential need for repeat ablation procedures and antiarrhythmic drugs after an initial ablation. The patient understands these risk and wishes to proceed.  We will therefore proceed with catheter ablation at the next available time.  Carto, ICE, anesthesia are requested for the procedure. If Zio shows Afib then we will get CT PV protocol prior to the procedure to exclude LAA thrombus and further evaluate atrial anatomy and perform PVI as well.  #.  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 -Continue Eliquis.  Tolerating without issue.  #. Hypertension:  -Above goal today.  Emphasized the importance of well-controlled blood pressure on preventing occurrences of atrial fibrillation and atrial flutter.   -Continue current regimen and close follow-up with primary care.  #.  Obstructive sleep apnea: -He is compliant with CPAP.  Total time of encounter: 67 minutes total time of encounter, including chart review, face-to-face patient care, coordination of  care and counseling regarding high complexity medical decision making.   Signed, Nobie Putnam, MD

## 2023-03-19 ENCOUNTER — Encounter: Payer: BC Managed Care – PPO | Admitting: Rehabilitative and Restorative Service Providers"

## 2023-03-19 ENCOUNTER — Ambulatory Visit: Payer: BC Managed Care – PPO | Admitting: Physical Therapy

## 2023-03-19 ENCOUNTER — Encounter: Payer: Self-pay | Admitting: Physical Therapy

## 2023-03-19 DIAGNOSIS — R2689 Other abnormalities of gait and mobility: Secondary | ICD-10-CM

## 2023-03-19 DIAGNOSIS — M6281 Muscle weakness (generalized): Secondary | ICD-10-CM | POA: Diagnosis not present

## 2023-03-19 DIAGNOSIS — M25662 Stiffness of left knee, not elsewhere classified: Secondary | ICD-10-CM | POA: Diagnosis not present

## 2023-03-19 NOTE — Therapy (Signed)
OUTPATIENT PHYSICAL THERAPY LOWER EXTREMITY TREATMENT   Patient Name: Johnny [name] MRN: [id number] DOB:June 28, 1955, 67 y.o., male Today's Date: 03/19/2023  END OF SESSION:  PT End of Session - 03/19/23 1105     Visit Number 4    Number of Visits 20    Date for PT Re-Evaluation 06/01/23    PT Start Time 1101    PT Stop Time 1155    PT Time Calculation (min) 54 min    Activity Tolerance Patient tolerated treatment well;No increased pain    Behavior During Therapy [facility] for tasks assessed/performed              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   Qualifier: Diagnosis of  By: Debby Bud MD, Rosalyn Gess    Sleep apnea    Type II diabetes mellitus with renal manifestations, uncontrolled 08/03/2007   Qualifier: Diagnosis of  By: Debby Bud MD, Rosalyn Gess  Med metformin    Past Surgical History:  Procedure Laterality Date   CARDIOVERSION N/A 07/18/2019   Procedure: CARDIOVERSION;  Surgeon: Lars Masson, MD;  Location: [facility] ENDOSCOPY;  Service: Cardiovascular;  Laterality: N/A;   IRRIGATION AND DEBRIDEMENT KNEE Right 02/18/2023   Procedure: RIGHT KNEE ARTHROSCOPIC IRRIGATION AND DEBRIDEMENT;  Surgeon: Tarry Kos, MD;  Location: [facility] ORS;  Service: Orthopedics;  Laterality: Right;   TEE WITHOUT CARDIOVERSION N/A 07/18/2019   Procedure: TRANSESOPHAGEAL ECHOCARDIOGRAM (TEE);  Surgeon: Lars Masson, MD;  Location: [facility] ENDOSCOPY;  Service: Cardiovascular;   Laterality: N/A;   TOOTH EXTRACTION     XI ROBOTIC ASSISTED SIMPLE PROSTATECTOMY N/A 05/31/2020   Procedure: XI ROBOTIC ASSISTED SIMPLE PROSTATECTOMY-SUPRAPUBIC;  Surgeon: Malen Gauze, MD;  Location: [facility] ORS;  Service: Urology;  Laterality: N/A;   Patient Active Problem List   Diagnosis Date Noted   Rheumatoid factor positive 02/10/2023   Knee pain, right 01/21/2023   Hyperlipidemia 01/20/2023   Effusion, right knee 01/20/2023   Hypercoagulable state due to typical atrial flutter (HCC) 01/13/2023   Venous stasis of lower extremity 01/09/2023   BPH with obstruction/lower urinary tract symptoms 05/31/2020   Paroxysmal atrial flutter (HCC)    Adrenal nodule (HCC) 03/28/2020   OSA (obstructive sleep apnea) 03/28/2020   Morbid obesity (HCC) 03/28/2020   Medication noncompliance due to cognitive impairment 03/28/2020   Chronic systolic CHF (congestive heart failure) (HCC) 03/28/2020   CKD (chronic kidney disease) stage 4, GFR 15-29 ml/min (HCC) 06/10/2017   PSORIASIS, 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   Type 2 diabetes with complication (HCC) 08/03/2007   Hyperlipidemia associated with type 2 diabetes mellitus (HCC) 08/03/2007   Essential hypertension 08/03/2007    PCP: Myrlene Broker, MD  REFERRING PROVIDER: Tarry Kos, MD   REFERRING DIAG: [phone] (ICD-10-CM) - Effusion, right knee   THERAPY DIAG:  Muscle weakness (generalized)  Stiffness of left knee, not elsewhere classified  Other abnormalities of gait and mobility  Rationale for Evaluation and Treatment: Rehabilitation  ONSET DATE: Right knee scope, irrigation and debriement, major synovectomy 02/18/2023  SUBJECTIVE:   SUBJECTIVE STATEMENT: He has been doing his exercises as advised.  He has walked some in house without knee immobilizer with walker. He put the pillows in bed as PT recommended and he was able to sleep thru the night.   PERTINENT HISTORY: Right knee scope 02/18/2023 PMH:RA,OSA,Obesity,CHF,CKD,DM2 To  wear knee immobilizer until quad function improves  PAIN:  NPRS scale:   0/10 upon arrival with walking into clinic & since last PT session up to 4/10 Pain location: Rt knee anterior Pain description: pulling Aggravating factors: sitting 90 90 deg without leg supported, walking Relieving factors: resting with leg supported and turning foot out some   PRECAUTIONS: None  RED FLAGS: None   WEIGHT BEARING RESTRICTIONS: No  FALLS:  Has patient fallen in last 6 months? 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  OBJECTIVE:  Note: Objective measures were completed at Evaluation unless otherwise noted.  DIAGNOSTIC FINDINGS:  XR 01/05/23 IMPRESSION: Mild osteoarthritis with moderate knee joint effusion.  PATIENT SURVEYS:  FOTO 57% functional goal is 71%  COGNITION: Overall cognitive status: Within functional limits for tasks assessed      EDEMA:  Moderate edema noted in Rt knee and Lower leg   LOWER EXTREMITY ROM:   AROM/PROM Right eval Right 03/13/2023 Right 03/19/23  Hip flexion     Hip extension     Hip abduction     Hip adduction     Hip internal rotation     Hip external rotation     Knee flexion 60/80 Passive 97   Knee extension 15/10 Passive lacks 10 Supine P: -8* A: quad set -10*  Ankle dorsiflexion     Ankle plantarflexion     Ankle inversion     Ankle eversion      (Blank rows = not tested)  LOWER EXTREMITY MMT:  MMT Right eval Left eval  Hip flexion 2   Hip extension    Hip abduction    Hip adduction    Hip internal rotation    Hip external rotation    Knee flexion 3   Knee extension 2   Ankle dorsiflexion    Ankle plantarflexion     Ankle inversion    Ankle eversion     (Blank rows = not tested)  GAIT: Eval Comments: Currently using RW and in knee immobilizer druing ambulation. He did not use AD PLOF   TODAY'S TREATMENT:  03/16/2023: Therapeutic Exercise: Nustep seat 15 level 5 with BLEs & BUEs for 4 min, then BLEs only for 4 min Leg press BLEs 75# 10 reps 2 sets Hamstring stretch with strap & manual assist of PT 30 sec hold 2 reps Prone hang 2 minutes Prone knee flexion and active knee ext end range pulling foot towards floor 5 sec 10 reps PT added above 2 to HEP with HO & verbal cues.  Pt verbalized and return demo understanding.   Therapeutic Activities: Pt amb 40' X 3 with RW without knee immobilizer with CGA. No knee instability noted today. Sitting on 29" mat table simulating  bar stool with cues for ADLs that he normally would stand to perform. Sit to stand 10 reps without UE assist from 29" ht cues for equal LE use / RW close for safety but no touch needed. Pt verbalized understanding PT recommendation.  Pt requires assistance by PT or his LLE to move RLE in / out of bed.   Manual Therapy: PROM overpressure and Grade IV for ext and flex  Vasopneumatic: Medium right knee 10 minutes 34* with elevation alphabet 2 sets    TREATMENT:  03/16/2023: Therapeutic Exercise: Nustep seat 15 level 5 with BLEs & BUEs for 6 min, then BLEs only for 2 min Seated heel slide with foot on pillow case: ext / quad set and active knee flexion end range stretch with LLE assist followed by isometric hamstring  10 reps 2 sets Hamstring stretch seated with strap 30 sec hold 2 reps  Following exercises until PT noted muscle fatigue: Supine quad set with ankle on towel roll with PT tactile cues for VMO 3 sec hold 6 reps 2 sets Supine RLE SAQ PT assisted - tactile cues for VMO prior to concentric, reset max ext and controlled eccentric 5 reps prior to fatigue Sidelying manual assist closed chain leg press 6 reps prior to  fatigue  Therapeutic Activities: Pt amb 40' with RW without knee immobilizer with CGA. Pt excessive UE support on RW to control knee instability with one buckling controlled by UEs.   PT cued on using pillows in bed for positioning and "tenting" sheets off feet.  Pt verbalized understanding. Pt requires assistance by PT or his LLE to move RLE in / out of bed.   Manual Therapy: PROM with overpressure for knee ext  Vasopneumatic: Medium right knee 10 minutes 34* with elevation   03/13/2023 Supine knee flexion with strap 2 sets of 10 for 5 seconds Supine quadriceps sets with heel prop 2 sets of 10 for 5 seconds Straight leg raise with help from strap 10 x 10 seconds Seated knee flexion AAROM (left pushes right into flexion) 10 x 5 seconds Seated LAQ, attempted and held due to knee pain Seated marching 10 x  Vaso Medium right knee 10 minutes (update 1 change to HEP and review while on vaso)   Eval HEP creation and review with demonstration and trial set preformed, see below for details -Vasopnuematic device X 10 min, medium compression, 34 deg to Rt knee in elevation   PATIENT EDUCATION: Education details: HEP, PT plan of care Person educated: Patient Education method: Explanation, Demonstration, Verbal cues, and Handouts Education comprehension: verbalized understanding and needs further education   HOME EXERCISE PROGRAM: Access Code: [facility] URL: [email or web address] Date: 03/19/2023 Prepared by: [name] Faster  Exercises - Supine Quad Set  - 2 x daily - 6 x weekly - 2-3 sets - 10 reps - 5 sec hold - Supine Heel Slide with Strap  - 2 x daily - 6 x weekly - 2-3 sets - 10 reps - 5 hold - straight leg raise with help from strap  - 2 x daily - 6 x weekly - 1-2 sets - 10 reps - 10 seconds hold - Seated March  - 2 x daily - 6 x weekly - 2-3 sets - 10 reps - Seated Long Arc Quad  - 2 x daily - 6 x weekly - 2-3 sets - 10 reps - 3 hold - Long Sitting Quad Set with  Towel Roll Under Heel  - 5 x daily - 7 x weekly -  2 sets - 10 reps - 5 seconds hold - Prone Knee Extension with Ankle Weight  - 2-3 x daily - 7 x weekly - 1 sets - 2-3 reps - 2 minutes hold - Prone Knee Flexion  - 2-3 x daily - 7 x weekly - 2-3 sets - 10 reps - 5 seconds hold - Prone Knee Extension Overpressure  - 2-3 x daily - 7 x weekly - 2-3 sets - 10 reps - 5 seconds hold   ASSESSMENT:  CLINICAL IMPRESSION:   Patient had better knee control with gait without knee immobilizer with RW.  Patient is able to engage quads better today but fatigues quickly.  Patient continues to benefit from skilled PT.    OBJECTIVE IMPAIRMENTS: decreased activity tolerance, difficulty walking, decreased balance, decreased endurance, decreased mobility, decreased ROM, decreased strength, impaired flexibility, impaired LE use, and pain.  ACTIVITY LIMITATIONS: bending, lifting, carry, locomotion, cleaning, community activity, driving, and  occupation  PERSONAL FACTORS: see above PMH are also affecting patient's functional outcome.  REHAB POTENTIAL: Good  CLINICAL DECISION MAKING: Stable/uncomplicated  EVALUATION COMPLEXITY: Low    GOALS: Short term PT Goals Target date: 04/06/2023   Pt will be I and compliant with HEP. Baseline:  Goal status: Ongoing 03/16/2023   Long term PT goals Target date:06/01/2023   Pt will improve Rt knee ROM to [facility] (3-115 deg) to improve functional mobility Baseline: Goal status: Ongoing 03/16/2023 Pt will improve Rt hip/knee strength to at least 4+/5 MMT to improve functional strength Baseline: Goal status: Ongoing 03/16/2023 Pt will improve FOTO to at least 71% functional to show improved function Baseline: Goal status: Ongoing 03/16/2023 Pt will reduce pain to overall less than 2-3/10 with usual activity and feel ready to return to work activity. Baseline: Goal status: Ongoing 03/16/2023 Pt will be able to ambulate community distances at least 1000 ft WNL gait  pattern without AD Baseline: Goal status: Ongoing 03/16/2023  PLAN: PT FREQUENCY: 1-3 times per week   PT DURATION: 6-12 weeks  PLANNED INTERVENTIONS (unless contraindicated): aquatic PT, Canalith repositioning, cryotherapy, Electrical stimulation, Iontophoresis with 4 mg/ml dexamethasome, Moist heat, traction, Ultrasound, gait training, Therapeutic exercise, balance training, neuromuscular re-education, patient/family education, prosthetic training, manual techniques, passive ROM, dry needling, taping, vasopnuematic device, vestibular, spinal manipulations, joint manipulations 97110-Therapeutic exercises, 97530- Therapeutic activity, 97112- Neuromuscular re-education, 97535- Self Care, and [id number]- Manual therapy  PLAN FOR NEXT SESSION:  continue gait without knee immobilizer. Exercises for Quad activation focus and knee ROM.   [name] Faster, PT, DPT 03/19/2023, 12:53 PM

## 2023-03-24 ENCOUNTER — Ambulatory Visit (INDEPENDENT_AMBULATORY_CARE_PROVIDER_SITE_OTHER): Payer: BC Managed Care – PPO | Admitting: Physical Therapy

## 2023-03-24 ENCOUNTER — Encounter: Payer: Self-pay | Admitting: Internal Medicine

## 2023-03-24 ENCOUNTER — Encounter: Payer: Self-pay | Admitting: Orthopaedic Surgery

## 2023-03-24 ENCOUNTER — Ambulatory Visit (INDEPENDENT_AMBULATORY_CARE_PROVIDER_SITE_OTHER): Payer: BC Managed Care – PPO | Admitting: Internal Medicine

## 2023-03-24 ENCOUNTER — Ambulatory Visit (INDEPENDENT_AMBULATORY_CARE_PROVIDER_SITE_OTHER): Payer: BC Managed Care – PPO | Admitting: Orthopaedic Surgery

## 2023-03-24 ENCOUNTER — Encounter: Payer: Self-pay | Admitting: Physical Therapy

## 2023-03-24 VITALS — BP 130/72 | HR 67 | Ht 77.0 in | Wt 274.0 lb

## 2023-03-24 DIAGNOSIS — Z7985 Long-term (current) use of injectable non-insulin antidiabetic drugs: Secondary | ICD-10-CM

## 2023-03-24 DIAGNOSIS — M6281 Muscle weakness (generalized): Secondary | ICD-10-CM

## 2023-03-24 DIAGNOSIS — R2689 Other abnormalities of gait and mobility: Secondary | ICD-10-CM

## 2023-03-24 DIAGNOSIS — D3502 Benign neoplasm of left adrenal gland: Secondary | ICD-10-CM

## 2023-03-24 DIAGNOSIS — E1122 Type 2 diabetes mellitus with diabetic chronic kidney disease: Secondary | ICD-10-CM | POA: Diagnosis not present

## 2023-03-24 DIAGNOSIS — E1142 Type 2 diabetes mellitus with diabetic polyneuropathy: Secondary | ICD-10-CM

## 2023-03-24 DIAGNOSIS — E1165 Type 2 diabetes mellitus with hyperglycemia: Secondary | ICD-10-CM | POA: Diagnosis not present

## 2023-03-24 DIAGNOSIS — M25662 Stiffness of left knee, not elsewhere classified: Secondary | ICD-10-CM

## 2023-03-24 DIAGNOSIS — Z7984 Long term (current) use of oral hypoglycemic drugs: Secondary | ICD-10-CM

## 2023-03-24 DIAGNOSIS — D3501 Benign neoplasm of right adrenal gland: Secondary | ICD-10-CM

## 2023-03-24 DIAGNOSIS — N1831 Chronic kidney disease, stage 3a: Secondary | ICD-10-CM

## 2023-03-24 DIAGNOSIS — M25461 Effusion, right knee: Secondary | ICD-10-CM

## 2023-03-24 DIAGNOSIS — R600 Localized edema: Secondary | ICD-10-CM

## 2023-03-24 LAB — POCT GLYCOSYLATED HEMOGLOBIN (HGB A1C): Hemoglobin A1C: 7.3 % — AB (ref 4.0–5.6)

## 2023-03-24 MED ORDER — TIRZEPATIDE 5 MG/0.5ML ~~LOC~~ SOAJ: 5.0000 mg | SUBCUTANEOUS | Status: DC

## 2023-03-24 NOTE — Patient Instructions (Addendum)
-   Restart Mounjaro 5 mg  once week ( Hold a week before heart surgery )  -Continue  Glipizide 5 mg, 1 tablet before Breakfast and 1 tablet before Supper      - HOW TO TREAT LOW BLOOD SUGARS (Blood sugar LESS THAN 70 MG/DL) Please follow the RULE OF 15 for the treatment of hypoglycemia treatment (when your (blood sugars are less than 70 mg/dL)   STEP 1: Take 15 grams of carbohydrates when your blood sugar is low, which includes:  3-4 GLUCOSE TABS  OR 3-4 OZ OF JUICE OR REGULAR SODA OR ONE TUBE OF GLUCOSE GEL    STEP 2: RECHECK blood sugar in 15 MINUTES STEP 3: If your blood sugar is still low at the 15 minute recheck --> then, go back to STEP 1 and treat AGAIN with another 15 grams of carbohydrates.

## 2023-03-24 NOTE — Progress Notes (Signed)
Name: Johnny [name]  Age/ Sex: 67 y.o., male   MRN/ DOB: [id number], 03/21/1956     PCP: Myrlene Broker, MD   Reason for Endocrinology Evaluation: Type 2 Diabetes Mellitus  Initial Endocrine Consultative Visit: 08/20/2018    PATIENT IDENTIFIER: Johnny [name] is a 66 y.o. male with a past medical history of T2DM, psoriasis, HTN and OSA (Dx 08/2019) . The patient has followed with Endocrinology clinic since 08/20/2018 for consultative assistance with management of his diabetes.     DIABETIC HISTORY:  Mr. Rohlik was diagnosed with T2DM in 2012,. He was on Metformin,Farxiga and Glimepiride in the past. He has never been on insulin. His hemoglobin A1c has ranged from 9.0 % in 2013, peaking at 13.5% in 06/2018.  On his initial visit to our clinic his A1c was 13.5%. He was not taking any of his meds. He was restarted on Metformin and glipizide.   Trulicity was started 02/2020  Stop metformin due to low GFR 05/2022  Switch Trulicity to [facility] 11/2022  ADRENAL HISTORY: Upon review of his chart he was noted to have bilateral adrenal nodules on CT abdomen and pelvis from 03/2020.  Right adrenal nodule 3.3 cm and left adrenal nodule 2.8 cm  He did have normal renin and Aldo during evaluation for uncontrolled HTN in 09/2018   SUBJECTIVE:   During the last visit (12/01/2022): A1c 9.8 %.      Today (03/24/2023): [name] is here for a  follow up on his diabetes.  He checks his blood sugars 2 times daily, preprandial to breakfast and supper. The patient has not hypoglycemic episodes since the last clinic visit.    Patient presented to the ED 01/2023 with A-fib, cardioverted, he was started on amiodarone 03/2023 He presented again with knee pain/effusion on 01/20/2023, s/p knee drainage and glucocorticoid injection He subsequently underwent arthroscopic major synovectomy, chondroplasty of the right knee 02/18/2023  Follows with Podiatry - Dr. Allena Katz , last follow-up  01/28/2023   Continues to follow-up with cardiology for A-fib, OSA, and HTN 05/2022  Patient has been noted with weight loss since his last visit here He has been without mounjaro and Jardiance for a month  Denies nausea or vomiting  Denies constipation or diarrhea   Scheduled for loop recorder insertion , and A. Flutter ablation    HOME DIABETES REGIMEN:  Glipizide 5 mg,  1 tablet BID  Jardiance 25 mg daily - Mounjaro 5 mg weekly     METER DOWNLOAD SUMMARY:10/21-11/19/2024  Average Number Tests/Day = 11 Overall Mean FS Glucose = 110 Standard Deviation = 23  BG Ranges: Low = 84 High = 154   Hypoglycemic Events/30 Days: BG < 50 = 0 Episodes of symptomatic severe hypoglycemia = 0     DIABETIC COMPLICATIONS: Microvascular complications:  Neuropathy, CKD Denies: retinopathy  Last eye exam: Completed 05/2020   Macrovascular complications:    Denies: CAD, PVD, CVA     HISTORY:  Past Medical History:  Past Medical History:  Diagnosis Date   Arthritis    CKD (chronic kidney disease) stage 3, GFR 30-59 ml/min (HCC) 06/10/2017   Constipation 03/27/2020   Diabetes mellitus    Dysrhythmia    A flutter   Essential hypertension 08/03/2007   Qualifier: Diagnosis of  By: Debby Bud MD, Rosalyn Gess  Med ARB, diuretic     Guttate psoriasis    Hyperlipidemia associated with type 2 diabetes mellitus (HCC) 08/03/2007   Qualifier: Diagnosis of  ByDebby Bud MD,  Casimiro Needle E  meds - none    Paroxysmal atrial flutter (HCC)    S/P TEE/DCCV 07/2019   PSORIASIS, 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   Qualifier: Diagnosis of  By: Debby Bud MD, Rosalyn Gess    Sleep apnea    Type II diabetes mellitus with renal manifestations, uncontrolled 08/03/2007   Qualifier: Diagnosis of  By: Debby Bud MD, Rosalyn Gess  Med metformin    Past Surgical History:  Past Surgical History:  Procedure Laterality Date   CARDIOVERSION N/A 07/18/2019   Procedure: CARDIOVERSION;  Surgeon: Lars Masson, MD;  Location: [facility] ENDOSCOPY;   Service: Cardiovascular;  Laterality: N/A;   IRRIGATION AND DEBRIDEMENT KNEE Right 02/18/2023   Procedure: RIGHT KNEE ARTHROSCOPIC IRRIGATION AND DEBRIDEMENT;  Surgeon: Tarry Kos, MD;  Location: [facility] ORS;  Service: Orthopedics;  Laterality: Right;   TEE WITHOUT CARDIOVERSION N/A 07/18/2019   Procedure: TRANSESOPHAGEAL ECHOCARDIOGRAM (TEE);  Surgeon: Lars Masson, MD;  Location: [facility] ENDOSCOPY;  Service: Cardiovascular;  Laterality: N/A;   TOOTH EXTRACTION     XI ROBOTIC ASSISTED SIMPLE PROSTATECTOMY N/A 05/31/2020   Procedure: XI ROBOTIC ASSISTED SIMPLE PROSTATECTOMY-SUPRAPUBIC;  Surgeon: Malen Gauze, MD;  Location: [facility] ORS;  Service: Urology;  Laterality: N/A;   Social History:  reports that he has never smoked. He has been exposed to tobacco smoke. He has never used smokeless tobacco. He reports that he does not drink alcohol and does not use drugs. Family History:  Family History  Problem Relation Age of Onset   Memory loss Mother        DOB 61   Kidney disease Mother        s/p nephrectomy, infection problem   Dementia Mother    Hypertension Father        DOB [id number]   Hyperlipidemia Father    Diabetes Father    Coronary artery disease Father        with stents   Nephrolithiasis Father    Kidney disease Father        s/p rejected kidney transplant; on HD   Cancer Brother        lung   HIV Brother    Prostate cancer Neg Hx    Colon cancer Neg Hx      HOME MEDICATIONS: Allergies as of 03/24/2023   No Known Allergies      Medication List        Accurate as of March 24, 2023  8:24 AM. If you have any questions, ask your nurse or doctor.          amiodarone 200 MG tablet Commonly known as: PACERONE Take 1 tablet (200 mg total) by mouth 2 (two) times daily for 7 days, THEN 1 tablet (200 mg total) daily. Start taking on: February 02, 2023   atorvastatin 80 MG tablet Commonly known as: LIPITOR Take 80 mg by mouth daily.   carvedilol 25 MG  tablet Commonly known as: COREG Take 1 tablet (25 mg total) by mouth 2 (two) times daily with a meal.   diclofenac Sodium 1 % Gel Commonly known as: VOLTAREN Apply 2 g topically 4 (four) times daily. What changed:  when to take this reasons to take this   Eliquis 5 MG Tabs tablet Generic drug: apixaban TAKE 1 TABLET BY MOUTH TWICE A DAY   finasteride 5 MG tablet Commonly known as: PROSCAR TAKE 1 TABLET (5 MG TOTAL) BY MOUTH DAILY.   glipiZIDE 5 MG tablet Commonly known as: GLUCOTROL Take 1 tablet (5 mg total)  by mouth 2 (two) times daily before a meal.   hydrALAZINE 50 MG tablet Commonly known as: APRESOLINE TAKE 1 TABLET BY MOUTH THREE TIMES A DAY   isosorbide mononitrate 60 MG 24 hr tablet Commonly known as: IMDUR TAKE 1 TABLET BY MOUTH EVERY DAY   oxyCODONE-acetaminophen 5-325 MG tablet Commonly known as: Percocet Take 1-2 tablets by mouth 2 (two) times daily as needed for severe pain (pain score 7-10).         OBJECTIVE:   Vital Signs: BP 130/72 (BP Location: Left Arm, Patient Position: Sitting, Cuff Size: Large)   Pulse 67   Ht 6\' 5"  (1.956 m)   Wt 274 lb (124.3 kg)   SpO2 98%   BMI 32.49 kg/m   Wt Readings from Last 3 Encounters:  03/24/23 274 lb (124.3 kg)  03/17/23 275 lb (124.7 kg)  02/18/23 284 lb 6.3 oz (129 kg)     Exam: General: Pt appears well and is in NAD  Lungs: Clear with good BS bilat   Heart: RRR   Neuro: MS is good with appropriate affect, pt is alert and Ox3    DM foot exam: 01/28/2023 per podiatry   DATA REVIEWED:  Lab Results  Component Value Date   HGBA1C 7.3 (A) 03/24/2023   HGBA1C 9.8 (A) 12/01/2022   HGBA1C 7.8 (A) 05/27/2022    Latest Reference Range & Units 02/09/23 15:51  Sodium 135 - 145 mEq/L 139  Potassium 3.5 - 5.1 mEq/L 3.9  Chloride 96 - 112 mEq/L 105  CO2 19 - 32 mEq/L 23  Glucose 70 - 99 mg/dL 696 (H)  BUN 6 - 23 mg/dL 19  Creatinine 2.95 - 2.84 mg/dL 1.32 (H)  Calcium 8.4 - 10.5 mg/dL 8.8   Phosphorus 2.3 - 4.6 mg/dL 2.4  Albumin 3.5 - 5.2 g/dL 3.0 (L)        MR abdomen 07/03/2022  FINDINGS: Lower chest: No acute abnormality.   Hepatobiliary: No solid liver abnormality is seen. No gallstones, gallbladder wall thickening, or biliary dilatation.   Pancreas: Unremarkable. No pancreatic ductal dilatation or surrounding inflammatory changes.   Spleen: Normal in size without significant abnormality.   Adrenals/Urinary Tract: Unchanged, definitively benign bilateral macroscopic fat containing adrenal nodules, measuring 3.3 x 2.5 cm on the right and 2.3 x 1.8 cm on the left (series 8, image 16). Kidneys are normal, without renal calculi, solid lesion, or hydronephrosis.   Stomach/Bowel: Stomach is within normal limits. No evidence of bowel wall thickening, distention, or inflammatory changes.   Vascular/Lymphatic: No significant vascular findings are present. No enlarged abdominal lymph nodes.   Other: No abdominal wall hernia or abnormality. No ascites.   Musculoskeletal: No acute or significant osseous findings.   IMPRESSION: Unchanged, definitively benign bilateral macroscopic fat containing adrenal nodules, for which no further follow-up or characterization is required.      ASSESSMENT / PLAN / RECOMMENDATIONS:   1) Type 2 Diabetes Mellitus, Sub-Optimally   controlled, With CKD III and neuropathic  complications - Most recent A1c of 7.3 %. Goal A1c < 7.0 %.  -A1c has trended down -Since his discharge a month ago after knee surgery the patient has only been on glipizide, there was no reconciliation of Mounjaro nor Jardiance -I did recommend restarting Mounjaro and Jardiance, discussed cardiovascular benefits of both medications -We opted to restart him on Mounjaro at this time and continue glipizide -Will consider restarting Jardiance in the future -He is scheduled for a flutter ablation and a loop recorder insertion  on April 30, 2023, patient advised  to hold [facility] a week prior to the procedure -Metformin was discontinued 05/2022 due to low GFR - MEDICATIONS:  Continue Glipizide  5 mg, 1 tablet before Breakfast and 1 tablet before supper Restart Mounjaro 5 mg weekly   EDUCATION / INSTRUCTIONS: BG monitoring instructions: Patient is instructed to check his blood sugars 2 times a day, fasting and bedtime. Call [facility] if: BG persistently < 70  I reviewed the Rule of 15 for the treatment of hypoglycemia in detail with the patient. Literature supplied.   2.  Bilateral adrenal nodules:   -Work up negative 06/2021 and 05/2022 including renin, Aldo, 24-hour urinary cortisol and catecholamines -Adrenal imaging 06/2022 showed stability     3. Microalbuminuria :  -Resolved -Continue olmesartan-HCTZ     F/U in 4 months    I spent 25 minutes preparing to see the patient by review of recent labs, imaging and procedures, obtaining and reviewing separately obtained history, communicating with the patient/family or caregiver, ordering medications, tests or procedures, and documenting clinical information in the EHR including the differential Dx, treatment, and any further evaluation and other management    Signed electronically by: Lyndle Herrlich, MD  [facility] Endocrinology  [facility] [address], [address] [place], [place] [address] Phone: [phone] FAX: [phone]   CC: Myrlene Broker, MD [address] [place] [address] Phone: [phone]  Fax: [phone]  Return to Endocrinology clinic as below: Future Appointments  Date Time Provider Department Center  03/24/2023  3:15 PM Tarry Kos, MD [facility] None  03/24/2023  4:00 PM Ivery Quale R, PT [facility] None  03/26/2023  3:15 PM Barbaraann Rondo, PT [facility] None  03/30/2023  4:00 PM April Manson, PT [facility] None  04/01/2023  2:30 PM Barbaraann Rondo, PT [facility] None  04/06/2023  4:00 PM April Manson, PT [facility] None  04/08/2023  4:00 PM April Manson, PT [facility] None  05/13/2023  3:45 PM Candelaria Stagers, DPM [facility]  05/25/2023  3:30 PM McKenzie, Mardene Celeste, MD [facility] None  07/09/2023  8:20 AM Pollyann Savoy, MD [facility] None  08/12/2023 11:00 AM Pollyann Savoy, MD [facility] None

## 2023-03-24 NOTE — Therapy (Signed)
OUTPATIENT PHYSICAL THERAPY LOWER EXTREMITY TREATMENT   Patient Name: Johnny [name] MRN: [id number] DOB:1955/10/30, 67 y.o., male Today's Date: 03/24/2023  END OF SESSION:  PT End of Session - 03/24/23 1608     Visit Number 5    Number of Visits 20    Date for PT Re-Evaluation 06/01/23    PT Start Time 1555    PT Stop Time 1643    PT Time Calculation (min) 48 min    Activity Tolerance Patient tolerated treatment well;No increased pain    Behavior During Therapy [facility] for tasks assessed/performed              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   Qualifier: Diagnosis of  By: Debby Bud MD, Rosalyn Gess    Sleep apnea    Type II diabetes mellitus with renal manifestations, uncontrolled 08/03/2007   Qualifier: Diagnosis of  By: Debby Bud MD, Rosalyn Gess  Med metformin    Past Surgical History:  Procedure Laterality Date   CARDIOVERSION N/A 07/18/2019   Procedure: CARDIOVERSION;  Surgeon: Lars Masson, MD;  Location: [facility] ENDOSCOPY;  Service: Cardiovascular;  Laterality: N/A;   IRRIGATION AND DEBRIDEMENT KNEE Right 02/18/2023   Procedure: RIGHT KNEE ARTHROSCOPIC IRRIGATION AND DEBRIDEMENT;  Surgeon: Tarry Kos, MD;  Location: [facility] ORS;  Service: Orthopedics;  Laterality: Right;   TEE WITHOUT CARDIOVERSION N/A 07/18/2019   Procedure: TRANSESOPHAGEAL ECHOCARDIOGRAM (TEE);  Surgeon: Lars Masson, MD;  Location: [facility] ENDOSCOPY;  Service: Cardiovascular;   Laterality: N/A;   TOOTH EXTRACTION     XI ROBOTIC ASSISTED SIMPLE PROSTATECTOMY N/A 05/31/2020   Procedure: XI ROBOTIC ASSISTED SIMPLE PROSTATECTOMY-SUPRAPUBIC;  Surgeon: Malen Gauze, MD;  Location: [facility] ORS;  Service: Urology;  Laterality: N/A;   Patient Active Problem List   Diagnosis Date Noted   Rheumatoid factor positive 02/10/2023   Knee pain, right 01/21/2023   Hyperlipidemia 01/20/2023   Effusion, right knee 01/20/2023   Hypercoagulable state due to typical atrial flutter (HCC) 01/13/2023   Venous stasis of lower extremity 01/09/2023   BPH with obstruction/lower urinary tract symptoms 05/31/2020   Paroxysmal atrial flutter (HCC)    Adrenal nodule (HCC) 03/28/2020   OSA (obstructive sleep apnea) 03/28/2020   Morbid obesity (HCC) 03/28/2020   Medication noncompliance due to cognitive impairment 03/28/2020   Chronic systolic CHF (congestive heart failure) (HCC) 03/28/2020   CKD (chronic kidney disease) stage 4, GFR 15-29 ml/min (HCC) 06/10/2017   PSORIASIS, 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   Type 2 diabetes with complication (HCC) 08/03/2007   Hyperlipidemia associated with type 2 diabetes mellitus (HCC) 08/03/2007   Essential hypertension 08/03/2007    PCP: Myrlene Broker, MD  REFERRING PROVIDER: Tarry Kos, MD   REFERRING DIAG: [phone] (ICD-10-CM) - Effusion, right knee   THERAPY DIAG:  Muscle weakness (generalized)  Stiffness of left knee, not elsewhere classified  Other abnormalities of gait and mobility  Localized edema  Rationale for Evaluation and Treatment: Rehabilitation  ONSET DATE: Right knee scope, irrigation and debriement, major synovectomy 02/18/2023  SUBJECTIVE:   SUBJECTIVE STATEMENT: He is not having much pain upon arrival. .   PERTINENT HISTORY: Right knee scope 02/18/2023 PMH:RA,OSA,Obesity,CHF,CKD,DM2 To wear knee immobilizer until quad function improves  PAIN:  NPRS scale:   0/10 upon arrival with walking into clinic & since last  PT session up to 4/10 Pain location: Rt knee anterior Pain description: pulling Aggravating factors: sitting 90 90 deg without leg supported, walking Relieving factors: resting with leg supported and turning foot out some   PRECAUTIONS: None  RED FLAGS: None   WEIGHT BEARING RESTRICTIONS: No  FALLS:  Has patient fallen in last 6 months? 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  OBJECTIVE:  Note: Objective measures were completed at Evaluation unless otherwise noted.  DIAGNOSTIC FINDINGS:  XR 01/05/23 IMPRESSION: Mild osteoarthritis with moderate knee joint effusion.  PATIENT SURVEYS:  FOTO 57% functional goal is 71%  COGNITION: Overall cognitive status: Within functional limits for tasks assessed      EDEMA:  Moderate edema noted in Rt knee and Lower leg   LOWER EXTREMITY ROM:   AROM/PROM Right eval Right 03/13/2023 Right 03/19/23  Hip flexion     Hip extension     Hip abduction     Hip adduction     Hip internal rotation     Hip external rotation     Knee flexion 60/80 Passive 97   Knee extension 15/10 Passive lacks 10 Supine P: -8* A: quad set -10*  Ankle dorsiflexion     Ankle plantarflexion     Ankle inversion     Ankle eversion      (Blank rows = not tested)  LOWER EXTREMITY MMT:  MMT Right eval Left eval  Hip flexion 2   Hip extension    Hip abduction    Hip adduction    Hip internal rotation    Hip external rotation    Knee flexion 3   Knee extension 2   Ankle dorsiflexion    Ankle plantarflexion    Ankle inversion    Ankle eversion     (Blank rows = not tested)  GAIT: Eval Comments: Currently using RW and in knee immobilizer  druing ambulation. He did not use AD PLOF   TODAY'S TREATMENT:  03/24/2023: Therapeutic Exercise: Nustep seat 15 level 7 with BLEs only for 8 min Leg press BLEs 75# 10 reps 2 sets, then Rt leg only 25# 2X10 Step ups with bilat UE support 2 inch step X 10 leading with Rt leg  NMES russian Estim 4 pads to quads with co-contract mode, 5/5 sec on/off with quad sets X 4 min and LAQ X 4 min  -Vasopnuematic device X 10 min, medium compression, 34 deg to Rt knee    03/16/2023: Therapeutic Exercise: Nustep seat 15 level 5 with BLEs & BUEs for 4 min, then BLEs only for 4 min Leg press BLEs 75# 10 reps 2 sets Hamstring stretch with strap & manual assist of PT 30 sec hold 2 reps Prone hang 2 minutes Prone knee flexion and  active knee ext end range pulling foot towards floor 5 sec 10 reps PT added above 2 to HEP with HO & verbal cues.  Pt verbalized and return demo understanding.   Therapeutic Activities: Pt amb 40' X 3 with RW without knee immobilizer with CGA. No knee instability noted today. Sitting on 29" mat table simulating bar stool with cues for ADLs that he normally would stand to perform. Sit to stand 10 reps without UE assist from 29" ht cues for equal LE use / RW close for safety but no touch needed. Pt verbalized understanding PT recommendation.  Pt requires assistance by PT or his LLE to move RLE in / out of bed.   Manual Therapy: PROM overpressure and Grade IV for ext and flex  Vasopneumatic: Medium right knee 10 minutes 34* with elevation alphabet 2 sets    TREATMENT:  03/16/2023: Therapeutic Exercise: Nustep seat 15 level 5 with BLEs & BUEs for 6 min, then BLEs only for 2 min Seated heel slide with foot on pillow case: ext / quad set and active knee flexion end range stretch with LLE assist followed by isometric hamstring  10 reps 2 sets Hamstring stretch seated with strap 30 sec hold 2 reps  Following exercises until PT noted muscle fatigue: Supine quad set with  ankle on towel roll with PT tactile cues for VMO 3 sec hold 6 reps 2 sets Supine RLE SAQ PT assisted - tactile cues for VMO prior to concentric, reset max ext and controlled eccentric 5 reps prior to fatigue Sidelying manual assist closed chain leg press 6 reps prior to fatigue  Therapeutic Activities: Pt amb 40' with RW without knee immobilizer with CGA. Pt excessive UE support on RW to control knee instability with one buckling controlled by UEs.   PT cued on using pillows in bed for positioning and "tenting" sheets off feet.  Pt verbalized understanding. Pt requires assistance by PT or his LLE to move RLE in / out of bed.   Manual Therapy: PROM with overpressure for knee ext  Vasopneumatic: Medium right knee 10 minutes 34* with elevation   03/13/2023 Supine knee flexion with strap 2 sets of 10 for 5 seconds Supine quadriceps sets with heel prop 2 sets of 10 for 5 seconds Straight leg raise with help from strap 10 x 10 seconds Seated knee flexion AAROM (left pushes right into flexion) 10 x 5 seconds Seated LAQ, attempted and held due to knee pain Seated marching 10 x  Vaso Medium right knee 10 minutes (update 1 change to HEP and review while on vaso)   Eval HEP creation and review with demonstration and trial set preformed, see below for details -Vasopnuematic device X 10 min, medium compression, 34 deg to Rt knee in elevation   PATIENT EDUCATION: Education details: HEP, PT plan of care Person educated: Patient Education method: Explanation, Demonstration, Verbal cues, and Handouts Education comprehension: verbalized understanding and needs further education   HOME EXERCISE PROGRAM: Access Code: [facility] URL: [email or web address] Date: 03/19/2023 Prepared by: [name] Faster  Exercises - Supine Quad Set  - 2 x daily - 6 x weekly - 2-3 sets - 10 reps - 5 sec hold - Supine Heel Slide with Strap  - 2 x daily - 6 x weekly - 2-3 sets - 10 reps - 5 hold -  straight leg raise with help from strap  - 2 x daily - 6 x weekly - 1-2 sets - 10 reps - 10 seconds hold - Seated  March  - 2 x daily - 6 x weekly - 2-3 sets - 10 reps - Seated Long Arc Quad  - 2 x daily - 6 x weekly - 2-3 sets - 10 reps - 3 hold - Long Sitting Quad Set with Towel Roll Under Heel  - 5 x daily - 7 x weekly - 2 sets - 10 reps - 5 seconds hold - Prone Knee Extension with Ankle Weight  - 2-3 x daily - 7 x weekly - 1 sets - 2-3 reps - 2 minutes hold - Prone Knee Flexion  - 2-3 x daily - 7 x weekly - 2-3 sets - 10 reps - 5 seconds hold - Prone Knee Extension Overpressure  - 2-3 x daily - 7 x weekly - 2-3 sets - 10 reps - 5 seconds hold   ASSESSMENT:  CLINICAL IMPRESSION:   he is slowly improving but still with severe quad weakness so did try NMES [place] stimulation to see if we can improve quad recruitment. Used Vasopnuematic device with knee in elevation at end to reduce knee edema.   OBJECTIVE IMPAIRMENTS: decreased activity tolerance, difficulty walking, decreased balance, decreased endurance, decreased mobility, decreased ROM, decreased strength, impaired flexibility, impaired LE use, and pain.  ACTIVITY LIMITATIONS: bending, lifting, carry, locomotion, cleaning, community activity, driving, and  occupation  PERSONAL FACTORS: see above PMH are also affecting patient's functional outcome.  REHAB POTENTIAL: Good  CLINICAL DECISION MAKING: Stable/uncomplicated  EVALUATION COMPLEXITY: Low    GOALS: Short term PT Goals Target date: 04/06/2023   Pt will be I and compliant with HEP. Baseline:  Goal status: Ongoing 03/16/2023   Long term PT goals Target date:06/01/2023   Pt will improve Rt knee ROM to [facility] (3-115 deg) to improve functional mobility Baseline: Goal status: Ongoing 03/16/2023 Pt will improve Rt hip/knee strength to at least 4+/5 MMT to improve functional strength Baseline: Goal status: Ongoing 03/16/2023 Pt will improve FOTO to at least 71% functional to  show improved function Baseline: Goal status: Ongoing 03/16/2023 Pt will reduce pain to overall less than 2-3/10 with usual activity and feel ready to return to work activity. Baseline: Goal status: Ongoing 03/16/2023 Pt will be able to ambulate community distances at least 1000 ft WNL gait pattern without AD Baseline: Goal status: Ongoing 03/16/2023  PLAN: PT FREQUENCY: 1-3 times per week   PT DURATION: 6-12 weeks  PLANNED INTERVENTIONS (unless contraindicated): aquatic PT, Canalith repositioning, cryotherapy, Electrical stimulation, Iontophoresis with 4 mg/ml dexamethasome, Moist heat, traction, Ultrasound, gait training, Therapeutic exercise, balance training, neuromuscular re-education, patient/family education, prosthetic training, manual techniques, passive ROM, dry needling, taping, vasopnuematic device, vestibular, spinal manipulations, joint manipulations 97110-Therapeutic exercises, 97530- Therapeutic activity, 97112- Neuromuscular re-education, 97535- Self Care, and 16109- Manual therapy  PLAN FOR NEXT SESSION: NMES russian for quad recruitment until he can recruit quad better to lift leg against gravity.  continue gait without knee immobilizer. Exercises for Quad activation focus and knee ROM.   April Manson, PT, DPT 03/24/2023, 4:35 PM

## 2023-03-24 NOTE — Progress Notes (Signed)
Post-Op Visit Note   Patient: Johnny [name]           Date of Birth: 29-Sep-1955           MRN: [id number] Visit Date: 03/24/2023 PCP: Myrlene Broker, MD   Assessment & Plan:  Chief Complaint:  Chief Complaint  Patient presents with   Right Knee - Follow-up    Right knee scope 02/18/2023   Visit Diagnoses:  1. Effusion, right knee     Plan: Tracer is 4 weeks postop from right knee scope.  He is doing physical therapy twice a week.  He is still using a walker and a knee immobilizer.  He has been out of work.  Exam of the right knee shows fully healed surgical scars.  The effusion and the swelling have improved.  Passive flexion to about 95 degrees.  He is still unable to extend his knee against gravity.  Anne Hahn is a 67 year old gentleman who is about 4 to 5 weeks status post right knee scope.  Cultures were negative for infection.  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  He has had a lot of difficulty regaining his quads and so he needs to continue physical therapy.  He will need TENS or a stem.  I would like to recheck him in 3 months.  Ultimately he is likely facing a total knee replacement but not until his quads regain function.  He will have his PCP make referral for rheumatologist in [place] that he previously had an appointment with.  Follow-Up Instructions: Return in about 3 months (around 06/24/2023).   Orders:  No orders of the defined types were placed in this encounter.  No orders of the defined types were placed in this encounter.   Imaging: No results found.  PMFS History: Patient Active Problem List   Diagnosis Date Noted   Rheumatoid factor positive 02/10/2023   Knee pain, right 01/21/2023   Hyperlipidemia 01/20/2023   Effusion, right knee 01/20/2023   Hypercoagulable state due to typical atrial flutter (HCC) 01/13/2023   Venous stasis of lower extremity 01/09/2023   BPH with obstruction/lower urinary tract  symptoms 05/31/2020   Paroxysmal atrial flutter (HCC)    Adrenal nodule (HCC) 03/28/2020   OSA (obstructive sleep apnea) 03/28/2020   Morbid obesity (HCC) 03/28/2020   Medication noncompliance due to cognitive impairment 03/28/2020   Chronic systolic CHF (congestive heart failure) (HCC) 03/28/2020   CKD (chronic kidney disease) stage 4, GFR 15-29 ml/min (HCC) 06/10/2017   PSORIASIS, 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   Type 2 diabetes with complication (HCC) 08/03/2007   Hyperlipidemia associated with type 2 diabetes mellitus (HCC) 08/03/2007   Essential hypertension 08/03/2007   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   Qualifier: Diagnosis of  By: Debby Bud MD, Rosalyn Gess    Sleep apnea    Type II diabetes mellitus with  renal manifestations, uncontrolled 08/03/2007   Qualifier: Diagnosis of  By: Debby Bud MD, Rosalyn Gess  Med metformin     Family History  Problem Relation Age of Onset   Memory loss Mother        DOB 64   Kidney disease Mother        s/p nephrectomy, infection problem   Dementia Mother    Hypertension Father        DOB [phone]   Hyperlipidemia Father    Diabetes Father    Coronary artery disease Father        with stents   Nephrolithiasis Father    Kidney disease Father        s/p rejected kidney transplant; on HD   Cancer Brother        lung   HIV Brother    Prostate cancer Neg Hx    Colon cancer Neg Hx     Past Surgical History:  Procedure Laterality Date    CARDIOVERSION N/A 07/18/2019   Procedure: CARDIOVERSION;  Surgeon: Lars Masson, MD;  Location: [facility] ENDOSCOPY;  Service: Cardiovascular;  Laterality: N/A;   IRRIGATION AND DEBRIDEMENT KNEE Right 02/18/2023   Procedure: RIGHT KNEE ARTHROSCOPIC IRRIGATION AND DEBRIDEMENT;  Surgeon: Tarry Kos, MD;  Location: [facility] ORS;  Service: Orthopedics;  Laterality: Right;   TEE WITHOUT CARDIOVERSION N/A 07/18/2019   Procedure: TRANSESOPHAGEAL ECHOCARDIOGRAM (TEE);  Surgeon: Lars Masson, MD;  Location: [facility] ENDOSCOPY;  Service: Cardiovascular;  Laterality: N/A;   TOOTH EXTRACTION     XI ROBOTIC ASSISTED SIMPLE PROSTATECTOMY N/A 05/31/2020   Procedure: XI ROBOTIC ASSISTED SIMPLE PROSTATECTOMY-SUPRAPUBIC;  Surgeon: Malen Gauze, MD;  Location: [facility] ORS;  Service: Urology;  Laterality: N/A;   Social History   Occupational History   Occupation: mfg  Tobacco Use   Smoking status: Never    Passive exposure: Yes   Smokeless tobacco: Never  Vaping Use   Vaping status: Never Used  Substance and Sexual Activity   Alcohol use: No   Drug use: No   Sexual activity: Yes

## 2023-03-26 ENCOUNTER — Ambulatory Visit (INDEPENDENT_AMBULATORY_CARE_PROVIDER_SITE_OTHER): Payer: BC Managed Care – PPO | Admitting: Physical Therapy

## 2023-03-26 ENCOUNTER — Encounter: Payer: Self-pay | Admitting: Physical Therapy

## 2023-03-26 DIAGNOSIS — M25662 Stiffness of left knee, not elsewhere classified: Secondary | ICD-10-CM

## 2023-03-26 DIAGNOSIS — M6281 Muscle weakness (generalized): Secondary | ICD-10-CM | POA: Diagnosis not present

## 2023-03-26 DIAGNOSIS — R600 Localized edema: Secondary | ICD-10-CM

## 2023-03-26 DIAGNOSIS — R2689 Other abnormalities of gait and mobility: Secondary | ICD-10-CM | POA: Diagnosis not present

## 2023-03-26 NOTE — Therapy (Signed)
OUTPATIENT PHYSICAL THERAPY LOWER EXTREMITY TREATMENT   Patient Name: Johnny [name] MRN: [id number] DOB:10/09/1955, 67 y.o., male Today's Date: 03/26/2023  END OF SESSION:  PT End of Session - 03/26/23 1509     Visit Number 6    Number of Visits 20    Date for PT Re-Evaluation 06/01/23    PT Start Time 1510    PT Stop Time 1558    PT Time Calculation (min) 48 min    Activity Tolerance Patient tolerated treatment well;No increased pain    Behavior During Therapy [facility] for tasks assessed/performed              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   Qualifier: Diagnosis of  By: Debby Bud MD, Rosalyn Gess    Sleep apnea    Type II diabetes mellitus with renal manifestations, uncontrolled 08/03/2007   Qualifier: Diagnosis of  By: Debby Bud MD, Rosalyn Gess  Med metformin    Past Surgical History:  Procedure Laterality Date   CARDIOVERSION N/A 07/18/2019   Procedure: CARDIOVERSION;  Surgeon: Lars Masson, MD;  Location: [facility] ENDOSCOPY;  Service: Cardiovascular;  Laterality: N/A;   IRRIGATION AND DEBRIDEMENT KNEE Right 02/18/2023   Procedure: RIGHT KNEE ARTHROSCOPIC IRRIGATION AND DEBRIDEMENT;  Surgeon: Tarry Kos, MD;  Location: [facility] ORS;  Service: Orthopedics;  Laterality: Right;   TEE WITHOUT CARDIOVERSION N/A 07/18/2019   Procedure: TRANSESOPHAGEAL ECHOCARDIOGRAM (TEE);  Surgeon: Lars Masson, MD;  Location: [facility] ENDOSCOPY;  Service: Cardiovascular;   Laterality: N/A;   TOOTH EXTRACTION     XI ROBOTIC ASSISTED SIMPLE PROSTATECTOMY N/A 05/31/2020   Procedure: XI ROBOTIC ASSISTED SIMPLE PROSTATECTOMY-SUPRAPUBIC;  Surgeon: Malen Gauze, MD;  Location: [facility] ORS;  Service: Urology;  Laterality: N/A;   Patient Active Problem List   Diagnosis Date Noted   Rheumatoid factor positive 02/10/2023   Knee pain, right 01/21/2023   Hyperlipidemia 01/20/2023   Effusion, right knee 01/20/2023   Hypercoagulable state due to typical atrial flutter (HCC) 01/13/2023   Venous stasis of lower extremity 01/09/2023   BPH with obstruction/lower urinary tract symptoms 05/31/2020   Paroxysmal atrial flutter (HCC)    Adrenal nodule (HCC) 03/28/2020   OSA (obstructive sleep apnea) 03/28/2020   Morbid obesity (HCC) 03/28/2020   Medication noncompliance due to cognitive impairment 03/28/2020   Chronic systolic CHF (congestive heart failure) (HCC) 03/28/2020   CKD (chronic kidney disease) stage 4, GFR 15-29 ml/min (HCC) 06/10/2017   PSORIASIS, 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   Type 2 diabetes with complication (HCC) 08/03/2007   Hyperlipidemia associated with type 2 diabetes mellitus (HCC) 08/03/2007   Essential hypertension 08/03/2007    PCP: Myrlene Broker, MD  REFERRING PROVIDER: Tarry Kos, MD   REFERRING DIAG: [phone] (ICD-10-CM) - Effusion, right knee   THERAPY DIAG:  Muscle weakness (generalized)  Stiffness of left knee, not elsewhere classified  Other abnormalities of gait and mobility  Localized edema  Rationale for Evaluation and Treatment: Rehabilitation  ONSET DATE: Right knee scope, irrigation and debriement, major synovectomy 02/18/2023  SUBJECTIVE:   SUBJECTIVE STATEMENT: Relays his knee is feeling good today.   PERTINENT HISTORY: Right knee scope 02/18/2023 PMH:RA,OSA,Obesity,CHF,CKD,DM2 To wear knee immobilizer until quad function improves  PAIN:  NPRS scale:   0/10 upon arrival with walking into clinic & since last PT  session up to 4/10 Pain location: Rt knee anterior Pain description: pulling Aggravating factors: sitting 90 90 deg without leg supported, walking Relieving factors: resting with leg supported and turning foot out some   PRECAUTIONS: None  RED FLAGS: None   WEIGHT BEARING RESTRICTIONS: No  FALLS:  Has patient fallen in last 6 months? 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  OBJECTIVE:  Note: Objective measures were completed at Evaluation unless otherwise noted.  DIAGNOSTIC FINDINGS:  XR 01/05/23 IMPRESSION: Mild osteoarthritis with moderate knee joint effusion.  PATIENT SURVEYS:  FOTO 57% functional goal is 71%  COGNITION: Overall cognitive status: Within functional limits for tasks assessed      EDEMA:  Moderate edema noted in Rt knee and Lower leg   LOWER EXTREMITY ROM:   AROM/PROM Right eval Right 03/13/2023 Right 03/19/23  Hip flexion     Hip extension     Hip abduction     Hip adduction     Hip internal rotation     Hip external rotation     Knee flexion 60/80 Passive 97   Knee extension 15/10 Passive lacks 10 Supine P: -8* A: quad set -10*  Ankle dorsiflexion     Ankle plantarflexion     Ankle inversion     Ankle eversion      (Blank rows = not tested)  LOWER EXTREMITY MMT:  MMT Right eval Left eval  Hip flexion 2   Hip extension    Hip abduction    Hip adduction    Hip internal rotation    Hip external rotation    Knee flexion 3   Knee extension 2   Ankle dorsiflexion    Ankle plantarflexion    Ankle inversion    Ankle eversion     (Blank rows = not tested)  GAIT: Eval Comments: Currently using RW and in knee immobilizer  druing ambulation. He did not use AD PLOF   TODAY'S TREATMENT:  03/26/2023: Therapeutic Exercise: Nustep seat 15 level 7 with BLEs only for 8 min Leg press BLEs 75# 12 reps 2 sets, then Rt leg only 25# 2X12 Step ups with bilat UE support 2 inch step X 15 leading with Rt leg Standing TKE green 2X15  NMES russian Estim 4 pads to quads with co-contract mode, 5/5 sec on/off with quad sets X 4 min and LAQ X 4 min  -Vasopnuematic device X 10 min, medium compression, 34 deg to Rt knee  03/24/2023: Therapeutic Exercise: Nustep seat 15 level 7 with BLEs only for 8 min Leg press BLEs 75# 10 reps 2 sets, then Rt leg only 25# 2X10 Step ups with bilat UE support 2 inch step X 10 leading with Rt leg  NMES russian Estim 4 pads to [employer]  with co-contract mode, 5/5 sec on/off with quad sets X 4 min and LAQ X 4 min  -Vasopnuematic device X 10 min, medium compression, 34 deg to Rt knee    03/16/2023: Therapeutic Exercise: Nustep seat 15 level 5 with BLEs & BUEs for 4 min, then BLEs only for 4 min Leg press BLEs 75# 10 reps 2 sets Hamstring stretch with strap & manual assist of PT 30 sec hold 2 reps Prone hang 2 minutes Prone knee flexion and active knee ext end range pulling foot towards floor 5 sec 10 reps PT added above 2 to HEP with HO & verbal cues.  Pt verbalized and return demo understanding.   Therapeutic Activities: Pt amb 40' X 3 with RW without knee immobilizer with CGA. No knee instability noted today. Sitting on 29" mat table simulating bar stool with cues for ADLs that he normally would stand to perform. Sit to stand 10 reps without UE assist from 29" ht cues for equal LE use / RW close for safety but no touch needed. Pt verbalized understanding PT recommendation.  Pt requires assistance by PT or his LLE to move RLE in / out of bed.   Manual Therapy: PROM overpressure and Grade IV for ext and flex  Vasopneumatic: Medium right knee 10 minutes 34* with elevation alphabet 2  sets    TREATMENT:  03/16/2023: Therapeutic Exercise: Nustep seat 15 level 5 with BLEs & BUEs for 6 min, then BLEs only for 2 min Seated heel slide with foot on pillow case: ext / quad set and active knee flexion end range stretch with LLE assist followed by isometric hamstring  10 reps 2 sets Hamstring stretch seated with strap 30 sec hold 2 reps  Following exercises until PT noted muscle fatigue: Supine quad set with ankle on towel roll with PT tactile cues for VMO 3 sec hold 6 reps 2 sets Supine RLE SAQ PT assisted - tactile cues for VMO prior to concentric, reset max ext and controlled eccentric 5 reps prior to fatigue Sidelying manual assist closed chain leg press 6 reps prior to fatigue  Therapeutic Activities: Pt amb 40' with RW without knee immobilizer with CGA. Pt excessive UE support on RW to control knee instability with one buckling controlled by UEs.   PT cued on using pillows in bed for positioning and "tenting" sheets off feet.  Pt verbalized understanding. Pt requires assistance by PT or his LLE to move RLE in / out of bed.   Manual Therapy: PROM with overpressure for knee ext  Vasopneumatic: Medium right knee 10 minutes 34* with elevation   03/13/2023 Supine knee flexion with strap 2 sets of 10 for 5 seconds Supine quadriceps sets with heel prop 2 sets of 10 for 5 seconds Straight leg raise with help from strap 10 x 10 seconds Seated knee flexion AAROM (left pushes right into flexion) 10 x 5 seconds Seated LAQ, attempted and held due to knee pain Seated marching 10 x  Vaso Medium right knee 10 minutes (update 1 change to HEP and review while on vaso)   Eval HEP creation and review with demonstration and trial set preformed, see below for details -Vasopnuematic device X 10 min, medium compression, 34 deg to Rt knee in elevation   PATIENT EDUCATION: Education details: HEP, PT plan of care Person educated: Patient Education method: Explanation,  Demonstration, Verbal cues, and Handouts Education comprehension: verbalized understanding and needs further education   HOME EXERCISE PROGRAM: Access Code: [facility] URL: [email or web address]  Date: 03/19/2023 Prepared by: [name] Faster  Exercises - Supine Quad Set  - 2 x daily - 6 x weekly - 2-3 sets - 10 reps - 5 sec hold - Supine Heel Slide with Strap  - 2 x daily - 6 x weekly - 2-3 sets - 10 reps - 5 hold - straight leg raise with help from strap  - 2 x daily - 6 x weekly - 1-2 sets - 10 reps - 10 seconds hold - Seated March  - 2 x daily - 6 x weekly - 2-3 sets - 10 reps - Seated Long Arc Quad  - 2 x daily - 6 x weekly - 2-3 sets - 10 reps - 3 hold - Long Sitting Quad Set with Towel Roll Under Heel  - 5 x daily - 7 x weekly - 2 sets - 10 reps - 5 seconds hold - Prone Knee Extension with Ankle Weight  - 2-3 x daily - 7 x weekly - 1 sets - 2-3 reps - 2 minutes hold - Prone Knee Flexion  - 2-3 x daily - 7 x weekly - 2-3 sets - 10 reps - 5 seconds hold - Prone Knee Extension Overpressure  - 2-3 x daily - 7 x weekly - 2-3 sets - 10 reps - 5 seconds hold   ASSESSMENT:  CLINICAL IMPRESSION:   He had good response to NMES to quads last time so this was repeated in efforts to improve quad contraction and strength. He had good tolerance to strengthening program today.  OBJECTIVE IMPAIRMENTS: decreased activity tolerance, difficulty walking, decreased balance, decreased endurance, decreased mobility, decreased ROM, decreased strength, impaired flexibility, impaired LE use, and pain.  ACTIVITY LIMITATIONS: bending, lifting, carry, locomotion, cleaning, community activity, driving, and  occupation  PERSONAL FACTORS: see above PMH are also affecting patient's functional outcome.  REHAB POTENTIAL: Good  CLINICAL DECISION MAKING: Stable/uncomplicated  EVALUATION COMPLEXITY: Low    GOALS: Short term PT Goals Target date: 04/06/2023   Pt will be I and compliant with  HEP. Baseline:  Goal status: Ongoing 03/16/2023   Long term PT goals Target date:06/01/2023   Pt will improve Rt knee ROM to [facility] (3-115 deg) to improve functional mobility Baseline: Goal status: Ongoing 03/16/2023 Pt will improve Rt hip/knee strength to at least 4+/5 MMT to improve functional strength Baseline: Goal status: Ongoing 03/16/2023 Pt will improve FOTO to at least 71% functional to show improved function Baseline: Goal status: Ongoing 03/16/2023 Pt will reduce pain to overall less than 2-3/10 with usual activity and feel ready to return to work activity. Baseline: Goal status: Ongoing 03/16/2023 Pt will be able to ambulate community distances at least 1000 ft WNL gait pattern without AD Baseline: Goal status: Ongoing 03/16/2023  PLAN: PT FREQUENCY: 1-3 times per week   PT DURATION: 6-12 weeks  PLANNED INTERVENTIONS (unless contraindicated): aquatic PT, Canalith repositioning, cryotherapy, Electrical stimulation, Iontophoresis with 4 mg/ml dexamethasome, Moist heat, traction, Ultrasound, gait training, Therapeutic exercise, balance training, neuromuscular re-education, patient/family education, prosthetic training, manual techniques, passive ROM, dry needling, taping, vasopnuematic device, vestibular, spinal manipulations, joint manipulations 97110-Therapeutic exercises, 97530- Therapeutic activity, 97112- Neuromuscular re-education, 97535- Self Care, and [id number]- Manual therapy  PLAN FOR NEXT SESSION: NMES russian for quad recruitment until he can recruit quad better to lift leg against gravity.  continue gait without knee immobilizer. Exercises for Quad activation focus and knee ROM.   April Manson, PT, DPT 03/26/2023, 3:20 PM

## 2023-03-30 ENCOUNTER — Encounter: Payer: Self-pay | Admitting: Physical Therapy

## 2023-03-30 ENCOUNTER — Ambulatory Visit (INDEPENDENT_AMBULATORY_CARE_PROVIDER_SITE_OTHER): Payer: BC Managed Care – PPO | Admitting: Physical Therapy

## 2023-03-30 DIAGNOSIS — M25662 Stiffness of left knee, not elsewhere classified: Secondary | ICD-10-CM | POA: Diagnosis not present

## 2023-03-30 DIAGNOSIS — R600 Localized edema: Secondary | ICD-10-CM

## 2023-03-30 DIAGNOSIS — M6281 Muscle weakness (generalized): Secondary | ICD-10-CM | POA: Diagnosis not present

## 2023-03-30 DIAGNOSIS — R2689 Other abnormalities of gait and mobility: Secondary | ICD-10-CM | POA: Diagnosis not present

## 2023-03-30 NOTE — Therapy (Signed)
OUTPATIENT PHYSICAL THERAPY LOWER EXTREMITY TREATMENT   Patient Name: Johnny [name] MRN: [id number] DOB:08-Nov-1955, 67 y.o., male Today's Date: 03/30/2023  END OF SESSION:  PT End of Session - 03/30/23 1636     Visit Number 7    Number of Visits 20    Date for PT Re-Evaluation 06/01/23    PT Start Time 1557    PT Stop Time 1646    PT Time Calculation (min) 49 min    Activity Tolerance Patient tolerated treatment well;No increased pain    Behavior During Therapy [facility] for tasks assessed/performed               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   Qualifier: Diagnosis of  By: Debby Bud MD, Rosalyn Gess    Sleep apnea    Type II diabetes mellitus with renal manifestations, uncontrolled 08/03/2007   Qualifier: Diagnosis of  By: Debby Bud MD, Rosalyn Gess  Med metformin    Past Surgical History:  Procedure Laterality Date   CARDIOVERSION N/A 07/18/2019   Procedure: CARDIOVERSION;  Surgeon: Lars Masson, MD;  Location: [facility] ENDOSCOPY;  Service: Cardiovascular;  Laterality: N/A;   IRRIGATION AND DEBRIDEMENT KNEE Right 02/18/2023   Procedure: RIGHT KNEE ARTHROSCOPIC IRRIGATION AND DEBRIDEMENT;  Surgeon: Tarry Kos, MD;  Location: [facility] ORS;  Service: Orthopedics;  Laterality: Right;   TEE WITHOUT CARDIOVERSION N/A 07/18/2019   Procedure: TRANSESOPHAGEAL ECHOCARDIOGRAM (TEE);  Surgeon: Lars Masson, MD;  Location: [facility] ENDOSCOPY;  Service: Cardiovascular;   Laterality: N/A;   TOOTH EXTRACTION     XI ROBOTIC ASSISTED SIMPLE PROSTATECTOMY N/A 05/31/2020   Procedure: XI ROBOTIC ASSISTED SIMPLE PROSTATECTOMY-SUPRAPUBIC;  Surgeon: Malen Gauze, MD;  Location: [facility] ORS;  Service: Urology;  Laterality: N/A;   Patient Active Problem List   Diagnosis Date Noted   Rheumatoid factor positive 02/10/2023   Knee pain, right 01/21/2023   Hyperlipidemia 01/20/2023   Effusion, right knee 01/20/2023   Hypercoagulable state due to typical atrial flutter (HCC) 01/13/2023   Venous stasis of lower extremity 01/09/2023   BPH with obstruction/lower urinary tract symptoms 05/31/2020   Paroxysmal atrial flutter (HCC)    Adrenal nodule (HCC) 03/28/2020   OSA (obstructive sleep apnea) 03/28/2020   Morbid obesity (HCC) 03/28/2020   Medication noncompliance due to cognitive impairment 03/28/2020   Chronic systolic CHF (congestive heart failure) (HCC) 03/28/2020   CKD (chronic kidney disease) stage 4, GFR 15-29 ml/min (HCC) 06/10/2017   PSORIASIS, 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   Type 2 diabetes with complication (HCC) 08/03/2007   Hyperlipidemia associated with type 2 diabetes mellitus (HCC) 08/03/2007   Essential hypertension 08/03/2007    PCP: Myrlene Broker, MD  REFERRING PROVIDER: Tarry Kos, MD   REFERRING DIAG: [phone] (ICD-10-CM) - Effusion, right knee   THERAPY DIAG:  Muscle weakness (generalized)  Stiffness of left knee, not elsewhere classified  Other abnormalities of gait and  mobility  Localized edema  Rationale for Evaluation and Treatment: Rehabilitation  ONSET DATE: Right knee scope, irrigation and debriement, major synovectomy 02/18/2023  SUBJECTIVE:   SUBJECTIVE STATEMENT: Relays his knee is doing much better overall.  PERTINENT HISTORY: Right knee scope 02/18/2023 PMH:RA,OSA,Obesity,CHF,CKD,DM2 To wear knee immobilizer until quad function improves  PAIN:  NPRS scale:   0/10 upon arrival but does have a little soreness Pain  location: Rt knee anterior Pain description: pulling Aggravating factors: sitting 90 90 deg without leg supported, walking Relieving factors: resting with leg supported and turning foot out some   PRECAUTIONS: None  RED FLAGS: None   WEIGHT BEARING RESTRICTIONS: No  FALLS:  Has patient fallen in last 6 months? 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  OBJECTIVE:  Note: Objective measures were completed at Evaluation unless otherwise noted.  DIAGNOSTIC FINDINGS:  XR 01/05/23 IMPRESSION: Mild osteoarthritis with moderate knee joint effusion.  PATIENT SURVEYS:  FOTO 57% functional goal is 71%  COGNITION: Overall cognitive status: Within functional limits for tasks assessed      EDEMA:  Moderate edema noted in Rt knee and Lower leg   LOWER EXTREMITY ROM:   AROM/PROM Right eval Right 03/13/2023 Right 03/19/23  Hip flexion     Hip extension     Hip abduction     Hip adduction     Hip internal rotation     Hip external rotation     Knee flexion 60/80 Passive 97   Knee extension 15/10 Passive lacks 10 Supine P: -8* A: quad set -10*  Ankle dorsiflexion     Ankle plantarflexion     Ankle inversion     Ankle eversion      (Blank rows = not tested)  LOWER EXTREMITY MMT:  MMT Right eval Left eval  Hip flexion 2   Hip extension    Hip abduction    Hip adduction    Hip internal rotation    Hip external rotation    Knee flexion 3   Knee extension 2   Ankle dorsiflexion    Ankle plantarflexion    Ankle inversion    Ankle eversion     (Blank rows = not tested)  GAIT: Eval Comments: Currently using RW and in knee immobilizer druing ambulation. He did  not use AD PLOF   TODAY'S TREATMENT:  03/30/2023: Therapeutic Exercise: Nustep seat 15 level 8 with BLEs only for 8 min Gait with SPC with gait belt and min A. Step to pattern due to weakness Leg press BLEs 81# 12 reps 2 sets, then Rt leg only 31# 3X10 Step ups with bilat UE support 2 inch step 2X10 leading with Rt leg Standing TKE green 2X10 Seated LAQ  with min A from other leg to complete 2nd half ROM, 2X10 NMES russian Estim 4 pads to quads with co-contract mode, 5/5 sec on/off with seated quad sets X 5 min and standing SLR X 3 min  -Vasopnuematic device X 10 min, medium compression, 34 deg to Rt knee  03/26/2023: Therapeutic Exercise: Nustep seat 15 level 7 with BLEs only for 8 min Leg press BLEs 75# 12 reps 2 sets, then Rt leg  only 25# 2X12 Step ups with bilat UE support 2 inch step X 15 leading with Rt leg Standing TKE green 2X15  NMES russian Estim 4 pads to quads with co-contract mode, 5/5 sec on/off with quad sets X 4 min and LAQ X 4 min  -Vasopnuematic device X 10 min, medium compression, 34 deg to Rt knee  03/24/2023: Therapeutic Exercise: Nustep seat 15 level 7 with BLEs only for 8 min Leg press BLEs 75# 10 reps 2 sets, then Rt leg only 25# 2X10 Step ups with bilat UE support 2 inch step X 10 leading with Rt leg  NMES russian Estim 4 pads to quads with co-contract mode, 5/5 sec on/off with quad sets X 4 min and LAQ X 4 min  -Vasopnuematic device X 10 min, medium compression, 34 deg to Rt knee      TREATMENT:  03/16/2023: Therapeutic Exercise: Nustep seat 15 level 5 with BLEs & BUEs for 6 min, then BLEs only for 2 min Seated heel slide with foot on pillow case: ext / quad set and active knee flexion end range stretch with LLE assist followed by isometric hamstring  10 reps 2 sets Hamstring stretch seated with strap 30 sec hold 2 reps  Following exercises until PT noted muscle fatigue: Supine quad set with ankle on towel roll with PT tactile cues for VMO 3  sec hold 6 reps 2 sets Supine RLE SAQ PT assisted - tactile cues for VMO prior to concentric, reset max ext and controlled eccentric 5 reps prior to fatigue Sidelying manual assist closed chain leg press 6 reps prior to fatigue  Therapeutic Activities: Pt amb 40' with RW without knee immobilizer with CGA. Pt excessive UE support on RW to control knee instability with one buckling controlled by UEs.   PT cued on using pillows in bed for positioning and "tenting" sheets off feet.  Pt verbalized understanding. Pt requires assistance by PT or his LLE to move RLE in / out of bed.   Manual Therapy: PROM with overpressure for knee ext  Vasopneumatic: Medium right knee 10 minutes 34* with elevation   03/13/2023 Supine knee flexion with strap 2 sets of 10 for 5 seconds Supine quadriceps sets with heel prop 2 sets of 10 for 5 seconds Straight leg raise with help from strap 10 x 10 seconds Seated knee flexion AAROM (left pushes right into flexion) 10 x 5 seconds Seated LAQ, attempted and held due to knee pain Seated marching 10 x  Vaso Medium right knee 10 minutes (update 1 change to HEP and review while on vaso)   Eval HEP creation and review with demonstration and trial set preformed, see below for details -Vasopnuematic device X 10 min, medium compression, 34 deg to Rt knee in elevation   PATIENT EDUCATION: Education details: HEP, PT plan of care Person educated: Patient Education method: Explanation, Demonstration, Verbal cues, and Handouts Education comprehension: verbalized understanding and needs further education   HOME EXERCISE PROGRAM: Access Code: [facility] URL: [email or web address] Date: 03/19/2023 Prepared by: [name] Faster  Exercises - Supine Quad Set  - 2 x daily - 6 x weekly - 2-3 sets - 10 reps - 5 sec hold - Supine Heel Slide with Strap  - 2 x daily - 6 x weekly - 2-3 sets - 10 reps - 5 hold - straight leg raise with help from strap  - 2 x daily - 6  x weekly - 1-2 sets - 10 reps - 10 seconds hold - Seated  March  - 2 x daily - 6 x weekly - 2-3 sets - 10 reps - Seated Long Arc Quad  - 2 x daily - 6 x weekly - 2-3 sets - 10 reps - 3 hold - Long Sitting Quad Set with Towel Roll Under Heel  - 5 x daily - 7 x weekly - 2 sets - 10 reps - 5 seconds hold - Prone Knee Extension with Ankle Weight  - 2-3 x daily - 7 x weekly - 1 sets - 2-3 reps - 2 minutes hold - Prone Knee Flexion  - 2-3 x daily - 7 x weekly - 2-3 sets - 10 reps - 5 seconds hold - Prone Knee Extension Overpressure  - 2-3 x daily - 7 x weekly - 2-3 sets - 10 reps - 5 seconds hold   ASSESSMENT:  CLINICAL IMPRESSION:  We continue to progress his strength program within his tolerance. He still has quad weakness noted limited his function and will continue to benefit from strengthening. He is slowly improving but will need continued work.  OBJECTIVE IMPAIRMENTS: decreased activity tolerance, difficulty walking, decreased balance, decreased endurance, decreased mobility, decreased ROM, decreased strength, impaired flexibility, impaired LE use, and pain.  ACTIVITY LIMITATIONS: bending, lifting, carry, locomotion, cleaning, community activity, driving, and  occupation  PERSONAL FACTORS: see above PMH are also affecting patient's functional outcome.  REHAB POTENTIAL: Good  CLINICAL DECISION MAKING: Stable/uncomplicated  EVALUATION COMPLEXITY: Low    GOALS: Short term PT Goals Target date: 04/06/2023   Pt will be I and compliant with HEP. Baseline:  Goal status: Ongoing 03/16/2023   Long term PT goals Target date:06/01/2023   Pt will improve Rt knee ROM to [facility] (3-115 deg) to improve functional mobility Baseline: Goal status: Ongoing 03/16/2023 Pt will improve Rt hip/knee strength to at least 4+/5 MMT to improve functional strength Baseline: Goal status: Ongoing 03/16/2023 Pt will improve FOTO to at least 71% functional to show improved function Baseline: Goal status:  Ongoing 03/16/2023 Pt will reduce pain to overall less than 2-3/10 with usual activity and feel ready to return to work activity. Baseline: Goal status: Ongoing 03/16/2023 Pt will be able to ambulate community distances at least 1000 ft WNL gait pattern without AD Baseline: Goal status: Ongoing 03/16/2023  PLAN: PT FREQUENCY: 1-3 times per week   PT DURATION: 6-12 weeks  PLANNED INTERVENTIONS (unless contraindicated): aquatic PT, Canalith repositioning, cryotherapy, Electrical stimulation, Iontophoresis with 4 mg/ml dexamethasome, Moist heat, traction, Ultrasound, gait training, Therapeutic exercise, balance training, neuromuscular re-education, patient/family education, prosthetic training, manual techniques, passive ROM, dry needling, taping, vasopnuematic device, vestibular, spinal manipulations, joint manipulations 97110-Therapeutic exercises, 97530- Therapeutic activity, 97112- Neuromuscular re-education, 97535- Self Care, and 28413- Manual therapy  PLAN FOR NEXT SESSION: NMES russian for quad recruitment until he can recruit quad better to lift leg against gravity.  continue gait without knee immobilizer. Exercises for Quad activation focus and knee ROM.   April Manson, PT, DPT 03/30/2023, 4:37 PM

## 2023-04-01 ENCOUNTER — Encounter: Payer: Self-pay | Admitting: Rehabilitative and Restorative Service Providers"

## 2023-04-01 ENCOUNTER — Ambulatory Visit (INDEPENDENT_AMBULATORY_CARE_PROVIDER_SITE_OTHER): Payer: BC Managed Care – PPO | Admitting: Rehabilitative and Restorative Service Providers"

## 2023-04-01 DIAGNOSIS — M6281 Muscle weakness (generalized): Secondary | ICD-10-CM | POA: Diagnosis not present

## 2023-04-01 DIAGNOSIS — M25662 Stiffness of left knee, not elsewhere classified: Secondary | ICD-10-CM

## 2023-04-01 DIAGNOSIS — R600 Localized edema: Secondary | ICD-10-CM

## 2023-04-01 DIAGNOSIS — R2689 Other abnormalities of gait and mobility: Secondary | ICD-10-CM | POA: Diagnosis not present

## 2023-04-01 NOTE — Therapy (Signed)
OUTPATIENT PHYSICAL THERAPY TREATMENT   Patient Name: Johnny [name] MRN: [id number] DOB:07-Feb-1956, 67 y.o., male Today's Date: 04/01/2023  END OF SESSION:  PT End of Session - 04/01/23 1422     Visit Number 8    Number of Visits 20    Date for PT Re-Evaluation 06/01/23    Authorization Type BCBS 20% coinsurance    PT Start Time 1423    PT Stop Time 1513    PT Time Calculation (min) 50 min    Activity Tolerance Patient tolerated treatment well    Behavior During Therapy [facility] for tasks assessed/performed                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   Qualifier: Diagnosis of  By: Debby Bud MD, Rosalyn Gess    Sleep apnea    Type II diabetes mellitus with renal manifestations, uncontrolled 08/03/2007   Qualifier: Diagnosis of  By: Debby Bud MD, Rosalyn Gess  Med metformin    Past Surgical History:  Procedure Laterality Date   CARDIOVERSION N/A 07/18/2019   Procedure: CARDIOVERSION;  Surgeon: Lars Masson, MD;  Location: [facility] ENDOSCOPY;  Service: Cardiovascular;  Laterality: N/A;   IRRIGATION AND DEBRIDEMENT KNEE Right 02/18/2023   Procedure: RIGHT KNEE ARTHROSCOPIC IRRIGATION AND DEBRIDEMENT;  Surgeon: Tarry Kos, MD;  Location: [facility] ORS;  Service: Orthopedics;  Laterality: Right;   TEE WITHOUT CARDIOVERSION N/A 07/18/2019   Procedure: TRANSESOPHAGEAL ECHOCARDIOGRAM (TEE);  Surgeon: Lars Masson, MD;  Location: [facility] ENDOSCOPY;  Service:  Cardiovascular;  Laterality: N/A;   TOOTH EXTRACTION     XI ROBOTIC ASSISTED SIMPLE PROSTATECTOMY N/A 05/31/2020   Procedure: XI ROBOTIC ASSISTED SIMPLE PROSTATECTOMY-SUPRAPUBIC;  Surgeon: Malen Gauze, MD;  Location: [facility] ORS;  Service: Urology;  Laterality: N/A;   Patient Active Problem List   Diagnosis Date Noted   Rheumatoid factor positive 02/10/2023   Knee pain, right 01/21/2023   Hyperlipidemia 01/20/2023   Effusion, right knee 01/20/2023   Hypercoagulable state due to typical atrial flutter (HCC) 01/13/2023   Venous stasis of lower extremity 01/09/2023   BPH with obstruction/lower urinary tract symptoms 05/31/2020   Paroxysmal atrial flutter (HCC)    Adrenal nodule (HCC) 03/28/2020   OSA (obstructive sleep apnea) 03/28/2020   Morbid obesity (HCC) 03/28/2020   Medication noncompliance due to cognitive impairment 03/28/2020   Chronic systolic CHF (congestive heart failure) (HCC) 03/28/2020   CKD (chronic kidney disease) stage 4, GFR 15-29 ml/min (HCC) 06/10/2017   PSORIASIS, 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   Type 2 diabetes with complication (HCC) 08/03/2007   Hyperlipidemia associated with type 2 diabetes mellitus (HCC) 08/03/2007   Essential hypertension 08/03/2007    PCP: Myrlene Broker, MD  REFERRING PROVIDER: Tarry Kos, MD   REFERRING DIAG: [phone] (ICD-10-CM) - Effusion, right knee   THERAPY DIAG:  Muscle weakness (generalized)  Stiffness of left knee, not elsewhere classified  Other abnormalities of gait and mobility  Localized edema  Rationale for Evaluation and Treatment: Rehabilitation  ONSET DATE: Right knee scope, irrigation and debriement, major synovectomy 02/18/2023  SUBJECTIVE:   SUBJECTIVE STATEMENT: Pt indicated a little sore overall in front of knee. Pt indicated doing walking in stores and feeling like he does get fatigued.   PERTINENT HISTORY: Right knee scope 02/18/2023 PMH:RA,OSA,Obesity,CHF,CKD,DM2 To wear knee immobilizer until  quad function improves  PAIN:  NPRS scale:   0/10 upon arrival, little soreness Pain location: Rt knee anterior Pain description: pulling Aggravating factors: sitting 90 90 deg without leg supported, walking Relieving factors: resting with leg supported and turning foot out some   PRECAUTIONS: None  RED FLAGS: None   WEIGHT BEARING RESTRICTIONS: No  FALLS:  Has patient fallen in last 6 months? 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  OBJECTIVE:  Note: Objective measures were completed at Evaluation unless otherwise noted.  DIAGNOSTIC FINDINGS:  03/09/2023 review:  XR 01/05/23 IMPRESSION: Mild osteoarthritis with moderate knee joint effusion.  PATIENT SURVEYS:  03/09/2023 FOTO 57% functional goal is 71%  COGNITION: 03/09/2023 Overall cognitive status: Within functional limits for tasks assessed      EDEMA:  03/09/2023 Moderate edema noted in Rt knee and Lower leg   LOWER EXTREMITY ROM:   AROM/PROM Right eval Right 03/13/2023 Right 03/19/23 Right 04/01/2023  Hip flexion      Hip extension      Hip abduction      Hip adduction      Hip internal rotation      Hip external rotation      Knee flexion 60/80 Passive 97    Knee extension 15/10 Passive lacks 10 Supine P: -8* A: quad set -10*   Ankle dorsiflexion      Ankle plantarflexion      Ankle inversion      Ankle eversion       (Blank rows = not tested)  LOWER EXTREMITY MMT:  MMT Right eval Left eval  Hip flexion 2   Hip extension    Hip abduction    Hip adduction    Hip internal rotation    Hip external rotation    Knee flexion 3   Knee extension 2   Ankle dorsiflexion    Ankle  plantarflexion    Ankle inversion    Ankle eversion     (Blank rows = not tested)  GAIT: 04/01/2023:   FWW or crutches reported.   FWW into clinc.   03/09/2023 Comments: Currently using RW and in knee immobilizer druing ambulation. He did not use AD PLOF                   TODAY'S TREATMENT:                                                           DATE: 04/01/2023: Therapeutic Exercise: Nustep seat 15 level 8 with BLEs only for 8 min Discussed seated  hang into Rt knee flexion to encourage relaxation of guarding from Rt quad.  Performed for 2 mins in clinic. Time spent in education for home use. Seated Rt foot heel slide into knee flexion 15 sec hold x 2 Seated Rt knee LAQ attempt to available range, then strap assisted end range then eccentric lowering x 10   NMES to Rt quad russion 5 sec on/off for 4 mins in seated isometric extension in mid range, supine quad set 4 mins Rt leg.   TherActivity Leg press double leg 87 lbs x 15 slow lowering, single leg Rt 37 lbs 2 x 15 Step up with bilateral hands on bar x 10 Rt leg WB 4 inch step   Vasopnuematic  X 10 min, medium compression, 34 deg to Rt knee in elevation   TODAY'S TREATMENT:                                                           DATE: 03/30/2023: Therapeutic Exercise: Nustep seat 15 level 8 with BLEs only for 8 min Gait with SPC with gait belt and min A. Step to pattern due to weakness Leg press BLEs 81# 12 reps 2 sets, then Rt leg only 31# 3X10 Step ups with bilat UE support 2 inch step 2X10 leading with Rt leg Standing TKE green 2X10 Seated LAQ  with min A from other leg to complete 2nd half ROM, 2X10 NMES russian Estim 4 pads to quads with co-contract mode, 5/5 sec on/off with seated quad sets X 5 min and standing SLR X 3 min  -Vasopnuematic device X 10 min, medium compression, 34 deg to Rt knee  TODAY'S TREATMENT:                                                           DATE: 03/26/2023: Therapeutic  Exercise: Nustep seat 15 level 7 with BLEs only for 8 min Leg press BLEs 75# 12 reps 2 sets, then Rt leg only 25# 2X12 Step ups with bilat UE support 2 inch step X 15 leading with Rt leg Standing TKE green 2X15  NMES russian Estim 4 pads to quads with co-contract mode, 5/5 sec on/off with quad sets X 4 min and LAQ X 4 min  -Vasopnuematic device X 10 min, medium compression, 34 deg to Rt knee  TODAY'S TREATMENT:                                                           DATE: 03/24/2023: Therapeutic Exercise: Nustep seat 15 level 7 with BLEs only for 8 min Leg press BLEs 75# 10 reps 2 sets, then Rt leg only 25# 2X10 Step ups with bilat UE support 2 inch step X 10 leading with Rt leg  NMES russian Estim 4 pads to quads with co-contract mode, 5/5 sec on/off with quad sets X 4 min and LAQ X 4 min  -Vasopnuematic device X 10 min, medium  compression, 34 deg to Rt knee   PATIENT EDUCATION: 03/09/2023 Education details: HEP, PT plan of care Person educated: Patient Education method: Explanation, Demonstration, Verbal cues, and Handouts Education comprehension: verbalized understanding and needs further education   HOME EXERCISE PROGRAM: Access Code: [facility] URL: [email or web address] Date: 03/19/2023 Prepared by: [name] Faster  Exercises - Supine Quad Set  - 2 x daily - 6 x weekly - 2-3 sets - 10 reps - 5 sec hold - Supine Heel Slide with Strap  - 2 x daily - 6 x weekly - 2-3 sets - 10 reps - 5 hold - straight leg raise with help from strap  - 2 x daily - 6 x weekly - 1-2 sets - 10 reps - 10 seconds hold - Seated March  - 2 x daily - 6 x weekly - 2-3 sets - 10 reps - Seated Long Arc Quad  - 2 x daily - 6 x weekly - 2-3 sets - 10 reps - 3 hold - Long Sitting Quad Set with Towel Roll Under Heel  - 5 x daily - 7 x weekly - 2 sets - 10 reps - 5 seconds hold - Prone Knee Extension with Ankle Weight  - 2-3 x daily - 7 x weekly - 1 sets - 2-3 reps - 2 minutes hold - Prone  Knee Flexion  - 2-3 x daily - 7 x weekly - 2-3 sets - 10 reps - 5 seconds hold - Prone Knee Extension Overpressure  - 2-3 x daily - 7 x weekly - 2-3 sets - 10 reps - 5 seconds hold   ASSESSMENT:  CLINICAL IMPRESSION:   Guarding of Rt quad in gravity dependent knee flexion mobility but lacking strength for LAQ/SAQ/SLR due to extension lag and symptoms across anterior distal thigh, knee.  Continued skilled PT services indicated at this time to progress towards goals.   OBJECTIVE IMPAIRMENTS: decreased activity tolerance, difficulty walking, decreased balance, decreased endurance, decreased mobility, decreased ROM, decreased strength, impaired flexibility, impaired LE use, and pain.  ACTIVITY LIMITATIONS: bending, lifting, carry, locomotion, cleaning, community activity, driving, and  occupation  PERSONAL FACTORS: see above PMH are also affecting patient's functional outcome.  REHAB POTENTIAL: Good  CLINICAL DECISION MAKING: Stable/uncomplicated  EVALUATION COMPLEXITY: Low    GOALS: Short term PT Goals Target date: 04/06/2023   Pt will be I and compliant with HEP. Baseline:  Goal status: Ongoing 03/16/2023   Long term PT goals Target date:06/01/2023   Pt will improve Rt knee ROM to [facility] (3-115 deg) to improve functional mobility Baseline: Goal status: Ongoing 03/16/2023 Pt will improve Rt hip/knee strength to at least 4+/5 MMT to improve functional strength Baseline: Goal status: Ongoing 03/16/2023 Pt will improve FOTO to at least 71% functional to show improved function Baseline: Goal status: Ongoing 03/16/2023 Pt will reduce pain to overall less than 2-3/10 with usual activity and feel ready to return to work activity. Baseline: Goal status: Ongoing 03/16/2023 Pt will be able to ambulate community distances at least 1000 ft WNL gait pattern without AD Baseline: Goal status: Ongoing 03/16/2023  PLAN: PT FREQUENCY: 1-3 times per week   PT DURATION: 6-12  weeks  PLANNED INTERVENTIONS (unless contraindicated): aquatic PT, Canalith repositioning, cryotherapy, Electrical stimulation, Iontophoresis with 4 mg/ml dexamethasome, Moist heat, traction, Ultrasound, gait training, Therapeutic exercise, balance training, neuromuscular re-education, patient/family education, prosthetic training, manual techniques, passive ROM, dry needling, taping, vasopnuematic device, vestibular, spinal manipulations, joint manipulations 97110-Therapeutic exercises, 97530- Therapeutic activity, [id number]- Neuromuscular re-education, 97535- Self Care, and  [id number]- Manual therapy  PLAN FOR NEXT SESSION: Quad activation/strengthening.    Chyrel Masson, PT, DPT, OCS, ATC 04/01/23  3:06 PM

## 2023-04-02 ENCOUNTER — Other Ambulatory Visit: Payer: Self-pay | Admitting: Internal Medicine

## 2023-04-06 ENCOUNTER — Encounter: Payer: Self-pay | Admitting: Physical Therapy

## 2023-04-06 ENCOUNTER — Other Ambulatory Visit: Payer: Self-pay

## 2023-04-06 ENCOUNTER — Ambulatory Visit (INDEPENDENT_AMBULATORY_CARE_PROVIDER_SITE_OTHER): Payer: BC Managed Care – PPO | Admitting: Physical Therapy

## 2023-04-06 DIAGNOSIS — R600 Localized edema: Secondary | ICD-10-CM

## 2023-04-06 DIAGNOSIS — M25662 Stiffness of left knee, not elsewhere classified: Secondary | ICD-10-CM

## 2023-04-06 DIAGNOSIS — M6281 Muscle weakness (generalized): Secondary | ICD-10-CM

## 2023-04-06 DIAGNOSIS — R2689 Other abnormalities of gait and mobility: Secondary | ICD-10-CM

## 2023-04-06 MED ORDER — ATORVASTATIN CALCIUM 80 MG PO TABS: 80.0000 mg | ORAL_TABLET | Freq: Every day | ORAL | 0 refills | Status: DC

## 2023-04-06 NOTE — Therapy (Signed)
OUTPATIENT PHYSICAL THERAPY TREATMENT   Patient Name: Johnny [name] MRN: [id number] DOB:1955/09/18, 67 y.o., male Today's Date: 04/06/2023  END OF SESSION:  PT End of Session - 04/06/23 1603     Visit Number 9    Number of Visits 20    Date for PT Re-Evaluation 06/01/23    Authorization Type [employer] 20% coinsurance    PT Start Time 1555    PT Stop Time 1643    PT Time Calculation (min) 48 min    Activity Tolerance Patient tolerated treatment well    Behavior During Therapy [facility] for tasks assessed/performed                 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   Qualifier: Diagnosis of  By: Debby Bud MD, Rosalyn Gess    Sleep apnea    Type II diabetes mellitus with renal manifestations, uncontrolled 08/03/2007   Qualifier: Diagnosis of  By: Debby Bud MD, Rosalyn Gess  Med metformin    Past Surgical History:  Procedure Laterality Date   CARDIOVERSION N/A 07/18/2019   Procedure: CARDIOVERSION;  Surgeon: Lars Masson, MD;  Location: [facility] ENDOSCOPY;  Service: Cardiovascular;  Laterality: N/A;   IRRIGATION AND DEBRIDEMENT KNEE Right 02/18/2023   Procedure: RIGHT KNEE ARTHROSCOPIC IRRIGATION AND DEBRIDEMENT;  Surgeon: Tarry Kos, MD;  Location: [facility] ORS;  Service: Orthopedics;  Laterality: Right;   TEE WITHOUT CARDIOVERSION N/A 07/18/2019   Procedure: TRANSESOPHAGEAL ECHOCARDIOGRAM (TEE);  Surgeon: Lars Masson, MD;  Location: [facility] ENDOSCOPY;  Service:  Cardiovascular;  Laterality: N/A;   TOOTH EXTRACTION     XI ROBOTIC ASSISTED SIMPLE PROSTATECTOMY N/A 05/31/2020   Procedure: XI ROBOTIC ASSISTED SIMPLE PROSTATECTOMY-SUPRAPUBIC;  Surgeon: Malen Gauze, MD;  Location: [facility] ORS;  Service: Urology;  Laterality: N/A;   Patient Active Problem List   Diagnosis Date Noted   Rheumatoid factor positive 02/10/2023   Knee pain, right 01/21/2023   Hyperlipidemia 01/20/2023   Effusion, right knee 01/20/2023   Hypercoagulable state due to typical atrial flutter (HCC) 01/13/2023   Venous stasis of lower extremity 01/09/2023   BPH with obstruction/lower urinary tract symptoms 05/31/2020   Paroxysmal atrial flutter (HCC)    Adrenal nodule (HCC) 03/28/2020   OSA (obstructive sleep apnea) 03/28/2020   Morbid obesity (HCC) 03/28/2020   Medication noncompliance due to cognitive impairment 03/28/2020   Chronic systolic CHF (congestive heart failure) (HCC) 03/28/2020   CKD (chronic kidney disease) stage 4, GFR 15-29 ml/min (HCC) 06/10/2017   PSORIASIS, 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   Type 2 diabetes with complication (HCC) 08/03/2007   Hyperlipidemia associated with type 2 diabetes mellitus (HCC) 08/03/2007   Essential hypertension 08/03/2007    PCP: Myrlene Broker, MD  REFERRING PROVIDER: Tarry Kos, MD   REFERRING DIAG: [phone] (ICD-10-CM) - Effusion, right knee   THERAPY DIAG:  Muscle weakness (generalized)  Stiffness of left knee, not elsewhere classified  Other abnormalities of gait and mobility  Localized edema  Rationale for Evaluation and Treatment: Rehabilitation  ONSET DATE: Right knee scope, irrigation and debriement, major synovectomy 02/18/2023  SUBJECTIVE:   SUBJECTIVE STATEMENT: Pt reports no knee pain upon arrival  PERTINENT HISTORY: Right knee scope 02/18/2023 PMH:RA,OSA,Obesity,CHF,CKD,DM2 To wear knee immobilizer until quad function improves  PAIN:  NPRS scale:   0/10 upon arrival, little soreness Pain  location: Rt knee anterior Pain description: pulling Aggravating factors: sitting 90 90 deg without leg supported, walking Relieving factors: resting with leg supported and turning foot out some   PRECAUTIONS: None  RED FLAGS: None   WEIGHT BEARING RESTRICTIONS: No  FALLS:  Has patient fallen in last 6 months? 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  DIAGNOSTIC FINDINGS:  03/09/2023 review:  XR 01/05/23 IMPRESSION: Mild osteoarthritis with moderate knee joint effusion.  PATIENT SURVEYS:  03/09/2023 FOTO 57% functional goal is 71%  COGNITION: 03/09/2023 Overall cognitive status: Within functional limits for tasks assessed      EDEMA:  03/09/2023 Moderate edema noted in Rt knee and Lower leg   LOWER EXTREMITY ROM:   AROM/PROM Right eval Right 03/13/2023 Right 03/19/23 Right 04/01/2023  Hip flexion      Hip extension      Hip abduction      Hip adduction      Hip internal rotation      Hip external rotation      Knee flexion 60/80 Passive 97    Knee extension 15/10 Passive lacks 10 Supine P: -8* A: quad set -10*   Ankle dorsiflexion      Ankle plantarflexion      Ankle inversion      Ankle eversion       (Blank rows = not tested)  LOWER EXTREMITY MMT:  MMT Right eval Left 04/06/23  Hip flexion 2   Hip extension    Hip abduction    Hip adduction    Hip internal rotation    Hip external rotation    Knee flexion 3 4  Knee extension 2 3  Ankle dorsiflexion    Ankle plantarflexion    Ankle inversion    Ankle eversion     (Blank rows = not tested)  GAIT: 04/01/2023:   FWW or crutches reported.   FWW into clinc.   03/09/2023 Comments: Currently using RW and in knee immobilizer druing  ambulation. He did not use AD PLOF                   TODAY'S TREATMENT:                                                           DATE:  04/06/2023: Therapeutic Exercise: Nustep seat 15 level 8 with BLEs only for 8 min Seated Rt knee LAQ attempt to available range, then strap assisted end range then eccentric lowering x 10  Supine SLR with min A from strap and slow eccentric lower 2X10 Supine TKE stretch 3 min heel prop   TherActivity Leg press double leg  93 lbs 2 x 10 slow lowering, single leg Rt 37 lbs 3 x 10 Step up with bilateral hands on bar x 10 forward and X 10 lateral Rt leg WB 6 inch step   Vasopnuematic  X 10 min, medium compression, 34 deg to Rt knee in elevation  04/01/2023: Therapeutic Exercise: Nustep seat 15 level 8 with BLEs only for 8 min Discussed seated hang into Rt knee flexion to encourage relaxation of guarding from Rt quad.  Performed for 2 mins in clinic. Time spent in education for home use. Seated Rt foot heel slide into knee flexion 15 sec hold x 2 Seated Rt knee LAQ attempt to available range, then strap assisted end range then eccentric lowering x 10   NMES to Rt quad russion 5 sec on/off for 4 mins in seated isometric extension in mid range, supine quad set 4 mins Rt leg.   TherActivity Leg press double leg 87 lbs x 15 slow lowering, single leg Rt 37 lbs 2 x 15 Step up with bilateral hands on bar x 10 Rt leg WB 4 inch step   Vasopnuematic  X 10 min, medium compression, 34 deg to Rt knee in elevation   TODAY'S TREATMENT:                                                           DATE: 03/30/2023: Therapeutic Exercise: Nustep seat 15 level 8 with BLEs only for 8 min Gait with SPC with gait belt and min A. Step to pattern due to weakness Leg press BLEs 81# 12 reps 2 sets, then Rt leg only 31# 3X10 Step ups with bilat UE support 2 inch step 2X10 leading with Rt leg Standing TKE green 2X10 Seated LAQ  with min A from other leg to complete 2nd half  ROM, 2X10 NMES russian Estim 4 pads to quads with co-contract mode, 5/5 sec on/off with seated quad sets X 5 min and standing SLR X 3 min  -Vasopnuematic device X 10 min, medium compression, 34 deg to Rt knee     PATIENT EDUCATION: 03/09/2023 Education details: HEP, PT plan of care Person educated: Patient Education method: Explanation, Demonstration, Verbal cues, and Handouts Education comprehension: verbalized understanding and needs further education   HOME EXERCISE PROGRAM: Access Code: [facility] URL: [email or web address] Date: 03/19/2023 Prepared by: [name] Faster  Exercises - Supine Quad Set  - 2 x daily - 6 x weekly - 2-3 sets - 10 reps - 5 sec hold - Supine Heel Slide with Strap  - 2 x daily - 6 x weekly - 2-3 sets - 10 reps - 5 hold - straight leg raise with help from strap  - 2 x daily - 6 x weekly - 1-2 sets - 10 reps - 10 seconds hold - Seated March  - 2 x daily - 6 x weekly - 2-3 sets - 10 reps - Seated Long Arc Quad  - 2 x daily - 6 x weekly - 2-3 sets - 10 reps - 3 hold - Long Sitting Quad Set with Towel Roll Under Heel  - 5 x daily - 7 x weekly - 2 sets - 10 reps - 5 seconds hold - Prone Knee Extension with Ankle Weight  - 2-3 x daily - 7 x weekly - 1 sets - 2-3  reps - 2 minutes hold - Prone Knee Flexion  - 2-3 x daily - 7 x weekly - 2-3 sets - 10 reps - 5 seconds hold - Prone Knee Extension Overpressure  - 2-3 x daily - 7 x weekly - 2-3 sets - 10 reps - 5 seconds hold   ASSESSMENT:  CLINICAL IMPRESSION:   He is slowly improving with strength and able to progress to 6 inch step ups today and progress weight on leg press. He can now perform seated knee extension against gravity so we discontinued NMES and will move toward more functional closed chain strength. He does remain limited by quad weakness and we will continue to work to address this with PT.  OBJECTIVE IMPAIRMENTS: decreased activity tolerance, difficulty walking, decreased balance,  decreased endurance, decreased mobility, decreased ROM, decreased strength, impaired flexibility, impaired LE use, and pain.  ACTIVITY LIMITATIONS: bending, lifting, carry, locomotion, cleaning, community activity, driving, and  occupation  PERSONAL FACTORS: see above PMH are also affecting patient's functional outcome.  REHAB POTENTIAL: Good  CLINICAL DECISION MAKING: Stable/uncomplicated  EVALUATION COMPLEXITY: Low    GOALS: Short term PT Goals Target date: 04/06/2023   Pt will be I and compliant with HEP. Baseline:  Goal status: Ongoing 03/16/2023   Long term PT goals Target date:06/01/2023   Pt will improve Rt knee ROM to [facility] (3-115 deg) to improve functional mobility Baseline: Goal status: Ongoing 03/16/2023 Pt will improve Rt hip/knee strength to at least 4+/5 MMT to improve functional strength Baseline: Goal status: Ongoing 03/16/2023 Pt will improve FOTO to at least 71% functional to show improved function Baseline: Goal status: Ongoing 03/16/2023 Pt will reduce pain to overall less than 2-3/10 with usual activity and feel ready to return to work activity. Baseline: Goal status: Ongoing 03/16/2023 Pt will be able to ambulate community distances at least 1000 ft WNL gait pattern without AD Baseline: Goal status: Ongoing 03/16/2023  PLAN: PT FREQUENCY: 1-3 times per week   PT DURATION: 6-12 weeks  PLANNED INTERVENTIONS (unless contraindicated): aquatic PT, Canalith repositioning, cryotherapy, Electrical stimulation, Iontophoresis with 4 mg/ml dexamethasome, Moist heat, traction, Ultrasound, gait training, Therapeutic exercise, balance training, neuromuscular re-education, patient/family education, prosthetic training, manual techniques, passive ROM, dry needling, taping, vasopnuematic device, vestibular, spinal manipulations, joint manipulations 97110-Therapeutic exercises, 97530- Therapeutic activity, 97112- Neuromuscular re-education, 97535- Self Care, and [id number]-  Manual therapy  PLAN FOR NEXT SESSION: Quad activation/strengthening.   Ivery Quale, PT, DPT 04/06/23 4:13 PM

## 2023-04-08 ENCOUNTER — Encounter: Payer: Self-pay | Admitting: Physical Therapy

## 2023-04-08 ENCOUNTER — Ambulatory Visit: Payer: BC Managed Care – PPO | Admitting: Physical Therapy

## 2023-04-08 DIAGNOSIS — R600 Localized edema: Secondary | ICD-10-CM

## 2023-04-08 DIAGNOSIS — M25662 Stiffness of left knee, not elsewhere classified: Secondary | ICD-10-CM

## 2023-04-08 DIAGNOSIS — R2689 Other abnormalities of gait and mobility: Secondary | ICD-10-CM

## 2023-04-08 DIAGNOSIS — M6281 Muscle weakness (generalized): Secondary | ICD-10-CM | POA: Diagnosis not present

## 2023-04-08 NOTE — Therapy (Signed)
OUTPATIENT PHYSICAL THERAPY TREATMENT   Patient Name: Johnny [name] MRN: [id number] DOB:04-29-1956, 67 y.o., male Today's Date: 04/08/2023  END OF SESSION:  PT End of Session - 04/08/23 1622     Visit Number 10    Number of Visits 20    Date for PT Re-Evaluation 06/01/23    Authorization Type BCBS 20% coinsurance    PT Start Time 1550    PT Stop Time 1630    PT Time Calculation (min) 40 min    Activity Tolerance Patient tolerated treatment well    Behavior During Therapy [facility] for tasks assessed/performed                  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   Qualifier: Diagnosis of  By: Debby Bud MD, Rosalyn Gess    Sleep apnea    Type II diabetes mellitus with renal manifestations, uncontrolled 08/03/2007   Qualifier: Diagnosis of  By: Debby Bud MD, Rosalyn Gess  Med metformin    Past Surgical History:  Procedure Laterality Date   CARDIOVERSION N/A 07/18/2019   Procedure: CARDIOVERSION;  Surgeon: Lars Masson, MD;  Location: [facility] ENDOSCOPY;  Service: Cardiovascular;  Laterality: N/A;   IRRIGATION AND DEBRIDEMENT KNEE Right 02/18/2023   Procedure: RIGHT KNEE ARTHROSCOPIC IRRIGATION AND DEBRIDEMENT;  Surgeon: Tarry Kos, MD;  Location: [facility] ORS;  Service: Orthopedics;  Laterality: Right;   TEE WITHOUT CARDIOVERSION N/A 07/18/2019   Procedure: TRANSESOPHAGEAL ECHOCARDIOGRAM (TEE);  Surgeon: Lars Masson, MD;  Location: [facility] ENDOSCOPY;  Service:  Cardiovascular;  Laterality: N/A;   TOOTH EXTRACTION     XI ROBOTIC ASSISTED SIMPLE PROSTATECTOMY N/A 05/31/2020   Procedure: XI ROBOTIC ASSISTED SIMPLE PROSTATECTOMY-SUPRAPUBIC;  Surgeon: Malen Gauze, MD;  Location: [facility] ORS;  Service: Urology;  Laterality: N/A;   Patient Active Problem List   Diagnosis Date Noted   Rheumatoid factor positive 02/10/2023   Knee pain, right 01/21/2023   Hyperlipidemia 01/20/2023   Effusion, right knee 01/20/2023   Hypercoagulable state due to typical atrial flutter (HCC) 01/13/2023   Venous stasis of lower extremity 01/09/2023   BPH with obstruction/lower urinary tract symptoms 05/31/2020   Paroxysmal atrial flutter (HCC)    Adrenal nodule (HCC) 03/28/2020   OSA (obstructive sleep apnea) 03/28/2020   Morbid obesity (HCC) 03/28/2020   Medication noncompliance due to cognitive impairment 03/28/2020   Chronic systolic CHF (congestive heart failure) (HCC) 03/28/2020   CKD (chronic kidney disease) stage 4, GFR 15-29 ml/min (HCC) 06/10/2017   PSORIASIS, 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   Type 2 diabetes with complication (HCC) 08/03/2007   Hyperlipidemia associated with type 2 diabetes mellitus (HCC) 08/03/2007   Essential hypertension 08/03/2007    PCP: Myrlene Broker, MD  REFERRING PROVIDER: Tarry Kos, MD   REFERRING DIAG: [phone] (ICD-10-CM) - Effusion, right knee   THERAPY DIAG:  Muscle weakness (generalized)  Stiffness of left knee, not elsewhere  classified  Other abnormalities of gait and mobility  Localized edema  Rationale for Evaluation and Treatment: Rehabilitation  ONSET DATE: Right knee scope, irrigation and debriement, major synovectomy 02/18/2023  SUBJECTIVE:   SUBJECTIVE STATEMENT: Pt reports no knee pain upon arrival  PERTINENT HISTORY: Right knee scope 02/18/2023 PMH:RA,OSA,Obesity,CHF,CKD,DM2 To wear knee immobilizer until quad function improves  PAIN:  NPRS scale:   0/10 upon arrival, little soreness Pain  location: Rt knee anterior Pain description: pulling Aggravating factors: sitting 90 90 deg without leg supported, walking Relieving factors: resting with leg supported and turning foot out some   PRECAUTIONS: None  RED FLAGS: None   WEIGHT BEARING RESTRICTIONS: No  FALLS:  Has patient fallen in last 6 months? 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  DIAGNOSTIC FINDINGS:  03/09/2023 review:  XR 01/05/23 IMPRESSION: Mild osteoarthritis with moderate knee joint effusion.  PATIENT SURVEYS:  03/09/2023 FOTO 57% functional goal is 71%  COGNITION: 03/09/2023 Overall cognitive status: Within functional limits for tasks assessed      EDEMA:  03/09/2023 Moderate edema noted in Rt knee and Lower leg   LOWER EXTREMITY ROM:   AROM/PROM Right eval Right 03/13/2023 Right 03/19/23 Right 04/01/2023  Hip flexion      Hip extension      Hip abduction      Hip adduction      Hip internal rotation      Hip external rotation      Knee flexion 60/80 Passive 97    Knee extension 15/10 Passive lacks 10 Supine P: -8* A: quad set -10*   Ankle dorsiflexion      Ankle plantarflexion      Ankle inversion      Ankle eversion       (Blank rows = not tested)  LOWER EXTREMITY MMT:  MMT Right eval Left 04/06/23  Hip flexion 2   Hip extension    Hip abduction    Hip adduction    Hip internal rotation    Hip external rotation    Knee flexion 3 4  Knee extension 2 3  Ankle dorsiflexion    Ankle plantarflexion    Ankle inversion    Ankle eversion     (Blank rows = not tested)  GAIT: 04/01/2023:   FWW or crutches reported.   FWW into clinc.   03/09/2023 Comments: Currently using RW and in knee immobilizer druing  ambulation. He did not use AD PLOF                   TODAY'S TREATMENT:                                                           DATE:  04/06/2023: Therapeutic Exercise: Nustep seat 15 level 8 with BLEs only for 8 min Seated Rt knee LAQ  to available range, then strap assisted end range then eccentric lowering3 x 10  Supine SLR X 10, no assist needed today Supine TKE stretch 3 min heel prop   TherActivity Leg press double leg 93 lbs  3 x 10 slow lowering, single leg Rt 37 lbs 3 x 10 Step up with bilateral hands on bar x 15 forward and X 15 lateral Rt leg WB 6 inch step   Vasopnuematic  X 10 min, medium compression, 34 deg to Rt knee in elevation  04/01/2023: Therapeutic Exercise: Nustep seat 15 level 8 with BLEs only for 8 min Discussed seated hang into Rt knee flexion to encourage relaxation of guarding from Rt quad.  Performed for 2 mins in clinic. Time spent in education for home use. Seated Rt foot heel slide into knee flexion 15 sec hold x 2 Seated Rt knee LAQ attempt to available range, then strap assisted end range then eccentric lowering x 10   NMES to Rt quad russion 5 sec on/off for 4 mins in seated isometric extension in mid range, supine quad set 4 mins Rt leg.   TherActivity Leg press double leg 87 lbs x 15 slow lowering, single leg Rt 37 lbs 2 x 15 Step up with bilateral hands on bar x 10 Rt leg WB 4 inch step   Vasopnuematic  X 10 min, medium compression, 34 deg to Rt knee in elevation   TODAY'S TREATMENT:                                                           DATE: 03/30/2023: Therapeutic Exercise: Nustep seat 15 level 8 with BLEs only for 8 min Gait with SPC with gait belt and min A. Step to pattern due to weakness Leg press BLEs 81# 12 reps 2 sets, then Rt leg only 31# 3X10 Step ups with bilat UE support 2 inch step 2X10 leading with Rt leg Standing TKE green 2X10 Seated LAQ  with min A from other leg to complete 2nd half ROM, 2X10 NMES russian Estim 4  pads to quads with co-contract mode, 5/5 sec on/off with seated quad sets X 5 min and standing SLR X 3 min  -Vasopnuematic device X 10 min, medium compression, 34 deg to Rt knee     PATIENT EDUCATION: 03/09/2023 Education details: HEP, PT plan of care Person educated: Patient Education method: Explanation, Demonstration, Verbal cues, and Handouts Education comprehension: verbalized understanding and needs further education   HOME EXERCISE PROGRAM: Access Code: [facility] URL: [email or web address] Date: 03/19/2023 Prepared by: [name] Faster  Exercises - Supine Quad Set  - 2 x daily - 6 x weekly - 2-3 sets - 10 reps - 5 sec hold - Supine Heel Slide with Strap  - 2 x daily - 6 x weekly - 2-3 sets - 10 reps - 5 hold - straight leg raise with help from strap  - 2 x daily - 6 x weekly - 1-2 sets - 10 reps - 10 seconds hold - Seated March  - 2 x daily - 6 x weekly - 2-3 sets - 10 reps - Seated Long Arc Quad  - 2 x daily - 6 x weekly - 2-3 sets - 10 reps - 3 hold - Long Sitting Quad Set with Towel Roll Under Heel  - 5 x daily - 7 x weekly - 2 sets - 10 reps - 5 seconds hold - Prone Knee Extension with Ankle Weight  - 2-3 x daily - 7 x weekly - 1 sets - 2-3 reps -  2 minutes hold - Prone Knee Flexion  - 2-3 x daily - 7 x weekly - 2-3 sets - 10 reps - 5 seconds hold - Prone Knee Extension Overpressure  - 2-3 x daily - 7 x weekly - 2-3 sets - 10 reps - 5 seconds hold   ASSESSMENT:  CLINICAL IMPRESSION:   He was able to perform Supine SLR today for first time without assistance showing improved overall quad strength. We will continue to benefit from PT as he does remain limited by quad weakness and we will continue to work to address this with PT.  OBJECTIVE IMPAIRMENTS: decreased activity tolerance, difficulty walking, decreased balance, decreased endurance, decreased mobility, decreased ROM, decreased strength, impaired flexibility, impaired LE use, and pain.  ACTIVITY  LIMITATIONS: bending, lifting, carry, locomotion, cleaning, community activity, driving, and  occupation  PERSONAL FACTORS: see above PMH are also affecting patient's functional outcome.  REHAB POTENTIAL: Good  CLINICAL DECISION MAKING: Stable/uncomplicated  EVALUATION COMPLEXITY: Low    GOALS: Short term PT Goals Target date: 04/06/2023   Pt will be I and compliant with HEP. Baseline:  Goal status: Ongoing 03/16/2023   Long term PT goals Target date:06/01/2023   Pt will improve Rt knee ROM to [facility] (3-115 deg) to improve functional mobility Baseline: Goal status: Ongoing 03/16/2023 Pt will improve Rt hip/knee strength to at least 4+/5 MMT to improve functional strength Baseline: Goal status: Ongoing 03/16/2023 Pt will improve FOTO to at least 71% functional to show improved function Baseline: Goal status: Ongoing 03/16/2023 Pt will reduce pain to overall less than 2-3/10 with usual activity and feel ready to return to work activity. Baseline: Goal status: Ongoing 03/16/2023 Pt will be able to ambulate community distances at least 1000 ft WNL gait pattern without AD Baseline: Goal status: Ongoing 03/16/2023  PLAN: PT FREQUENCY: 1-3 times per week   PT DURATION: 6-12 weeks  PLANNED INTERVENTIONS (unless contraindicated): aquatic PT, Canalith repositioning, cryotherapy, Electrical stimulation, Iontophoresis with 4 mg/ml dexamethasome, Moist heat, traction, Ultrasound, gait training, Therapeutic exercise, balance training, neuromuscular re-education, patient/family education, prosthetic training, manual techniques, passive ROM, dry needling, taping, vasopnuematic device, vestibular, spinal manipulations, joint manipulations 97110-Therapeutic exercises, 97530- Therapeutic activity, 97112- Neuromuscular re-education, 97535- Self Care, and 16606- Manual therapy  PLAN FOR NEXT SESSION: Quad activation/strengthening.   Ivery Quale, PT, DPT 04/08/23 4:27 PM

## 2023-04-13 ENCOUNTER — Ambulatory Visit (INDEPENDENT_AMBULATORY_CARE_PROVIDER_SITE_OTHER): Payer: BC Managed Care – PPO | Admitting: Rehabilitative and Restorative Service Providers"

## 2023-04-13 ENCOUNTER — Encounter: Payer: Self-pay | Admitting: Rehabilitative and Restorative Service Providers"

## 2023-04-13 DIAGNOSIS — M25662 Stiffness of left knee, not elsewhere classified: Secondary | ICD-10-CM | POA: Diagnosis not present

## 2023-04-13 DIAGNOSIS — R2689 Other abnormalities of gait and mobility: Secondary | ICD-10-CM | POA: Diagnosis not present

## 2023-04-13 DIAGNOSIS — M6281 Muscle weakness (generalized): Secondary | ICD-10-CM

## 2023-04-13 DIAGNOSIS — R600 Localized edema: Secondary | ICD-10-CM | POA: Diagnosis not present

## 2023-04-13 NOTE — Therapy (Signed)
OUTPATIENT PHYSICAL THERAPY TREATMENT   Patient Name: Johnny [name] MRN: [id number] DOB:1955/11/29, 67 y.o., male Today's Date: 04/13/2023  END OF SESSION:  PT End of Session - 04/13/23 1146     Visit Number 11    Number of Visits 20    Date for PT Re-Evaluation 06/01/23    Authorization Type BCBS 20% coinsurance    PT Start Time 1142    PT Stop Time 1222    PT Time Calculation (min) 40 min    Activity Tolerance Patient tolerated treatment well    Behavior During Therapy WFL for tasks assessed/performed                   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   Qualifier: Diagnosis of  By: Debby Bud MD, Rosalyn Gess    Sleep apnea    Type II diabetes mellitus with renal manifestations, uncontrolled 08/03/2007   Qualifier: Diagnosis of  By: Debby Bud MD, Rosalyn Gess  Med metformin    Past Surgical History:  Procedure Laterality Date   CARDIOVERSION N/A 07/18/2019   Procedure: CARDIOVERSION;  Surgeon: Lars Masson, MD;  Location: [facility] ENDOSCOPY;  Service: Cardiovascular;  Laterality: N/A;   IRRIGATION AND DEBRIDEMENT KNEE Right 02/18/2023   Procedure: RIGHT KNEE ARTHROSCOPIC IRRIGATION AND DEBRIDEMENT;  Surgeon: Tarry Kos, MD;  Location: [facility] ORS;  Service: Orthopedics;  Laterality: Right;   TEE WITHOUT CARDIOVERSION N/A 07/18/2019   Procedure: TRANSESOPHAGEAL ECHOCARDIOGRAM (TEE);  Surgeon: Lars Masson, MD;  Location: [facility] ENDOSCOPY;  Service:  Cardiovascular;  Laterality: N/A;   TOOTH EXTRACTION     XI ROBOTIC ASSISTED SIMPLE PROSTATECTOMY N/A 05/31/2020   Procedure: XI ROBOTIC ASSISTED SIMPLE PROSTATECTOMY-SUPRAPUBIC;  Surgeon: Malen Gauze, MD;  Location: [facility] ORS;  Service: Urology;  Laterality: N/A;   Patient Active Problem List   Diagnosis Date Noted   Rheumatoid factor positive 02/10/2023   Knee pain, right 01/21/2023   Hyperlipidemia 01/20/2023   Effusion, right knee 01/20/2023   Hypercoagulable state due to typical atrial flutter (HCC) 01/13/2023   Venous stasis of lower extremity 01/09/2023   BPH with obstruction/lower urinary tract symptoms 05/31/2020   Paroxysmal atrial flutter (HCC)    Adrenal nodule (HCC) 03/28/2020   OSA (obstructive sleep apnea) 03/28/2020   Morbid obesity (HCC) 03/28/2020   Medication noncompliance due to cognitive impairment 03/28/2020   Chronic systolic CHF (congestive heart failure) (HCC) 03/28/2020   CKD (chronic kidney disease) stage 4, GFR 15-29 ml/min (HCC) 06/10/2017   PSORIASIS, 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   Type 2 diabetes with complication (HCC) 08/03/2007   Hyperlipidemia associated with type 2 diabetes mellitus (HCC) 08/03/2007   Essential hypertension 08/03/2007    PCP: Myrlene Broker, MD  REFERRING PROVIDER: Tarry Kos, MD   REFERRING DIAG: [phone] (ICD-10-CM) - Effusion, right knee   THERAPY DIAG:  Muscle weakness (generalized)  Stiffness of left knee, not  elsewhere classified  Other abnormalities of gait and mobility  Localized edema  Rationale for Evaluation and Treatment: Rehabilitation  ONSET DATE: Right knee scope, irrigation and debriement, major synovectomy 02/18/2023  SUBJECTIVE:   SUBJECTIVE STATEMENT: Pt indicated soreness in knee and muscle over last few days.  Reported tightness with prolonged walking.  Has been working on supine SLR.   PERTINENT HISTORY: Right knee scope 02/18/2023 PMH:RA,OSA,Obesity,CHF,CKD,DM2 To wear knee immobilizer  until quad function improves  PAIN:  NPRS scale:   0/10 Pain location: Rt knee anterior Pain description: pulling Aggravating factors: sitting 90 90 deg without leg supported, walking Relieving factors: resting with leg supported and turning foot out some   PRECAUTIONS: None  RED FLAGS: None   WEIGHT BEARING RESTRICTIONS: No  FALLS:  Has patient fallen in last 6 months? 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  DIAGNOSTIC FINDINGS:  03/09/2023 review:  XR 01/05/23 IMPRESSION: Mild osteoarthritis with moderate knee joint effusion.  PATIENT SURVEYS:  03/09/2023 FOTO 57% functional goal is 71%  COGNITION: 03/09/2023 Overall cognitive status: Within functional limits for tasks assessed      EDEMA:  03/09/2023 Moderate edema noted in Rt knee and Lower leg   LOWER EXTREMITY ROM:   AROM/PROM Right eval Right 03/13/2023 Right 03/19/23 Right 04/01/2023  Hip flexion      Hip extension      Hip abduction      Hip adduction      Hip internal rotation      Hip external rotation      Knee flexion 60/80 Passive 97    Knee extension 15/10 Passive lacks 10 Supine P: -8* A: quad set -10*   Ankle dorsiflexion      Ankle plantarflexion      Ankle inversion      Ankle eversion       (Blank rows = not tested)  LOWER EXTREMITY MMT:  MMT Right eval Left 04/06/23 Right 04/13/2023 Left 04/13/2023  Hip flexion 2     Hip extension      Hip abduction      Hip adduction      Hip internal rotation      Hip external rotation      Knee flexion 3 4    Knee extension 2 3 13.3, 12.7 lbs 5/5 93.6 lbs  Ankle dorsiflexion      Ankle plantarflexion      Ankle inversion      Ankle eversion       (Blank rows = not  tested)  GAIT: 04/01/2023:   FWW or crutches reported.   FWW into clinc.   03/09/2023 Comments: Currently using RW and in knee immobilizer druing ambulation. He did not use AD PLOF                   TODAY'S TREATMENT:                                                           DATE:   04/13/2023: Therapeutic Exercise: Nustep seat 15 level 8 with  BLEs only for 10 min Leg press double leg 100 lbs 2 x 15 slow lowering, single leg Rt 37 lbs 2 x 15 c slow lowering Standing incline gastroc stretch bilateral 30 sec x 3  Seated Rt leg LAQ AROM x 5 Seated Rt leg quad set 5 sec hold x 5 Seated Rt leg quad set with SLR attempt just off floor 2 x 5 (cues for mixing in at home)   Neuro Re-ed Tandem stance 1 min x 2 bilateral with occasional HHA on bars.  Forward/back church pew anterior/posterior ankle weight shift 1 min in // bars with occasional HHA Time spent with visual demonstration and verbal cues for activity.     TODAY'S TREATMENT:                                                           DATE:  04/06/2023: Therapeutic Exercise: Nustep seat 15 level 8 with BLEs only for 8 min Seated Rt knee LAQ  to available range, then strap assisted end range then eccentric lowering3 x 10  Supine SLR X 10, no assist needed today Supine TKE stretch 3 min heel prop   TherActivity Leg press double leg 93 lbs 3 x 10 slow lowering, single leg Rt 37 lbs 3 x 10 Step up with bilateral hands on bar x 15 forward and X 15 lateral Rt leg WB 6 inch step   Vasopnuematic  X 10 min, medium compression, 34 deg to Rt knee in elevation  TODAY'S TREATMENT:                                                           DATE:  04/01/2023: Therapeutic Exercise: Nustep seat 15 level 8 with BLEs only for 8 min Discussed seated hang into Rt knee flexion to encourage relaxation of guarding from Rt quad.  Performed for 2 mins in clinic. Time spent in education for home use. Seated Rt foot heel slide into knee flexion 15 sec  hold x 2 Seated Rt knee LAQ attempt to available range, then strap assisted end range then eccentric lowering x 10   NMES to Rt quad russion 5 sec on/off for 4 mins in seated isometric extension in mid range, supine quad set 4 mins Rt leg.   TherActivity Leg press double leg 87 lbs x 15 slow lowering, single leg Rt 37 lbs 2 x 15 Step up with bilateral hands on bar x 10 Rt leg WB 4 inch step   Vasopnuematic  X 10 min, medium compression, 34 deg to Rt knee in elevation  PATIENT EDUCATION: 03/09/2023 Education details: HEP, PT plan of care Person educated: Patient Education method: Explanation, Demonstration, Verbal cues, and Handouts Education comprehension: verbalized understanding and needs further education   HOME EXERCISE PROGRAM: Access Code: [facility] URL: [email or web address] Date: 03/19/2023 Prepared by: [name] Faster  Exercises - Supine Quad Set  - 2 x daily - 6 x weekly - 2-3 sets - 10 reps - 5 sec hold - Supine Heel Slide with Strap  - 2 x daily - 6 x weekly - 2-3 sets - 10 reps - 5 hold -  straight leg raise with help from strap  - 2 x daily - 6 x weekly - 1-2 sets - 10 reps - 10 seconds hold - Seated March  - 2 x daily - 6 x weekly - 2-3 sets - 10 reps - Seated Long Arc Quad  - 2 x daily - 6 x weekly - 2-3 sets - 10 reps - 3 hold - Long Sitting Quad Set with Towel Roll Under Heel  - 5 x daily - 7 x weekly - 2 sets - 10 reps - 5 seconds hold - Prone Knee Extension with Ankle Weight  - 2-3 x daily - 7 x weekly - 1 sets - 2-3 reps - 2 minutes hold - Prone Knee Flexion  - 2-3 x daily - 7 x weekly - 2-3 sets - 10 reps - 5 seconds hold - Prone Knee Extension Overpressure  - 2-3 x daily - 7 x weekly - 2-3 sets - 10 reps - 5 seconds hold   ASSESSMENT:  CLINICAL IMPRESSION:   Dynamometry testing for knee extension in sitting revealed large difference between legs but able to establish baseline for future testing. Activation of Rt quad did continue to show slow  but some steady improvement in quality. Strength deficits play large factor in WB stability and ambulation. Continued skilled PT services warranted.   OBJECTIVE IMPAIRMENTS: decreased activity tolerance, difficulty walking, decreased balance, decreased endurance, decreased mobility, decreased ROM, decreased strength, impaired flexibility, impaired LE use, and pain.  ACTIVITY LIMITATIONS: bending, lifting, carry, locomotion, cleaning, community activity, driving, and  occupation  PERSONAL FACTORS: see above PMH are also affecting patient's functional outcome.  REHAB POTENTIAL: Good  CLINICAL DECISION MAKING: Stable/uncomplicated  EVALUATION COMPLEXITY: Low    GOALS: Short term PT Goals Target date: 04/06/2023   Pt will be I and compliant with HEP. Baseline:  Goal status: Ongoing 03/16/2023   Long term PT goals Target date:06/01/2023   Pt will improve Rt knee ROM to [facility] (3-115 deg) to improve functional mobility Baseline: Goal status: Ongoing 03/16/2023 Pt will improve Rt hip/knee strength to at least 4+/5 MMT to improve functional strength Baseline: Goal status: Ongoing 03/16/2023 Pt will improve FOTO to at least 71% functional to show improved function Baseline: Goal status: Ongoing 03/16/2023 Pt will reduce pain to overall less than 2-3/10 with usual activity and feel ready to return to work activity. Baseline: Goal status: Ongoing 03/16/2023 Pt will be able to ambulate community distances at least 1000 ft WNL gait pattern without AD Baseline: Goal status: Ongoing 03/16/2023  PLAN: PT FREQUENCY: 1-3 times per week   PT DURATION: 6-12 weeks  PLANNED INTERVENTIONS (unless contraindicated): aquatic PT, Canalith repositioning, cryotherapy, Electrical stimulation, Iontophoresis with 4 mg/ml dexamethasome, Moist heat, traction, Ultrasound, gait training, Therapeutic exercise, balance training, neuromuscular re-education, patient/family education, prosthetic training, manual  techniques, passive ROM, dry needling, taping, vasopnuematic device, vestibular, spinal manipulations, joint manipulations 97110-Therapeutic exercises, 97530- Therapeutic activity, 97112- Neuromuscular re-education, 97535- Self Care, and 16109- Manual therapy  PLAN FOR NEXT SESSION: Quad strengthening, balance improvements.    Chyrel Masson, PT, DPT, OCS, ATC 04/13/23  12:23 PM

## 2023-04-16 ENCOUNTER — Encounter: Payer: Self-pay | Admitting: Rehabilitative and Restorative Service Providers"

## 2023-04-16 ENCOUNTER — Other Ambulatory Visit: Payer: Self-pay | Admitting: Cardiology

## 2023-04-16 ENCOUNTER — Ambulatory Visit (INDEPENDENT_AMBULATORY_CARE_PROVIDER_SITE_OTHER): Payer: BC Managed Care – PPO | Admitting: Rehabilitative and Restorative Service Providers"

## 2023-04-16 DIAGNOSIS — R2689 Other abnormalities of gait and mobility: Secondary | ICD-10-CM

## 2023-04-16 DIAGNOSIS — M25662 Stiffness of left knee, not elsewhere classified: Secondary | ICD-10-CM

## 2023-04-16 DIAGNOSIS — M6281 Muscle weakness (generalized): Secondary | ICD-10-CM

## 2023-04-16 DIAGNOSIS — R600 Localized edema: Secondary | ICD-10-CM | POA: Diagnosis not present

## 2023-04-16 LAB — CBC WITH DIFFERENTIAL/PLATELET
Basophils Absolute: 0.1 10*3/uL (ref 0.0–0.2)
Basos: 1 %
EOS (ABSOLUTE): 0.1 10*3/uL (ref 0.0–0.4)
Eos: 1 %
Hematocrit: 40.6 % (ref 37.5–51.0)
Hemoglobin: 12.9 g/dL — ABNORMAL LOW (ref 13.0–17.7)
Immature Grans (Abs): 0 10*3/uL (ref 0.0–0.1)
Immature Granulocytes: 0 %
Lymphocytes Absolute: 1 10*3/uL (ref 0.7–3.1)
Lymphs: 14 %
MCH: 28.5 pg (ref 26.6–33.0)
MCHC: 31.8 g/dL (ref 31.5–35.7)
MCV: 90 fL (ref 79–97)
Monocytes Absolute: 0.7 10*3/uL (ref 0.1–0.9)
Monocytes: 10 %
Neutrophils Absolute: 5.5 10*3/uL (ref 1.4–7.0)
Neutrophils: 74 %
Platelets: 261 10*3/uL (ref 150–450)
RBC: 4.53 x10E6/uL (ref 4.14–5.80)
RDW: 16.3 % — ABNORMAL HIGH (ref 11.6–15.4)
WBC: 7.4 10*3/uL (ref 3.4–10.8)

## 2023-04-16 LAB — BASIC METABOLIC PANEL
BUN/Creatinine Ratio: 11 (ref 10–24)
BUN: 24 mg/dL (ref 8–27)
CO2: 22 mmol/L (ref 20–29)
Calcium: 9.3 mg/dL (ref 8.6–10.2)
Chloride: 107 mmol/L — ABNORMAL HIGH (ref 96–106)
Creatinine, Ser: 2.26 mg/dL — ABNORMAL HIGH (ref 0.76–1.27)
Glucose: 112 mg/dL — ABNORMAL HIGH (ref 70–99)
Potassium: 4.7 mmol/L (ref 3.5–5.2)
Sodium: 142 mmol/L (ref 134–144)
eGFR: 31 mL/min/{1.73_m2} — ABNORMAL LOW (ref >59)

## 2023-04-16 NOTE — Addendum Note (Signed)
Addended by: Luellen Pucker on: 04/16/2023 09:48 AM   Modules accepted: Orders

## 2023-04-16 NOTE — Therapy (Signed)
OUTPATIENT PHYSICAL THERAPY TREATMENT   Patient Name: Johnny [name] MRN: [id number] DOB:Sep 18, 1955, 67 y.o., male Today's Date: 04/16/2023  END OF SESSION:  PT End of Session - 04/16/23 0840     Visit Number 12    Number of Visits 20    Date for PT Re-Evaluation 06/01/23    Authorization Type [employer] 20% coinsurance    PT Start Time 0840    PT Stop Time 0920    PT Time Calculation (min) 40 min    Activity Tolerance Patient tolerated treatment well    Behavior During Therapy [facility] for tasks assessed/performed                    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   Qualifier: Diagnosis of  By: Debby Bud MD, Rosalyn Gess    Sleep apnea    Type II diabetes mellitus with renal manifestations, uncontrolled 08/03/2007   Qualifier: Diagnosis of  By: Debby Bud MD, Rosalyn Gess  Med metformin    Past Surgical History:  Procedure Laterality Date   CARDIOVERSION N/A 07/18/2019   Procedure: CARDIOVERSION;  Surgeon: Lars Masson, MD;  Location: [facility] ENDOSCOPY;  Service: Cardiovascular;  Laterality: N/A;   IRRIGATION AND DEBRIDEMENT KNEE Right 02/18/2023   Procedure: RIGHT KNEE ARTHROSCOPIC IRRIGATION AND DEBRIDEMENT;  Surgeon: Tarry Kos, MD;  Location: [facility] ORS;  Service: Orthopedics;  Laterality: Right;   TEE WITHOUT CARDIOVERSION N/A 07/18/2019   Procedure: TRANSESOPHAGEAL ECHOCARDIOGRAM (TEE);  Surgeon: Lars Masson, MD;  Location: [facility] ENDOSCOPY;  Service:  Cardiovascular;  Laterality: N/A;   TOOTH EXTRACTION     XI ROBOTIC ASSISTED SIMPLE PROSTATECTOMY N/A 05/31/2020   Procedure: XI ROBOTIC ASSISTED SIMPLE PROSTATECTOMY-SUPRAPUBIC;  Surgeon: Malen Gauze, MD;  Location: [facility] ORS;  Service: Urology;  Laterality: N/A;   Patient Active Problem List   Diagnosis Date Noted   Rheumatoid factor positive 02/10/2023   Knee pain, right 01/21/2023   Hyperlipidemia 01/20/2023   Effusion, right knee 01/20/2023   Hypercoagulable state due to typical atrial flutter (HCC) 01/13/2023   Venous stasis of lower extremity 01/09/2023   BPH with obstruction/lower urinary tract symptoms 05/31/2020   Paroxysmal atrial flutter (HCC)    Adrenal nodule (HCC) 03/28/2020   OSA (obstructive sleep apnea) 03/28/2020   Morbid obesity (HCC) 03/28/2020   Medication noncompliance due to cognitive impairment 03/28/2020   Chronic systolic CHF (congestive heart failure) (HCC) 03/28/2020   CKD (chronic kidney disease) stage 4, GFR 15-29 ml/min (HCC) 06/10/2017   PSORIASIS, 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   Type 2 diabetes with complication (HCC) 08/03/2007   Hyperlipidemia associated with type 2 diabetes mellitus (HCC) 08/03/2007   Essential hypertension 08/03/2007    PCP: Myrlene Broker, MD  REFERRING PROVIDER: Tarry Kos, MD   REFERRING DIAG: [phone] (ICD-10-CM) - Effusion, right knee   THERAPY DIAG:  Muscle weakness (generalized)  Stiffness of left knee,  not elsewhere classified  Other abnormalities of gait and mobility  Localized edema  Rationale for Evaluation and Treatment: Rehabilitation  ONSET DATE: Right knee scope, irrigation and debriement, major synovectomy 02/18/2023  SUBJECTIVE:   SUBJECTIVE STATEMENT: Pt indicated no specific complaints upon arrival today.  Discussed cane tip for wooden cane so he didn't have to use the quad cane.   PERTINENT HISTORY: Right knee scope 02/18/2023 PMH:RA,OSA,Obesity,CHF,CKD,DM2 To wear knee immobilizer until  quad function improves  PAIN:  NPRS scale:   0/10 Pain location: Rt knee anterior Pain description: pulling Aggravating factors: sitting 90 90 deg without leg supported, walking Relieving factors: resting with leg supported and turning foot out some   PRECAUTIONS: None  RED FLAGS: None   WEIGHT BEARING RESTRICTIONS: No  FALLS:  Has patient fallen in last 6 months? 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  DIAGNOSTIC FINDINGS:  03/09/2023 review:  XR 01/05/23 IMPRESSION: Mild osteoarthritis with moderate knee joint effusion.  PATIENT SURVEYS:  03/09/2023 FOTO 57% functional goal is 71%  COGNITION: 03/09/2023 Overall cognitive status: Within functional limits for tasks assessed      EDEMA:  03/09/2023 Moderate edema noted in Rt knee and Lower leg   LOWER EXTREMITY ROM:   AROM/PROM Right eval Right 03/13/2023 Right 03/19/23 Right 04/01/2023  Hip flexion      Hip extension      Hip abduction      Hip adduction      Hip internal rotation      Hip external rotation      Knee flexion 60/80 Passive 97    Knee extension 15/10 Passive lacks 10 Supine P: -8* A: quad set -10*   Ankle dorsiflexion      Ankle plantarflexion      Ankle inversion      Ankle eversion       (Blank rows = not tested)  LOWER EXTREMITY MMT:  MMT Right eval Left 04/06/23 Right 04/13/2023 Left 04/13/2023  Hip flexion 2     Hip extension      Hip abduction      Hip adduction      Hip internal rotation      Hip external rotation      Knee flexion 3 4    Knee extension 2 3 13.3, 12.7 lbs 5/5 93.6 lbs  Ankle dorsiflexion      Ankle plantarflexion      Ankle inversion      Ankle eversion       (Blank rows = not  tested)  GAIT: 04/16/2023: SPC use in Rt UE per Pt preference despite encouragement for use in Lt arm.   04/01/2023:   FWW or crutches reported.   FWW into clinc.   03/09/2023 Comments: Currently using RW and in knee immobilizer druing ambulation. He did not use AD PLOF                   TODAY'S TREATMENT:  DATE:   04/16/2023: Therapeutic Exercise: Nustep seat 15 level 8 with BLEs only for 12 min Seated isometric on knee extension machine 5 sec on /off Rt leg x 12 in slot 3 (75-80 deg knee flexion approx) and 4 (65 deg knee flexion approx.)  TherActivity Leg press double leg 100 lbs 2 x 15 slow lowering, single leg Rt 37 lbs 2 x 15 c slow lowering Step up in // bars with Lt hand on bar 4 inch step x 15 WB on Rt leg  Neuro Re-ed Tandem ambulation in // bars fwd/back 10 ft x 5 each way with occasional to moderate HHA Forward/back [employer] pew anterior/posterior ankle weight shift 1 min in // bars with occasional HHA  TODAY'S TREATMENT:                                                           DATE:   04/13/2023: Therapeutic Exercise: Nustep seat 15 level 8 with BLEs only for 10 min Leg press double leg 100 lbs 2 x 15 slow lowering, single leg Rt 37 lbs 2 x 15 c slow lowering Standing incline gastroc stretch bilateral 30 sec x 3  Seated Rt leg LAQ AROM x 5 Seated Rt leg quad set 5 sec hold x 5 Seated Rt leg quad set with SLR attempt just off floor 2 x 5 (cues for mixing in at home)   Neuro Re-ed Tandem stance 1 min x 2 bilateral with occasional HHA on bars.  Forward/back church pew anterior/posterior ankle weight shift 1 min in // bars with occasional HHA Time spent with visual demonstration and verbal cues for activity.     TODAY'S TREATMENT:                                                           DATE:  04/06/2023: Therapeutic Exercise: Nustep seat 15 level 8 with BLEs only for 8 min Seated Rt knee LAQ  to available  range, then strap assisted end range then eccentric lowering3 x 10  Supine SLR X 10, no assist needed today Supine TKE stretch 3 min heel prop   TherActivity Leg press double leg 93 lbs 3 x 10 slow lowering, single leg Rt 37 lbs 3 x 10 Step up with bilateral hands on bar x 15 forward and X 15 lateral Rt leg WB 6 inch step   Vasopnuematic  X 10 min, medium compression, 34 deg to Rt knee in elevation  PATIENT EDUCATION: 03/09/2023 Education details: HEP, PT plan of care Person educated: Patient Education method: Explanation, Demonstration, Verbal cues, and Handouts Education comprehension: verbalized understanding and needs further education   HOME EXERCISE PROGRAM: Access Code: [facility] URL: [email or web address] Date: 03/19/2023 Prepared by: [name] Faster  Exercises - Supine Quad Set  - 2 x daily - 6 x weekly - 2-3 sets - 10 reps - 5 sec hold - Supine Heel Slide with Strap  - 2 x daily - 6 x weekly - 2-3 sets - 10 reps - 5 hold - straight leg raise with help from strap  - 2 x daily - 6 x weekly - 1-2  sets - 10 reps - 10 seconds hold - Seated March  - 2 x daily - 6 x weekly - 2-3 sets - 10 reps - Seated Long Arc Quad  - 2 x daily - 6 x weekly - 2-3 sets - 10 reps - 3 hold - Long Sitting Quad Set with Towel Roll Under Heel  - 5 x daily - 7 x weekly - 2 sets - 10 reps - 5 seconds hold - Prone Knee Extension with Ankle Weight  - 2-3 x daily - 7 x weekly - 1 sets - 2-3 reps - 2 minutes hold - Prone Knee Flexion  - 2-3 x daily - 7 x weekly - 2-3 sets - 10 reps - 5 seconds hold - Prone Knee Extension Overpressure  - 2-3 x daily - 7 x weekly - 2-3 sets - 10 reps - 5 seconds hold   ASSESSMENT:  CLINICAL IMPRESSION:   Continued progressive strengthening program with fatigue noted but improved tolerance steadily.  Improving confidence in [facility] use.  Strength improvements to continue to help improve WB acceptance. Continued skilled PT services warranted   OBJECTIVE  IMPAIRMENTS: decreased activity tolerance, difficulty walking, decreased balance, decreased endurance, decreased mobility, decreased ROM, decreased strength, impaired flexibility, impaired LE use, and pain.  ACTIVITY LIMITATIONS: bending, lifting, carry, locomotion, cleaning, community activity, driving, and  occupation  PERSONAL FACTORS: see above PMH are also affecting patient's functional outcome.  REHAB POTENTIAL: Good  CLINICAL DECISION MAKING: Stable/uncomplicated  EVALUATION COMPLEXITY: Low    GOALS: Short term PT Goals Target date: 04/06/2023   Pt will be I and compliant with HEP. Baseline:  Goal status: Ongoing 03/16/2023   Long term PT goals Target date:06/01/2023   Pt will improve Rt knee ROM to [facility] (3-115 deg) to improve functional mobility Baseline: Goal status: Ongoing 03/16/2023 Pt will improve Rt hip/knee strength to at least 4+/5 MMT to improve functional strength Baseline: Goal status: Ongoing 03/16/2023 Pt will improve FOTO to at least 71% functional to show improved function Baseline: Goal status: Ongoing 03/16/2023 Pt will reduce pain to overall less than 2-3/10 with usual activity and feel ready to return to work activity. Baseline: Goal status: Ongoing 03/16/2023 Pt will be able to ambulate community distances at least 1000 ft WNL gait pattern without AD Baseline: Goal status: Ongoing 03/16/2023  PLAN: PT FREQUENCY: 1-3 times per week   PT DURATION: 6-12 weeks  PLANNED INTERVENTIONS (unless contraindicated): aquatic PT, Canalith repositioning, cryotherapy, Electrical stimulation, Iontophoresis with 4 mg/ml dexamethasome, Moist heat, traction, Ultrasound, gait training, Therapeutic exercise, balance training, neuromuscular re-education, patient/family education, prosthetic training, manual techniques, passive ROM, dry needling, taping, vasopnuematic device, vestibular, spinal manipulations, joint manipulations [id number]-Therapeutic exercises, 97530-  Therapeutic activity, 97112- Neuromuscular re-education, 97535- Self Care, and [id number]- Manual therapy  PLAN FOR NEXT SESSION: Cane practice, progressive strengthening in various postioning.    Chyrel Masson, PT, DPT, OCS, ATC 04/16/23  9:22 AM

## 2023-04-20 ENCOUNTER — Encounter: Payer: Self-pay | Admitting: Physical Therapy

## 2023-04-20 ENCOUNTER — Ambulatory Visit (INDEPENDENT_AMBULATORY_CARE_PROVIDER_SITE_OTHER): Payer: BC Managed Care – PPO | Admitting: Physical Therapy

## 2023-04-20 DIAGNOSIS — M6281 Muscle weakness (generalized): Secondary | ICD-10-CM | POA: Diagnosis not present

## 2023-04-20 DIAGNOSIS — R2689 Other abnormalities of gait and mobility: Secondary | ICD-10-CM | POA: Diagnosis not present

## 2023-04-20 DIAGNOSIS — R600 Localized edema: Secondary | ICD-10-CM

## 2023-04-20 DIAGNOSIS — M25662 Stiffness of left knee, not elsewhere classified: Secondary | ICD-10-CM | POA: Diagnosis not present

## 2023-04-20 NOTE — Therapy (Signed)
OUTPATIENT PHYSICAL THERAPY TREATMENT   Patient Name: MORRIS BERGSCHNEIDER MRN: [id number] DOB:02-19-1956, 67 y.o., male Today's Date: 04/20/2023  END OF SESSION:  PT End of Session - 04/20/23 1152     Visit Number 13    Number of Visits 20    Date for PT Re-Evaluation 06/01/23    Authorization Type [employer] 20% coinsurance    PT Start Time 1146    PT Stop Time 1227    PT Time Calculation (min) 41 min    Activity Tolerance Patient tolerated treatment well    Behavior During Therapy [facility] for tasks assessed/performed                     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   Qualifier: Diagnosis of  By: Debby Bud MD, Rosalyn Gess    Sleep apnea    Type II diabetes mellitus with renal manifestations, uncontrolled 08/03/2007   Qualifier: Diagnosis of  By: Debby Bud MD, Rosalyn Gess  Med metformin    Past Surgical History:  Procedure Laterality Date   CARDIOVERSION N/A 07/18/2019   Procedure: CARDIOVERSION;  Surgeon: Lars Masson, MD;  Location: [facility] ENDOSCOPY;  Service: Cardiovascular;  Laterality: N/A;   IRRIGATION AND DEBRIDEMENT KNEE Right 02/18/2023   Procedure: RIGHT KNEE ARTHROSCOPIC IRRIGATION AND DEBRIDEMENT;  Surgeon: Tarry Kos, MD;  Location: [facility] ORS;  Service: Orthopedics;  Laterality: Right;   TEE WITHOUT CARDIOVERSION N/A 07/18/2019   Procedure: TRANSESOPHAGEAL ECHOCARDIOGRAM (TEE);  Surgeon: Lars Masson, MD;  Location: [facility] ENDOSCOPY;   Service: Cardiovascular;  Laterality: N/A;   TOOTH EXTRACTION     XI ROBOTIC ASSISTED SIMPLE PROSTATECTOMY N/A 05/31/2020   Procedure: XI ROBOTIC ASSISTED SIMPLE PROSTATECTOMY-SUPRAPUBIC;  Surgeon: Malen Gauze, MD;  Location: [facility] ORS;  Service: Urology;  Laterality: N/A;   Patient Active Problem List   Diagnosis Date Noted   Rheumatoid factor positive 02/10/2023   Knee pain, right 01/21/2023   Hyperlipidemia 01/20/2023   Effusion, right knee 01/20/2023   Hypercoagulable state due to typical atrial flutter (HCC) 01/13/2023   Venous stasis of lower extremity 01/09/2023   BPH with obstruction/lower urinary tract symptoms 05/31/2020   Paroxysmal atrial flutter (HCC)    Adrenal nodule (HCC) 03/28/2020   OSA (obstructive sleep apnea) 03/28/2020   Morbid obesity (HCC) 03/28/2020   Medication noncompliance due to cognitive impairment 03/28/2020   Chronic systolic CHF (congestive heart failure) (HCC) 03/28/2020   CKD (chronic kidney disease) stage 4, GFR 15-29 ml/min (HCC) 06/10/2017   PSORIASIS, 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   Type 2 diabetes with complication (HCC) 08/03/2007   Hyperlipidemia associated with type 2 diabetes mellitus (HCC) 08/03/2007   Essential hypertension 08/03/2007    PCP: Myrlene Broker, MD  REFERRING PROVIDER: Tarry Kos, MD   REFERRING DIAG: [phone] (ICD-10-CM) - Effusion, right knee   THERAPY DIAG:  Muscle weakness (generalized)  Stiffness of left  knee, not elsewhere classified  Other abnormalities of gait and mobility  Localized edema  Rationale for Evaluation and Treatment: Rehabilitation  ONSET DATE: Right knee scope, irrigation and debriement, major synovectomy 02/18/2023  SUBJECTIVE:   SUBJECTIVE STATEMENT:  Nothing new since last time, still not used to using cane on the other side but Kathlene November said do what's comfortable. I was able to get on my stationary bike and ride it for the first time this weekend. Would give myself 60/100- biggest  concerns are being able to walk without a limp and strength.   PERTINENT HISTORY: Right knee scope 02/18/2023 PMH:RA,OSA,Obesity,CHF,CKD,DM2 To wear knee immobilizer until quad function improves  PAIN:  NPRS scale:   0/10 now Pain location:  Pain description:  Aggravating factors:  Relieving factors:    PRECAUTIONS: None  RED FLAGS: None   WEIGHT BEARING RESTRICTIONS: No  FALLS:  Has patient fallen in last 6 months? 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  DIAGNOSTIC FINDINGS:  03/09/2023 review:  XR 01/05/23 IMPRESSION: Mild osteoarthritis with moderate knee joint effusion.  PATIENT SURVEYS:  03/09/2023 FOTO 57% functional goal is 71%  COGNITION: 03/09/2023 Overall cognitive status: Within functional limits for tasks assessed      EDEMA:  03/09/2023 Moderate edema noted in Rt knee and Lower leg   LOWER EXTREMITY ROM:   AROM/PROM Right eval Right 03/13/2023 Right 03/19/23 Right 04/01/2023  Hip flexion      Hip extension      Hip abduction      Hip adduction      Hip internal rotation      Hip external rotation      Knee flexion 60/80 Passive 97    Knee extension 15/10 Passive lacks 10 Supine P: -8* A: quad set -10*   Ankle dorsiflexion      Ankle plantarflexion      Ankle inversion      Ankle eversion       (Blank rows = not tested)  LOWER EXTREMITY MMT:  MMT Right eval Left 04/06/23 Right 04/13/2023 Left 04/13/2023  Hip flexion 2     Hip extension      Hip abduction      Hip adduction      Hip internal rotation      Hip external rotation      Knee flexion 3 4    Knee extension 2 3 13.3, 12.7 lbs 5/5 93.6 lbs  Ankle dorsiflexion      Ankle plantarflexion      Ankle  inversion      Ankle eversion       (Blank rows = not tested)  GAIT: 04/16/2023: SPC use in Rt UE per Pt preference despite encouragement for use in Lt arm.   04/01/2023:   FWW or crutches reported.   FWW into clinc.   03/09/2023 Comments: Currently using RW and in knee immobilizer druing ambulation. He did not use AD PLOF                   TODAY'S TREATMENT:  DATE:      04/20/23  TherEx  Nustep L8 x12 minutes BLEs only  Shuttle LE press double leg: 100# 2x15 power up/slow lower  Shuttle LE press single leg 43# 2x10 power up/slow lower    NMR  Tandem gait forward/backward in // bars x4 laps 3 way taps blue foam pad in // bars 2x5 B SLS with one foot on 6 inch box 2x30 seconds B blue foam pad     04/16/2023: Therapeutic Exercise: Nustep seat 15 level 8 with BLEs only for 12 min Seated isometric on knee extension machine 5 sec on /off Rt leg x 12 in slot 3 (75-80 deg knee flexion approx) and 4 (65 deg knee flexion approx.)  TherActivity Leg press double leg 100 lbs 2 x 15 slow lowering, single leg Rt 37 lbs 2 x 15 c slow lowering Step up in // bars with Lt hand on bar 4 inch step x 15 WB on Rt leg  Neuro Re-ed Tandem ambulation in // bars fwd/back 10 ft x 5 each way with occasional to moderate HHA Forward/back church pew anterior/posterior ankle weight shift 1 min in // bars with occasional HHA  TODAY'S TREATMENT:                                                           DATE:   04/13/2023: Therapeutic Exercise: Nustep seat 15 level 8 with BLEs only for 10 min Leg press double leg 100 lbs 2 x 15 slow lowering, single leg Rt 37 lbs 2 x 15 c slow lowering Standing incline gastroc stretch bilateral 30 sec x 3  Seated Rt leg LAQ AROM x 5 Seated Rt leg quad set 5 sec hold x 5 Seated Rt leg quad set with SLR attempt just off floor 2 x 5 (cues for mixing in at home)   Neuro Re-ed Tandem stance 1 min x 2  bilateral with occasional HHA on bars.  Forward/back church pew anterior/posterior ankle weight shift 1 min in // bars with occasional HHA Time spent with visual demonstration and verbal cues for activity.     TODAY'S TREATMENT:                                                           DATE:  04/06/2023: Therapeutic Exercise: Nustep seat 15 level 8 with BLEs only for 8 min Seated Rt knee LAQ  to available range, then strap assisted end range then eccentric lowering3 x 10  Supine SLR X 10, no assist needed today Supine TKE stretch 3 min heel prop   TherActivity Leg press double leg 93 lbs 3 x 10 slow lowering, single leg Rt 37 lbs 3 x 10 Step up with bilateral hands on bar x 15 forward and X 15 lateral Rt leg WB 6 inch step   Vasopnuematic  X 10 min, medium compression, 34 deg to Rt knee in elevation  PATIENT EDUCATION: 03/09/2023 Education details: HEP, PT plan of care Person educated: Patient Education method: Explanation, Demonstration, Verbal cues, and Handouts Education comprehension: verbalized understanding and needs further education   HOME EXERCISE PROGRAM: Access Code:  [facility] URL: [email or web address] Date: 03/19/2023 Prepared by: [name] Faster  Exercises - Supine Quad Set  - 2 x daily - 6 x weekly - 2-3 sets - 10 reps - 5 sec hold - Supine Heel Slide with Strap  - 2 x daily - 6 x weekly - 2-3 sets - 10 reps - 5 hold - straight leg raise with help from strap  - 2 x daily - 6 x weekly - 1-2 sets - 10 reps - 10 seconds hold - Seated March  - 2 x daily - 6 x weekly - 2-3 sets - 10 reps - Seated Long Arc Quad  - 2 x daily - 6 x weekly - 2-3 sets - 10 reps - 3 hold - Long Sitting Quad Set with Towel Roll Under Heel  - 5 x daily - 7 x weekly - 2 sets - 10 reps - 5 seconds hold - Prone Knee Extension with Ankle Weight  - 2-3 x daily - 7 x weekly - 1 sets - 2-3 reps - 2 minutes hold - Prone Knee Flexion  - 2-3 x daily - 7 x weekly - 2-3 sets - 10 reps - 5  seconds hold - Prone Knee Extension Overpressure  - 2-3 x daily - 7 x weekly - 2-3 sets - 10 reps - 5 seconds hold   ASSESSMENT:  CLINICAL IMPRESSION:    Pt arrives today doing well, continued to cue him to use cane on contralateral side but he is not quite used to this yet. Continued progressive strengthening as per POC, doing well overall. We will plan on formal progress note next visit to better assess progress with PT and needs moving forward.   OBJECTIVE IMPAIRMENTS: decreased activity tolerance, difficulty walking, decreased balance, decreased endurance, decreased mobility, decreased ROM, decreased strength, impaired flexibility, impaired LE use, and pain.  ACTIVITY LIMITATIONS: bending, lifting, carry, locomotion, cleaning, community activity, driving, and  occupation  PERSONAL FACTORS: see above PMH are also affecting patient's functional outcome.  REHAB POTENTIAL: Good  CLINICAL DECISION MAKING: Stable/uncomplicated  EVALUATION COMPLEXITY: Low    GOALS: Short term PT Goals Target date: 04/06/2023   Pt will be I and compliant with HEP. Baseline:  Goal status: Ongoing 03/16/2023   Long term PT goals Target date:06/01/2023   Pt will improve Rt knee ROM to [facility] (3-115 deg) to improve functional mobility Baseline: Goal status: Ongoing 03/16/2023 Pt will improve Rt hip/knee strength to at least 4+/5 MMT to improve functional strength Baseline: Goal status: Ongoing 03/16/2023 Pt will improve FOTO to at least 71% functional to show improved function Baseline: Goal status: Ongoing 03/16/2023 Pt will reduce pain to overall less than 2-3/10 with usual activity and feel ready to return to work activity. Baseline: Goal status: Ongoing 03/16/2023 Pt will be able to ambulate community distances at least 1000 ft WNL gait pattern without AD Baseline: Goal status: Ongoing 03/16/2023  PLAN: PT FREQUENCY: 1-3 times per week   PT DURATION: 6-12 weeks  PLANNED INTERVENTIONS  (unless contraindicated): aquatic PT, Canalith repositioning, cryotherapy, Electrical stimulation, Iontophoresis with 4 mg/ml dexamethasome, Moist heat, traction, Ultrasound, gait training, Therapeutic exercise, balance training, neuromuscular re-education, patient/family education, prosthetic training, manual techniques, passive ROM, dry needling, taping, vasopnuematic device, vestibular, spinal manipulations, joint manipulations [id number]-Therapeutic exercises, 97530- Therapeutic activity, 97112- Neuromuscular re-education, 97535- Self Care, and [id number]- Manual therapy  PLAN FOR NEXT SESSION: Cane practice, progressive strengthening in various postioning. Gait training without cane as appropriate. Needs progress note/potential POC update next visit  Nedra Hai, PT, DPT 04/20/23 12:27 PM

## 2023-04-22 ENCOUNTER — Ambulatory Visit (INDEPENDENT_AMBULATORY_CARE_PROVIDER_SITE_OTHER): Payer: BC Managed Care – PPO | Admitting: Physical Therapy

## 2023-04-22 ENCOUNTER — Encounter: Payer: Self-pay | Admitting: Physical Therapy

## 2023-04-22 DIAGNOSIS — R2689 Other abnormalities of gait and mobility: Secondary | ICD-10-CM | POA: Diagnosis not present

## 2023-04-22 DIAGNOSIS — R600 Localized edema: Secondary | ICD-10-CM

## 2023-04-22 DIAGNOSIS — M6281 Muscle weakness (generalized): Secondary | ICD-10-CM

## 2023-04-22 DIAGNOSIS — M25662 Stiffness of left knee, not elsewhere classified: Secondary | ICD-10-CM | POA: Diagnosis not present

## 2023-04-22 NOTE — Therapy (Signed)
OUTPATIENT PHYSICAL THERAPY TREATMENT/PROGRESS NOTE AND RECERT   Patient Name: Johnny [name] MRN: [id number] DOB:Aug 20, 1955, 67 y.o., male Today's Date: 04/22/2023  END OF SESSION:  PT End of Session - 04/22/23 1146     Visit Number 14    Number of Visits 30    Date for PT Re-Evaluation 06/17/23    Authorization Type [employer] 20% coinsurance    Authorization Time Period recert 04/22/23 to 06/17/23    PT Start Time 1147    PT Stop Time 1226    PT Time Calculation (min) 39 min    Activity Tolerance Patient tolerated treatment well    Behavior During Therapy [facility] for tasks assessed/performed                      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 ENDOSCOPY;  Service: Cardiovascular;  Laterality: N/A;   IRRIGATION AND DEBRIDEMENT KNEE Right 02/18/2023   Procedure: RIGHT KNEE ARTHROSCOPIC IRRIGATION AND DEBRIDEMENT;  Surgeon: Tarry Kos, MD;  Location: [facility] ORS;  Service: Orthopedics;  Laterality: Right;   TEE WITHOUT CARDIOVERSION N/A 07/18/2019   Procedure: TRANSESOPHAGEAL  ECHOCARDIOGRAM (TEE);  Surgeon: Lars Masson, MD;  Location: [facility] ENDOSCOPY;  Service: Cardiovascular;  Laterality: N/A;   TOOTH EXTRACTION     XI ROBOTIC ASSISTED SIMPLE PROSTATECTOMY N/A 05/31/2020   Procedure: XI ROBOTIC ASSISTED SIMPLE PROSTATECTOMY-SUPRAPUBIC;  Surgeon: Malen Gauze, MD;  Location: [facility] ORS;  Service: Urology;  Laterality: N/A;   Patient Active Problem List   Diagnosis Date Noted   Rheumatoid factor positive 02/10/2023   Knee pain, right 01/21/2023   Hyperlipidemia 01/20/2023   Effusion, right knee 01/20/2023   Hypercoagulable state due to typical atrial flutter (HCC) 01/13/2023   Venous stasis of lower extremity 01/09/2023   BPH with obstruction/lower urinary tract symptoms 05/31/2020   Paroxysmal atrial flutter (HCC)    Adrenal nodule (HCC) 03/28/2020   OSA (obstructive sleep apnea) 03/28/2020   Morbid obesity (HCC) 03/28/2020   Medication noncompliance due to cognitive impairment 03/28/2020   Chronic systolic CHF (congestive heart failure) (HCC) 03/28/2020   CKD (chronic kidney disease) stage 4, GFR 15-29 ml/min (HCC) 06/10/2017   PSORIASIS, 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   Type 2 diabetes with complication (HCC) 08/03/2007   Hyperlipidemia associated with type 2 diabetes mellitus (HCC) 08/03/2007   Essential hypertension 08/03/2007    PCP: Myrlene Broker, MD  REFERRING PROVIDER: Tarry Kos, MD   REFERRING DIAG: [phone] (ICD-10-CM) - Effusion,  right knee   THERAPY DIAG:  Muscle weakness (generalized) - Plan: PT plan of care cert/re-cert  Stiffness of left knee, not elsewhere classified - Plan: PT plan of care cert/re-cert  Other abnormalities of gait and mobility - Plan: PT plan of care cert/re-cert  Localized edema - Plan: PT plan of care cert/re-cert  Rationale for Evaluation and Treatment: Rehabilitation  ONSET DATE: Right knee scope, irrigation and debriement, major synovectomy 02/18/2023  SUBJECTIVE:   SUBJECTIVE  STATEMENT:  Feeling good, when I'm walking a long distance my side calf muscles get really tight, would like to continue PT up until I see Dr. Roda Shutters again in February. I try to walk around the house as much possible without the cane. When I sit down I'm fine until I get up, it makes a lot of joint noises until I get moving more no pain though.   PERTINENT HISTORY: Right knee scope 02/18/2023 PMH:RA,OSA,Obesity,CHF,CKD,DM2 To wear knee immobilizer until quad function improves  PAIN:  NPRS scale:   0/10 now, over the past week up to 5/10 when laying with leg straight and in externally rotated position Pain location:  Pain description:  Aggravating factors:  Relieving factors:    PRECAUTIONS: None  RED FLAGS: None   WEIGHT BEARING RESTRICTIONS: No  FALLS:  Has patient fallen in last 6 months? 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  DIAGNOSTIC FINDINGS:  03/09/2023 review:  XR 01/05/23 IMPRESSION: Mild osteoarthritis with moderate knee joint effusion.  PATIENT SURVEYS:  03/09/2023 FOTO 57% functional goal is 71%; 04/22/23- 55%   COGNITION: 03/09/2023 Overall cognitive status: Within functional limits for tasks assessed      EDEMA:  03/09/2023 Moderate edema noted in Rt knee and Lower leg   LOWER EXTREMITY ROM:   AROM/PROM Right eval Right 03/13/2023 Right 03/19/23 Right 04/22/23  Hip flexion      Hip extension      Hip abduction      Hip adduction      Hip internal rotation      Hip external rotation      Knee flexion 60/80 Passive 97  Supine AROM 107*  Knee extension 15/10 Passive lacks 10 Supine P: -8* A: quad set -10* Supine AROM -9*  Ankle dorsiflexion      Ankle plantarflexion      Ankle  inversion      Ankle eversion       (Blank rows = not tested)  LOWER EXTREMITY MMT:  MMT Right eval Left 04/06/23 Right 04/13/2023 Left 04/13/2023 Right 04/22/23 Left 04/22/23  Hip flexion 2       Hip extension        Hip abduction        Hip adduction        Hip internal rotation        Hip external rotation        Knee flexion 3 4   4 5   Knee extension 2 3 13.3, 12.7 lbs 5/5 93.6 lbs 3- 5  Ankle dorsiflexion        Ankle plantarflexion        Ankle inversion        Ankle eversion         (Blank rows = not  tested)  GAIT: 04/16/2023: SPC use in Rt UE per Pt preference despite encouragement for use in Lt arm.   04/01/2023:   FWW or crutches reported.   FWW into clinc.   03/09/2023 Comments: Currently using RW and in knee immobilizer druing ambulation. He did not use AD PLOF                   TODAY'S TREATMENT:                                                           DATE:     04/22/23   FOTO, objective measures as above, goal review and education on progress/POC    TherEx  Nustep L8 x12 minutes BLEs only Shuttle LE press 112# 2x15 double leg Shuttle LE press 43# 2x15 single leg      04/20/23  TherEx  Nustep L8 x12 minutes BLEs only  Shuttle LE press double leg: 100# 2x15 power up/slow lower  Shuttle LE press single leg 43# 2x10 power up/slow lower    NMR  Tandem gait forward/backward in // bars x4 laps 3 way taps blue foam pad in // bars 2x5 B SLS with one foot on 6 inch box 2x30 seconds B blue foam pad     04/16/2023: Therapeutic Exercise: Nustep seat 15 level 8 with BLEs only for 12 min Seated isometric on knee extension machine 5 sec on /off Rt leg x 12 in slot 3 (75-80 deg knee flexion approx) and 4 (65 deg knee flexion approx.)  TherActivity Leg press double leg 100 lbs 2 x 15 slow lowering, single leg Rt 37 lbs 2 x 15 c slow lowering Step up in // bars with Lt hand on bar 4 inch step x 15 WB on Rt leg  Neuro Re-ed Tandem ambulation  in // bars fwd/back 10 ft x 5 each way with occasional to moderate HHA Forward/back church pew anterior/posterior ankle weight shift 1 min in // bars with occasional HHA  TODAY'S TREATMENT:                                                           DATE:   04/13/2023: Therapeutic Exercise: Nustep seat 15 level 8 with BLEs only for 10 min Leg press double leg 100 lbs 2 x 15 slow lowering, single leg Rt 37 lbs 2 x 15 c slow lowering Standing incline gastroc stretch bilateral 30 sec x 3  Seated Rt leg LAQ AROM x 5 Seated Rt leg quad set 5 sec hold x 5 Seated Rt leg quad set with SLR attempt just off floor 2 x 5 (cues for mixing in at home)   Neuro Re-ed Tandem stance 1 min x 2 bilateral with occasional HHA on bars.  Forward/back church pew anterior/posterior ankle weight shift 1 min in // bars with occasional HHA Time spent with visual demonstration and verbal cues for activity.     TODAY'S TREATMENT:  DATE:  04/06/2023: Therapeutic Exercise: Nustep seat 15 level 8 with BLEs only for 8 min Seated Rt knee LAQ  to available range, then strap assisted end range then eccentric lowering3 x 10  Supine SLR X 10, no assist needed today Supine TKE stretch 3 min heel prop   TherActivity Leg press double leg 93 lbs 3 x 10 slow lowering, single leg Rt 37 lbs 3 x 10 Step up with bilateral hands on bar x 15 forward and X 15 lateral Rt leg WB 6 inch step   Vasopnuematic  X 10 min, medium compression, 34 deg to Rt knee in elevation  PATIENT EDUCATION: 03/09/2023 Education details: HEP, PT plan of care Person educated: Patient Education method: Explanation, Demonstration, Verbal cues, and Handouts Education comprehension: verbalized understanding and needs further education   HOME EXERCISE PROGRAM: Access Code: [facility] URL: [email or web address] Date: 03/19/2023 Prepared by: [name] Faster  Exercises - Supine Quad Set  -  2 x daily - 6 x weekly - 2-3 sets - 10 reps - 5 sec hold - Supine Heel Slide with Strap  - 2 x daily - 6 x weekly - 2-3 sets - 10 reps - 5 hold - straight leg raise with help from strap  - 2 x daily - 6 x weekly - 1-2 sets - 10 reps - 10 seconds hold - Seated March  - 2 x daily - 6 x weekly - 2-3 sets - 10 reps - Seated Long Arc Quad  - 2 x daily - 6 x weekly - 2-3 sets - 10 reps - 3 hold - Long Sitting Quad Set with Towel Roll Under Heel  - 5 x daily - 7 x weekly - 2 sets - 10 reps - 5 seconds hold - Prone Knee Extension with Ankle Weight  - 2-3 x daily - 7 x weekly - 1 sets - 2-3 reps - 2 minutes hold - Prone Knee Flexion  - 2-3 x daily - 7 x weekly - 2-3 sets - 10 reps - 5 seconds hold - Prone Knee Extension Overpressure  - 2-3 x daily - 7 x weekly - 2-3 sets - 10 reps - 5 seconds hold   ASSESSMENT:  CLINICAL IMPRESSION:    Pt arrives today doing well, we focused on getting updated objective measures/progress note today. FOTO is about the same but he really is showing good objective progress thus far and remains very motivated to improve with skilled PT services. Extended POC as appropriate, anticipate he will continue to do very well with skilled PT services moving forward.   OBJECTIVE IMPAIRMENTS: decreased activity tolerance, difficulty walking, decreased balance, decreased endurance, decreased mobility, decreased ROM, decreased strength, impaired flexibility, impaired LE use, and pain.  ACTIVITY LIMITATIONS: bending, lifting, carry, locomotion, cleaning, community activity, driving, and  occupation  PERSONAL FACTORS: see above PMH are also affecting patient's functional outcome.  REHAB POTENTIAL: Good  CLINICAL DECISION MAKING: Stable/uncomplicated  EVALUATION COMPLEXITY: Low    GOALS: Short term PT Goals Target date: 05/20/2023     Pt will be I and compliant with HEP. Baseline:  Goal status: MET 04/22/23   Long term PT goals 06/17/2023     Pt will improve Rt knee  ROM to [facility] (3-115 deg) to improve functional mobility Baseline: Goal status: ONGOING 04/22/23  Pt will improve Rt hip/knee strength to at least 4+/5 MMT to improve functional strength Baseline: Goal status: ONGOING 04/22/23  Pt will improve FOTO to at least 71% functional to show improved function  Baseline: Goal status: ONGOING 04/22/23  Pt will reduce pain to overall less than 2-3/10 with usual activity and feel ready to return to work activity. Baseline: Goal status: PARTIALLY MET 04/22/23- has met goal with exception of when LE is in externally rotated position at night and when exerting on LE press   Pt will be able to ambulate community distances at least 1000 ft WNL gait pattern without AD Baseline: Goal status: ONGOING 04/22/23  PLAN: PT FREQUENCY: 2x/week   PT DURATION: 8 weeks   PLANNED INTERVENTIONS (unless contraindicated): aquatic PT, Canalith repositioning, cryotherapy, Electrical stimulation, Iontophoresis with 4 mg/ml dexamethasome, Moist heat, traction, Ultrasound, gait training, Therapeutic exercise, balance training, neuromuscular re-education, patient/family education, prosthetic training, manual techniques, passive ROM, dry needling, taping, vasopnuematic device, vestibular, spinal manipulations, joint manipulations 97110-Therapeutic exercises, 97530- Therapeutic activity, [id number]- Neuromuscular re-education, 97535- Self Care, and 28413- Manual therapy  PLAN FOR NEXT SESSION: Cane practice, progressive strengthening in various postioning. Gait training without cane as appropriate. Has cardiac ablation for Afib 04/30/23, will have to be out of PT/exercise for 5 days after the procedure   Nedra Hai, PT, DPT 04/22/23 12:28 PM

## 2023-04-27 NOTE — Pre-Procedure Instructions (Signed)
Instructed patient on the following items: Arrival time 0515 Nothing to eat or drink after midnight No meds AM of procedure Responsible person to drive you home and stay with you for 24 hrs  Have you missed any doses of anti-coagulant Eliquis- takes twice a day, hasn't missed any doses.  Don't take dose the am of procedure.  Pt took his Mounjaro yesterday.  Spoke with Dr Charlynn Grimes- case will not be canceled.

## 2023-04-29 NOTE — Anesthesia Preprocedure Evaluation (Signed)
Anesthesia Evaluation  Patient identified by MRN, date of birth, ID band Patient awake    Reviewed: Allergy & Precautions, H&P , NPO status , Patient's Chart, lab work & pertinent test results  Airway Mallampati: III  TM Distance: >3 FB Neck ROM: Full    Dental no notable dental hx. (+) Teeth Intact, Dental Advisory Given   Pulmonary sleep apnea    Pulmonary exam normal breath sounds clear to auscultation       Cardiovascular Exercise Tolerance: Good hypertension, Pt. on medications and Pt. on home beta blockers +CHF  + dysrhythmias Atrial Fibrillation  Rhythm:Regular Rate:Normal     Neuro/Psych negative neurological ROS  negative psych ROS   GI/Hepatic negative GI ROS, Neg liver ROS,,,  Endo/Other  diabetes    Renal/GU Renal InsufficiencyRenal disease  negative genitourinary   Musculoskeletal  (+) Arthritis , Osteoarthritis,    Abdominal   Peds  Hematology negative hematology ROS (+)   Anesthesia Other Findings   Reproductive/Obstetrics negative OB ROS                             Anesthesia Physical Anesthesia Plan  ASA: 3  Anesthesia Plan: General   Post-op Pain Management: Tylenol PO (pre-op)*   Induction: Intravenous  PONV Risk Score and Plan: 3 and Ondansetron, Dexamethasone and Midazolam  Airway Management Planned: Oral ETT  Additional Equipment:   Intra-op Plan:   Post-operative Plan: Extubation in OR  Informed Consent: I have reviewed the patients History and Physical, chart, labs and discussed the procedure including the risks, benefits and alternatives for the proposed anesthesia with the patient or authorized representative who has indicated his/her understanding and acceptance.     Dental advisory given  Plan Discussed with: CRNA  Anesthesia Plan Comments:        Anesthesia Quick Evaluation

## 2023-04-30 ENCOUNTER — Ambulatory Visit (HOSPITAL_COMMUNITY): Payer: Self-pay | Admitting: Anesthesiology

## 2023-04-30 ENCOUNTER — Other Ambulatory Visit: Payer: Self-pay

## 2023-04-30 ENCOUNTER — Ambulatory Visit (HOSPITAL_COMMUNITY)
Admission: RE | Disposition: A | Discharge status: Home / Self Care | Payer: BC Managed Care – PPO | Source: Home / Self Care | Attending: Cardiology

## 2023-04-30 ENCOUNTER — Ambulatory Visit (HOSPITAL_COMMUNITY)
Admission: RE | Admit: 2023-04-30 | Discharge: 2023-04-30 | Disposition: A | Discharge status: Home / Self Care | Payer: BC Managed Care – PPO | Attending: Cardiology | Admitting: Cardiology

## 2023-04-30 DIAGNOSIS — N184 Chronic kidney disease, stage 4 (severe): Secondary | ICD-10-CM | POA: Diagnosis not present

## 2023-04-30 DIAGNOSIS — Z7901 Long term (current) use of anticoagulants: Secondary | ICD-10-CM | POA: Insufficient documentation

## 2023-04-30 DIAGNOSIS — G4733 Obstructive sleep apnea (adult) (pediatric): Secondary | ICD-10-CM | POA: Insufficient documentation

## 2023-04-30 DIAGNOSIS — I509 Heart failure, unspecified: Secondary | ICD-10-CM | POA: Insufficient documentation

## 2023-04-30 DIAGNOSIS — I429 Cardiomyopathy, unspecified: Secondary | ICD-10-CM | POA: Insufficient documentation

## 2023-04-30 DIAGNOSIS — E1122 Type 2 diabetes mellitus with diabetic chronic kidney disease: Secondary | ICD-10-CM | POA: Insufficient documentation

## 2023-04-30 DIAGNOSIS — I700 Atherosclerosis of aorta: Secondary | ICD-10-CM | POA: Diagnosis not present

## 2023-04-30 DIAGNOSIS — Z79899 Other long term (current) drug therapy: Secondary | ICD-10-CM | POA: Diagnosis not present

## 2023-04-30 DIAGNOSIS — I13 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 1 through stage 4 chronic kidney disease, or unspecified chronic kidney disease: Secondary | ICD-10-CM | POA: Diagnosis not present

## 2023-04-30 DIAGNOSIS — D6869 Other thrombophilia: Secondary | ICD-10-CM | POA: Insufficient documentation

## 2023-04-30 DIAGNOSIS — I483 Typical atrial flutter: Secondary | ICD-10-CM | POA: Insufficient documentation

## 2023-04-30 HISTORY — PX: LOOP RECORDER INSERTION: EP1214

## 2023-04-30 HISTORY — PX: A-FLUTTER ABLATION: EP1230

## 2023-04-30 LAB — GLUCOSE, CAPILLARY
Glucose-Capillary: 117 mg/dL — ABNORMAL HIGH (ref 70–99)
Glucose-Capillary: 96 mg/dL (ref 70–99)

## 2023-04-30 SURGERY — A-FLUTTER ABLATION
Anesthesia: General

## 2023-04-30 MED ORDER — FENTANYL CITRATE (PF) 100 MCG/2ML IJ SOLN
INTRAMUSCULAR | Status: AC
  Filled 2023-04-30: qty 2

## 2023-04-30 MED ORDER — ONDANSETRON HCL 4 MG/2ML IJ SOLN
INTRAMUSCULAR | Status: DC | PRN
  Administered 2023-04-30: 4 mg via INTRAVENOUS

## 2023-04-30 MED ORDER — HEPARIN SODIUM (PORCINE) 1000 UNIT/ML IJ SOLN
INTRAMUSCULAR | Status: AC
Start: 2023-04-30 — End: ?
  Filled 2023-04-30: qty 10

## 2023-04-30 MED ORDER — ACETAMINOPHEN 500 MG PO TABS
1000.0000 mg | ORAL_TABLET | Freq: Once | ORAL | Status: AC
  Administered 2023-04-30: 1000 mg via ORAL
  Filled 2023-04-30: qty 2

## 2023-04-30 MED ORDER — LIDOCAINE-EPINEPHRINE 1 %-1:100000 IJ SOLN
INTRAMUSCULAR | Status: DC | PRN
  Administered 2023-04-30: 30 mL via INTRADERMAL

## 2023-04-30 MED ORDER — BUPIVACAINE HCL (PF) 0.25 % IJ SOLN
INTRAMUSCULAR | Status: AC
Start: 2023-04-30 — End: ?
  Filled 2023-04-30: qty 30

## 2023-04-30 MED ORDER — SODIUM CHLORIDE 0.9% FLUSH: 3.0000 mL | Freq: Two times a day (BID) | INTRAVENOUS | Status: DC

## 2023-04-30 MED ORDER — PROPOFOL 10 MG/ML IV BOLUS
INTRAVENOUS | Status: DC | PRN
  Administered 2023-04-30: 100 mg via INTRAVENOUS
  Administered 2023-04-30: 200 mg via INTRAVENOUS

## 2023-04-30 MED ORDER — ACETAMINOPHEN 325 MG PO TABS: 650.0000 mg | ORAL_TABLET | ORAL | Status: DC | PRN

## 2023-04-30 MED ORDER — SODIUM CHLORIDE 0.9 % IV SOLN: 250.0000 mL | INTRAVENOUS | Status: DC | PRN

## 2023-04-30 MED ORDER — CEFAZOLIN SODIUM-DEXTROSE 2-4 GM/100ML-% IV SOLN
INTRAVENOUS | Status: AC
  Filled 2023-04-30: qty 100

## 2023-04-30 MED ORDER — ONDANSETRON HCL 4 MG/2ML IJ SOLN
4.0000 mg | Freq: Four times a day (QID) | INTRAMUSCULAR | Status: DC | PRN
Start: 2023-04-30 — End: 2023-04-30

## 2023-04-30 MED ORDER — PHENYLEPHRINE HCL-NACL 20-0.9 MG/250ML-% IV SOLN
INTRAVENOUS | Status: DC | PRN
  Administered 2023-04-30: 25 ug/min via INTRAVENOUS

## 2023-04-30 MED ORDER — LIDOCAINE-EPINEPHRINE 1 %-1:100000 IJ SOLN
INTRAMUSCULAR | Status: AC
  Filled 2023-04-30: qty 1

## 2023-04-30 MED ORDER — ROCURONIUM BROMIDE 10 MG/ML (PF) SYRINGE
PREFILLED_SYRINGE | INTRAVENOUS | Status: DC | PRN
  Administered 2023-04-30: 80 mg via INTRAVENOUS
  Administered 2023-04-30: 20 mg via INTRAVENOUS

## 2023-04-30 MED ORDER — DEXAMETHASONE SODIUM PHOSPHATE 10 MG/ML IJ SOLN
INTRAMUSCULAR | Status: DC | PRN
  Administered 2023-04-30: 8 mg via INTRAVENOUS

## 2023-04-30 MED ORDER — MIDAZOLAM HCL 2 MG/2ML IJ SOLN
INTRAMUSCULAR | Status: DC | PRN
  Administered 2023-04-30: 2 mg via INTRAVENOUS

## 2023-04-30 MED ORDER — SUCCINYLCHOLINE CHLORIDE 200 MG/10ML IV SOSY
PREFILLED_SYRINGE | INTRAVENOUS | Status: DC | PRN
  Administered 2023-04-30: 200 mg via INTRAVENOUS

## 2023-04-30 MED ORDER — APIXABAN 5 MG PO TABS: 5.0000 mg | ORAL_TABLET | Freq: Once | ORAL | Status: DC

## 2023-04-30 MED ORDER — SODIUM CHLORIDE 0.9 % IV SOLN: INTRAVENOUS | Status: DC

## 2023-04-30 MED ORDER — SUGAMMADEX SODIUM 200 MG/2ML IV SOLN
INTRAVENOUS | Status: DC | PRN
  Administered 2023-04-30: 200 mg via INTRAVENOUS

## 2023-04-30 MED ORDER — FENTANYL CITRATE (PF) 250 MCG/5ML IJ SOLN
INTRAMUSCULAR | Status: DC | PRN
  Administered 2023-04-30: 100 ug via INTRAVENOUS

## 2023-04-30 MED ORDER — CEFAZOLIN SODIUM-DEXTROSE 2-3 GM-%(50ML) IV SOLR
INTRAVENOUS | Status: DC | PRN
  Administered 2023-04-30: 2 g via INTRAVENOUS

## 2023-04-30 MED ORDER — MIDAZOLAM HCL 5 MG/5ML IJ SOLN
INTRAMUSCULAR | Status: AC
  Filled 2023-04-30: qty 5

## 2023-04-30 MED ORDER — HEPARIN (PORCINE) IN NACL 1000-0.9 UT/500ML-% IV SOLN
INTRAVENOUS | Status: DC | PRN
  Administered 2023-04-30 (×2): 500 mL

## 2023-04-30 MED ORDER — SODIUM CHLORIDE 0.9% FLUSH: 3.0000 mL | INTRAVENOUS | Status: DC | PRN

## 2023-04-30 MED ORDER — PHENYLEPHRINE 80 MCG/ML (10ML) SYRINGE FOR IV PUSH (FOR BLOOD PRESSURE SUPPORT)
PREFILLED_SYRINGE | INTRAVENOUS | Status: DC | PRN
  Administered 2023-04-30 (×2): 80 ug via INTRAVENOUS
  Administered 2023-04-30: 160 ug via INTRAVENOUS
  Administered 2023-04-30: 80 ug via INTRAVENOUS

## 2023-04-30 SURGICAL SUPPLY — 16 items
CATH SMTCH THERMOCOOL SF DF (CATHETERS) IMPLANT
CATH SOUNDSTAR ECO 8FR (CATHETERS) IMPLANT
CATH WEBSTER BI DIR CS D-F CRV (CATHETERS) IMPLANT
CLOSURE PERCLOSE PROSTYLE (VASCULAR PRODUCTS) IMPLANT
COVER DOME SNAP 22 D (MISCELLANEOUS) IMPLANT
DEVICE CLOSURE MYNXGRIP 6/7F (Vascular Products) IMPLANT
MAT PREVALON FULL STRYKER (MISCELLANEOUS) IMPLANT
MONITOR LUX-DX II+ M312 (Prosthesis & Implant Heart) IMPLANT
PACK EP LF (CUSTOM PROCEDURE TRAY) ×1 IMPLANT
PACK LOOP INSERTION (CUSTOM PROCEDURE TRAY) ×1 IMPLANT
PAD DEFIB RADIO PHYSIO CONN (PAD) ×1 IMPLANT
PATCH CARTO3 (PAD) IMPLANT
SHEATH CARTO VIZIGO MED CURVE (SHEATH) IMPLANT
SHEATH PINNACLE 8F 10CM (SHEATH) IMPLANT
SHEATH PINNACLE 9F 10CM (SHEATH) IMPLANT
TUBING SMART ABLATE COOLFLOW (TUBING) IMPLANT

## 2023-04-30 NOTE — Anesthesia Postprocedure Evaluation (Signed)
Anesthesia Post Note  Patient: Grace Blight Rau  Procedure(s) Performed: A-FLUTTER ABLATION LOOP RECORDER INSERTION     Patient location during evaluation: Cath Lab Anesthesia Type: General Level of consciousness: awake and alert Pain management: pain level controlled Vital Signs Assessment: post-procedure vital signs reviewed and stable Respiratory status: spontaneous breathing, nonlabored ventilation and respiratory function stable Cardiovascular status: blood pressure returned to baseline and stable Postop Assessment: no apparent nausea or vomiting Anesthetic complications: yes  Encounter Notable Events  Notable Event Outcome Phase Comment  Difficult to intubate - unexpected  Intraprocedure Filed from anesthesia note documentation.    Last Vitals:  Vitals:   04/30/23 1030 04/30/23 1040  BP: (!) 163/92 (!) 154/91  Pulse: 67 68  Resp: 17 18  Temp:  36.6 C  SpO2: 94% 97%    Last Pain:  Vitals:   04/30/23 1050  TempSrc:   PainSc: 0-No pain                 Cameren Odwyer,W. EDMOND

## 2023-04-30 NOTE — Discharge Instructions (Addendum)
Cardiac Ablation, Care After  - STOP AMIODARONE   This sheet gives you information about how to care for yourself after your procedure. Your health care provider may also give you more specific instructions. If you have problems or questions, contact your health care provider. What can I expect after the procedure? After the procedure, it is common to have: Bruising around your puncture site. Tenderness around your puncture site. Skipped heartbeats. If you had an atrial fibrillation ablation, you may have atrial fibrillation during the first several months after your procedure.  Tiredness (fatigue).  Follow these instructions at home: Puncture site care  Follow instructions from your health care provider about how to take care of your puncture site. Make sure you: If present, leave stitches (sutures), skin glue, or adhesive strips in place. These skin closures may need to stay in place for up to 2 weeks. If adhesive strip edges start to loosen and curl up, you may trim the loose edges. Do not remove adhesive strips completely unless your health care provider tells you to do that. If a large square bandage is present, this may be removed 24 hours after surgery.  Check your puncture site every day for signs of infection. Check for: Redness, swelling, or pain. Fluid or blood. If your puncture site starts to bleed, lie down on your back, apply firm pressure to the area, and contact your health care provider. Warmth. Pus or a bad smell. A pea or small marble sized lump at the site is normal and can take up to three months to resolve.  Driving Do not drive for at least 4 days after your procedure or however long your health care provider recommends. (Do not resume driving if you have previously been instructed not to drive for other health reasons.) Do not drive or use heavy machinery while taking prescription pain medicine. Activity Avoid activities that take a lot of effort for at least 7 days  after your procedure. Do not lift anything that is heavier than 5 lb (4.5 kg) for one week.  No sexual activity for 1 week.  Return to your normal activities as told by your health care provider. Ask your health care provider what activities are safe for you. General instructions Take over-the-counter and prescription medicines only as told by your health care provider. Do not use any products that contain nicotine or tobacco, such as cigarettes and e-cigarettes. If you need help quitting, ask your health care provider. You may shower after 24 hours, but Do not take baths, swim, or use a hot tub for 1 week.  Do not drink alcohol for 24 hours after your procedure. Keep all follow-up visits as told by your health care provider. This is important. Contact a health care provider if: You have redness, mild swelling, or pain around your puncture site. You have fluid or blood coming from your puncture site that stops after applying firm pressure to the area. Your puncture site feels warm to the touch. You have pus or a bad smell coming from your puncture site. You have a fever. You have chest pain or discomfort that spreads to your neck, jaw, or arm. You have chest pain that is worse with lying on your back or taking a deep breath. You are sweating a lot. You feel nauseous. You have a fast or irregular heartbeat. You have shortness of breath. You are dizzy or light-headed and feel the need to lie down. You have pain or numbness in the arm or  leg closest to your puncture site. Get help right away if: Your puncture site suddenly swells. Your puncture site is bleeding and the bleeding does not stop after applying firm pressure to the area. These symptoms may represent a serious problem that is an emergency. Do not wait to see if the symptoms will go away. Get medical help right away. Call your local emergency services (911 in the [place] Do not drive yourself to the hospital. Summary After the  procedure, it is normal to have bruising and tenderness at the puncture site in your groin, neck, or forearm. Check your puncture site every day for signs of infection. Get help right away if your puncture site is bleeding and the bleeding does not stop after applying firm pressure to the area. This is a medical emergency. This information is not intended to replace advice given to you by your health care provider. Make sure you discuss any questions you have with your health care provider.   Care After Your Loop Recorder  You have a Dietitian your cardiac device site for redness, swelling, and drainage. Call the device clinic at [phone] if you experience these symptoms or fever/chills.  If you notice bleeding from your site, hold firm, but gently pressure with two fingers for 5 minutes. Dried blood on the steri-strips when removing the outer bandage is normal.   Keep the large square bandage on your site for 24 hours and then you may remove it yourself. Keep the steri-strips underneath in place.   You may shower after 72 hours / 3 days from your procedure with the steri-strips in place. They will usually fall off on their own, or may be removed after 10 days. Pat dry.   Avoid lotions, ointments, or perfumes over your incision until it is well-healed.  Please do not submerge in water until your site is completely healed.   Your device is MRI compatible.   Remote monitoring is used to monitor your cardiac device from home. This monitoring is scheduled every month by our office. It allows [place] to keep an eye on the function of your device to ensure it is working properly.

## 2023-04-30 NOTE — H&P (Signed)
Electrophysiology Note:   Date:  12/26//2024  ID:  Johnny [name], DOB 1955/09/28, MRN [id number]   Primary Cardiologist: Armanda Magic, MD Electrophysiologist: Nobie Putnam, MD       History of Present Illness:   Johnny [name] is a 67 y.o. male with h/o CKD stage 4, DM, HTN, psoriasis, aortic atherosclerosis, OSA and arrhythmia induced cardiomyopathy who is seen today for Electrophysiology evaluation of atrial flutter at the request of Dr. Mayford Knife.    Patient was diagnosed with atrial flutter initially in 2021.  At that time he was admitted and underwent TEE guided cardioversion.  While in atrial flutter, echocardiogram showed an LVEF of 35 to 40%.  Repeat echo after cardioversion while in normal sinus rhythm did show recovery of his LVEF to 60-65%.  Patient recently presented to the emergency room on 01/05/2023 for right knee pain and swelling and was found to be in atrial flutter during that admission.  He again underwent successful cardioversion to normal sinus rhythm.  Patient reports being asymptomatic during both episodes; the first presenting to the ED with flulike symptoms in the second with severe right knee pain.     Since last clinic visit, he underwent surgery on his right knee. He thinks he is recovering from this well. He has remained in normal rhythm throughout. He denies palpitations, chest pain, shortness of breath, dizziness, and lightheadedness.  Interval: Patient presents today for scheduled catheter ablation. Reports feeling relatively well. Knee pain/function improved significantly. Wore Zio monitor with no AF. No new or acute complaints today.    Review of systems complete and found to be negative unless listed in HPI.    EP Information / Studies Reviewed:   EKG Interpretation Date/Time:                  Tuesday March 17 2023 14:05:26 EST Ventricular Rate:         77 PR Interval:                 142 QRS Duration:             80 QT Interval:                  400 QTC Calculation:452 R Axis:                         56   Text Interpretation:Normal sinus rhythm Left atrial enlargement Nonspecific T wave abnormality When compared with ECG of 20-Jan-2023 09:46,no significant change. Confirmed by Nobie Putnam [phone]) on 03/17/2023 6:55:31 PM    EKG 03/27/20:     EKG 01/06/23:     Echocardiogram 08/19/2019: Normal LV size and function.  LVEF 60 to 65%.  Mild concentric LVH. Normal RV function.  Mildly enlarged RV with normal pulmonary artery systolic pressures. Moderately dilated left and right atrial size. No significant valvular heart disease mentioned.   TEE 07/18/19: -Performed in the setting of atrial flutter. Severely reduced LV systolic function, LVEF 25 to 95%.  Global hypokinesis with no wall motion abnormalities.   Risk Assessment/Calculations:     CHA2DS2-VASc Score = 4   This indicates a 4.8% annual risk of stroke. The patient's score is based upon: CHF History: 1 (previous TCM) HTN History: 1 Diabetes History: 1 Stroke History: 0 Vascular Disease History: 1 (aortic atherosclerosis) Age Score: 0 Gender Score: 0         Physical Exam:    Today's Vitals  04/30/23 0538 04/30/23 0544  BP:  (!) 172/90  Pulse: 65   Resp: 18   Temp: 98.9 F (37.2 C)   TempSrc: Oral   SpO2: 100%   Weight: 124.7 kg   Height: 6\' 5"  (1.956 m)   PainSc: 0-No pain    Body mass index is 32.61 kg/m.   GEN: Well nourished, well developed in no acute distress NECK: No JVD; No carotid bruits CARDIAC: Normal rate and regular rhythm. RESPIRATORY:  Clear to auscultation without rales, wheezing or rhonchi  ABDOMEN: Soft, non-tender, non-distended EXTREMITIES: Bilateral edema present; No deformity    ASSESSMENT AND PLAN:   Johnny [name] is a 67 y.o. male with h/o CKD stage 4, DM, HTN, psoriasis, aortic atherosclerosis, OSA and arrhythmia induced cardiomyopathy who is seen today for Electrophysiology evaluation of atrial flutter at the request  of Dr. Mayford Knife.  Patient has had multiple episodes of typical atrial flutter.  Importantly, during 1 of these episodes echocardiogram was done and showed depressed LV function which did improve post cardioversion, consistent with an arrhythmia induced cardiomyopathy.  Given this, I think it is important that we prevent further episodes with a rhythm control strategy even though he has minimal symptoms. He is recovering from right knee surgery.    #. Typical atrial flutter: #.  Arrhythmia induced cardiomyopathy: -His CKD stage IV and other comorbidities limit antiarrhythmic options.  We will continue to use oral amiodarone in the short-term until ablation. Plan to stop today. -Patient recently completed Zio monitor to assess for Afib. No Afib. -Discussed treatment options today for AFL. Discussed risks, recovery and likelihood of success with each treatment strategy. Risk, benefits, and alternatives to EP study and ablation for afib were discussed. These risks include but are not limited to stroke, bleeding, vascular damage, tamponade, perforation, damage to the esophagus, lungs, phrenic nerve and other structures, pulmonary vein stenosis, worsening renal function, coronary vasospasm and death.  Discussed potential need for repeat ablation procedures and antiarrhythmic drugs after an initial ablation. The patient understands these risk and wishes to proceed with ablation.    #.  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 -Continue Eliquis.  Tolerating without issue.  Nobie Putnam, MD

## 2023-04-30 NOTE — Anesthesia Procedure Notes (Addendum)
Procedure Name: Intubation Date/Time: 04/30/2023 7:56 AM  Performed by: Hilda Lias, CRNAPre-anesthesia Checklist: Patient identified, Emergency Drugs available, Suction available and Patient being monitored Patient Re-evaluated:Patient Re-evaluated prior to induction Oxygen Delivery Method: Circle System Utilized Preoxygenation: Pre-oxygenation with 100% oxygen Induction Type: IV induction Ventilation: Mask ventilation without difficulty Laryngoscope Size: Glidescope and 4 Grade View: Grade IV Tube type: Oral Tube size: 8.0 mm Number of attempts: 2 Airway Equipment and Method: Stylet and Video-laryngoscopy Placement Confirmation: ETT inserted through vocal cords under direct vision, positive ETCO2 and breath sounds checked- equal and bilateral Secured at: 25 cm Tube secured with: Tape Dental Injury: Teeth and Oropharynx as per pre-operative assessment  Difficulty Due To: Difficulty was unanticipated Future Recommendations: Recommend- induction with short-acting agent, and alternative techniques readily available Comments: Large head and tongue. Unable to visualize the cords with DVL. Used Glidescope to intubate. Easy visualization with video-laryngoscopy. Easy mask ventilation in the interim. VSS throughout.

## 2023-04-30 NOTE — Transfer of Care (Signed)
Immediate Anesthesia Transfer of Care Note  Patient: Johnny [name]  Procedure(s) Performed: A-FLUTTER ABLATION LOOP RECORDER INSERTION  Patient Location: PACU  Anesthesia Type:General  Level of Consciousness: awake, oriented, and patient cooperative  Airway & Oxygen Therapy: Patient Spontanous Breathing  Post-op Assessment: Report given to RN and Post -op Vital signs reviewed and stable  Post vital signs: Reviewed and stable  Last Vitals:  Vitals Value Taken Time  BP 121/86 1005  Temp 97 1005  Pulse 76 1005  Resp 16 1005  SpO2 99 1005    Last Pain:  Vitals:   04/30/23 0538  TempSrc: Oral  PainSc: 0-No pain         Complications:  Encounter Notable Events  Notable Event Outcome Phase Comment  Difficult to intubate - unexpected  Intraprocedure Filed from anesthesia note documentation.

## 2023-04-30 NOTE — Progress Notes (Signed)
Pt ambulated to hallway ~100 ft with no signs of oozing from bilateral groin sites

## 2023-05-01 ENCOUNTER — Encounter (HOSPITAL_COMMUNITY): Payer: Self-pay | Admitting: Cardiology

## 2023-05-01 MED FILL — Bupivacaine HCl Preservative Free (PF) Inj 0.25%: INTRAMUSCULAR | Qty: 30 | Status: AC

## 2023-05-01 MED FILL — Midazolam HCl Inj 5 MG/5ML (Base Equivalent): INTRAMUSCULAR | Qty: 2 | Status: AC

## 2023-05-01 MED FILL — Fentanyl Citrate Preservative Free (PF) Inj 100 MCG/2ML: INTRAMUSCULAR | Qty: 2 | Status: AC

## 2023-05-03 ENCOUNTER — Other Ambulatory Visit: Payer: Self-pay | Admitting: Internal Medicine

## 2023-05-03 ENCOUNTER — Other Ambulatory Visit: Payer: Self-pay | Admitting: Urology

## 2023-05-03 DIAGNOSIS — N138 Other obstructive and reflux uropathy: Secondary | ICD-10-CM

## 2023-05-08 ENCOUNTER — Encounter: Payer: Self-pay | Admitting: Physical Therapy

## 2023-05-08 ENCOUNTER — Ambulatory Visit (INDEPENDENT_AMBULATORY_CARE_PROVIDER_SITE_OTHER): Payer: BC Managed Care – PPO | Admitting: Physical Therapy

## 2023-05-08 DIAGNOSIS — M6281 Muscle weakness (generalized): Secondary | ICD-10-CM

## 2023-05-08 DIAGNOSIS — R600 Localized edema: Secondary | ICD-10-CM | POA: Diagnosis not present

## 2023-05-08 DIAGNOSIS — R2689 Other abnormalities of gait and mobility: Secondary | ICD-10-CM | POA: Diagnosis not present

## 2023-05-08 DIAGNOSIS — M25662 Stiffness of left knee, not elsewhere classified: Secondary | ICD-10-CM | POA: Diagnosis not present

## 2023-05-08 NOTE — Therapy (Signed)
 OUTPATIENT PHYSICAL THERAPY TREATMENT  Patient Name: Johnny [name] MRN: [id number] DOB:1956-04-08, 68 y.o., male Today's Date: 05/08/2023  END OF SESSION:  PT End of Session - 05/08/23 1201     Visit Number 15    Number of Visits 30    Date for PT Re-Evaluation 06/17/23    Authorization Type [employer] 20% coinsurance    Authorization Time Period recert 04/22/23 to 06/17/23    PT Start Time 1150    PT Stop Time 1228    PT Time Calculation (min) 38 min    Activity Tolerance Patient tolerated treatment well    Behavior During Therapy [facility] for tasks assessed/performed                       Past Medical History:  Diagnosis Date   Arthritis    CKD (chronic kidney disease) stage 3, GFR 30-59 ml/min (HCC) 06/10/2017   Constipation 03/27/2020   Diabetes mellitus    Dysrhythmia    A flutter   Essential hypertension 08/03/2007   Qualifier: Diagnosis of  By: Harlow MD, Ozell BRAVO  Med ARB, diuretic     Guttate psoriasis    Hyperlipidemia associated with type 2 diabetes mellitus (HCC) 08/03/2007   Qualifier: Diagnosis of  By: Harlow MD, Ozell BRAVO  meds - none    Paroxysmal atrial flutter (HCC)    S/P TEE/DCCV 07/2019   PSORIASIS, 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   Qualifier: Diagnosis of  By: Harlow MD, Ozell BRAVO    Sleep apnea    Type II diabetes mellitus with renal manifestations, uncontrolled 08/03/2007   Qualifier: Diagnosis of  By: Harlow MD, Ozell BRAVO  Med metformin     Past Surgical History:  Procedure Laterality Date   A-FLUTTER ABLATION N/A 04/30/2023   Procedure: A-FLUTTER ABLATION;  Surgeon: Kennyth Chew, MD;  Location: [facility] INVASIVE CV LAB;  Service: Cardiovascular;  Laterality: N/A;   CARDIOVERSION N/A 07/18/2019   Procedure: CARDIOVERSION;  Surgeon: Maranda Leim VEAR, MD;  Location: [facility] ENDOSCOPY;  Service: Cardiovascular;  Laterality: N/A;   IRRIGATION AND DEBRIDEMENT KNEE Right 02/18/2023   Procedure: RIGHT KNEE ARTHROSCOPIC IRRIGATION AND DEBRIDEMENT;  Surgeon: Jerri Kay HERO, MD;  Location: [facility] ORS;  Service: Orthopedics;  Laterality: Right;   LOOP RECORDER INSERTION N/A 04/30/2023   Procedure: LOOP RECORDER INSERTION;  Surgeon: Kennyth Chew, MD;  Location: [facility] INVASIVE CV LAB;  Service: Cardiovascular;  Laterality: N/A;   TEE WITHOUT CARDIOVERSION N/A 07/18/2019   Procedure: TRANSESOPHAGEAL ECHOCARDIOGRAM (TEE);  Surgeon: Maranda Leim VEAR, MD;  Location: [facility] ENDOSCOPY;  Service: Cardiovascular;  Laterality: N/A;   TOOTH EXTRACTION     XI ROBOTIC ASSISTED SIMPLE PROSTATECTOMY N/A 05/31/2020   Procedure: XI ROBOTIC ASSISTED SIMPLE PROSTATECTOMY-SUPRAPUBIC;  Surgeon: Sherrilee Belvie CROME, MD;  Location: [facility] ORS;  Service: Urology;  Laterality: N/A;   Patient Active Problem List   Diagnosis Date Noted   Rheumatoid factor positive 02/10/2023   Knee pain, right 01/21/2023   Hyperlipidemia 01/20/2023   Effusion, right knee 01/20/2023   Hypercoagulable state due to typical atrial flutter (HCC) 01/13/2023   Venous stasis of lower extremity 01/09/2023   BPH with obstruction/lower urinary tract symptoms 05/31/2020   Paroxysmal atrial flutter (HCC)    Adrenal nodule (HCC) 03/28/2020   OSA (obstructive sleep apnea) 03/28/2020   Morbid obesity (HCC) 03/28/2020   Medication noncompliance due to cognitive impairment 03/28/2020   Chronic systolic CHF (congestive heart failure) (HCC) 03/28/2020   CKD (chronic kidney disease) stage 4, GFR 15-29 ml/min (  HCC) 06/10/2017   PSORIASIS, 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   Type 2 diabetes with complication (HCC) 08/03/2007   Hyperlipidemia associated with type 2 diabetes mellitus (HCC) 08/03/2007   Essential hypertension 08/03/2007    PCP: Rollene Almarie LABOR, MD  REFERRING PROVIDER: Jerri Kay HERO, MD   REFERRING DIAG: [id number] (ICD-10-CM) - Effusion, right knee   THERAPY DIAG:  Muscle weakness (generalized)  Stiffness of left knee, not elsewhere classified  Other abnormalities of gait and mobility  Localized  edema  Rationale for Evaluation and Treatment: Rehabilitation  ONSET DATE: Right knee scope, irrigation and debriement, major synovectomy 02/18/2023  SUBJECTIVE:   SUBJECTIVE STATEMENT:  Feeling good after the ablation surgery, had some chest discomfort a couple days after. Surgeon said OK to come back to PT but don't push too hard for 2 weeks after surgery (out to 05/14/23). Have stitches from the cath in my groin, they are supposed to dissolve. After the surgery I was walking around the house without the cane, I was tinkering with the smoker I got for christmas and feeling well no issue. Yesterday I went to [employer] and got some items, was going upstairs with items in my arms and my right leg buckled and I fell but on top of some paper towels so not a big fall/no injury.     PERTINENT HISTORY: Right knee scope 02/18/2023 PMH:RA,OSA,Obesity,CHF,CKD,DM2 To wear knee immobilizer until quad function improves  PAIN:  NPRS scale:   0/10 now, at worst over past week up to 4/10 at worst with certain turns  Pain location:  Pain description:  Aggravating factors:  Relieving factors:    PRECAUTIONS: None  RED FLAGS: None   WEIGHT BEARING RESTRICTIONS: No  FALLS:  Has patient fallen in last 6 months? 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  DIAGNOSTIC FINDINGS:  03/09/2023 review:  XR 01/05/23 IMPRESSION: Mild osteoarthritis with moderate knee joint effusion.  PATIENT SURVEYS:  03/09/2023 FOTO 57% functional goal is 71%; 04/22/23- 55%   COGNITION: 03/09/2023 Overall cognitive status: Within functional limits for tasks assessed      EDEMA:  03/09/2023 Moderate edema noted in Rt knee and Lower leg   LOWER  EXTREMITY ROM:   AROM/PROM Right eval Right 03/13/2023 Right 03/19/23 Right 04/22/23  Hip flexion      Hip extension      Hip abduction      Hip adduction      Hip internal rotation      Hip external rotation      Knee flexion 60/80 Passive 97  Supine AROM 107*  Knee extension 15/10 Passive lacks 10 Supine P: -8* A: quad set -10* Supine AROM -9*  Ankle dorsiflexion      Ankle plantarflexion      Ankle inversion      Ankle eversion       (Blank rows = not tested)  LOWER EXTREMITY MMT:  MMT Right eval Left 04/06/23 Right 04/13/2023 Left 04/13/2023 Right 04/22/23 Left 04/22/23  Hip flexion 2       Hip extension        Hip abduction        Hip adduction        Hip internal rotation        Hip  external rotation        Knee flexion 3 4   4 5   Knee extension 2 3 13.3, 12.7 lbs 5/5 93.6 lbs 3- 5  Ankle dorsiflexion        Ankle plantarflexion        Ankle inversion        Ankle eversion         (Blank rows = not tested)  GAIT: 04/16/2023: SPC use in Rt UE per Pt preference despite encouragement for use in Lt arm.   04/01/2023:   FWW or crutches reported.   FWW into clinc.   03/09/2023 Comments: Currently using RW and in knee immobilizer druing ambulation. He did not use AD PLOF                   TODAY'S TREATMENT:                                                           DATE:      05/08/23  TherEx  Nustep L5 x8 minutes BLEs only (HR 63 prior to exercise, 63 at end of 8 minutes) LAQ reverse ladder: 5# 2x10 cues not to extend trunk, 8# 2x7, 10# 2x5 (HR stable in 60s, RPE 10/10 as weight on quads increased) Seated SLRs 2# x12 (RPE 8/10, HR 71) STS 5# goblet hold from 26.5 inch mat table x10 cues to breathe out     04/22/23   FOTO, objective measures as above, goal review and education on progress/POC    TherEx  Nustep L8 x12 minutes BLEs only Shuttle LE press 112# 2x15 double leg Shuttle LE press 43# 2x15 single leg       04/20/23  TherEx  Nustep L8 x12 minutes BLEs only  Shuttle LE press double leg: 100# 2x15 power up/slow lower  Shuttle LE press single leg 43# 2x10 power up/slow lower    NMR  Tandem gait forward/backward in // bars x4 laps 3 way taps blue foam pad in // bars 2x5 B SLS with one foot on 6 inch box 2x30 seconds B blue foam pad     04/16/2023: Therapeutic Exercise: Nustep seat 15 level 8 with BLEs only for 12 min Seated isometric on knee extension machine 5 sec on /off Rt leg x 12 in slot 3 (75-80 deg knee flexion approx) and 4 (65 deg knee flexion approx.)  TherActivity Leg press double leg 100 lbs 2 x 15 slow lowering, single leg Rt 37 lbs 2 x 15 c slow lowering Step up in // bars with Lt hand on bar 4 inch step x 15 WB on Rt leg  Neuro Re-ed Tandem ambulation in // bars fwd/back 10 ft x 5 each way with occasional to moderate HHA Forward/back church pew anterior/posterior ankle weight shift 1 min in // bars with occasional HHA  TODAY'S TREATMENT:                                                           DATE:   04/13/2023: Therapeutic Exercise: Nustep seat 15 level 8 with BLEs only for 10 min Leg press  double leg 100 lbs 2 x 15 slow lowering, single leg Rt 37 lbs 2 x 15 c slow lowering Standing incline gastroc stretch bilateral 30 sec x 3  Seated Rt leg LAQ AROM x 5 Seated Rt leg quad set 5 sec hold x 5 Seated Rt leg quad set with SLR attempt just off floor 2 x 5 (cues for mixing in at home)   Neuro Re-ed Tandem stance 1 min x 2 bilateral with occasional HHA on bars.  Forward/back church pew anterior/posterior ankle weight shift 1 min in // bars with occasional HHA Time spent with visual demonstration and verbal cues for activity.     TODAY'S TREATMENT:                                                           DATE:  04/06/2023: Therapeutic Exercise: Nustep seat 15 level 8 with BLEs only for 8 min Seated Rt knee LAQ  to available range, then strap assisted  end range then eccentric lowering3 x 10  Supine SLR X 10, no assist needed today Supine TKE stretch 3 min heel prop   TherActivity Leg press double leg 93 lbs 3 x 10 slow lowering, single leg Rt 37 lbs 3 x 10 Step up with bilateral hands on bar x 15 forward and X 15 lateral Rt leg WB 6 inch step   Vasopnuematic  X 10 min, medium compression, 34 deg to Rt knee in elevation  PATIENT EDUCATION: 03/09/2023 Education details: HEP, PT plan of care Person educated: Patient Education method: Explanation, Demonstration, Verbal cues, and Handouts Education comprehension: verbalized understanding and needs further education   HOME EXERCISE PROGRAM: Access Code: [facility] URL: [email or web address] Date: 03/19/2023 Prepared by: Grayce Spatz  Exercises - Supine Quad Set  - 2 x daily - 6 x weekly - 2-3 sets - 10 reps - 5 sec hold - Supine Heel Slide with Strap  - 2 x daily - 6 x weekly - 2-3 sets - 10 reps - 5 hold - straight leg raise with help from strap  - 2 x daily - 6 x weekly - 1-2 sets - 10 reps - 10 seconds hold - Seated March  - 2 x daily - 6 x weekly - 2-3 sets - 10 reps - Seated Long Arc Quad  - 2 x daily - 6 x weekly - 2-3 sets - 10 reps - 3 hold - Long Sitting Quad Set with Towel Roll Under Heel  - 5 x daily - 7 x weekly - 2 sets - 10 reps - 5 seconds hold - Prone Knee Extension with Ankle Weight  - 2-3 x daily - 7 x weekly - 1 sets - 2-3 reps - 2 minutes hold - Prone Knee Flexion  - 2-3 x daily - 7 x weekly - 2-3 sets - 10 reps - 5 seconds hold - Prone Knee Extension Overpressure  - 2-3 x daily - 7 x weekly - 2-3 sets - 10 reps - 5 seconds hold   ASSESSMENT:  CLINICAL IMPRESSION:    Pt arrives today doing OK, sounds like his ablation procedure went well. Per pt, he was on complete rest/no activity for 5-6 days after the ablation and has instructions from surgeon to not push to 100% for 2 weeks after procedure (out to 05/14/23).  Monitored HR closely today, also  dropped intensity of interventions to follow surgical team guidelines for exercise after ablation. Will continue to progress as appropriate. RPE 5/10 for session as a whole, HR stable throughout.      OBJECTIVE IMPAIRMENTS: decreased activity tolerance, difficulty walking, decreased balance, decreased endurance, decreased mobility, decreased ROM, decreased strength, impaired flexibility, impaired LE use, and pain.  ACTIVITY LIMITATIONS: bending, lifting, carry, locomotion, cleaning, community activity, driving, and  occupation  PERSONAL FACTORS: see above PMH are also affecting patient's functional outcome.  REHAB POTENTIAL: Good  CLINICAL DECISION MAKING: Stable/uncomplicated  EVALUATION COMPLEXITY: Low    GOALS: Short term PT Goals Target date: 05/20/2023     Pt will be I and compliant with HEP. Baseline:  Goal status: MET 04/22/23   Long term PT goals 06/17/2023     Pt will improve Rt knee ROM to [facility] (3-115 deg) to improve functional mobility Baseline: Goal status: ONGOING 04/22/23  Pt will improve Rt hip/knee strength to at least 4+/5 MMT to improve functional strength Baseline: Goal status: ONGOING 04/22/23  Pt will improve FOTO to at least 71% functional to show improved function Baseline: Goal status: ONGOING 04/22/23  Pt will reduce pain to overall less than 2-3/10 with usual activity and feel ready to return to work activity. Baseline: Goal status: PARTIALLY MET 04/22/23- has met goal with exception of when LE is in externally rotated position at night and when exerting on LE press   Pt will be able to ambulate community distances at least 1000 ft WNL gait pattern without AD Baseline: Goal status: ONGOING 04/22/23  PLAN: PT FREQUENCY: 2x/week   PT DURATION: 8 weeks   PLANNED INTERVENTIONS (unless contraindicated): aquatic PT, Canalith repositioning, cryotherapy, Electrical stimulation, Iontophoresis with 4 mg/ml dexamethasome, Moist heat, traction,  Ultrasound, gait training, Therapeutic exercise, balance training, neuromuscular re-education, patient/family education, prosthetic training, manual techniques, passive ROM, dry needling, taping, vasopnuematic device, vestibular, spinal manipulations, joint manipulations 97110-Therapeutic exercises, 97530- Therapeutic activity, [id number]- Neuromuscular re-education, 97535- Self Care, and [id number]- Manual therapy  PLAN FOR NEXT SESSION: Cane practice, progressive strengthening in various postioning. Gait training without cane as appropriate. Had ablation 04/30/23. Monitor HR and RPE with activity, adjust interventions as appropriate.   Per pt, surgeon recommended not pushing to max effort/lowering intensity of activity for 2 weeks after ablation until 05/14/23).   Josette Rough, PT, DPT 05/08/23 12:28 PM

## 2023-05-12 ENCOUNTER — Other Ambulatory Visit: Payer: Self-pay

## 2023-05-12 ENCOUNTER — Encounter: Payer: Self-pay | Admitting: Rehabilitative and Restorative Service Providers"

## 2023-05-12 ENCOUNTER — Telehealth: Payer: Self-pay | Admitting: Internal Medicine

## 2023-05-12 ENCOUNTER — Ambulatory Visit: Payer: BC Managed Care – PPO | Admitting: Rehabilitative and Restorative Service Providers"

## 2023-05-12 DIAGNOSIS — M25662 Stiffness of left knee, not elsewhere classified: Secondary | ICD-10-CM

## 2023-05-12 DIAGNOSIS — R600 Localized edema: Secondary | ICD-10-CM

## 2023-05-12 DIAGNOSIS — R2689 Other abnormalities of gait and mobility: Secondary | ICD-10-CM

## 2023-05-12 DIAGNOSIS — M6281 Muscle weakness (generalized): Secondary | ICD-10-CM

## 2023-05-12 MED ORDER — GLIPIZIDE 5 MG PO TABS: 5.0000 mg | ORAL_TABLET | Freq: Two times a day (BID) | ORAL | 2 refills | Status: DC

## 2023-05-12 NOTE — Therapy (Signed)
 OUTPATIENT PHYSICAL THERAPY TREATMENT  Patient Name: Johnny [name] MRN: [id number] DOB:01-06-1956, 68 y.o., male Today's Date: 05/12/2023  END OF SESSION:  PT End of Session - 05/12/23 1343     Visit Number 16    Number of Visits 30    Date for PT Re-Evaluation 06/17/23    Authorization Type [employer] 20% coinsurance    Authorization Time Period recert 04/22/23 to 06/17/23    PT Start Time 1344    PT Stop Time 1424    PT Time Calculation (min) 40 min    Activity Tolerance Patient tolerated treatment well    Behavior During Therapy [facility] for tasks assessed/performed                        Past Medical History:  Diagnosis Date   Arthritis    CKD (chronic kidney disease) stage 3, GFR 30-59 ml/min (HCC) 06/10/2017   Constipation 03/27/2020   Diabetes mellitus    Dysrhythmia    A flutter   Essential hypertension 08/03/2007   Qualifier: Diagnosis of  By: Harlow MD, Ozell BRAVO  Med ARB, diuretic     Guttate psoriasis    Hyperlipidemia associated with type 2 diabetes mellitus (HCC) 08/03/2007   Qualifier: Diagnosis of  By: Harlow MD, Ozell BRAVO  meds - none    Paroxysmal atrial flutter (HCC)    S/P TEE/DCCV 07/2019   PSORIASIS, 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   Qualifier: Diagnosis of  By: Harlow MD, Ozell BRAVO    Sleep apnea    Type II diabetes mellitus with renal manifestations, uncontrolled 08/03/2007   Qualifier: Diagnosis of  By: Harlow MD, Ozell BRAVO  Med metformin     Past Surgical History:  Procedure Laterality Date   A-FLUTTER ABLATION N/A 04/30/2023   Procedure: A-FLUTTER ABLATION;  Surgeon: Kennyth Chew, MD;  Location: [facility] INVASIVE CV LAB;  Service: Cardiovascular;  Laterality: N/A;   CARDIOVERSION N/A 07/18/2019   Procedure: CARDIOVERSION;  Surgeon: Maranda Leim VEAR, MD;  Location: [facility] ENDOSCOPY;  Service: Cardiovascular;  Laterality: N/A;   IRRIGATION AND DEBRIDEMENT KNEE Right 02/18/2023   Procedure: RIGHT KNEE ARTHROSCOPIC IRRIGATION AND DEBRIDEMENT;  Surgeon: Jerri Kay HERO, MD;  Location: [facility] ORS;  Service: Orthopedics;  Laterality: Right;   LOOP RECORDER INSERTION N/A 04/30/2023   Procedure: LOOP RECORDER INSERTION;  Surgeon: Kennyth Chew, MD;  Location: [facility] INVASIVE CV LAB;  Service: Cardiovascular;  Laterality: N/A;   TEE WITHOUT CARDIOVERSION N/A 07/18/2019   Procedure: TRANSESOPHAGEAL ECHOCARDIOGRAM (TEE);  Surgeon: Maranda Leim VEAR, MD;  Location: [facility] ENDOSCOPY;  Service: Cardiovascular;  Laterality: N/A;   TOOTH EXTRACTION     XI ROBOTIC ASSISTED SIMPLE PROSTATECTOMY N/A 05/31/2020   Procedure: XI ROBOTIC ASSISTED SIMPLE PROSTATECTOMY-SUPRAPUBIC;  Surgeon: Sherrilee Belvie CROME, MD;  Location: [facility] ORS;  Service: Urology;  Laterality: N/A;   Patient Active Problem List   Diagnosis Date Noted   Rheumatoid factor positive 02/10/2023   Knee pain, right 01/21/2023   Hyperlipidemia 01/20/2023   Effusion, right knee 01/20/2023   Hypercoagulable state due to typical atrial flutter (HCC) 01/13/2023   Venous stasis of lower extremity 01/09/2023   BPH with obstruction/lower urinary tract symptoms 05/31/2020   Paroxysmal atrial flutter (HCC)    Adrenal nodule (HCC) 03/28/2020   OSA (obstructive sleep apnea) 03/28/2020   Morbid obesity (HCC) 03/28/2020   Medication noncompliance due to cognitive impairment 03/28/2020   Chronic systolic CHF (congestive heart failure) (HCC) 03/28/2020   CKD (chronic kidney disease) stage 4, GFR 15-29  ml/min (HCC) 06/10/2017   PSORIASIS, 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   Type 2 diabetes with complication (HCC) 08/03/2007   Hyperlipidemia associated with type 2 diabetes mellitus (HCC) 08/03/2007   Essential hypertension 08/03/2007    PCP: Rollene Almarie LABOR, MD  REFERRING PROVIDER: Jerri Kay HERO, MD   REFERRING DIAG: [phone] (ICD-10-CM) - Effusion, right knee   THERAPY DIAG:  Muscle weakness (generalized)  Stiffness of left knee, not elsewhere classified  Other abnormalities of gait and mobility  Localized  edema  Rationale for Evaluation and Treatment: Rehabilitation  ONSET DATE: Right knee scope, irrigation and debriement, major synovectomy 02/18/2023  SUBJECTIVE:   SUBJECTIVE STATEMENT: Pt stated he continued to improve.  Walking with SPC and sometimes not.    PERTINENT HISTORY: Right knee scope 02/18/2023 PMH:RA,OSA,Obesity,CHF,CKD,DM2 To wear knee immobilizer until quad function improves  PAIN:  NPRS scale:   0/10 now, at worst over past week up to 4/10 at worst with certain turns  Pain location:  Pain description:  Aggravating factors:  Relieving factors:    PRECAUTIONS: None  RED FLAGS: None   WEIGHT BEARING RESTRICTIONS: No  FALLS:  Has patient fallen in last 6 months? 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  DIAGNOSTIC FINDINGS:  03/09/2023 review:  XR 01/05/23 IMPRESSION: Mild osteoarthritis with moderate knee joint effusion.  PATIENT SURVEYS:  03/09/2023 FOTO 57% functional goal is 71%; 04/22/23- 55%   COGNITION: 03/09/2023 Overall cognitive status: Within functional limits for tasks assessed      EDEMA:  03/09/2023 Moderate edema noted in Rt knee and Lower leg   LOWER EXTREMITY ROM:   AROM/PROM Right eval Right 03/13/2023 Right 03/19/23 Right 04/22/23  Hip flexion      Hip extension      Hip abduction      Hip adduction      Hip internal rotation      Hip external rotation      Knee flexion 60/80 Passive 97  Supine AROM 107*  Knee extension 15/10 Passive lacks 10 Supine P: -8* A: quad set -10* Supine AROM -9*  Ankle dorsiflexion      Ankle plantarflexion      Ankle inversion      Ankle eversion       (Blank rows = not tested)  LOWER EXTREMITY MMT:  MMT Right eval  Left 04/06/23 Right 04/13/2023 Left 04/13/2023 Right 04/22/23 Left 04/22/23 Right 05/12/2023  Hip flexion 2        Hip extension         Hip abduction         Hip adduction         Hip internal rotation         Hip external rotation         Knee flexion 3 4   4 5    Knee extension 2 3 13.3, 12.7 lbs 5/5 93.6 lbs 3- 5 4/5 30.3, 33 lbs  Ankle dorsiflexion         Ankle plantarflexion         Ankle inversion         Ankle eversion          (Blank rows = not tested)  GAIT: 04/16/2023: SPC use in Rt UE per Pt preference  despite encouragement for use in Lt arm.   04/01/2023:   FWW or crutches reported.   FWW into clinc.   03/09/2023 Comments: Currently using RW and in knee immobilizer druing ambulation. He did not use AD PLOF                   TODAY'S TREATMENT:                                                           DATE:   05/12/2023 Therex: Nustep lvl 6 LE only 10 mins Knee extension machine double leg up, Rt leg lowering slowly 2 x 15 5 lbs  Seated quad set 5 sec hold x 5 Rt  Seated quad set with SLR Rt x 15 Verbal reviews of strengthening in gym based equipment.   TherActivity Leg press double leg 125 lbs x 20, single leg Rt 43 lbs 2 x 20 Sit to stand to sit 25 inch table 10 lb kettle bell with slow lowering focus 2  x 10     TODAY'S TREATMENT:                                                           DATE:   05/08/2023 TherEx Nustep L5 x8 minutes BLEs only (HR 63 prior to exercise, 63 at end of 8 minutes) LAQ reverse ladder: 5# 2x10 cues not to extend trunk, 8# 2x7, 10# 2x5 (HR stable in 60s, RPE 10/10 as weight on quads increased) Seated SLRs 2# x12 (RPE 8/10, HR 71) STS 5# goblet hold from 26.5 inch mat table x10 cues to breathe out     TODAY'S TREATMENT:                                                           DATE:  04/22/23 FOTO, objective measures as above, goal review and education on progress/POC  TherEx Nustep L8 x12 minutes BLEs only Shuttle LE press  112# 2x15 double leg Shuttle LE press 43# 2x15 single leg   TODAY'S TREATMENT:                                                           DATE: 04/20/23 TherEx Nustep L8 x12 minutes BLEs only  Shuttle LE press double leg: 100# 2x15 power up/slow lower  Shuttle LE press single leg 43# 2x10 power up/slow lower NMR  Tandem gait forward/backward in // bars x4 laps 3 way taps blue foam pad in // bars 2x5 B SLS with one foot on 6 inch box 2x30 seconds B blue foam pad   TODAY'S TREATMENT:  DATE:04/16/2023: Therapeutic Exercise: Nustep seat 15 level 8 with BLEs only for 12 min Seated isometric on knee extension machine 5 sec on /off Rt leg x 12 in slot 3 (75-80 deg knee flexion approx) and 4 (65 deg knee flexion approx.)  TherActivity Leg press double leg 100 lbs 2 x 15 slow lowering, single leg Rt 37 lbs 2 x 15 c slow lowering Step up in // bars with Lt hand on bar 4 inch step x 15 WB on Rt leg  Neuro Re-ed Tandem ambulation in // bars fwd/back 10 ft x 5 each way with occasional to moderate HHA Forward/back [employer] pew anterior/posterior ankle weight shift 1 min in // bars with occasional HHA   PATIENT EDUCATION: 03/09/2023 Education details: HEP, PT plan of care Person educated: Patient Education method: Explanation, Demonstration, Verbal cues, and Handouts Education comprehension: verbalized understanding and needs further education   HOME EXERCISE PROGRAM: Access Code: [facility] URL: [email or web address] Date: 03/19/2023 Prepared by: Grayce Spatz  Exercises - Supine Quad Set  - 2 x daily - 6 x weekly - 2-3 sets - 10 reps - 5 sec hold - Supine Heel Slide with Strap  - 2 x daily - 6 x weekly - 2-3 sets - 10 reps - 5 hold - straight leg raise with help from strap  - 2 x daily - 6 x weekly - 1-2 sets - 10 reps - 10 seconds hold - Seated March  - 2 x daily - 6 x weekly - 2-3 sets - 10 reps - Seated Long Arc  Quad  - 2 x daily - 6 x weekly - 2-3 sets - 10 reps - 3 hold - Long Sitting Quad Set with Towel Roll Under Heel  - 5 x daily - 7 x weekly - 2 sets - 10 reps - 5 seconds hold - Prone Knee Extension with Ankle Weight  - 2-3 x daily - 7 x weekly - 1 sets - 2-3 reps - 2 minutes hold - Prone Knee Flexion  - 2-3 x daily - 7 x weekly - 2-3 sets - 10 reps - 5 seconds hold - Prone Knee Extension Overpressure  - 2-3 x daily - 7 x weekly - 2-3 sets - 10 reps - 5 seconds hold   ASSESSMENT:  CLINICAL IMPRESSION:   Strength was improved with dynamometry recordings but still lacking compared to opposite leg.  In general, Pt has demonstrated improved WB acceptance in daily mobility.  Continued skilled PT services important to continue gains.     OBJECTIVE IMPAIRMENTS: decreased activity tolerance, difficulty walking, decreased balance, decreased endurance, decreased mobility, decreased ROM, decreased strength, impaired flexibility, impaired LE use, and pain.  ACTIVITY LIMITATIONS: bending, lifting, carry, locomotion, cleaning, community activity, driving, and  occupation  PERSONAL FACTORS: see above PMH are also affecting patient's functional outcome.  REHAB POTENTIAL: Good  CLINICAL DECISION MAKING: Stable/uncomplicated  EVALUATION COMPLEXITY: Low    GOALS: Short term PT Goals Target date: 05/20/2023     Pt will be I and compliant with HEP. Baseline:  Goal status: MET 04/22/23   Long term PT goals 06/17/2023     Pt will improve Rt knee ROM to [facility] (3-115 deg) to improve functional mobility Baseline: Goal status: ONGOING 04/22/23  Pt will improve Rt hip/knee strength to at least 4+/5 MMT to improve functional strength Baseline: Goal status: ONGOING 04/22/23  Pt will improve FOTO to at least 71% functional to show improved function Baseline: Goal status: ONGOING 04/22/23  Pt will reduce  pain to overall less than 2-3/10 with usual activity and feel ready to return to work  activity. Baseline: Goal status: PARTIALLY MET 04/22/23- has met goal with exception of when LE is in externally rotated position at night and when exerting on LE press   Pt will be able to ambulate community distances at least 1000 ft WNL gait pattern without AD Baseline: Goal status: ONGOING 04/22/23  PLAN: PT FREQUENCY: 2x/week   PT DURATION: 8 weeks   PLANNED INTERVENTIONS (unless contraindicated): aquatic PT, Canalith repositioning, cryotherapy, Electrical stimulation, Iontophoresis with 4 mg/ml dexamethasome, Moist heat, traction, Ultrasound, gait training, Therapeutic exercise, balance training, neuromuscular re-education, patient/family education, prosthetic training, manual techniques, passive ROM, dry needling, taping, vasopnuematic device, vestibular, spinal manipulations, joint manipulations [id number]-Therapeutic exercises, 97530- Therapeutic activity, [id number]- Neuromuscular re-education, 97535- Self Care, and [id number]- Manual therapy  PLAN FOR NEXT SESSION: Dynamic balance, WB strengthening program.   Had ablation 04/30/23. Monitor HR and RPE with activity, adjust interventions as appropriate.     Ozell Silvan, PT, DPT, OCS, ATC 05/12/23  2:25 PM

## 2023-05-12 NOTE — Telephone Encounter (Signed)
 Patient is in the office with RX bottle in hand.  His Glipizide was prescribed for him to take 2 5MG  tablets per day and was for 90 days.  The bottle in his hand has instructions to take 1 5MG  a day.   Patient is confused about dosage and instructions.

## 2023-05-12 NOTE — Telephone Encounter (Signed)
 Resend the prescription with correct directions

## 2023-05-13 ENCOUNTER — Ambulatory Visit: Payer: BC Managed Care – PPO | Admitting: Podiatry

## 2023-05-14 ENCOUNTER — Encounter: Payer: Self-pay | Admitting: Rehabilitative and Restorative Service Providers"

## 2023-05-14 ENCOUNTER — Ambulatory Visit (INDEPENDENT_AMBULATORY_CARE_PROVIDER_SITE_OTHER): Payer: BC Managed Care – PPO | Admitting: Rehabilitative and Restorative Service Providers"

## 2023-05-14 DIAGNOSIS — R600 Localized edema: Secondary | ICD-10-CM

## 2023-05-14 DIAGNOSIS — M25662 Stiffness of left knee, not elsewhere classified: Secondary | ICD-10-CM

## 2023-05-14 DIAGNOSIS — R2689 Other abnormalities of gait and mobility: Secondary | ICD-10-CM

## 2023-05-14 DIAGNOSIS — M6281 Muscle weakness (generalized): Secondary | ICD-10-CM

## 2023-05-14 NOTE — Therapy (Addendum)
 OUTPATIENT PHYSICAL THERAPY TREATMENT  Patient Name: Johnny [name] MRN: [id number] DOB:02/15/1956, 68 y.o., male Today's Date: 05/14/2023  END OF SESSION:  PT End of Session - 05/14/23 1010     Visit Number 17    Number of Visits 30    Date for PT Re-Evaluation 06/17/23    Authorization Type [employer] 20% coinsurance    Authorization Time Period recert 04/22/23 to 06/17/23    Progress Note Due on Visit 24    PT Start Time 1009    PT Stop Time 1049    PT Time Calculation (min) 40 min    Activity Tolerance Patient tolerated treatment well    Behavior During Therapy [facility] for tasks assessed/performed              Past Medical History:  Diagnosis Date   Arthritis    CKD (chronic kidney disease) stage 3, GFR 30-59 ml/min (HCC) 06/10/2017   Constipation 03/27/2020   Diabetes mellitus    Dysrhythmia    A flutter   Essential hypertension 08/03/2007   Qualifier: Diagnosis of  By: Harlow MD, Ozell BRAVO  Med ARB, diuretic     Guttate psoriasis    Hyperlipidemia associated with type 2 diabetes mellitus (HCC) 08/03/2007   Qualifier: Diagnosis of  By: Harlow MD, Ozell BRAVO  meds - none    Paroxysmal atrial flutter (HCC)    S/P TEE/DCCV 07/2019   PSORIASIS, 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   Qualifier: Diagnosis of  By: Harlow MD, Ozell BRAVO    Sleep apnea    Type II diabetes mellitus with renal manifestations, uncontrolled 08/03/2007   Qualifier: Diagnosis of  By: Harlow MD, Ozell BRAVO  Med metformin     Past Surgical History:  Procedure Laterality Date   A-FLUTTER ABLATION N/A 04/30/2023   Procedure: A-FLUTTER ABLATION;  Surgeon: Kennyth Chew, MD;  Location: [facility] INVASIVE CV LAB;  Service: Cardiovascular;  Laterality: N/A;   CARDIOVERSION N/A 07/18/2019   Procedure: CARDIOVERSION;  Surgeon: Maranda Leim VEAR, MD;  Location: [facility] ENDOSCOPY;  Service: Cardiovascular;  Laterality: N/A;   IRRIGATION AND DEBRIDEMENT KNEE Right 02/18/2023   Procedure: RIGHT KNEE ARTHROSCOPIC IRRIGATION AND DEBRIDEMENT;   Surgeon: Jerri Kay HERO, MD;  Location: [facility] ORS;  Service: Orthopedics;  Laterality: Right;   LOOP RECORDER INSERTION N/A 04/30/2023   Procedure: LOOP RECORDER INSERTION;  Surgeon: Kennyth Chew, MD;  Location: [facility] INVASIVE CV LAB;  Service: Cardiovascular;  Laterality: N/A;   TEE WITHOUT CARDIOVERSION N/A 07/18/2019   Procedure: TRANSESOPHAGEAL ECHOCARDIOGRAM (TEE);  Surgeon: Maranda Leim VEAR, MD;  Location: [facility] ENDOSCOPY;  Service: Cardiovascular;  Laterality: N/A;   TOOTH EXTRACTION     XI ROBOTIC ASSISTED SIMPLE PROSTATECTOMY N/A 05/31/2020   Procedure: XI ROBOTIC ASSISTED SIMPLE PROSTATECTOMY-SUPRAPUBIC;  Surgeon: Sherrilee Belvie CROME, MD;  Location: [facility] ORS;  Service: Urology;  Laterality: N/A;   Patient Active Problem List   Diagnosis Date Noted   Rheumatoid factor positive 02/10/2023   Knee pain, right 01/21/2023   Hyperlipidemia 01/20/2023   Effusion, right knee 01/20/2023   Hypercoagulable state due to typical atrial flutter (HCC) 01/13/2023   Venous stasis of lower extremity 01/09/2023   BPH with obstruction/lower urinary tract symptoms 05/31/2020   Paroxysmal atrial flutter (HCC)    Adrenal nodule (HCC) 03/28/2020   OSA (obstructive sleep apnea) 03/28/2020   Morbid obesity (HCC) 03/28/2020   Medication noncompliance due to cognitive impairment 03/28/2020   Chronic systolic CHF (congestive heart failure) (HCC) 03/28/2020   CKD (chronic kidney disease) stage 4, GFR 15-29 ml/min (  HCC) 06/10/2017   PSORIASIS, 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   Type 2 diabetes with complication (HCC) 08/03/2007   Hyperlipidemia associated with type 2 diabetes mellitus (HCC) 08/03/2007   Essential hypertension 08/03/2007    PCP: Rollene Almarie LABOR, MD  REFERRING PROVIDER: Jerri Kay HERO, MD   REFERRING DIAG: [phone] (ICD-10-CM) - Effusion, right knee   THERAPY DIAG:  Muscle weakness (generalized)  Stiffness of left knee, not elsewhere classified  Other abnormalities of gait and mobility  Localized  edema  Rationale for Evaluation and Treatment: Rehabilitation  ONSET DATE: Right knee scope, irrigation and debriement, major synovectomy 02/18/2023  SUBJECTIVE:   SUBJECTIVE STATEMENT: Pt stated he continued to improve.  Walking with SPC and sometimes not.    PERTINENT HISTORY: Right knee scope 02/18/2023 PMH:RA,OSA,Obesity,CHF,CKD,DM2 To wear knee immobilizer until quad function improves  PAIN:  NPRS scale:   0/10 now Pain location:  Pain description:  Aggravating factors:  Relieving factors:    PRECAUTIONS: None  RED FLAGS: None   WEIGHT BEARING RESTRICTIONS: No  FALLS:  Has patient fallen in last 6 months? 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  DIAGNOSTIC FINDINGS:  03/09/2023 review:  XR 01/05/23 IMPRESSION: Mild osteoarthritis with moderate knee joint effusion.  PATIENT SURVEYS:  03/09/2023 FOTO 57% functional goal is 71%; 04/22/23- 55%   COGNITION: 03/09/2023 Overall cognitive status: Within functional limits for tasks assessed      EDEMA:  03/09/2023 Moderate edema noted in Rt knee and Lower leg   LOWER EXTREMITY ROM:   AROM/PROM Right eval Right 03/13/2023 Right 03/19/23 Right 04/22/23  Hip flexion      Hip extension      Hip abduction      Hip adduction      Hip internal rotation      Hip external rotation      Knee flexion 60/80 Passive 97  Supine AROM 107*  Knee extension 15/10 Passive lacks 10 Supine P: -8* A: quad set -10* Supine AROM -9*  Ankle dorsiflexion      Ankle plantarflexion      Ankle inversion      Ankle eversion       (Blank rows = not tested)  LOWER EXTREMITY MMT:  MMT Right eval Left 04/06/23 Right 04/13/2023 Left 04/13/2023 Right 04/22/23 Left  04/22/23 Right 05/12/2023  Hip flexion 2        Hip extension         Hip abduction         Hip adduction         Hip internal rotation         Hip external rotation         Knee flexion 3 4   4 5    Knee extension 2 3 13.3, 12.7 lbs 5/5 93.6 lbs 3- 5 4/5 30.3, 33 lbs  Ankle dorsiflexion         Ankle plantarflexion         Ankle inversion         Ankle eversion          (Blank rows = not tested)  GAIT: 04/16/2023: SPC use in Rt UE per Pt preference despite encouragement for use in Lt arm.   04/01/2023:   FWW or crutches  reported.   FWW into clinc.   03/09/2023 Comments: Currently using RW and in knee immobilizer druing ambulation. He did not use AD PLOF                   TODAY'S TREATMENT:                                                           DATE:   05/14/2023 Therex: Nustep lvl 6 LE only 12 mins Knee extension machine double leg up, Rt leg lowering slowly 3 x 15 5 lbs  Machine knee flexion Rt leg only 2 x 15 20 lbs    Neuro Re-ed SLS on black mat with contralateral leg corner touching x8 each , performed bilaterally  Tandem ambulation fwd/back 6 ft on foam x 6 each way with occasional HHA on bar  TherActivity Leg press  single leg Rt 50 lbs 2 x 20 Lateral step up/down WB on Rt leg x 10 Step on over and down 4 inch step WB on Rt leg x 10 with single hand assist  TODAY'S TREATMENT:                                                           DATE:   05/12/2023 Therex: Nustep lvl 6 LE only 10 mins Knee extension machine double leg up, Rt leg lowering slowly 2 x 15 5 lbs  Seated quad set 5 sec hold x 5 Rt  Seated quad set with SLR Rt x 15 Verbal reviews of strengthening in gym based equipment.   TherActivity Leg press double leg 125 lbs x 20, single leg Rt 43 lbs 2 x 20 Sit to stand to sit 25 inch table 10 lb kettle bell with slow lowering focus 2  x 10     TODAY'S TREATMENT:                                                           DATE:    05/08/2023 TherEx Nustep L5 x8 minutes BLEs only (HR 63 prior to exercise, 63 at end of 8 minutes) LAQ reverse ladder: 5# 2x10 cues not to extend trunk, 8# 2x7, 10# 2x5 (HR stable in 60s, RPE 10/10 as weight on quads increased) Seated SLRs 2# x12 (RPE 8/10, HR 71) STS 5# goblet hold from 26.5 inch mat table x10 cues to breathe out     TODAY'S TREATMENT:                                                           DATE:  04/22/23 FOTO, objective measures as above, goal review and education on progress/POC  TherEx Nustep L8 x12 minutes BLEs only Shuttle LE press 112# 2x15 double leg Shuttle  LE press 43# 2x15 single leg   PATIENT EDUCATION: 03/09/2023 Education details: HEP, PT plan of care Person educated: Patient Education method: Explanation, Demonstration, Verbal cues, and Handouts Education comprehension: verbalized understanding and needs further education   HOME EXERCISE PROGRAM: Access Code: [facility] URL: [email or web address] Date: 03/19/2023 Prepared by: Grayce Spatz  Exercises - Supine Quad Set  - 2 x daily - 6 x weekly - 2-3 sets - 10 reps - 5 sec hold - Supine Heel Slide with Strap  - 2 x daily - 6 x weekly - 2-3 sets - 10 reps - 5 hold - straight leg raise with help from strap  - 2 x daily - 6 x weekly - 1-2 sets - 10 reps - 10 seconds hold - Seated March  - 2 x daily - 6 x weekly - 2-3 sets - 10 reps - Seated Long Arc Quad  - 2 x daily - 6 x weekly - 2-3 sets - 10 reps - 3 hold - Long Sitting Quad Set with Towel Roll Under Heel  - 5 x daily - 7 x weekly - 2 sets - 10 reps - 5 seconds hold - Prone Knee Extension with Ankle Weight  - 2-3 x daily - 7 x weekly - 1 sets - 2-3 reps - 2 minutes hold - Prone Knee Flexion  - 2-3 x daily - 7 x weekly - 2-3 sets - 10 reps - 5 seconds hold - Prone Knee Extension Overpressure  - 2-3 x daily - 7 x weekly - 2-3 sets - 10 reps - 5 seconds hold   ASSESSMENT:  CLINICAL IMPRESSION:   Pt has continued to show good  commitment and effort in strengthening plan at home and in clinic and it has started to show good benefit in progressive mobility.  Continued strengthening will continue to help improve.  Medical necessity for continued treatment indicated.    OBJECTIVE IMPAIRMENTS: decreased activity tolerance, difficulty walking, decreased balance, decreased endurance, decreased mobility, decreased ROM, decreased strength, impaired flexibility, impaired LE use, and pain.  ACTIVITY LIMITATIONS: bending, lifting, carry, locomotion, cleaning, community activity, driving, and  occupation  PERSONAL FACTORS: see above PMH are also affecting patient's functional outcome.  REHAB POTENTIAL: Good  CLINICAL DECISION MAKING: Stable/uncomplicated  EVALUATION COMPLEXITY: Low    GOALS: Short term PT Goals Target date: 05/20/2023     Pt will be I and compliant with HEP. Baseline:  Goal status: MET 04/22/23   Long term PT goals 06/17/2023     Pt will improve Rt knee ROM to [facility] (3-115 deg) to improve functional mobility Baseline: Goal status: ONGOING 04/22/23  Pt will improve Rt hip/knee strength to at least 4+/5 MMT to improve functional strength Baseline: Goal status: ONGOING 04/22/23  Pt will improve FOTO to at least 71% functional to show improved function Baseline: Goal status: ONGOING 04/22/23  Pt will reduce pain to overall less than 2-3/10 with usual activity and feel ready to return to work activity. Baseline: Goal status: PARTIALLY MET 04/22/23- has met goal with exception of when LE is in externally rotated position at night and when exerting on LE press   Pt will be able to ambulate community distances at least 1000 ft WNL gait pattern without AD Baseline: Goal status: ONGOING 04/22/23  PLAN: PT FREQUENCY: 2x/week   PT DURATION: 8 weeks   PLANNED INTERVENTIONS (unless contraindicated): aquatic PT, Canalith repositioning, cryotherapy, Electrical stimulation, Iontophoresis with 4 mg/ml  dexamethasome, Moist heat, traction, Ultrasound, gait training, Therapeutic exercise, balance  training, neuromuscular re-education, patient/family education, prosthetic training, manual techniques, passive ROM, dry needling, taping, vasopnuematic device, vestibular, spinal manipulations, joint manipulations [id number]-Therapeutic exercises, 97530- Therapeutic activity, 97112- Neuromuscular re-education, 97535- Self Care, and [id number]- Manual therapy  PLAN FOR NEXT SESSION: WB strengthening, progressive resistance exercise.   Had ablation 04/30/23.      Ozell Silvan, PT, DPT, OCS, ATC 05/14/23  10:47 AM

## 2023-05-18 ENCOUNTER — Encounter: Payer: Self-pay | Admitting: Rehabilitative and Restorative Service Providers"

## 2023-05-18 ENCOUNTER — Ambulatory Visit (INDEPENDENT_AMBULATORY_CARE_PROVIDER_SITE_OTHER): Payer: BC Managed Care – PPO | Admitting: Rehabilitative and Restorative Service Providers"

## 2023-05-18 DIAGNOSIS — M6281 Muscle weakness (generalized): Secondary | ICD-10-CM | POA: Diagnosis not present

## 2023-05-18 DIAGNOSIS — R2689 Other abnormalities of gait and mobility: Secondary | ICD-10-CM

## 2023-05-18 DIAGNOSIS — R600 Localized edema: Secondary | ICD-10-CM

## 2023-05-18 DIAGNOSIS — M25662 Stiffness of left knee, not elsewhere classified: Secondary | ICD-10-CM

## 2023-05-18 NOTE — Therapy (Addendum)
 OUTPATIENT PHYSICAL THERAPY TREATMENT  Patient Name: Johnny [name] MRN: [id number] DOB:02-01-1956, 68 y.o., male Today's Date: 05/18/2023  END OF SESSION:  PT End of Session - 05/18/23 1009     Visit Number 18    Number of Visits 30    Date for PT Re-Evaluation 06/17/23    Authorization Type [employer] 20% coinsurance    Authorization Time Period recert 04/22/23 to 06/17/23    Progress Note Due on Visit 24    PT Start Time 1010    PT Stop Time 1050    PT Time Calculation (min) 40 min    Activity Tolerance Patient tolerated treatment well    Behavior During Therapy [facility] for tasks assessed/performed               Past Medical History:  Diagnosis Date   Arthritis    CKD (chronic kidney disease) stage 3, GFR 30-59 ml/min (HCC) 06/10/2017   Constipation 03/27/2020   Diabetes mellitus    Dysrhythmia    A flutter   Essential hypertension 08/03/2007   Qualifier: Diagnosis of  By: Harlow MD, Ozell BRAVO  Med ARB, diuretic     Guttate psoriasis    Hyperlipidemia associated with type 2 diabetes mellitus (HCC) 08/03/2007   Qualifier: Diagnosis of  By: Harlow MD, Ozell BRAVO  meds - none    Paroxysmal atrial flutter (HCC)    S/P TEE/DCCV 07/2019   PSORIASIS, 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   Qualifier: Diagnosis of  By: Harlow MD, Ozell BRAVO    Sleep apnea    Type II diabetes mellitus with renal manifestations, uncontrolled 08/03/2007   Qualifier: Diagnosis of  By: Harlow MD, Ozell BRAVO  Med metformin     Past Surgical History:  Procedure Laterality Date   A-FLUTTER ABLATION N/A 04/30/2023   Procedure: A-FLUTTER ABLATION;  Surgeon: Kennyth Chew, MD;  Location: [facility] INVASIVE CV LAB;  Service: Cardiovascular;  Laterality: N/A;   CARDIOVERSION N/A 07/18/2019   Procedure: CARDIOVERSION;  Surgeon: Maranda Leim VEAR, MD;  Location: [facility] ENDOSCOPY;  Service: Cardiovascular;  Laterality: N/A;   IRRIGATION AND DEBRIDEMENT KNEE Right 02/18/2023   Procedure: RIGHT KNEE ARTHROSCOPIC IRRIGATION AND DEBRIDEMENT;   Surgeon: Jerri Kay HERO, MD;  Location: [facility] ORS;  Service: Orthopedics;  Laterality: Right;   LOOP RECORDER INSERTION N/A 04/30/2023   Procedure: LOOP RECORDER INSERTION;  Surgeon: Kennyth Chew, MD;  Location: [facility] INVASIVE CV LAB;  Service: Cardiovascular;  Laterality: N/A;   TEE WITHOUT CARDIOVERSION N/A 07/18/2019   Procedure: TRANSESOPHAGEAL ECHOCARDIOGRAM (TEE);  Surgeon: Maranda Leim VEAR, MD;  Location: [facility] ENDOSCOPY;  Service: Cardiovascular;  Laterality: N/A;   TOOTH EXTRACTION     XI ROBOTIC ASSISTED SIMPLE PROSTATECTOMY N/A 05/31/2020   Procedure: XI ROBOTIC ASSISTED SIMPLE PROSTATECTOMY-SUPRAPUBIC;  Surgeon: Sherrilee Belvie CROME, MD;  Location: [facility] ORS;  Service: Urology;  Laterality: N/A;   Patient Active Problem List   Diagnosis Date Noted   Rheumatoid factor positive 02/10/2023   Knee pain, right 01/21/2023   Hyperlipidemia 01/20/2023   Effusion, right knee 01/20/2023   Hypercoagulable state due to typical atrial flutter (HCC) 01/13/2023   Venous stasis of lower extremity 01/09/2023   BPH with obstruction/lower urinary tract symptoms 05/31/2020   Paroxysmal atrial flutter (HCC)    Adrenal nodule (HCC) 03/28/2020   OSA (obstructive sleep apnea) 03/28/2020   Morbid obesity (HCC) 03/28/2020   Medication noncompliance due to cognitive impairment 03/28/2020   Chronic systolic CHF (congestive heart failure) (HCC) 03/28/2020   CKD (chronic kidney disease) stage 4, GFR 15-29  ml/min (HCC) 06/10/2017   PSORIASIS, 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   Type 2 diabetes with complication (HCC) 08/03/2007   Hyperlipidemia associated with type 2 diabetes mellitus (HCC) 08/03/2007   Essential hypertension 08/03/2007    PCP: Rollene Almarie LABOR, MD  REFERRING PROVIDER: Jerri Kay HERO, MD   REFERRING DIAG: [phone] (ICD-10-CM) - Effusion, right knee   THERAPY DIAG:  Muscle weakness (generalized)  Stiffness of left knee, not elsewhere classified  Other abnormalities of gait and mobility  Localized  edema  Rationale for Evaluation and Treatment: Rehabilitation  ONSET DATE: Right knee scope, irrigation and debriement, major synovectomy 02/18/2023  SUBJECTIVE:   SUBJECTIVE STATEMENT: Pt indicated no pain troubles.  Walking more independent.     PERTINENT HISTORY: Right knee scope 02/18/2023 PMH:RA,OSA,Obesity,CHF,CKD,DM2 To wear knee immobilizer until quad function improves  PAIN:  NPRS scale:   0/10  Pain location:  Pain description:  Aggravating factors:  Relieving factors:    PRECAUTIONS: None  RED FLAGS: None   WEIGHT BEARING RESTRICTIONS: No  FALLS:  Has patient fallen in last 6 months? 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  DIAGNOSTIC FINDINGS:  03/09/2023 review:  XR 01/05/23 IMPRESSION: Mild osteoarthritis with moderate knee joint effusion.  PATIENT SURVEYS:  03/09/2023 FOTO 57% functional goal is 71%; 04/22/23- 55%   COGNITION: 03/09/2023 Overall cognitive status: Within functional limits for tasks assessed      EDEMA:  03/09/2023 Moderate edema noted in Rt knee and Lower leg   LOWER EXTREMITY ROM:   AROM/PROM Right eval Right 03/13/2023 Right 03/19/23 Right 04/22/23  Hip flexion      Hip extension      Hip abduction      Hip adduction      Hip internal rotation      Hip external rotation      Knee flexion 60/80 Passive 97  Supine AROM 107*  Knee extension 15/10 Passive lacks 10 Supine P: -8* A: quad set -10* Supine AROM -9*  Ankle dorsiflexion      Ankle plantarflexion      Ankle inversion      Ankle eversion       (Blank rows = not tested)  LOWER EXTREMITY MMT:  MMT Right eval Left 04/06/23 Right 04/13/2023 Left 04/13/2023 Right 04/22/23 Left 04/22/23  Right 05/12/2023  Hip flexion 2        Hip extension         Hip abduction         Hip adduction         Hip internal rotation         Hip external rotation         Knee flexion 3 4   4 5    Knee extension 2 3 13.3, 12.7 lbs 5/5 93.6 lbs 3- 5 4/5 30.3, 33 lbs  Ankle dorsiflexion         Ankle plantarflexion         Ankle inversion         Ankle eversion          (Blank rows = not tested)  GAIT: 04/16/2023: SPC use in Rt UE per Pt preference despite encouragement for use in Lt arm.   04/01/2023:   FWW or crutches reported.  FWW into clinc.   03/09/2023 Comments: Currently using RW and in knee immobilizer druing ambulation. He did not use AD PLOF                   TODAY'S TREATMENT:                                                           DATE:   05/18/2023 Therex: Nustep lvl 7 LE only 12 mins TRX squat over chair x 15  Knee extension machine double leg up, Rt leg lowering slowly x 8 10 lbs, 2 x 15 5 lbs     Neuro Re-ed Tandem ambulation fwd/back 6 ft on foam x 6 each way with occasional HHA on bar  TherActivity Forward step up 6 inch step no UE assist Rt leg WB x 15  Step on over and down 4 inch step WB on Rt leg x 15 with single hand assist  TODAY'S TREATMENT:                                                           DATE:   05/14/2023 Therex: Nustep lvl 6 LE only 12 mins Knee extension machine double leg up, Rt leg lowering slowly 3 x 15 5 lbs  Machine knee flexion Rt leg only 2 x 15 20 lbs    Neuro Re-ed SLS on black mat with contralateral leg corner touching x8 each , performed bilaterally  Tandem ambulation fwd/back 6 ft on foam x 6 each way with occasional HHA on bar  TherActivity Leg press  single leg Rt 50 lbs 2 x 20 Lateral step up/down WB on Rt leg x 10 Step on over and down 4 inch step WB on Rt leg x 10 with single hand assist  TODAY'S TREATMENT:                                                           DATE:   05/12/2023 Therex: Nustep lvl 6 LE only 10  mins Knee extension machine double leg up, Rt leg lowering slowly 2 x 15 5 lbs  Seated quad set 5 sec hold x 5 Rt  Seated quad set with SLR Rt x 15 Verbal reviews of strengthening in gym based equipment.   TherActivity Leg press double leg 125 lbs x 20, single leg Rt 43 lbs 2 x 20 Sit to stand to sit 25 inch table 10 lb kettle bell with slow lowering focus 2  x 10     TODAY'S TREATMENT:                                                           DATE:   05/08/2023 TherEx Nustep L5 x8 minutes BLEs  only (HR 63 prior to exercise, 63 at end of 8 minutes) LAQ reverse ladder: 5# 2x10 cues not to extend trunk, 8# 2x7, 10# 2x5 (HR stable in 60s, RPE 10/10 as weight on quads increased) Seated SLRs 2# x12 (RPE 8/10, HR 71) STS 5# goblet hold from 26.5 inch mat table x10 cues to breathe out    PATIENT EDUCATION: 03/09/2023 Education details: HEP, PT plan of care Person educated: Patient Education method: Explanation, Demonstration, Verbal cues, and Handouts Education comprehension: verbalized understanding and needs further education   HOME EXERCISE PROGRAM: Access Code: [facility] URL: [email or web address] Date: 03/19/2023 Prepared by: Grayce Spatz  Exercises - Supine Quad Set  - 2 x daily - 6 x weekly - 2-3 sets - 10 reps - 5 sec hold - Supine Heel Slide with Strap  - 2 x daily - 6 x weekly - 2-3 sets - 10 reps - 5 hold - straight leg raise with help from strap  - 2 x daily - 6 x weekly - 1-2 sets - 10 reps - 10 seconds hold - Seated March  - 2 x daily - 6 x weekly - 2-3 sets - 10 reps - Seated Long Arc Quad  - 2 x daily - 6 x weekly - 2-3 sets - 10 reps - 3 hold - Long Sitting Quad Set with Towel Roll Under Heel  - 5 x daily - 7 x weekly - 2 sets - 10 reps - 5 seconds hold - Prone Knee Extension with Ankle Weight  - 2-3 x daily - 7 x weekly - 1 sets - 2-3 reps - 2 minutes hold - Prone Knee Flexion  - 2-3 x daily - 7 x weekly - 2-3 sets - 10 reps - 5 seconds hold - Prone  Knee Extension Overpressure  - 2-3 x daily - 7 x weekly - 2-3 sets - 10 reps - 5 seconds hold   ASSESSMENT:  CLINICAL IMPRESSION:   Independent ambulation into clinic today.  Pt continued to show good progress in functional strength gains.  WB loading on /off step for Rt leg was still impacted by strength deficits.  Continued strengthening to improve functional movement.  Medical necessity for continued skilled PT serviced indicated.    OBJECTIVE IMPAIRMENTS: decreased activity tolerance, difficulty walking, decreased balance, decreased endurance, decreased mobility, decreased ROM, decreased strength, impaired flexibility, impaired LE use, and pain.  ACTIVITY LIMITATIONS: bending, lifting, carry, locomotion, cleaning, community activity, driving, and  occupation  PERSONAL FACTORS: see above PMH are also affecting patient's functional outcome.  REHAB POTENTIAL: Good  CLINICAL DECISION MAKING: Stable/uncomplicated  EVALUATION COMPLEXITY: Low    GOALS: Short term PT Goals Target date: 05/20/2023     Pt will be I and compliant with HEP. Baseline:  Goal status: MET 04/22/23   Long term PT goals 06/17/2023     Pt will improve Rt knee ROM to [facility] (3-115 deg) to improve functional mobility Baseline: Goal status: ONGOING 04/22/23  Pt will improve Rt hip/knee strength to at least 4+/5 MMT to improve functional strength Baseline: Goal status: ONGOING 04/22/23  Pt will improve FOTO to at least 71% functional to show improved function Baseline: Goal status: ONGOING 04/22/23  Pt will reduce pain to overall less than 2-3/10 with usual activity and feel ready to return to work activity. Baseline: Goal status: PARTIALLY MET 04/22/23- has met goal with exception of when LE is in externally rotated position at night and when exerting on LE press   Pt will be  able to ambulate community distances at least 1000 ft WNL gait pattern without AD Baseline: Goal status: ONGOING  04/22/23  PLAN: PT FREQUENCY: 2x/week   PT DURATION: 8 weeks   PLANNED INTERVENTIONS (unless contraindicated): aquatic PT, Canalith repositioning, cryotherapy, Electrical stimulation, Iontophoresis with 4 mg/ml dexamethasome, Moist heat, traction, Ultrasound, gait training, Therapeutic exercise, balance training, neuromuscular re-education, patient/family education, prosthetic training, manual techniques, passive ROM, dry needling, taping, vasopnuematic device, vestibular, spinal manipulations, joint manipulations 97110-Therapeutic exercises, 97530- Therapeutic activity, 97112- Neuromuscular re-education, 97535- Self Care, and [id number]- Manual therapy  PLAN FOR NEXT SESSION:  Continue strengthening.   Had ablation 04/30/23.      Ozell Silvan, PT, DPT, OCS, ATC 05/18/23  10:51 AM

## 2023-05-20 ENCOUNTER — Encounter: Payer: Self-pay | Admitting: Podiatry

## 2023-05-20 ENCOUNTER — Ambulatory Visit (INDEPENDENT_AMBULATORY_CARE_PROVIDER_SITE_OTHER): Payer: BC Managed Care – PPO | Admitting: Podiatry

## 2023-05-20 DIAGNOSIS — B351 Tinea unguium: Secondary | ICD-10-CM

## 2023-05-20 DIAGNOSIS — E1142 Type 2 diabetes mellitus with diabetic polyneuropathy: Secondary | ICD-10-CM

## 2023-05-20 DIAGNOSIS — M79675 Pain in left toe(s): Secondary | ICD-10-CM

## 2023-05-20 DIAGNOSIS — M79674 Pain in right toe(s): Secondary | ICD-10-CM | POA: Diagnosis not present

## 2023-05-20 NOTE — Progress Notes (Signed)
  Subjective:  Patient ID: Johnny [name], male    DOB: 1956/03/22,  MRN: [id number]  Chief Complaint  Patient presents with   [facility]    RM#11 [facility] patient has no other concerns at this time.   68 y.o. male returns for the above complaint.  Patient presents with thickened elongated dystrophic toenails x10.  Patient is a painful to walk on.  Patient would like to have them debrided down as he is not able to do it himself.  Patient is a diabetic with last A1c of 7.6.  He does not have any secondary complaints.  Objective:  There were no vitals filed for this visit. Podiatric Exam: Vascular: dorsalis pedis and posterior tibial pulses are palpable bilateral. Capillary return is immediate. Temperature gradient is WNL. Skin turgor WNL  Sensorium: Normal Semmes Weinstein monofilament test. Normal tactile sensation bilaterally. Nail Exam: Pt has thick disfigured discolored nails with subungual debris noted bilateral entire nail hallux through fifth toenails.  Pain on palpation to the nails. Ulcer Exam: There is no evidence of ulcer or pre-ulcerative changes or infection. Orthopedic Exam: Muscle tone and strength are WNL. No limitations in general ROM. No crepitus or effusions noted. HAV  B/L.  Hammer toes 2-5  B/L. Skin: No Porokeratosis. No infection or ulcers.  Bilateral severe xerosis noted to bilateral lower extremity    Assessment & Plan:   No diagnosis found.        Patient was evaluated and treated and all questions answered.  Xerosis skin severe bilateral lower extremity -Clinically improved   Onychomycosis with pain  -Nails palliatively debrided as below. -Educated on self-care  Procedure: Nail Debridement Rationale: pain  Type of Debridement: manual, sharp debridement. Instrumentation: Nail nipper, rotary burr. Number of Nails: 10  Procedures and Treatment: Consent by patient was obtained for treatment procedures. The patient understood the discussion of treatment and  procedures well. All questions were answered thoroughly reviewed. Debridement of mycotic and hypertrophic toenails, 1 through 5 bilateral and clearing of subungual debris. No ulceration, no infection noted.  Return Visit-Office Procedure: Patient instructed to return to the office for a follow up visit 3 months for continued evaluation and treatment.  Nicholes Rough, DPM    No follow-ups on file.

## 2023-05-21 ENCOUNTER — Ambulatory Visit: Payer: BC Managed Care – PPO | Admitting: Rehabilitative and Restorative Service Providers"

## 2023-05-21 ENCOUNTER — Encounter: Payer: Self-pay | Admitting: Rehabilitative and Restorative Service Providers"

## 2023-05-21 DIAGNOSIS — R600 Localized edema: Secondary | ICD-10-CM

## 2023-05-21 DIAGNOSIS — M25662 Stiffness of left knee, not elsewhere classified: Secondary | ICD-10-CM | POA: Diagnosis not present

## 2023-05-21 DIAGNOSIS — R2689 Other abnormalities of gait and mobility: Secondary | ICD-10-CM | POA: Diagnosis not present

## 2023-05-21 DIAGNOSIS — M6281 Muscle weakness (generalized): Secondary | ICD-10-CM

## 2023-05-21 NOTE — Therapy (Signed)
OUTPATIENT PHYSICAL THERAPY TREATMENT  Patient Name: Johnny [name] MRN: [id number] DOB:14-May-1955, 68 y.o., male Today's Date: 05/21/2023  END OF SESSION:  PT End of Session - 05/21/23 1018     Visit Number 19    Number of Visits 30    Date for PT Re-Evaluation 06/17/23    Authorization Type [employer] 20% coinsurance    Authorization Time Period recert 04/22/23 to 06/17/23    Progress Note Due on Visit 24    PT Start Time 1013    PT Stop Time 1052    PT Time Calculation (min) 39 min    Activity Tolerance Patient tolerated treatment well    Behavior During Therapy [facility] for tasks assessed/performed                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   Qualifier: Diagnosis of  By: Debby Bud MD, Rosalyn Gess    Sleep apnea    Type II diabetes mellitus with renal manifestations, uncontrolled 08/03/2007   Qualifier: Diagnosis of  By: Debby Bud MD, Rosalyn Gess  Med metformin    Past Surgical History:  Procedure Laterality Date   A-FLUTTER ABLATION N/A 04/30/2023   Procedure: A-FLUTTER ABLATION;  Surgeon: Nobie Putnam, MD;  Location: [facility] INVASIVE CV LAB;  Service: Cardiovascular;  Laterality: N/A;   CARDIOVERSION N/A 07/18/2019   Procedure: CARDIOVERSION;  Surgeon: Lars Masson, MD;  Location: [facility] ENDOSCOPY;  Service: Cardiovascular;  Laterality: N/A;   IRRIGATION AND DEBRIDEMENT KNEE Right 02/18/2023   Procedure: RIGHT KNEE ARTHROSCOPIC IRRIGATION AND  DEBRIDEMENT;  Surgeon: Tarry Kos, MD;  Location: [facility] ORS;  Service: Orthopedics;  Laterality: Right;   LOOP RECORDER INSERTION N/A 04/30/2023   Procedure: LOOP RECORDER INSERTION;  Surgeon: Nobie Putnam, MD;  Location: [facility] INVASIVE CV LAB;  Service: Cardiovascular;  Laterality: N/A;   TEE WITHOUT CARDIOVERSION N/A 07/18/2019   Procedure: TRANSESOPHAGEAL ECHOCARDIOGRAM (TEE);  Surgeon: Lars Masson, MD;  Location: [facility] ENDOSCOPY;  Service: Cardiovascular;  Laterality: N/A;   TOOTH EXTRACTION     XI ROBOTIC ASSISTED SIMPLE PROSTATECTOMY N/A 05/31/2020   Procedure: XI ROBOTIC ASSISTED SIMPLE PROSTATECTOMY-SUPRAPUBIC;  Surgeon: Malen Gauze, MD;  Location: [facility] ORS;  Service: Urology;  Laterality: N/A;   Patient Active Problem List   Diagnosis Date Noted   Rheumatoid factor positive 02/10/2023   Knee pain, right 01/21/2023   Hyperlipidemia 01/20/2023   Effusion, right knee 01/20/2023   Hypercoagulable state due to typical atrial flutter (HCC) 01/13/2023   Venous stasis of lower extremity 01/09/2023   BPH with obstruction/lower urinary tract symptoms 05/31/2020   Paroxysmal atrial flutter (HCC)    Adrenal nodule (HCC) 03/28/2020   OSA (obstructive sleep apnea) 03/28/2020   Morbid obesity (HCC) 03/28/2020   Medication noncompliance due to cognitive impairment 03/28/2020   Chronic systolic CHF (congestive heart failure) (HCC) 03/28/2020   CKD (chronic kidney disease) stage 4, GFR  15-29 ml/min (HCC) 06/10/2017   PSORIASIS, 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   Type 2 diabetes with complication (HCC) 08/03/2007   Hyperlipidemia associated with type 2 diabetes mellitus (HCC) 08/03/2007   Essential hypertension 08/03/2007    PCP: Myrlene Broker, MD  REFERRING PROVIDER: Tarry Kos, MD   REFERRING DIAG: [phone] (ICD-10-CM) - Effusion, right knee   THERAPY DIAG:  Muscle weakness (generalized)  Stiffness of left knee, not elsewhere classified  Other abnormalities of gait and  mobility  Localized edema  Rationale for Evaluation and Treatment: Rehabilitation  ONSET DATE: Right knee scope, irrigation and debriement, major synovectomy 02/18/2023  SUBJECTIVE:   SUBJECTIVE STATEMENT: Pt indicated feeling some stiffness with sitting prolonged but otherwise continued to do better.   Pt indicated overall improvement overall at 80% to normal.    PERTINENT HISTORY: Right knee scope 02/18/2023 PMH:RA,OSA,Obesity,CHF,CKD,DM2 To wear knee immobilizer until quad function improves  PAIN:  NPRS scale:   0/10 Pain location:  Pain description:  Aggravating factors:  Relieving factors:    PRECAUTIONS: None  RED FLAGS: None   WEIGHT BEARING RESTRICTIONS: No  FALLS:  Has patient fallen in last 6 months? 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  DIAGNOSTIC FINDINGS:  03/09/2023 review:  XR 01/05/23 IMPRESSION: Mild osteoarthritis with moderate knee joint effusion.  PATIENT SURVEYS:  03/09/2023 FOTO 57% functional goal is 71%; 04/22/23- 55%   COGNITION: 03/09/2023 Overall cognitive status: Within functional limits for tasks assessed      EDEMA:  03/09/2023 Moderate edema noted in Rt knee and Lower leg   LOWER EXTREMITY ROM:   AROM/PROM Right eval Right 03/13/2023 Right 03/19/23 Right 04/22/23  Hip flexion      Hip extension      Hip abduction      Hip adduction      Hip internal rotation      Hip external rotation      Knee flexion 60/80 Passive 97  Supine AROM 107*  Knee extension 15/10 Passive lacks 10 Supine P: -8* A: quad set -10* Supine AROM -9*  Ankle dorsiflexion      Ankle plantarflexion      Ankle inversion      Ankle eversion       (Blank rows = not  tested)  LOWER EXTREMITY MMT:  MMT Right eval Left 04/06/23 Right 04/13/2023 Left 04/13/2023 Right 04/22/23 Left 04/22/23 Right 05/12/2023  Hip flexion 2        Hip extension         Hip abduction         Hip adduction         Hip internal rotation         Hip external rotation         Knee flexion 3 4   4 5    Knee extension 2 3 13.3, 12.7 lbs 5/5 93.6 lbs 3- 5 4/5 30.3, 33 lbs  Ankle dorsiflexion         Ankle plantarflexion         Ankle inversion         Ankle eversion          (Blank rows = not tested)  GAIT: 05/21/2023:  Independent ambulation in clinic without noted deviation upon arrival  today.   04/16/2023: SPC use in Rt UE per Pt preference despite encouragement for use in Lt arm.   04/01/2023:   FWW or crutches reported.   FWW into clinc.   03/09/2023 Comments: Currently using RW and in knee immobilizer druing ambulation. He did not use AD PLOF                   TODAY'S TREATMENT:                                                           DATE:   05/21/2023 Therex: Nustep lvl 7 LE only 10 mins Incline gastroc stretch 30 sec x 4 bilateral Knee extension machine double leg up, Rt leg lowering slowly 2 x 10 lbs, 2 x 15 5 lbs   Knee flexion machine Rt SL 2 x 15 25 lbs   Neuro Re-ed SLS with contralateral leg hurdle step over clear 9 inch step x 15 bilaterally with occasional HHA SLS on foam with corner touching with contralateral leg with occasional HHA, all corners x 8 each side  TherActivity Step on over and down 4 inch step WB on Rt leg x 10 with single hand assist, 6 inch step x 10  Flight of stairs up/down reciprocal gait pattern with single handrail assist  TODAY'S TREATMENT:                                                           DATE:   05/18/2023 Therex: Nustep lvl 7 LE only 12 mins TRX squat over chair x 15  Knee extension machine double leg up, Rt leg lowering slowly x 8 10 lbs, 2 x 15 5 lbs     Neuro Re-ed Tandem ambulation fwd/back 6 ft on foam  x 6 each way with occasional HHA on bar  TherActivity Forward step up 6 inch step no UE assist Rt leg WB x 15  Step on over and down 4 inch step WB on Rt leg x 15 with single hand assist  TODAY'S TREATMENT:                                                           DATE:   05/14/2023 Therex: Nustep lvl 6 LE only 12 mins Knee extension machine double leg up, Rt leg lowering slowly 3 x 15 5 lbs  Machine knee flexion Rt leg only 2 x 15 20 lbs    Neuro Re-ed SLS on black mat with contralateral leg corner touching x8 each , performed bilaterally  Tandem ambulation fwd/back 6 ft on foam x 6 each way with occasional HHA on bar  TherActivity Leg press  single leg Rt 50 lbs 2 x 20 Lateral step up/down WB on Rt leg x 10 Step on over and down 4 inch step WB on Rt leg x 10 with single hand assist  TODAY'S TREATMENT:  DATE:   05/12/2023 Therex: Nustep lvl 6 LE only 10 mins Knee extension machine double leg up, Rt leg lowering slowly 2 x 15 5 lbs  Seated quad set 5 sec hold x 5 Rt  Seated quad set with SLR Rt x 15 Verbal reviews of strengthening in gym based equipment.   TherActivity Leg press double leg 125 lbs x 20, single leg Rt 43 lbs 2 x 20 Sit to stand to sit 25 inch table 10 lb kettle bell with slow lowering focus 2  x 10    PATIENT EDUCATION: 03/09/2023 Education details: HEP, PT plan of care Person educated: Patient Education method: Explanation, Demonstration, Verbal cues, and Handouts Education comprehension: verbalized understanding and needs further education   HOME EXERCISE PROGRAM: Access Code: [facility] URL: [email or web address] Date: 03/19/2023 Prepared by: [name] Faster  Exercises - Supine Quad Set  - 2 x daily - 6 x weekly - 2-3 sets - 10 reps - 5 sec hold - Supine Heel Slide with Strap  - 2 x daily - 6 x weekly - 2-3 sets - 10 reps - 5 hold - straight leg raise with help from strap  - 2 x daily  - 6 x weekly - 1-2 sets - 10 reps - 10 seconds hold - Seated March  - 2 x daily - 6 x weekly - 2-3 sets - 10 reps - Seated Long Arc Quad  - 2 x daily - 6 x weekly - 2-3 sets - 10 reps - 3 hold - Long Sitting Quad Set with Towel Roll Under Heel  - 5 x daily - 7 x weekly - 2 sets - 10 reps - 5 seconds hold - Prone Knee Extension with Ankle Weight  - 2-3 x daily - 7 x weekly - 1 sets - 2-3 reps - 2 minutes hold - Prone Knee Flexion  - 2-3 x daily - 7 x weekly - 2-3 sets - 10 reps - 5 seconds hold - Prone Knee Extension Overpressure  - 2-3 x daily - 7 x weekly - 2-3 sets - 10 reps - 5 seconds hold   ASSESSMENT:  CLINICAL IMPRESSION:   Pt has continued to show and report improvement in functional ambulation and movement with minimal symptoms.  At this time, trial HEP was agreed upon for several weeks.  Will return and plan to recheck data for MD visit in Feb.   OBJECTIVE IMPAIRMENTS: decreased activity tolerance, difficulty walking, decreased balance, decreased endurance, decreased mobility, decreased ROM, decreased strength, impaired flexibility, impaired LE use, and pain.  ACTIVITY LIMITATIONS: bending, lifting, carry, locomotion, cleaning, community activity, driving, and  occupation  PERSONAL FACTORS: see above PMH are also affecting patient's functional outcome.  REHAB POTENTIAL: Good  CLINICAL DECISION MAKING: Stable/uncomplicated  EVALUATION COMPLEXITY: Low    GOALS: Short term PT Goals Target date: 05/20/2023     Pt will be I and compliant with HEP. Baseline:  Goal status: MET 04/22/23   Long term PT goals 06/17/2023     Pt will improve Rt knee ROM to [facility] (3-115 deg) to improve functional mobility Baseline: Goal status: ONGOING 04/22/23  Pt will improve Rt hip/knee strength to at least 4+/5 MMT to improve functional strength Baseline: Goal status: ONGOING 04/22/23  Pt will improve FOTO to at least 71% functional to show improved function Baseline: Goal status:  ONGOING 04/22/23  Pt will reduce pain to overall less than 2-3/10 with usual activity and feel ready to return to work activity. Baseline: Goal status: PARTIALLY  MET 04/22/23- has met goal with exception of when LE is in externally rotated position at night and when exerting on LE press   Pt will be able to ambulate community distances at least 1000 ft WNL gait pattern without AD Baseline: Goal status: ONGOING 04/22/23  PLAN: PT FREQUENCY: 2x/week   PT DURATION: 8 weeks   PLANNED INTERVENTIONS (unless contraindicated): aquatic PT, Canalith repositioning, cryotherapy, Electrical stimulation, Iontophoresis with 4 mg/ml dexamethasome, Moist heat, traction, Ultrasound, gait training, Therapeutic exercise, balance training, neuromuscular re-education, patient/family education, prosthetic training, manual techniques, passive ROM, dry needling, taping, vasopnuematic device, vestibular, spinal manipulations, joint manipulations 97110-Therapeutic exercises, 97530- Therapeutic activity, 97112- Neuromuscular re-education, 97535- Self Care, and 95638- Manual therapy  PLAN FOR NEXT SESSION:  MD update note.   Had ablation 04/30/23.     Chyrel Masson, PT, DPT, OCS, ATC 05/21/23  10:51 AM

## 2023-05-25 ENCOUNTER — Ambulatory Visit: Payer: BC Managed Care – PPO | Admitting: Urology

## 2023-05-26 ENCOUNTER — Encounter: Payer: BC Managed Care – PPO | Admitting: Rehabilitative and Restorative Service Providers"

## 2023-05-28 ENCOUNTER — Encounter: Payer: BC Managed Care – PPO | Admitting: Rehabilitative and Restorative Service Providers"

## 2023-05-28 NOTE — Progress Notes (Signed)
   Electrophysiology Office Note:   Date:  05/29/2023  ID:  CAMBRIDGE DELEO, DOB 1956-01-14, MRN [id number]  Primary Cardiologist: Armanda Magic, MD Electrophysiologist: Nobie Putnam, MD      History of Present Illness:   Johnny [name] is a 68 y.o. male with h/o CKD stage 4, DM, HTN, psoriasis, aortic atherosclerosis, OSA and arrhythmia induced cardiomyopath  seen today for routine electrophysiology followup.   Since last being seen in our clinic the patient reports doing very well. Has not felt any AF, and at least as of 1/18 has not had any on device. He denies chest pain, palpitations, dyspnea, PND, orthopnea, nausea, vomiting, dizziness, syncope, edema, weight gain, or early satiety.     Review of systems complete and found to be negative unless listed in HPI.   Studies Reviewed:    EKG is not ordered today. EKG from 12/26 reviewed which showed NSR at 68 bpm  Loop recorder   Arrhythmia/Device History S/p AFL ablation 04/30/2023 BSX loop implanted 04/2023 for AFL monitoring   Physical Exam:   VS:  BP (!) 146/88   Pulse 65   Ht 6\' 5"  (1.956 m)   Wt 284 lb 3.2 oz (128.9 kg)   SpO2 98%   BMI 33.70 kg/m    Wt Readings from Last 3 Encounters:  05/29/23 284 lb 3.2 oz (128.9 kg)  04/30/23 275 lb (124.7 kg)  03/24/23 274 lb (124.3 kg)     GEN: No acute distress NECK: No JVD; No carotid bruits CARDIAC: Regular rate and rhythm, no murmurs, rubs, gallops RESPIRATORY:  Clear to auscultation without rales, wheezing or rhonchi  ABDOMEN: Soft, non-tender, non-distended EXTREMITIES:  No edema; No deformity   ILR Interrogation- reviewed in detail today,  See PACEART report  ASSESSMENT AND PLAN:    Atrial flutter  s/p [employer] Loop recorder None by symptoms or monitor since ablation 04/2023 Normal device function See Arita Miss Art report No changes today Will continue Eliquis for now, follow up with MD in 2-3 months.   Secondary hypercoagulable state Pt on Eliquis as  above   HTN Stable on current regimen   OSA  Encouraged nightly CPAP   Follow up with Dr. Jimmey Ralph in 2-3 months.   Signed, Graciella Freer, PA-C

## 2023-05-29 ENCOUNTER — Encounter: Payer: Self-pay | Admitting: Student

## 2023-05-29 ENCOUNTER — Ambulatory Visit: Payer: BC Managed Care – PPO | Attending: Student | Admitting: Student

## 2023-05-29 VITALS — BP 146/88 | HR 65 | Ht 77.0 in | Wt 284.2 lb

## 2023-05-29 DIAGNOSIS — G4733 Obstructive sleep apnea (adult) (pediatric): Secondary | ICD-10-CM | POA: Diagnosis not present

## 2023-05-29 DIAGNOSIS — I10 Essential (primary) hypertension: Secondary | ICD-10-CM

## 2023-05-29 DIAGNOSIS — I4892 Unspecified atrial flutter: Secondary | ICD-10-CM | POA: Diagnosis not present

## 2023-05-29 DIAGNOSIS — D6869 Other thrombophilia: Secondary | ICD-10-CM | POA: Diagnosis not present

## 2023-05-29 DIAGNOSIS — I483 Typical atrial flutter: Secondary | ICD-10-CM

## 2023-05-29 NOTE — Patient Instructions (Signed)
Medication Instructions:  Your physician recommends that you continue on your current medications as directed. Please refer to the Current Medication list given to you today.  *If you need a refill on your cardiac medications before your next appointment, please call your pharmacy*  Lab Work: None ordered If you have labs (blood work) drawn today and your tests are completely normal, you will receive your results only by: MyChart Message (if you have MyChart) OR A paper copy in the mail If you have any lab test that is abnormal or we need to change your treatment, we will call you to review the results.  Follow-Up: At [facility], you and your health needs are our priority.  As part of our continuing mission to provide you with exceptional heart care, we have created designated Provider Care Teams.  These Care Teams include your primary Cardiologist (physician) and Advanced Practice Providers (APPs -  Physician Assistants and Nurse Practitioners) who all work together to provide you with the care you need, when you need it.  We recommend signing up for the patient portal called "MyChart".  Sign up information is provided on this After Visit Summary.  MyChart is used to connect with patients for Virtual Visits (Telemedicine).  Patients are able to view lab/test results, encounter notes, upcoming appointments, etc.  Non-urgent messages can be sent to your provider as well.   To learn more about what you can do with MyChart, go to [email or web address].    Your next appointment:   2-3 month(s)  Provider:   Nobie Putnam, MD

## 2023-06-01 ENCOUNTER — Ambulatory Visit: Payer: BC Managed Care – PPO

## 2023-06-01 DIAGNOSIS — I4892 Unspecified atrial flutter: Secondary | ICD-10-CM | POA: Diagnosis not present

## 2023-06-01 LAB — CUP PACEART REMOTE DEVICE CHECK
Date Time Interrogation Session: 20250127061500
Implantable Pulse Generator Implant Date: 20241226
Pulse Gen Serial Number: 128227

## 2023-06-09 ENCOUNTER — Other Ambulatory Visit: Payer: Self-pay | Admitting: Internal Medicine

## 2023-06-17 ENCOUNTER — Ambulatory Visit (INDEPENDENT_AMBULATORY_CARE_PROVIDER_SITE_OTHER): Payer: BC Managed Care – PPO | Admitting: Physical Therapy

## 2023-06-17 ENCOUNTER — Encounter: Payer: Self-pay | Admitting: Physical Therapy

## 2023-06-17 DIAGNOSIS — M25662 Stiffness of left knee, not elsewhere classified: Secondary | ICD-10-CM | POA: Diagnosis not present

## 2023-06-17 DIAGNOSIS — M6281 Muscle weakness (generalized): Secondary | ICD-10-CM | POA: Diagnosis not present

## 2023-06-17 DIAGNOSIS — R2689 Other abnormalities of gait and mobility: Secondary | ICD-10-CM

## 2023-06-17 NOTE — Therapy (Signed)
OUTPATIENT PHYSICAL THERAPY TREATMENT/DISCHARGE  PHYSICAL THERAPY DISCHARGE SUMMARY  Visits from Start of Care: 20  Current functional level related to goals / functional outcomes: See below   Remaining deficits: See below   Education / Equipment: HEP  Plan:  Patient goals were met so he will be discharged today.    Patient Name: Johnny [name] MRN: [id number] DOB:[birth year], 68 y.o., male Today's Date: 06/17/2023  END OF SESSION:  PT End of Session - 06/17/23 1116     Visit Number 20    Number of Visits 30    Date for PT Re-Evaluation 06/17/23    Authorization Type [employer] 20% coinsurance    Authorization Time Period recert 04/22/23 to 06/17/23    Progress Note Due on Visit 24    PT Start Time 1100    PT Stop Time 1138    PT Time Calculation (min) 38 min    Activity Tolerance Patient tolerated treatment well    Behavior During Therapy [facility] for tasks assessed/performed                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   Qualifier: Diagnosis of  By: Debby Bud MD, Rosalyn Gess    Sleep apnea    Type II diabetes mellitus with renal manifestations, uncontrolled 08/03/2007   Qualifier: Diagnosis of  By: Debby Bud MD, Rosalyn Gess  Med metformin    Past Surgical History:  Procedure Laterality Date   A-FLUTTER ABLATION N/A 04/30/2023   Procedure: A-FLUTTER ABLATION;  Surgeon: Nobie Putnam, MD;  Location: [facility] INVASIVE CV LAB;  Service: Cardiovascular;   Laterality: N/A;   CARDIOVERSION N/A 07/18/2019   Procedure: CARDIOVERSION;  Surgeon: Lars Masson, MD;  Location: [facility] ENDOSCOPY;  Service: Cardiovascular;  Laterality: N/A;   IRRIGATION AND DEBRIDEMENT KNEE Right 02/18/2023   Procedure: RIGHT KNEE ARTHROSCOPIC IRRIGATION AND DEBRIDEMENT;  Surgeon: Tarry Kos, MD;  Location: [facility] ORS;  Service: Orthopedics;  Laterality: Right;   LOOP RECORDER INSERTION N/A 04/30/2023   Procedure: LOOP RECORDER INSERTION;  Surgeon: Nobie Putnam, MD;  Location: [facility] INVASIVE CV LAB;  Service: Cardiovascular;  Laterality: N/A;   TEE WITHOUT CARDIOVERSION N/A 07/18/2019   Procedure: TRANSESOPHAGEAL ECHOCARDIOGRAM (TEE);  Surgeon: Lars Masson, MD;  Location: [facility] ENDOSCOPY;  Service: Cardiovascular;  Laterality: N/A;   TOOTH EXTRACTION     XI ROBOTIC ASSISTED SIMPLE PROSTATECTOMY N/A 05/31/2020   Procedure: XI ROBOTIC ASSISTED SIMPLE PROSTATECTOMY-SUPRAPUBIC;  Surgeon: Malen Gauze, MD;  Location: [facility]S;  Service: Urology;  Laterality: N/A;   Patient Active Problem List   Diagnosis Date Noted   Rheumatoid factor positive 02/10/2023   Knee pain, right 01/21/2023   Hyperlipidemia 01/20/2023   Effusion, right knee 01/20/2023   Hypercoagulable state due to typical atrial flutter (HCC) 01/13/2023   Venous stasis of lower extremity 01/09/2023   BPH with obstruction/lower urinary tract symptoms 05/31/2020   Paroxysmal  atrial flutter (HCC)    Adrenal nodule (HCC) 03/28/2020   OSA (obstructive sleep apnea) 03/28/2020   Morbid obesity (HCC) 03/28/2020   Medication noncompliance due to cognitive impairment 03/28/2020   Chronic systolic CHF (congestive heart failure) (HCC) 03/28/2020   CKD (chronic kidney disease) stage 4, GFR 15-29 ml/min (HCC) 06/10/2017   PSORIASIS, 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   Type 2 diabetes with complication (HCC) 08/03/2007   Hyperlipidemia associated with type 2 diabetes mellitus (HCC) 08/03/2007   Essential hypertension 08/03/2007     PCP: Myrlene Broker, MD  REFERRING PROVIDER: Tarry Kos, MD   REFERRING DIAG: [phone] (ICD-10-CM) - Effusion, right knee   THERAPY DIAG:  Muscle weakness (generalized)  Stiffness of left knee, not elsewhere classified  Other abnormalities of gait and mobility  Rationale for Evaluation and Treatment: Rehabilitation  ONSET DATE: Right knee scope, irrigation and debriement, major synovectomy 02/18/2023  SUBJECTIVE:   SUBJECTIVE STATEMENT: He relays doing well, feels ready to finish up with PT today, only difficulty is with carrying case of water up the stairs.   PERTINENT HISTORY: Right knee scope 02/18/2023 PMH:RA,OSA,Obesity,CHF,CKD,DM2 To wear knee immobilizer until quad function improves  PAIN:  NPRS scale:   0/10 Pain location:  Pain description:  Aggravating factors:  Relieving factors:    PRECAUTIONS: None  RED FLAGS: None   WEIGHT BEARING RESTRICTIONS: No  FALLS:  Has patient fallen in last 6 months? 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  DIAGNOSTIC FINDINGS:  03/09/2023 review:  XR 01/05/23 IMPRESSION: Mild osteoarthritis with moderate knee joint effusion.  PATIENT SURVEYS:  03/09/2023 FOTO 57% functional goal is 71%; 04/22/23- 55%  06/17/23: FOTO improved to 85% and met goal  COGNITION: 03/09/2023 Overall cognitive status: Within functional limits for tasks assessed      EDEMA:  03/09/2023 Moderate edema noted in Rt knee and Lower leg   LOWER EXTREMITY ROM:   AROM/PROM Right eval Right 03/13/2023 Right 03/19/23 Right 04/22/23 Right 06/16/22  Hip flexion       Hip extension       Hip abduction       Hip adduction       Hip internal rotation       Hip  external rotation       Knee flexion 60/80 Passive 97  Supine AROM 107* A:115 P:  Knee extension 15/10 Passive lacks 10 Supine P: -8* A: quad set -10* Supine AROM -9* Supine  AROM 0  Ankle dorsiflexion       Ankle plantarflexion       Ankle inversion       Ankle eversion        (Blank rows = not tested)  LOWER EXTREMITY MMT:  MMT Right eval Left 04/06/23 Right 04/13/2023 Left 04/13/2023 Right 04/22/23 Left 04/22/23 Right 05/12/2023 Right 06/17/23  Hip flexion 2       5  Hip extension          Hip abduction        5  Hip adduction          Hip internal rotation          Hip external rotation          Knee flexion 3 4   4  5  5  Knee extension 2 3 13.3, 12.7 lbs 5/5 93.6 lbs 3- 5 4/5 30.3, 33 lbs 45#  on  Right   Ankle dorsiflexion          Ankle plantarflexion          Ankle inversion          Ankle eversion           (Blank rows = not tested)  GAIT: 05/21/2023:  Independent ambulation in clinic without noted deviation upon arrival today.   04/16/2023: SPC use in Rt UE per Pt preference despite encouragement for use in Lt arm.   04/01/2023:   FWW or crutches reported.   FWW into clinc.   03/09/2023 Comments: Currently using RW and in knee immobilizer druing ambulation. He did not use AD PLOF                  TODAY'S TREATMENT:                                                           DATE:   06/17/2023 Nustep lvl 7 LE only 10 mins Incline gastroc stretch 30 sec x 4 bilateral Knee flexion stretch 5 sec X 10 AAROM Updated measurements and goals HEP review FOTO SURVEY   PATIENT EDUCATION: 03/09/2023 Education details: HEP, PT plan of care Person educated: Patient Education method: Explanation, Demonstration, Verbal cues, and Handouts Education comprehension: verbalized understanding and needs further education   HOME EXERCISE PROGRAM: Access Code: [facility] URL: [email or web address] Date: 03/19/2023 Prepared by: [name] Faster  Exercises -  Supine Quad Set  - 2 x daily - 6 x weekly - 2-3 sets - 10 reps - 5 sec hold - Supine Heel Slide with Strap  - 2 x daily - 6 x weekly - 2-3 sets - 10 reps - 5 hold - straight leg raise with help from strap  - 2 x daily - 6 x weekly - 1-2 sets - 10 reps - 10 seconds hold - Seated March  - 2 x daily - 6 x weekly - 2-3 sets - 10 reps - Seated Long Arc Quad  - 2 x daily - 6 x weekly - 2-3 sets - 10 reps - 3 hold - Long Sitting Quad Set with Towel Roll Under Heel  - 5 x daily - 7 x weekly - 2 sets - 10 reps - 5 seconds hold - Prone Knee Extension with Ankle Weight  - 2-3 x daily - 7 x weekly - 1 sets - 2-3 reps - 2 minutes hold - Prone Knee Flexion  - 2-3 x daily - 7 x weekly - 2-3 sets - 10 reps - 5 seconds hold - Prone Knee Extension Overpressure  - 2-3 x daily - 7 x weekly - 2-3 sets - 10 reps - 5 seconds hold   ASSESSMENT:  CLINICAL IMPRESSION:   Patient goals were met so he will be discharged today.He agrees to this plan. He is only missing some mild quad strength that he should regain with continued HEP and return to work.   OBJECTIVE IMPAIRMENTS: decreased activity tolerance, difficulty walking, decreased balance, decreased endurance, decreased mobility, decreased ROM, decreased strength, impaired flexibility, impaired LE use, and pain.  ACTIVITY LIMITATIONS: bending, lifting, carry, locomotion, cleaning, community activity, driving, and  occupation  PERSONAL FACTORS: see above PMH are also affecting patient's functional outcome.  REHAB POTENTIAL: Good  CLINICAL DECISION MAKING: Stable/uncomplicated  EVALUATION COMPLEXITY: Low    GOALS: Short term PT Goals Target date: 05/20/2023     Pt will be I and compliant with HEP. Baseline:  Goal status: MET 04/22/23   Long term PT goals 06/17/2023     Pt will improve Rt knee ROM to [facility] (3-115 deg) to improve functional mobility Baseline: Goal status: MET 06/17/23  Pt will improve Rt hip/knee strength to at least 4+/5 MMT to  improve functional strength Baseline: Goal status:  MET 06/17/23  Pt will improve FOTO to at least 71% functional to show improved function Baseline: Goal status: MET 06/17/23  Pt will reduce pain to overall less than 2-3/10 with usual activity and feel ready to return to work activity. Baseline: Goal status: MET 06/17/23 Pt will be able to ambulate community distances at least 1000 ft WNL gait pattern without AD Baseline: Goal status:  MET 06/17/23  PLAN: PT FREQUENCY: 2x/week   PT DURATION: 8 weeks   PLANNED INTERVENTIONS (unless contraindicated): aquatic PT, Canalith repositioning, cryotherapy, Electrical stimulation, Iontophoresis with 4 mg/ml dexamethasome, Moist heat, traction, Ultrasound, gait training, Therapeutic exercise, balance training, neuromuscular re-education, patient/family education, prosthetic training, manual techniques, passive ROM, dry needling, taping, vasopnuematic device, vestibular, spinal manipulations, joint manipulations 97110-Therapeutic exercises, 97530- Therapeutic activity, [id number]- Neuromuscular re-education, 97535- Self Care, and 16109- Manual therapy  PLAN FOR NEXT SESSION:  DC today   Ivery Quale, PT, DPT 06/17/23 11:16 AM

## 2023-06-23 ENCOUNTER — Ambulatory Visit (INDEPENDENT_AMBULATORY_CARE_PROVIDER_SITE_OTHER): Payer: BC Managed Care – PPO | Admitting: Orthopaedic Surgery

## 2023-06-23 ENCOUNTER — Encounter: Payer: Self-pay | Admitting: Orthopaedic Surgery

## 2023-06-23 DIAGNOSIS — L4050 Arthropathic psoriasis, unspecified: Secondary | ICD-10-CM | POA: Diagnosis not present

## 2023-06-23 NOTE — Progress Notes (Signed)
Post-Op Visit Note   Patient: Johnny [name]           Date of Birth: Sep 04, 1955           MRN: [id number] Visit Date: 06/23/2023 PCP: Myrlene Broker, MD   Assessment & Plan:  Chief Complaint:  Chief Complaint  Patient presents with   Right Knee - Follow-up    Right knee scope 02/18/2023   Visit Diagnoses:  1. Psoriatic arthritis (HCC)     Plan: Johnny [name] returns today for postop check.  He is status post right knee scope on 02/18/2023.  He has no complaints and doing well ready to return back to work.  Examination right knee shows fully healed surgical scar.  He does have numerous psoriatic plaques around the knee.  He has full range of motion.  Johnny [name] has recover from his surgery well.  His primary care doctor will make a referral for treatment of his psoriasis.  Work note provided.  From my standpoint we can see him back as needed.  Follow-Up Instructions: No follow-ups on file.   Orders:  No orders of the defined types were placed in this encounter.  No orders of the defined types were placed in this encounter.   Imaging: No results found.  PMFS History: Patient Active Problem List   Diagnosis Date Noted   Rheumatoid factor positive 02/10/2023   Knee pain, right 01/21/2023   Hyperlipidemia 01/20/2023   Effusion, right knee 01/20/2023   Hypercoagulable state due to typical atrial flutter (HCC) 01/13/2023   Venous stasis of lower extremity 01/09/2023   BPH with obstruction/lower urinary tract symptoms 05/31/2020   Paroxysmal atrial flutter (HCC)    Adrenal nodule (HCC) 03/28/2020   OSA (obstructive sleep apnea) 03/28/2020   Morbid obesity (HCC) 03/28/2020   Medication noncompliance due to cognitive impairment 03/28/2020   Chronic systolic CHF (congestive heart failure) (HCC) 03/28/2020   CKD (chronic kidney disease) stage 4, GFR 15-29 ml/min (HCC) 06/10/2017   PSORIASIS, 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   Type 2 diabetes with complication (HCC) 08/03/2007    Hyperlipidemia associated with type 2 diabetes mellitus (HCC) 08/03/2007   Essential hypertension 08/03/2007   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   Qualifier: Diagnosis of  By: Debby Bud MD, Rosalyn Gess    Sleep apnea    Type II diabetes mellitus with renal manifestations, uncontrolled 08/03/2007   Qualifier: Diagnosis of  By: Debby Bud MD, Rosalyn Gess  Med metformin     Family History  Problem Relation Age of Onset   Memory loss Mother        DOB 61   Kidney disease Mother        s/p nephrectomy, infection problem   Dementia Mother    Hypertension Father        DOB [phone]   Hyperlipidemia Father    Diabetes Father    Coronary artery disease  Father        with stents   Nephrolithiasis Father    Kidney disease Father        s/p rejected kidney transplant; on HD   Cancer Brother        lung   HIV Brother    Prostate cancer Neg Hx    Colon cancer Neg Hx     Past Surgical History:  Procedure Laterality Date   A-FLUTTER ABLATION N/A 04/30/2023   Procedure: A-FLUTTER ABLATION;  Surgeon: Nobie Putnam, MD;  Location: [facility] INVASIVE CV LAB;  Service: Cardiovascular;  Laterality: N/A;   CARDIOVERSION N/A 07/18/2019   Procedure: CARDIOVERSION;  Surgeon: Lars Masson, MD;  Location: [facility] ENDOSCOPY;  Service: Cardiovascular;  Laterality: N/A;   IRRIGATION AND DEBRIDEMENT KNEE Right 02/18/2023   Procedure: RIGHT KNEE ARTHROSCOPIC IRRIGATION AND DEBRIDEMENT;  Surgeon: Tarry Kos, MD;  Location: [facility];  Service: Orthopedics;  Laterality: Right;   LOOP RECORDER INSERTION N/A 04/30/2023   Procedure: LOOP RECORDER INSERTION;  Surgeon: Nobie Putnam, MD;  Location: [facility] INVASIVE CV LAB;  Service: Cardiovascular;  Laterality: N/A;   TEE WITHOUT CARDIOVERSION N/A 07/18/2019   Procedure: TRANSESOPHAGEAL ECHOCARDIOGRAM (TEE);  Surgeon: Lars Masson, MD;  Location: [facility] ENDOSCOPY;  Service: Cardiovascular;  Laterality: N/A;   TOOTH EXTRACTION     XI ROBOTIC ASSISTED SIMPLE PROSTATECTOMY N/A 05/31/2020   Procedure: XI ROBOTIC ASSISTED SIMPLE PROSTATECTOMY-SUPRAPUBIC;  Surgeon: Malen Gauze, MD;  Location: [facility];  Service: Urology;  Laterality: N/A;   Social History   Occupational History   Occupation: mfg  Tobacco Use   Smoking status: Never    Passive exposure: Yes   Smokeless tobacco: Never  Vaping Use   Vaping status: Never Used  Substance and Sexual Activity   Alcohol use: No   Drug use: No   Sexual activity: Yes

## 2023-06-24 ENCOUNTER — Ambulatory Visit: Payer: BC Managed Care – PPO | Admitting: Urology

## 2023-06-25 NOTE — Progress Notes (Deleted)
 Office Visit Note  Patient: Johnny [name]             Date of Birth: March 08, 1956           MRN: [id number]             PCP: Myrlene Broker, MD Referring: Persons, [place], [place] Visit Date: 07/09/2023 Occupation: @GUAROCC @  Subjective:  No chief complaint on file.   History of Present Illness: Johnny [name] is a 68 y.o. male ***     Activities of Daily Living:  Patient reports morning stiffness for *** {minute/hour:19697}.   Patient {ACTIONS;DENIES/REPORTS:21021675::"Denies"} nocturnal pain.  Difficulty dressing/grooming: {ACTIONS;DENIES/REPORTS:21021675::"Denies"} Difficulty climbing stairs: {ACTIONS;DENIES/REPORTS:21021675::"Denies"} Difficulty getting out of chair: {ACTIONS;DENIES/REPORTS:21021675::"Denies"} Difficulty using hands for taps, buttons, cutlery, and/or writing: {ACTIONS;DENIES/REPORTS:21021675::"Denies"}  No Rheumatology ROS completed.   PMFS History:  Patient Active Problem List   Diagnosis Date Noted   Rheumatoid factor positive 02/10/2023   Knee pain, right 01/21/2023   Hyperlipidemia 01/20/2023   Effusion, right knee 01/20/2023   Hypercoagulable state due to typical atrial flutter (HCC) 01/13/2023   Venous stasis of lower extremity 01/09/2023   BPH with obstruction/lower urinary tract symptoms 05/31/2020   Paroxysmal atrial flutter (HCC)    Adrenal nodule (HCC) 03/28/2020   OSA (obstructive sleep apnea) 03/28/2020   Morbid obesity (HCC) 03/28/2020   Medication noncompliance due to cognitive impairment 03/28/2020   Chronic systolic CHF (congestive heart failure) (HCC) 03/28/2020   CKD (chronic kidney disease) stage 4, GFR 15-29 ml/min (HCC) 06/10/2017   PSORIASIS, 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   Type 2 diabetes with complication (HCC) 08/03/2007   Hyperlipidemia associated with type 2 diabetes mellitus (HCC) 08/03/2007   Essential hypertension 08/03/2007    Past Medical History:  Diagnosis Date   Arthritis    CKD (chronic kidney disease) stage  3, GFR 30-59 ml/min (HCC) 06/10/2017   Constipation 03/27/2020   Diabetes mellitus    Dysrhythmia    A flutter   Essential hypertension 08/03/2007   Qualifier: Diagnosis of  By: Debby Bud MD, Rosalyn Gess  Med ARB, diuretic     Guttate psoriasis    Hyperlipidemia associated with type 2 diabetes mellitus (HCC) 08/03/2007   Qualifier: Diagnosis of  By: Debby Bud MD, Rosalyn Gess  meds - none    Paroxysmal atrial flutter (HCC)    S/P TEE/DCCV 07/2019   PSORIASIS, 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   Qualifier: Diagnosis of  By: Debby Bud MD, Rosalyn Gess    Sleep apnea    Type II diabetes mellitus with renal manifestations, uncontrolled 08/03/2007   Qualifier: Diagnosis of  By: Debby Bud MD, Rosalyn Gess  Med metformin     Family History  Problem Relation Age of Onset   Memory loss Mother        DOB 48   Kidney disease Mother        s/p nephrectomy, infection problem   Dementia Mother    Hypertension Father        DOB [phone]   Hyperlipidemia Father    Diabetes Father    Coronary artery disease Father        with stents   Nephrolithiasis Father    Kidney disease Father        s/p rejected kidney transplant; on HD   Cancer Brother        lung   HIV Brother    Prostate cancer Neg Hx    Colon cancer Neg Hx    Past Surgical History:  Procedure Laterality Date   A-FLUTTER ABLATION N/A  04/30/2023   Procedure: A-FLUTTER ABLATION;  Surgeon: Nobie Putnam, MD;  Location: [facility] INVASIVE CV LAB;  Service: Cardiovascular;  Laterality: N/A;   CARDIOVERSION N/A 07/18/2019   Procedure: CARDIOVERSION;  Surgeon: Lars Masson, MD;  Location: [facility] ENDOSCOPY;  Service: Cardiovascular;  Laterality: N/A;   IRRIGATION AND DEBRIDEMENT KNEE Right 02/18/2023   Procedure: RIGHT KNEE ARTHROSCOPIC IRRIGATION AND DEBRIDEMENT;  Surgeon: Tarry Kos, MD;  Location: [facility];  Service: Orthopedics;  Laterality: Right;   LOOP RECORDER INSERTION N/A 04/30/2023   Procedure: LOOP RECORDER INSERTION;  Surgeon: Nobie Putnam, MD;  Location: [facility]  INVASIVE CV LAB;  Service: Cardiovascular;  Laterality: N/A;   TEE WITHOUT CARDIOVERSION N/A 07/18/2019   Procedure: TRANSESOPHAGEAL ECHOCARDIOGRAM (TEE);  Surgeon: Lars Masson, MD;  Location: [facility] ENDOSCOPY;  Service: Cardiovascular;  Laterality: N/A;   TOOTH EXTRACTION     XI ROBOTIC ASSISTED SIMPLE PROSTATECTOMY N/A 05/31/2020   Procedure: XI ROBOTIC ASSISTED SIMPLE PROSTATECTOMY-SUPRAPUBIC;  Surgeon: Malen Gauze, MD;  Location: [facility];  Service: Urology;  Laterality: N/A;   Social History   Social History Narrative   [employer]. [employer] x 4 years. Married '94. 2 sons - '95, '96. Wife is healthy. Work - Training and development officer.   Immunization History  Administered Date(s) Administered   PFIZER Comirnaty(Gray Top)Covid-19 Tri-Sucrose Vaccine 12/08/2020   PFIZER(Purple Top)SARS-COV-2 Vaccination 06/11/2019, 07/02/2019, 02/24/2020   Pneumococcal Polysaccharide-23 11/04/2011   Tdap 11/04/2011, 01/10/2022     Objective: Vital Signs: There were no vitals taken for this visit.   Physical Exam   Musculoskeletal Exam: ***  CDAI Exam: CDAI Score: -- Patient Global: --; Provider Global: -- Swollen: --; Tender: -- Joint Exam 07/09/2023   No joint exam has been documented for this visit   There is currently no information documented on the homunculus. Go to the Rheumatology activity and complete the homunculus joint exam.  Investigation: No additional findings.  Imaging: CUP PACEART REMOTE DEVICE CHECK Result Date: 06/01/2023 ILR summary report received. Battery status OK. Normal device function. No new symptom, tachy, brady, or pause episodes. No new AF episodes. Monthly summary reports and ROV/PRN [place], CVRS   Recent Labs: Lab Results  Component Value Date   WBC 7.4 04/16/2023   HGB 12.9 (L) 04/16/2023   PLT 261 04/16/2023   NA 142 04/16/2023   K 4.7 04/16/2023   CL 107 (H) 04/16/2023   CO2 22 04/16/2023   GLUCOSE 112 (H) 04/16/2023   BUN 24 04/16/2023   CREATININE 2.26  (H) 04/16/2023   BILITOT 1.2 01/19/2023   ALKPHOS 76 01/19/2023   AST 39 01/19/2023   ALT 53 (H) 01/19/2023   PROT 8.2 (H) 01/19/2023   ALBUMIN 3.0 (L) 02/09/2023   CALCIUM 9.3 04/16/2023   GFRAA 42 (L) 07/25/2019   January 20, 2023 right knee synovial fluid WBC 14,000 875, crystals negative, uric acid 9.8, sed rate 120, CRP 40.2, ANA negative, RF 23.2, HIV negative January 29, 2023 uric acid 4.5 February 09, 2023 anti-CCP negative  IMPRESSION: Mild osteoarthritis with moderate knee joint effusion.  Electronically Signed   By: Narda Rutherford M.D.   On: 01/05/2023 21:54  Speciality Comments: No specialty comments available.  Procedures:  No procedures performed Allergies: Patient has no known allergies.   Assessment / Plan:     Visit Diagnoses: No diagnosis found.  Orders: No orders of the defined types were placed in this encounter.  No orders of the defined types were placed in this encounter.   Face-to-face time spent  with patient was *** minutes. Greater than 50% of time was spent in counseling and coordination of care.  Follow-Up Instructions: No follow-ups on file.   Pollyann Savoy, MD  Note - This record has been created using Animal nutritionist.  Chart creation errors have been sought, but may not always  have been located. Such creation errors do not reflect on  the standard of medical care.

## 2023-06-26 ENCOUNTER — Ambulatory Visit: Payer: BC Managed Care – PPO | Admitting: Urology

## 2023-06-26 VITALS — BP 156/77 | HR 74

## 2023-06-26 DIAGNOSIS — Z87438 Personal history of other diseases of male genital organs: Secondary | ICD-10-CM | POA: Diagnosis not present

## 2023-06-26 DIAGNOSIS — N400 Enlarged prostate without lower urinary tract symptoms: Secondary | ICD-10-CM

## 2023-06-26 DIAGNOSIS — N138 Other obstructive and reflux uropathy: Secondary | ICD-10-CM

## 2023-06-26 DIAGNOSIS — R351 Nocturia: Secondary | ICD-10-CM | POA: Diagnosis not present

## 2023-06-26 DIAGNOSIS — N3001 Acute cystitis with hematuria: Secondary | ICD-10-CM

## 2023-06-26 DIAGNOSIS — R82998 Other abnormal findings in urine: Secondary | ICD-10-CM

## 2023-06-26 MED ORDER — FINASTERIDE 5 MG PO TABS: 5.0000 mg | ORAL_TABLET | Freq: Every day | ORAL | 3 refills | Status: DC

## 2023-06-26 NOTE — Progress Notes (Signed)
 06/26/2023 3:20 PM   Johnny [name] 31-Oct-1955 [id number]  Referring provider: Myrlene Broker, MD [address] [place],  [place] [address]  Followup BPH   HPI: Johnny [name] is a 67yo here for followup for BPH and nocturia. IPSS 3 QOL 1 on finasteride. No gross hematuria. Urine stream is strong. No straining to urinate. 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   Qualifier: Diagnosis of  By: Debby Bud MD, Rosalyn Gess    Sleep apnea    Type II diabetes mellitus with renal manifestations, uncontrolled 08/03/2007   Qualifier: Diagnosis of  By: Debby Bud MD, Rosalyn Gess  Med metformin     Surgical History: Past Surgical History:  Procedure Laterality Date   A-FLUTTER ABLATION N/A 04/30/2023   Procedure: A-FLUTTER ABLATION;  Surgeon: Nobie Putnam, MD;  Location: [facility] INVASIVE CV LAB;  Service: Cardiovascular;  Laterality: N/A;   CARDIOVERSION N/A 07/18/2019   Procedure: CARDIOVERSION;  Surgeon: Lars Masson, MD;  Location: [facility] ENDOSCOPY;  Service: Cardiovascular;  Laterality: N/A;   IRRIGATION AND DEBRIDEMENT KNEE Right 02/18/2023   Procedure: RIGHT KNEE ARTHROSCOPIC IRRIGATION AND DEBRIDEMENT;  Surgeon: Tarry Kos, MD;  Location: [facility] ORS;  Service: Orthopedics;  Laterality: Right;   LOOP RECORDER INSERTION N/A 04/30/2023   Procedure: LOOP RECORDER INSERTION;  Surgeon: Nobie Putnam, MD;  Location: [facility] INVASIVE CV  LAB;  Service: Cardiovascular;  Laterality: N/A;   TEE WITHOUT CARDIOVERSION N/A 07/18/2019   Procedure: TRANSESOPHAGEAL ECHOCARDIOGRAM (TEE);  Surgeon: Lars Masson, MD;  Location: [facility] ENDOSCOPY;  Service: Cardiovascular;  Laterality: N/A;   TOOTH EXTRACTION     XI ROBOTIC ASSISTED SIMPLE PROSTATECTOMY N/A 05/31/2020   Procedure: XI ROBOTIC ASSISTED SIMPLE PROSTATECTOMY-SUPRAPUBIC;  Surgeon: Malen Gauze, MD;  Location: [facility] ORS;  Service: Urology;  Laterality: N/A;    Home Medications:  Allergies as of 06/26/2023   No Known Allergies      Medication List        Accurate as of June 26, 2023  3:20 PM. If you have any questions, ask your nurse or doctor.          atorvastatin 80 MG tablet Commonly known as: LIPITOR Take 1 tablet (80 mg total) by mouth daily.   carvedilol 25 MG tablet Commonly known as: COREG Take 25 mg by mouth 2 (two) times daily with a meal.   Eliquis 5 MG Tabs tablet Generic drug: apixaban TAKE 1 TABLET BY MOUTH TWICE A DAY   finasteride 5 MG tablet Commonly known as: PROSCAR TAKE 1 TABLET (5 MG TOTAL) BY MOUTH DAILY.   glipiZIDE 5 MG tablet Commonly known as: GLUCOTROL Take 1 tablet (5 mg total) by mouth 2 (two) times daily before a meal.   hydrALAZINE 50 MG tablet Commonly known as: APRESOLINE TAKE 1 TABLET BY MOUTH THREE TIMES A DAY  isosorbide mononitrate 60 MG 24 hr tablet Commonly known as: IMDUR TAKE 1 TABLET BY MOUTH EVERY DAY   tirzepatide 5 MG/0.5ML Pen Commonly known as: MOUNJARO Inject 5 mg into the skin once a week.        Allergies: No Known Allergies  Family History: Family History  Problem Relation Age of Onset   Memory loss Mother        DOB 39   Kidney disease Mother        s/p nephrectomy, infection problem   Dementia Mother    Hypertension Father        DOB [phone]   Hyperlipidemia Father    Diabetes Father    Coronary artery disease Father        with stents   Nephrolithiasis Father     Kidney disease Father        s/p rejected kidney transplant; on HD   Cancer Brother        lung   HIV Brother    Prostate cancer Neg Hx    Colon cancer Neg Hx     Social History:  reports that he has never smoked. He has been exposed to tobacco smoke. He has never used smokeless tobacco. He reports that he does not drink alcohol and does not use drugs.  ROS: All other review of systems were reviewed and are negative except what is noted above in HPI  Physical Exam: BP (!) 156/77   Pulse 74   Constitutional:  Alert and oriented, No acute distress. HEENT: [place] AT, moist mucus membranes.  Trachea midline, no masses. Cardiovascular: No clubbing, cyanosis, or edema. Respiratory: Normal respiratory effort, no increased work of breathing. GI: Abdomen is soft, nontender, nondistended, no abdominal masses GU: No CVA tenderness.  Lymph: No cervical or inguinal lymphadenopathy. Skin: No rashes, bruises or suspicious lesions. Neurologic: Grossly intact, no focal deficits, moving all 4 extremities. Psychiatric: Normal mood and affect.  Laboratory Data: Lab Results  Component Value Date   WBC 7.4 04/16/2023   HGB 12.9 (L) 04/16/2023   HCT 40.6 04/16/2023   MCV 90 04/16/2023   PLT 261 04/16/2023    Lab Results  Component Value Date   CREATININE 2.26 (H) 04/16/2023    Lab Results  Component Value Date   PSA 11.14 (H) 11/19/2012   PSA 7.47 (H) 11/04/2011   PSA 3.43 04/23/2009    No results found for: "TESTOSTERONE"  Lab Results  Component Value Date   HGBA1C 7.3 (A) 03/24/2023    Urinalysis    Component Value Date/Time   COLORURINE YELLOW 01/19/2023 1702   APPEARANCEUR CLOUDY (A) 01/19/2023 1702   APPEARANCEUR Clear 05/28/2022 1454   LABSPEC 1.023 01/19/2023 1702   PHURINE 5.0 01/19/2023 1702   GLUCOSEU >=500 (A) 01/19/2023 1702   GLUCOSEU >=1000 04/23/2009 0911   HGBUR SMALL (A) 01/19/2023 1702   BILIRUBINUR NEGATIVE 01/19/2023 1702   BILIRUBINUR Negative 05/28/2022  1454   KETONESUR 5 (A) 01/19/2023 1702   PROTEINUR 30 (A) 01/19/2023 1702   UROBILINOGEN negative (A) 11/04/2019 1419   UROBILINOGEN 0.2 04/23/2009 0911   NITRITE NEGATIVE 01/19/2023 1702   LEUKOCYTESUR NEGATIVE 01/19/2023 1702    Lab Results  Component Value Date   LABMICR See below: 05/28/2022   WBCUA 0-5 05/28/2022   LABEPIT 0-10 05/28/2022   MUCUS Present 05/27/2021   BACTERIA RARE (A) 01/19/2023    Pertinent Imaging:  Results for orders placed during the hospital encounter of 09/20/19  DG Abdomen 1 View  Narrative CLINICAL DATA:  Constipation and trouble breathing  EXAM: ABDOMEN - 1 VIEW  COMPARISON:  None.  FINDINGS: The bowel gas pattern is normal. No abnormal stool retention. Pelvic calcifications on the right have the appearance of phlebolith. No abnormal osseous findings.  IMPRESSION: Normal bowel gas pattern.  No abnormal stool retention.   Electronically Signed By: Marnee Spring M.D. On: 09/20/2019 05:09  No results found for this or any previous visit.  No results found for this or any previous visit.  No results found for this or any previous visit.  Results for orders placed during the hospital encounter of 01/19/23  US RENAL  Narrative CLINICAL DATA:  Renal failure  EXAM: RENAL / URINARY TRACT ULTRASOUND COMPLETE  COMPARISON:  None Available.  FINDINGS: Right Kidney:  Renal measurements: 12.6 x 6.0 x 4.6 cm. = volume: 183 mL. Echogenicity within normal limits. No mass or hydronephrosis visualized.  Left Kidney:  Renal measurements: 11.2 x 5.8 x 4.3 cm. = volume: 144 mL. Echogenicity within normal limits. No mass or hydronephrosis visualized.  Bladder:  Appears normal for degree of bladder distention.  Other:  None.  IMPRESSION: Normal appearing kidneys bilaterally.   Electronically Signed By: Alcide Clever M.D. On: 01/22/2023 00:48  No results found for this or any previous visit.  No results found for this  or any previous visit.  No results found for this or any previous visit.   Assessment & Plan:    1. BPH with urinary obstruction -patient doing well after surgery. WE will stop finasteride  2. Nocturia Patient wishes to stop finasteride. Followup 1 year with PSA  3. Acute cystitis -urine for culture, will call with results  No follow-ups on file.  Wilkie Aye, MD  [facility] [place]

## 2023-06-30 ENCOUNTER — Telehealth: Payer: Self-pay

## 2023-06-30 ENCOUNTER — Encounter: Payer: Self-pay | Admitting: Urology

## 2023-06-30 LAB — URINE CULTURE

## 2023-06-30 MED ORDER — NITROFURANTOIN MONOHYD MACRO 100 MG PO CAPS: 100.0000 mg | ORAL_CAPSULE | Freq: Two times a day (BID) | ORAL | 0 refills | Status: DC

## 2023-06-30 NOTE — Telephone Encounter (Signed)
 Patient called with no answer. Detailed message left on voicemail with a request to call office back to confirm message was received.  Per MD Macrobid sent to pharmacy due to positive urine specimen.

## 2023-06-30 NOTE — Patient Instructions (Signed)

## 2023-06-30 NOTE — Telephone Encounter (Signed)
 Patient left message with answering service returning call

## 2023-07-01 NOTE — Telephone Encounter (Signed)
 Patient called with no answer. Message left to return call during office hours to confirm message was  received about starting antibiotic.

## 2023-07-02 NOTE — Telephone Encounter (Signed)
 Spoke with patient and he states that he picked up the antibiotic and is taking it.

## 2023-07-06 ENCOUNTER — Ambulatory Visit: Payer: BC Managed Care – PPO

## 2023-07-06 DIAGNOSIS — I4892 Unspecified atrial flutter: Secondary | ICD-10-CM

## 2023-07-07 LAB — CUP PACEART REMOTE DEVICE CHECK
Date Time Interrogation Session: 20250303071900
Implantable Pulse Generator Implant Date: 20241226
Pulse Gen Serial Number: 128227

## 2023-07-09 ENCOUNTER — Encounter: Payer: BC Managed Care – PPO | Admitting: Rheumatology

## 2023-07-09 DIAGNOSIS — E1169 Type 2 diabetes mellitus with other specified complication: Secondary | ICD-10-CM

## 2023-07-09 DIAGNOSIS — I878 Other specified disorders of veins: Secondary | ICD-10-CM

## 2023-07-09 DIAGNOSIS — B351 Tinea unguium: Secondary | ICD-10-CM

## 2023-07-09 DIAGNOSIS — M25461 Effusion, right knee: Secondary | ICD-10-CM

## 2023-07-09 DIAGNOSIS — G4733 Obstructive sleep apnea (adult) (pediatric): Secondary | ICD-10-CM

## 2023-07-09 DIAGNOSIS — L408 Other psoriasis: Secondary | ICD-10-CM

## 2023-07-09 DIAGNOSIS — N184 Chronic kidney disease, stage 4 (severe): Secondary | ICD-10-CM

## 2023-07-09 DIAGNOSIS — I5022 Chronic systolic (congestive) heart failure: Secondary | ICD-10-CM

## 2023-07-09 DIAGNOSIS — E118 Type 2 diabetes mellitus with unspecified complications: Secondary | ICD-10-CM

## 2023-07-09 DIAGNOSIS — Z7901 Long term (current) use of anticoagulants: Secondary | ICD-10-CM

## 2023-07-09 DIAGNOSIS — I4892 Unspecified atrial flutter: Secondary | ICD-10-CM

## 2023-07-09 DIAGNOSIS — R768 Other specified abnormal immunological findings in serum: Secondary | ICD-10-CM

## 2023-07-09 DIAGNOSIS — I10 Essential (primary) hypertension: Secondary | ICD-10-CM

## 2023-07-09 DIAGNOSIS — E279 Disorder of adrenal gland, unspecified: Secondary | ICD-10-CM

## 2023-07-09 DIAGNOSIS — M1990 Unspecified osteoarthritis, unspecified site: Secondary | ICD-10-CM

## 2023-07-09 DIAGNOSIS — R419 Unspecified symptoms and signs involving cognitive functions and awareness: Secondary | ICD-10-CM

## 2023-07-09 NOTE — Progress Notes (Signed)
 BSX Loop Recorder

## 2023-07-21 ENCOUNTER — Encounter: Payer: Self-pay | Admitting: Internal Medicine

## 2023-07-21 ENCOUNTER — Ambulatory Visit (INDEPENDENT_AMBULATORY_CARE_PROVIDER_SITE_OTHER): Payer: BC Managed Care – PPO | Admitting: Internal Medicine

## 2023-07-21 VITALS — BP 148/92 | HR 69 | Ht 77.0 in | Wt 292.0 lb

## 2023-07-21 DIAGNOSIS — E1142 Type 2 diabetes mellitus with diabetic polyneuropathy: Secondary | ICD-10-CM | POA: Diagnosis not present

## 2023-07-21 DIAGNOSIS — D3502 Benign neoplasm of left adrenal gland: Secondary | ICD-10-CM | POA: Diagnosis not present

## 2023-07-21 DIAGNOSIS — Z7984 Long term (current) use of oral hypoglycemic drugs: Secondary | ICD-10-CM

## 2023-07-21 DIAGNOSIS — D3501 Benign neoplasm of right adrenal gland: Secondary | ICD-10-CM | POA: Diagnosis not present

## 2023-07-21 DIAGNOSIS — L409 Psoriasis, unspecified: Secondary | ICD-10-CM | POA: Diagnosis not present

## 2023-07-21 DIAGNOSIS — Z7985 Long-term (current) use of injectable non-insulin antidiabetic drugs: Secondary | ICD-10-CM | POA: Diagnosis not present

## 2023-07-21 LAB — POCT GLYCOSYLATED HEMOGLOBIN (HGB A1C): Hemoglobin A1C: 6.7 % — AB (ref 4.0–5.6)

## 2023-07-21 MED ORDER — GLIPIZIDE 5 MG PO TABS: 5.0000 mg | ORAL_TABLET | Freq: Every day | ORAL | 2 refills | Status: DC

## 2023-07-21 MED ORDER — DEXAMETHASONE 1 MG PO TABS: 1.0000 mg | ORAL_TABLET | Freq: Once | ORAL | 0 refills | Status: AC

## 2023-07-21 MED ORDER — TIRZEPATIDE 7.5 MG/0.5ML ~~LOC~~ SOAJ: 7.5000 mg | SUBCUTANEOUS | 3 refills | Status: DC

## 2023-07-21 NOTE — Patient Instructions (Addendum)
 Increase Mounjaro 7.5 mg weekly  Decrease Glipizide 5 mg, 1 tablet before Breakfast    Instructions for Dexamethasone Suppression Test   Step 1: Choose a morning when you can come to our lab at 8:00 am for a blood draw.   Step 2: On the night before the blood draw, take one 1 mg tablet of dexamethasone at 11:30 pm.  The timing is VERY important!   Step 3: The next morning, go to the lab for blood work at 8:00 am.  Bonita Quin do not have to be on an empty stomach, but the timing is VERY important!

## 2023-07-21 NOTE — Progress Notes (Unsigned)
 Name: Johnny [name]  Age/ Sex: 68 y.o., male   MRN/ DOB: [id number], [birth year]     PCP: Myrlene Broker, MD   Reason for Endocrinology Evaluation: Type 2 Diabetes Mellitus  Initial Endocrine Consultative Visit: 08/20/2018    PATIENT IDENTIFIER: [name] is a 68 y.o. male with a past medical history of T2DM, psoriasis, HTN and OSA (Dx 08/2019) . The patient has followed with Endocrinology clinic since 08/20/2018 for consultative assistance with management of his diabetes.     DIABETIC HISTORY:  Johnny [name] was diagnosed with T2DM in 2012,. He was on Metformin,Farxiga and Glimepiride in the past. He has never been on insulin. His hemoglobin A1c has ranged from 9.0 % in 2013, peaking at 13.5% in 06/2018.  On his initial visit to our clinic his A1c was 13.5%. He was not taking any of his meds. He was restarted on Metformin and glipizide.   Trulicity was started 02/2020  Stop metformin due to low GFR 05/2022  Switch Trulicity to [facility] 11/2022  Mounjaro and [place] Pepper were discontinued during hospitalization in 2024, we restarted Mounjaro 03/2023  ADRENAL HISTORY: Upon review of his chart he was noted to have bilateral adrenal nodules on CT abdomen and pelvis from 03/2020.  Right adrenal nodule 3.3 cm and left adrenal nodule 2.8 cm  He did have normal renin and Aldo during evaluation for uncontrolled HTN in 09/2018   SUBJECTIVE:   During the last visit (03/24/2023): A1c 9.8 %.    Today (07/21/2023): Johnny [name] is here for a  follow up on his diabetes.  He checks his blood sugars 2 times daily, preprandial to breakfast and supper. The patient has not hypoglycemic episodes since the last clinic visit.   Had a follow-up with cardiology for a flutter and cardiomyopathy, he is s/p Best boy recorder, s/p ablation 04/2023 Was seen by urology for BPH and nocturia 06/2023  Follows with Podiatry - Dr. Allena Katz , last follow-up 01/28/2023  Denies nausea or  vomiting  Denies constipation or diarrhea  Denies palpitations  Denies dizziness or lightheadedness  Has psoriasis , symptoms have been worse . Would like a referral to dermatology   HOME DIABETES REGIMEN:  Glipizide 5 mg,  1 tablet BID  Mounjaro 5 mg weekly     METER DOWNLOAD SUMMARY: Memory recall 85-110 mg/dL   DIABETIC COMPLICATIONS: Microvascular complications:  Neuropathy, CKD Denies: retinopathy  Last eye exam: Completed 05/2020   Macrovascular complications:    Denies: CAD, PVD, CVA     HISTORY:  Past Medical History:  Past Medical History:  Diagnosis Date  . Arthritis   . CKD (chronic kidney disease) stage 3, GFR 30-59 ml/min (HCC) 06/10/2017  . Constipation 03/27/2020  . Diabetes mellitus   . Dysrhythmia    A flutter  . Essential hypertension 08/03/2007   Qualifier: Diagnosis of  By: Debby Bud MD, Rosalyn Gess  Med ARB, diuretic    . Guttate psoriasis   . Hyperlipidemia associated with type 2 diabetes mellitus (HCC) 08/03/2007   Qualifier: Diagnosis of  By: Debby Bud MD, Rosalyn Gess  meds - none   . Paroxysmal atrial flutter (HCC)    S/P TEE/DCCV 07/2019  . 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 Sleep apnea   . Type II diabetes mellitus with renal manifestations, uncontrolled 08/03/2007   Qualifier: Diagnosis of  By: Debby Bud MD, Rosalyn Gess  Med metformin    Past Surgical History:  Past Surgical History:  Procedure Laterality Date  . A-FLUTTER ABLATION N/A 04/30/2023   Procedure: A-FLUTTER ABLATION;  Surgeon: Nobie Putnam, MD;  Location: [facility] INVASIVE CV LAB;  Service: Cardiovascular;  Laterality: N/A;  . CARDIOVERSION N/A 07/18/2019   Procedure: CARDIOVERSION;  Surgeon: Lars Masson, MD;  Location: [facility] ENDOSCOPY;  Service: Cardiovascular;  Laterality: N/A;  . IRRIGATION AND DEBRIDEMENT KNEE Right 02/18/2023   Procedure: RIGHT KNEE ARTHROSCOPIC IRRIGATION AND DEBRIDEMENT;  Surgeon: Tarry Kos, MD;  Location: [facility]  ORS;  Service: Orthopedics;  Laterality: Right;  . LOOP RECORDER INSERTION N/A 04/30/2023   Procedure: LOOP RECORDER INSERTION;  Surgeon: Nobie Putnam, MD;  Location: [facility] INVASIVE CV LAB;  Service: Cardiovascular;  Laterality: N/A;  . TEE WITHOUT CARDIOVERSION N/A 07/18/2019   Procedure: TRANSESOPHAGEAL ECHOCARDIOGRAM (TEE);  Surgeon: Lars Masson, MD;  Location: [facility] ENDOSCOPY;  Service: Cardiovascular;  Laterality: N/A;  . TOOTH EXTRACTION    . XI ROBOTIC ASSISTED SIMPLE PROSTATECTOMY N/A 05/31/2020   Procedure: XI ROBOTIC ASSISTED SIMPLE PROSTATECTOMY-SUPRAPUBIC;  Surgeon: Malen Gauze, MD;  Location: [facility] ORS;  Service: Urology;  Laterality: N/A;   Social History:  reports that he has never smoked. He has been exposed to tobacco smoke. He has never used smokeless tobacco. He reports that he does not drink alcohol and does not use drugs. Family History:  Family History  Problem Relation Age of Onset  . Memory loss Mother        DOB 65  . Kidney disease Mother        s/p nephrectomy, infection problem  . Dementia Mother   . Hypertension Father        DOB [birth year]  . Hyperlipidemia Father   . Diabetes Father   . Coronary artery disease Father        with stents  . Nephrolithiasis Father   . Kidney disease Father        s/p rejected kidney transplant; on HD  . Cancer Brother        lung  . HIV Brother   . Prostate cancer Neg Hx   . Colon cancer Neg Hx      HOME MEDICATIONS: Allergies as of 07/21/2023   No Known Allergies      Medication List        Accurate as of July 21, 2023  2:50 PM. If you have any questions, ask your nurse or doctor.          STOP taking these medications    nitrofurantoin (macrocrystal-monohydrate) 100 MG capsule Commonly known as: MACROBID Stopped by: Johnney Ou Ramonia Mcclaran       TAKE these medications    amiodarone 200 MG tablet Commonly known as: PACERONE Take 200 mg by mouth daily.   atorvastatin 80 MG tablet Commonly  known as: LIPITOR Take 1 tablet (80 mg total) by mouth daily.   carvedilol 25 MG tablet Commonly known as: COREG Take 25 mg by mouth 2 (two) times daily with a meal.   Eliquis 5 MG Tabs tablet Generic drug: apixaban TAKE 1 TABLET BY MOUTH TWICE A DAY   finasteride 5 MG tablet Commonly known as: PROSCAR Take 1 tablet (5 mg total) by mouth daily.   glipiZIDE 5 MG tablet Commonly known as: GLUCOTROL Take 1 tablet (5 mg total) by mouth 2 (two) times daily before a meal.   hydrALAZINE 50 MG tablet Commonly known as: APRESOLINE TAKE 1 TABLET BY MOUTH THREE TIMES A DAY   isosorbide mononitrate 60 MG  24 hr tablet Commonly known as: IMDUR TAKE 1 TABLET BY MOUTH EVERY DAY   olmesartan-hydrochlorothiazide 40-12.5 MG tablet Commonly known as: BENICAR HCT Take 1 tablet by mouth daily.   tirzepatide 5 MG/0.5ML Pen Commonly known as: MOUNJARO Inject 5 mg into the skin once a week.         OBJECTIVE:   Vital Signs: BP (!) 148/92   Pulse 69   Ht 6\' 5"  (1.956 m)   Wt 292 lb (132.5 kg)   SpO2 96%   BMI 34.63 kg/m   Wt Readings from Last 3 Encounters:  07/21/23 292 lb (132.5 kg)  05/29/23 284 lb 3.2 oz (128.9 kg)  04/30/23 275 lb (124.7 kg)     Exam: General: Pt appears well and is in NAD  Lungs: Clear with good BS bilat   Heart: RRR   Neuro: MS is good with appropriate affect, pt is alert and Ox3    DM foot exam: 01/28/2023 per podiatry   DATA REVIEWED:  Lab Results  Component Value Date   HGBA1C 7.3 (A) 03/24/2023   HGBA1C 9.8 (A) 12/01/2022   HGBA1C 7.8 (A) 05/27/2022    Latest Reference Range & Units 07/21/23 15:16  MICROALB/CREAT RATIO <30 mg/g creat 117 (H)    Latest Reference Range & Units 07/21/23 15:16  Potassium 3.5 - 5.3 mmol/L 4.6    Latest Reference Range & Units 07/21/23 15:16  Microalb, Ur mg/dL 52.8  MICROALB/CREAT RATIO <30 mg/g creat 117 (H)  Creatinine, Urine 20 - 320 mg/dL 413  (H): Data is abnormally high    Latest Reference Range  & Units 04/16/23 09:55  Sodium 134 - 144 mmol/L 142  Potassium 3.5 - 5.2 mmol/L 4.7  Chloride 96 - 106 mmol/L 107 (H)  CO2 20 - 29 mmol/L 22  Glucose 70 - 99 mg/dL 244 (H)  BUN 8 - 27 mg/dL 24  Creatinine 0.10 - 2.72 mg/dL 5.36 (H)  Calcium 8.6 - 10.2 mg/dL 9.3  BUN/Creatinine Ratio 10 - 24  11  eGFR >59 mL/min/1.73 31 (L)       MR abdomen 07/03/2022  FINDINGS: Lower chest: No acute abnormality.   Hepatobiliary: No solid liver abnormality is seen. No gallstones, gallbladder wall thickening, or biliary dilatation.   Pancreas: Unremarkable. No pancreatic ductal dilatation or surrounding inflammatory changes.   Spleen: Normal in size without significant abnormality.   Adrenals/Urinary Tract: Unchanged, definitively benign bilateral macroscopic fat containing adrenal nodules, measuring 3.3 x 2.5 cm on the right and 2.3 x 1.8 cm on the left (series 8, image 16). Kidneys are normal, without renal calculi, solid lesion, or hydronephrosis.   Stomach/Bowel: Stomach is within normal limits. No evidence of bowel wall thickening, distention, or inflammatory changes.   Vascular/Lymphatic: No significant vascular findings are present. No enlarged abdominal lymph nodes.   Other: No abdominal wall hernia or abnormality. No ascites.   Musculoskeletal: No acute or significant osseous findings.   IMPRESSION: Unchanged, definitively benign bilateral macroscopic fat containing adrenal nodules, for which no further follow-up or characterization is required.      ASSESSMENT / PLAN / RECOMMENDATIONS:   1) Type 2 Diabetes Mellitus, Optimally   controlled, With CKD III and neuropathic  complications - Most recent A1c of 6.7%. Goal A1c < 7.0 %.  -A1c is optimal  -Metformin was discontinued 05/2022 due to low GFR -Jardiance was discontinued during hospitalization 2024, patient has BPH symptoms, will avoid -He is tolerating Mounjaro, will increase as below -Will decrease glipizide  preemptively  to avoid hypoglycemia    MEDICATIONS:  Decrease Glipizide  5 mg, 1 tablet before Breakfast  Increase  Mounjaro 7.5 mg weekly   EDUCATION / INSTRUCTIONS: BG monitoring instructions: Patient is instructed to check his blood sugars 1 times a day, fasting [facility] if: BG persistently < 70  I reviewed the Rule of 15 for the treatment of hypoglycemia in detail with the patient. Literature supplied.   2.  Bilateral adrenal nodules:   -Work up negative 06/2021 and 05/2022 including renin, Aldo, 24-hour urinary cortisol and catecholamines -Adrenal imaging 06/2022 showed stability  -We will proceed with Aldo, renin -Patient will to return for dexamethasone suppression test    3. Microalbuminuria :    Olmesartan-HCTZ   4. Psorisis :  -Patient has noted worsening symptoms and new areas have been affected with the psoriatic rash -A referral to rheumatology has been placed  F/U in 4 months    Signed electronically by: Lyndle Herrlich, MD  [facility] Endocrinology  [facility] [address] [place] [place], [place] [address] Phone: [phone] FAX: [phone]   CC: Myrlene Broker, MD [address] [place] [address] Phone: [phone]  Fax: [phone]  Return to Endocrinology clinic as below: Future Appointments  Date Time Provider Department Center  08/03/2023  3:45 PM Nobie Putnam, MD [facility]  08/10/2023  7:35 AM [facility] DEVICE REMOTES [facility]  08/19/2023  2:15 PM Candelaria Stagers, DPM [facility]  09/14/2023  7:35 AM [facility] DEVICE REMOTES [facility]  10/19/2023  7:35 AM [facility] DEVICE REMOTES [facility]  11/23/2023  7:35 AM [facility] DEVICE REMOTES [facility]  12/28/2023  7:35 AM [facility] DEVICE REMOTES [facility]  02/01/2024  7:35 AM [facility] DEVICE REMOTES [facility]  06/16/2024  2:00 PM [facility] None  06/22/2024  2:30 PM McKenzie, Mardene Celeste, MD [facility] None

## 2023-07-23 ENCOUNTER — Telehealth: Payer: Self-pay | Admitting: Internal Medicine

## 2023-07-23 NOTE — Telephone Encounter (Signed)
 LMTCB

## 2023-07-23 NOTE — Telephone Encounter (Signed)
 Please let the patient know that his urine test continues to show that he is leaking extra protein in the urine, this is due to diabetes, this has been fluctuating over time, he is already on olmesartan which helps protect his kidney, his A1c has gotten better which is also another good thing for his kidney.   We will just continue to monitor at this time

## 2023-07-24 NOTE — Telephone Encounter (Signed)
 Patient aware and verbalized understanding.

## 2023-07-27 ENCOUNTER — Other Ambulatory Visit

## 2023-07-28 LAB — MICROALBUMIN / CREATININE URINE RATIO
Creatinine, Urine: 156 mg/dL (ref 20–320)
Microalb Creat Ratio: 117 mg/g{creat} — ABNORMAL HIGH (ref <30)
Microalb, Ur: 18.2 mg/dL

## 2023-07-28 LAB — ALDOSTERONE + RENIN ACTIVITY W/ RATIO
ALDO / PRA Ratio: 4.8 ratio (ref 0.9–28.9)
Aldosterone: 10 ng/dL
Renin Activity: 2.08 ng/mL/h (ref 0.25–5.82)

## 2023-07-28 LAB — POTASSIUM: Potassium: 4.6 mmol/L (ref 3.5–5.3)

## 2023-07-28 LAB — CORTISOL: Cortisol, Plasma: 5.7 ug/dL

## 2023-08-03 ENCOUNTER — Encounter: Payer: Self-pay | Admitting: Cardiology

## 2023-08-03 ENCOUNTER — Ambulatory Visit: Payer: BC Managed Care – PPO | Attending: Cardiology | Admitting: Cardiology

## 2023-08-03 VITALS — BP 160/78 | HR 74 | Ht 77.0 in | Wt 289.2 lb

## 2023-08-03 DIAGNOSIS — I10 Essential (primary) hypertension: Secondary | ICD-10-CM

## 2023-08-03 DIAGNOSIS — D6869 Other thrombophilia: Secondary | ICD-10-CM | POA: Diagnosis not present

## 2023-08-03 DIAGNOSIS — I483 Typical atrial flutter: Secondary | ICD-10-CM

## 2023-08-03 DIAGNOSIS — Z9889 Other specified postprocedural states: Secondary | ICD-10-CM | POA: Diagnosis not present

## 2023-08-03 NOTE — Progress Notes (Signed)
 Electrophysiology Office Note:   Date:  08/03/2023  ID:  Johnny [name], DOB Sep 22, 1955, MRN [id number]  Primary Cardiologist: Armanda Magic, MD Electrophysiologist: Nobie Putnam, MD      History of Present Illness:   Johnny [name] is a 68 y.o. male with h/o CKD stage 4, DM, HTN, psoriasis, aortic atherosclerosis, OSA and arrhythmia induced cardiomyopathy who is seen today for Electrophysiology evaluation of atrial flutter at the request of Dr. Mayford Knife.    Discussed the use of AI scribe software for clinical note transcription with the patient, who gave verbal consent to proceed. History of Present Illness Johnny [name] is a 68 year old male who presents for follow-up after ablation for typical atrial flutter and loop recorder implant on 04/30/23. He also recently had knee surgery. He has been able to walk around normally and engage in outdoor work without any issues since the procedure. No fluttering, skipping beats, racing heart, or trouble breathing have been experienced. He has been monitored with a loop recorder, which has shown a normal rhythm with no atrial flutter or fibrillation. He stopped taking his blood thinner, Eliquis, last week as he ran out of the medication, and he is at the three-month mark post-ablation. He has also stopped taking amiodarone (Pacerone). He is active, including riding a real bike, and enjoys being outdoors[place] him to [place] safely. He has not noticed any issues with increased heart rate during exercise.  Review of systems complete and found to be negative unless listed in HPI.   EP Information / Studies Reviewed:       EKG 03/27/20:    EKG 01/06/23:    Echocardiogram 08/19/2019: Normal LV size and function.  LVEF 60 to 65%.  Mild concentric LVH. Normal RV function.  Mildly enlarged RV with normal pulmonary artery systolic pressures. Moderately dilated left and right atrial size. No significant  valvular heart disease mentioned.  TEE 07/18/19: -Performed in the setting of atrial flutter. Severely reduced LV systolic function, LVEF 25 to 40%.  Global hypokinesis with no wall motion abnormalities.  Risk Assessment/Calculations:    CHA2DS2-VASc Score = 4   This indicates a 4.8% annual risk of stroke. The patient's score is based upon: CHF History: 1 (previous TCM) HTN History: 1 Diabetes History: 1 Stroke History: 0 Vascular Disease History: 1 (aortic atherosclerosis) Age Score: 0 Gender Score: 0        Physical Exam:   VS:  BP (!) 160/78   Pulse 74   Ht 6\' 5"  (1.956 m)   Wt 289 lb 3.2 oz (131.2 kg)   SpO2 95%   BMI 34.29 kg/m    Wt Readings from Last 3 Encounters:  08/03/23 289 lb 3.2 oz (131.2 kg)  07/21/23 292 lb (132.5 kg)  05/29/23 284 lb 3.2 oz (128.9 kg)     GEN: Well nourished, well developed in no acute distress NECK: No JVD; No carotid bruits CARDIAC: Normal rate and regular rhythm. RESPIRATORY:  Clear to auscultation without rales, wheezing or rhonchi  ABDOMEN: Soft, non-tender, non-distended EXTREMITIES: Bilateral edema present; No deformity   ASSESSMENT AND PLAN:   Johnny [name] is a 68 y.o. male with h/o CKD stage 4, DM, HTN, psoriasis, aortic atherosclerosis, OSA and arrhythmia induced cardiomyopathy who is seen today for Electrophysiology evaluation of atrial flutter at the request of Dr. Mayford Knife.  Patient has had multiple episodes of typical atrial flutter.  We have never seen any  documented atrial fibrillation importantly, during 1 of these episodes echocardiogram was done and showed depressed LV function which did improve post cardioversion, consistent with an arrhythmia induced cardiomyopathy.  Given this, we prioritized a rhythm control strategy and patient underwent typical atrial flutter ablation with loop recorder implant on 04/30/2023.   #. Typical atrial flutter s/p ablation: No known recurrences. #. S/p loop recorder: -Stop  amiodarone. -Continue carvedilol 25 mg twice daily. -Continue monitoring for recurrence and for new atrial fibrillation with loop recorder implant.  As far, there have been no episodes of atrial fibrillation or atrial flutter on his loop recorder, including on today's interrogation.  #.  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 -Patient likes to regularly ride his bike on the road.  Given bleeding risk associated with this and that he had only atrial flutter and has not had any episodes since catheter ablation, we will stop anticoagulation and monitor with loop recorder.  If he were to develop recurrence of atrial flutter or new atrial fibrillation then we will resume the oral anticoagulation.  Risk and benefits of this were discussed with the patient who voiced understanding.  #. Hypertension:  -Above goal today.  Continue current regimen and close follow-up with primary care.  #.  Obstructive sleep apnea: -He is compliant with CPAP.  Signed, Nobie Putnam, MD

## 2023-08-03 NOTE — Patient Instructions (Signed)
 Medication Instructions:  Your physician recommends that you continue on your current medications as directed. Please refer to the Current Medication list given to you today.  *If you need a refill on your cardiac medications before your next appointment, please call your pharmacy*  Follow-Up: At [facility], you and your health needs are our priority.  As part of our continuing mission to provide you with exceptional heart care, our providers are all part of one team.  This team includes your primary Cardiologist (physician) and Advanced Practice Providers or APPs (Physician Assistants and Nurse Practitioners) who all work together to provide you with the care you need, when you need it.  Your next appointment:   6 months  Provider:   You may see Nobie Putnam, MD or one of the following Advanced Practice Providers on your designated Care Team:   Francis Dowse, [place] Casimiro Needle "Mardelle Matte" [place], [place] Sherie Don, NP Canary Brim, NP         1st Floor: - Lobby - Registration  - Pharmacy  - Lab - Cafe  2nd Floor: - PV Lab - Diagnostic Testing (echo, CT, nuclear med)  3rd Floor: - Vacant  4th Floor: - TCTS (cardiothoracic surgery) - AFib Clinic - Structural Heart Clinic - Vascular Surgery  - Vascular Ultrasound  5th Floor: - HeartCare Cardiology (general and EP) - Clinical Pharmacy for coumadin, hypertension, lipid, weight-loss medications, and med management appointments    Valet parking services will be available as well.

## 2023-08-06 NOTE — Addendum Note (Signed)
 Addended by: Geralyn Flash D on: 08/06/2023 01:34 PM   Modules accepted: Orders

## 2023-08-06 NOTE — Progress Notes (Signed)
 Bsx Loop Recorder

## 2023-08-08 ENCOUNTER — Other Ambulatory Visit: Payer: Self-pay

## 2023-08-08 ENCOUNTER — Emergency Department (HOSPITAL_COMMUNITY)
Admission: EM | Admit: 2023-08-08 | Discharge: 2023-08-08 | Disposition: A | Discharge status: Home / Self Care | Attending: Emergency Medicine | Admitting: Emergency Medicine

## 2023-08-08 ENCOUNTER — Encounter (HOSPITAL_COMMUNITY): Payer: Self-pay | Admitting: *Deleted

## 2023-08-08 DIAGNOSIS — Y93H2 Activity, gardening and landscaping: Secondary | ICD-10-CM | POA: Diagnosis not present

## 2023-08-08 DIAGNOSIS — Z7984 Long term (current) use of oral hypoglycemic drugs: Secondary | ICD-10-CM | POA: Diagnosis not present

## 2023-08-08 DIAGNOSIS — W228XXA Striking against or struck by other objects, initial encounter: Secondary | ICD-10-CM | POA: Diagnosis not present

## 2023-08-08 DIAGNOSIS — S81811A Laceration without foreign body, right lower leg, initial encounter: Secondary | ICD-10-CM | POA: Diagnosis present

## 2023-08-08 DIAGNOSIS — Z7901 Long term (current) use of anticoagulants: Secondary | ICD-10-CM | POA: Insufficient documentation

## 2023-08-08 DIAGNOSIS — E1122 Type 2 diabetes mellitus with diabetic chronic kidney disease: Secondary | ICD-10-CM | POA: Diagnosis not present

## 2023-08-08 DIAGNOSIS — N189 Chronic kidney disease, unspecified: Secondary | ICD-10-CM | POA: Insufficient documentation

## 2023-08-08 MED ORDER — LIDOCAINE-EPINEPHRINE (PF) 2 %-1:200000 IJ SOLN
10.0000 mL | Freq: Once | INTRAMUSCULAR | Status: AC
  Administered 2023-08-08: 10 mL
  Filled 2023-08-08: qty 20

## 2023-08-08 NOTE — ED Triage Notes (Signed)
 Pt says he was cutting a tree and when he was moving it, a branch hit his leg. Right lower leg laceration. Tetanus last year. Off of blood thinners for 2 weeks.

## 2023-08-08 NOTE — Discharge Instructions (Signed)
 Keep area clean dry and covered.  You can wash gently with soap and water.  You can apply Neosporin or bacitracin to the area twice daily.  Return if there are signs of infection or if you have fever.  Otherwise call primary care to schedule follow-up appointment and get sutures removed in 10 days.

## 2023-08-08 NOTE — ED Provider Notes (Signed)
 [place] EMERGENCY DEPARTMENT AT [facility] Provider Note   CSN: [id number] Arrival date & time: 08/08/23  2007     History  Chief Complaint  Patient presents with   Extremity Laceration    Johnny [name] is a 68 y.o. male.with past medical history of psoriasis, HLD, venous stasis, CKD, DM2 presenting with complaint of laceration to right lower lateral leg. This occurred earlier today when he was working on cutting down trees. He is no longer on Eliquis, stopped two weeks ago after having successful ablation for a flutter.  He reports his tetanus shot was last year.  The tray did not hit him in the head or knocked him over he did not have a fall.  His head laceration.  Bleeding controlled in triage.  HPI     Home Medications Prior to Admission medications   Medication Sig Start Date End Date Taking? Authorizing Provider  amiodarone (PACERONE) 200 MG tablet Take 200 mg by mouth daily. 05/10/23   [provider]  atorvastatin (LIPITOR) 80 MG tablet Take 1 tablet (80 mg total) by mouth daily. 04/06/23   Quintella Reichert, MD  carvedilol (COREG) 25 MG tablet Take 25 mg by mouth 2 (two) times daily with a meal.    [provider]  ELIQUIS 5 MG TABS tablet TAKE 1 TABLET BY MOUTH TWICE A DAY Patient not taking: Reported on 07/21/2023 03/06/23   Quintella Reichert, MD  finasteride (PROSCAR) 5 MG tablet Take 1 tablet (5 mg total) by mouth daily. Patient not taking: Reported on 08/03/2023 06/26/23   Malen Gauze, MD  glipiZIDE (GLUCOTROL) 5 MG tablet Take 1 tablet (5 mg total) by mouth daily before breakfast. 07/21/23   Shamleffer, Konrad Dolores, MD  hydrALAZINE (APRESOLINE) 50 MG tablet TAKE 1 TABLET BY MOUTH THREE TIMES A DAY 03/16/23   Myrlene Broker, MD  isosorbide mononitrate (IMDUR) 60 MG 24 hr tablet TAKE 1 TABLET BY MOUTH EVERY DAY 06/10/23   Myrlene Broker, MD  olmesartan-hydrochlorothiazide (BENICAR HCT) 40-12.5 MG tablet Take 1 tablet by mouth  daily. 06/09/23   [provider]  tirzepatide Greggory Keen) 7.5 MG/0.5ML Pen Inject 7.5 mg into the skin once a week. 07/21/23   Shamleffer, Konrad Dolores, MD      Allergies    Patient has no known allergies.    Review of Systems   Review of Systems  Skin:  Positive for wound.    Physical Exam Updated Vital Signs BP (!) 171/86 (BP Location: Right Arm)   Pulse 68   Temp 98.2 F (36.8 C) (Oral)   Resp 20   SpO2 98%  Physical Exam Vitals and nursing note reviewed.  Constitutional:      General: He is not in acute distress.    Appearance: He is not toxic-appearing.  HENT:     Head: Normocephalic and atraumatic.  Eyes:     General: No scleral icterus.    Conjunctiva/sclera: Conjunctivae normal.  Cardiovascular:     Rate and Rhythm: Normal rate and regular rhythm.     Pulses: Normal pulses.     Heart sounds: Normal heart sounds.  Pulmonary:     Effort: Pulmonary effort is normal. No respiratory distress.     Breath sounds: Normal breath sounds.  Abdominal:     General: Abdomen is flat. Bowel sounds are normal.     Palpations: Abdomen is soft.     Tenderness: There is no abdominal tenderness.  Musculoskeletal:  Right lower leg: No edema.     Left lower leg: No edema.  Skin:    General: Skin is warm and dry.     Findings: No lesion.     Comments: 1.5 cm laceration to lower right leg. Bleeding controled with pressure. No obvious FB or vessel involvement.   Neurological:     General: No focal deficit present.     Mental Status: He is alert and oriented to person, place, and time. Mental status is at baseline.     ED Results / Procedures / Treatments   Labs (all labs ordered are listed, but only abnormal results are displayed) Labs Reviewed - No data to display  EKG None  Radiology No results found.  Procedures .Laceration Repair  Date/Time: 08/08/2023 9:04 PM  Performed by: Smitty Knudsen, PA-C Authorized by: Smitty Knudsen, PA-C   Consent:     Consent obtained:  Verbal   Consent given by:  Patient   Risks, benefits, and alternatives were discussed: yes     Risks discussed:  Infection, need for additional repair, poor wound healing, nerve damage, retained foreign body, tendon damage, vascular damage, poor cosmetic result and pain   Alternatives discussed:  Referral, observation, delayed treatment and no treatment Universal protocol:    Procedure explained and questions answered to patient or proxy's satisfaction: yes     Relevant documents present and verified: yes     Test results available: yes     Imaging studies available: yes     Required blood products, implants, devices, and special equipment available: yes     Site/side marked: yes     Immediately prior to procedure, a time out was called: yes     Patient identity confirmed:  Verbally with patient Anesthesia:    Anesthesia method:  Local infiltration   Local anesthetic:  Lidocaine 2% WITH epi Laceration details:    Location:  Leg   Leg location:  R lower leg   Length (cm):  1.5 Pre-procedure details:    Preparation:  Patient was prepped and draped in usual sterile fashion Exploration:    Hemostasis achieved with:  Epinephrine and direct pressure   Contaminated: no   Treatment:    Area cleansed with:  Povidone-iodine   Amount of cleaning:  Standard   Irrigation solution:  Sterile saline   Irrigation method:  Pressure wash   Debridement:  None Skin repair:    Repair method:  Sutures   Suture size:  5-0   Suture material:  Nylon   Suture technique:  Simple interrupted   Number of sutures:  3 Approximation:    Approximation:  Close Repair type:    Repair type:  Simple Post-procedure details:    Dressing:  Open (no dressing)   Procedure completion:  Tolerated     Medications Ordered in ED Medications  lidocaine-EPINEPHrine (XYLOCAINE W/EPI) 2 %-1:200000 (PF) injection 10 mL (has no administration in time range)    ED Course/ Medical Decision Making/  A&P                                 Medical Decision Making Risk Prescription drug management.   [name] 68 y.o. presented today for a 1.5 cm laceration to their lower right leg. Working Ddx: FB, fracture, NV compromise, simple laceration.  R/o DDx: These ddx are considered less likely due to history of present illness and physical exam findings.  Pmhx considered:   Patient presented for a 1.5 cm laceration to their leg. They are neurovascularly intact. Tetanus is UTD. Patient is in no distress. Laceration will be repaired with standard wound care procedures and antibiotic ointment.    Problem List / ED Course / Critical interventions / Medication management  Patient reporting with laceration.  He is neurovascularly intact. Laceration without deep tissue damage. Sustained no other injury.  Wound irrigated with no foreign body.  Bleeding was not well-controlled with direct pressure. He has full ROM and ambulates without difficulty.  Tolerated laceration repair well.  He is stable appropriate for discharge. I ordered medication including lidocaine  Reevaluation of the patient after these medicines showed that the patient improved Patients vitals assessed. Upon arrival patient is hemodynamically stable.  I have reviewed the patients home medicines and have made adjustments as needed    Plan:  F/u for suture removal in 10 days.  Patient is stable for discharge at this time. Patient/family expressed understanding of return precautions and need for follow-up.  Keep wound clean, covered. Educated on sx of concern regarding complications -- such as infection, poor wound healing, compartment sx.          Final Clinical Impression(s) / ED Diagnoses Final diagnoses:  Laceration of right lower extremity, initial encounter    Rx / DC Orders ED Discharge Orders     None         Raford Pitcher Evalee [place] 08/08/23 [id number]    Cathren Laine, MD 08/09/23 [id number]

## 2023-08-10 ENCOUNTER — Ambulatory Visit (INDEPENDENT_AMBULATORY_CARE_PROVIDER_SITE_OTHER): Payer: BC Managed Care – PPO

## 2023-08-10 DIAGNOSIS — I483 Typical atrial flutter: Secondary | ICD-10-CM | POA: Diagnosis not present

## 2023-08-11 LAB — CUP PACEART REMOTE DEVICE CHECK
Date Time Interrogation Session: 20250407052200
Implantable Pulse Generator Implant Date: 20241226
Pulse Gen Serial Number: 128227

## 2023-08-12 ENCOUNTER — Ambulatory Visit: Payer: BC Managed Care – PPO | Admitting: Rheumatology

## 2023-08-19 ENCOUNTER — Ambulatory Visit (INDEPENDENT_AMBULATORY_CARE_PROVIDER_SITE_OTHER): Payer: BC Managed Care – PPO | Admitting: Podiatry

## 2023-08-19 DIAGNOSIS — M79675 Pain in left toe(s): Secondary | ICD-10-CM | POA: Diagnosis not present

## 2023-08-19 DIAGNOSIS — E1142 Type 2 diabetes mellitus with diabetic polyneuropathy: Secondary | ICD-10-CM

## 2023-08-19 DIAGNOSIS — B351 Tinea unguium: Secondary | ICD-10-CM | POA: Diagnosis not present

## 2023-08-19 DIAGNOSIS — M79674 Pain in right toe(s): Secondary | ICD-10-CM | POA: Diagnosis not present

## 2023-08-19 NOTE — Progress Notes (Signed)
  Subjective:  Patient ID: Johnny [name], male    DOB: October 24, 1955,  MRN: [id number]  Chief Complaint  Patient presents with   Nail Problem    Nail trim    68 y.o. male returns for the above complaint.  Patient presents with thickened elongated dystrophic toenails x10.  Patient is a painful to walk on.  Patient would like to have them debrided down as he is not able to do it himself.  Patient is a diabetic with last A1c of 7.6.  He does not have any secondary complaints.  Objective:  There were no vitals filed for this visit. Podiatric Exam: Vascular: dorsalis pedis and posterior tibial pulses are palpable bilateral. Capillary return is immediate. Temperature gradient is WNL. Skin turgor WNL  Sensorium: Normal Semmes Weinstein monofilament test. Normal tactile sensation bilaterally. Nail Exam: Pt has thick disfigured discolored nails with subungual debris noted bilateral entire nail hallux through fifth toenails.  Pain on palpation to the nails. Ulcer Exam: There is no evidence of ulcer or pre-ulcerative changes or infection. Orthopedic Exam: Muscle tone and strength are WNL. No limitations in general ROM. No crepitus or effusions noted. HAV  B/L.  Hammer toes 2-5  B/L. Skin: No Porokeratosis. No infection or ulcers.  Bilateral severe xerosis noted to bilateral lower extremity    Assessment & Plan:   No diagnosis found.        Patient was evaluated and treated and all questions answered.  Xerosis skin severe bilateral lower extremity -Clinically improved   Onychomycosis with pain  -Nails palliatively debrided as below. -Educated on self-care  Procedure: Nail Debridement Rationale: pain  Type of Debridement: manual, sharp debridement. Instrumentation: Nail nipper, rotary burr. Number of Nails: 10  Procedures and Treatment: Consent by patient was obtained for treatment procedures. The patient understood the discussion of treatment and procedures well. All questions were  answered thoroughly reviewed. Debridement of mycotic and hypertrophic toenails, 1 through 5 bilateral and clearing of subungual debris. No ulceration, no infection noted.  Return Visit-Office Procedure: Patient instructed to return to the office for a follow up visit 3 months for continued evaluation and treatment.  Tinnie Forehand, DPM    No follow-ups on file.

## 2023-09-14 ENCOUNTER — Ambulatory Visit: Payer: BC Managed Care – PPO

## 2023-09-14 DIAGNOSIS — I483 Typical atrial flutter: Secondary | ICD-10-CM | POA: Diagnosis not present

## 2023-09-18 LAB — CUP PACEART REMOTE DEVICE CHECK
Date Time Interrogation Session: 20250516064600
Implantable Pulse Generator Implant Date: 20241226
Pulse Gen Serial Number: 128227

## 2023-09-20 ENCOUNTER — Ambulatory Visit: Payer: Self-pay | Admitting: Cardiology

## 2023-09-22 NOTE — Progress Notes (Signed)
 Bsx loop Recorder

## 2023-09-22 NOTE — Addendum Note (Signed)
 Addended by: Edra Govern D on: 09/22/2023 03:53 PM   Modules accepted: Orders

## 2023-10-02 ENCOUNTER — Other Ambulatory Visit: Payer: Self-pay | Admitting: Internal Medicine

## 2023-10-10 ENCOUNTER — Other Ambulatory Visit: Payer: Self-pay | Admitting: Cardiology

## 2023-10-10 ENCOUNTER — Other Ambulatory Visit: Payer: Self-pay | Admitting: Internal Medicine

## 2023-10-13 ENCOUNTER — Other Ambulatory Visit: Payer: Self-pay | Admitting: *Deleted

## 2023-10-13 MED ORDER — OLMESARTAN MEDOXOMIL-HCTZ 40-12.5 MG PO TABS: 1.0000 | ORAL_TABLET | Freq: Every day | ORAL | 0 refills | Status: DC

## 2023-10-15 ENCOUNTER — Ambulatory Visit (INDEPENDENT_AMBULATORY_CARE_PROVIDER_SITE_OTHER)

## 2023-10-15 DIAGNOSIS — I483 Typical atrial flutter: Secondary | ICD-10-CM | POA: Diagnosis not present

## 2023-10-15 LAB — CUP PACEART REMOTE DEVICE CHECK
Date Time Interrogation Session: 20250612054900
Implantable Pulse Generator Implant Date: 20241226
Pulse Gen Serial Number: 128227

## 2023-10-18 ENCOUNTER — Ambulatory Visit: Payer: Self-pay | Admitting: Cardiology

## 2023-10-29 NOTE — Progress Notes (Signed)
 Bsx Loop Recorder

## 2023-10-29 NOTE — Addendum Note (Signed)
 Addended by: TAWNI DRILLING D on: 10/29/2023 05:16 PM   Modules accepted: Orders

## 2023-10-30 ENCOUNTER — Other Ambulatory Visit: Payer: Self-pay | Admitting: Cardiology

## 2023-11-16 ENCOUNTER — Ambulatory Visit

## 2023-11-16 DIAGNOSIS — I483 Typical atrial flutter: Secondary | ICD-10-CM

## 2023-11-16 LAB — CUP PACEART REMOTE DEVICE CHECK
Date Time Interrogation Session: 20250714045200
Implantable Pulse Generator Implant Date: 20241226
Pulse Gen Serial Number: 128227

## 2023-11-18 ENCOUNTER — Ambulatory Visit (INDEPENDENT_AMBULATORY_CARE_PROVIDER_SITE_OTHER): Admitting: Podiatry

## 2023-11-18 DIAGNOSIS — M79675 Pain in left toe(s): Secondary | ICD-10-CM | POA: Diagnosis not present

## 2023-11-18 DIAGNOSIS — M79674 Pain in right toe(s): Secondary | ICD-10-CM

## 2023-11-18 DIAGNOSIS — B351 Tinea unguium: Secondary | ICD-10-CM | POA: Diagnosis not present

## 2023-11-18 NOTE — Progress Notes (Signed)
  Subjective:  Patient ID: Johnny [name], male    DOB: 09/01/1955,  MRN: [id number]  Chief Complaint  Patient presents with   Nail Problem    Nail trim    68 y.o. male returns for the above complaint.  Patient presents with thickened elongated dystrophic toenails x10.  Patient is a painful to walk on.  Patient would like to have them debrided down as he is not able to do it himself.  Patient is a diabetic with last A1c of 7.6.  He does not have any secondary complaints.  Objective:  There were no vitals filed for this visit. Podiatric Exam: Vascular: dorsalis pedis and posterior tibial pulses are palpable bilateral. Capillary return is immediate. Temperature gradient is WNL. Skin turgor WNL  Sensorium: Normal Semmes Weinstein monofilament test. Normal tactile sensation bilaterally. Nail Exam: Pt has thick disfigured discolored nails with subungual debris noted bilateral entire nail hallux through fifth toenails.  Pain on palpation to the nails. Ulcer Exam: There is no evidence of ulcer or pre-ulcerative changes or infection. Orthopedic Exam: Muscle tone and strength are WNL. No limitations in general ROM. No crepitus or effusions noted. HAV  B/L.  Hammer toes 2-5  B/L. Skin: No Porokeratosis. No infection or ulcers.  Bilateral severe xerosis noted to bilateral lower extremity    Assessment & Plan:   1. Pain due to onychomycosis of toenails of both feet           Patient was evaluated and treated and all questions answered.  Xerosis skin severe bilateral lower extremity -Clinically improved   Onychomycosis with pain  -Nails palliatively debrided as below. -Educated on self-care  Procedure: Nail Debridement Rationale: pain  Type of Debridement: manual, sharp debridement. Instrumentation: Nail nipper, rotary burr. Number of Nails: 10  Procedures and Treatment: Consent by patient was obtained for treatment procedures. The patient understood the discussion of treatment  and procedures well. All questions were answered thoroughly reviewed. Debridement of mycotic and hypertrophic toenails, 1 through 5 bilateral and clearing of subungual debris. No ulceration, no infection noted.  Return Visit-Office Procedure: Patient instructed to return to the office for a follow up visit 3 months for continued evaluation and treatment.  Franky Blanch, DPM    No follow-ups on file.

## 2023-11-22 ENCOUNTER — Ambulatory Visit: Payer: Self-pay | Admitting: Cardiology

## 2023-12-08 NOTE — Progress Notes (Signed)
 [facility] Summary Report / Loop Recorder

## 2023-12-17 ENCOUNTER — Ambulatory Visit (INDEPENDENT_AMBULATORY_CARE_PROVIDER_SITE_OTHER)

## 2023-12-17 DIAGNOSIS — I483 Typical atrial flutter: Secondary | ICD-10-CM

## 2023-12-17 LAB — CUP PACEART REMOTE DEVICE CHECK
Date Time Interrogation Session: 20250814045500
Implantable Pulse Generator Implant Date: 20241226
Pulse Gen Serial Number: 128227

## 2023-12-20 ENCOUNTER — Ambulatory Visit: Payer: Self-pay | Admitting: Cardiology

## 2024-01-05 ENCOUNTER — Other Ambulatory Visit: Payer: Self-pay | Admitting: Internal Medicine

## 2024-01-18 ENCOUNTER — Ambulatory Visit (INDEPENDENT_AMBULATORY_CARE_PROVIDER_SITE_OTHER)

## 2024-01-18 DIAGNOSIS — I483 Typical atrial flutter: Secondary | ICD-10-CM | POA: Diagnosis not present

## 2024-01-21 LAB — CUP PACEART REMOTE DEVICE CHECK
Date Time Interrogation Session: 20250917183300
Implantable Pulse Generator Implant Date: 20241226
Pulse Gen Serial Number: 128227

## 2024-01-22 ENCOUNTER — Encounter: Payer: Self-pay | Admitting: Internal Medicine

## 2024-01-22 ENCOUNTER — Ambulatory Visit: Admitting: Internal Medicine

## 2024-01-22 ENCOUNTER — Other Ambulatory Visit: Payer: Self-pay

## 2024-01-22 VITALS — BP 134/80 | HR 77 | Ht 77.0 in | Wt 303.0 lb

## 2024-01-22 DIAGNOSIS — E1122 Type 2 diabetes mellitus with diabetic chronic kidney disease: Secondary | ICD-10-CM

## 2024-01-22 DIAGNOSIS — D3502 Benign neoplasm of left adrenal gland: Secondary | ICD-10-CM

## 2024-01-22 DIAGNOSIS — D3501 Benign neoplasm of right adrenal gland: Secondary | ICD-10-CM

## 2024-01-22 DIAGNOSIS — N1831 Chronic kidney disease, stage 3a: Secondary | ICD-10-CM

## 2024-01-22 DIAGNOSIS — E1142 Type 2 diabetes mellitus with diabetic polyneuropathy: Secondary | ICD-10-CM

## 2024-01-22 DIAGNOSIS — E1165 Type 2 diabetes mellitus with hyperglycemia: Secondary | ICD-10-CM

## 2024-01-22 LAB — POCT GLYCOSYLATED HEMOGLOBIN (HGB A1C): Hemoglobin A1C: 7.4 % — AB (ref 4.0–5.6)

## 2024-01-22 MED ORDER — TIRZEPATIDE 10 MG/0.5ML ~~LOC~~ SOAJ
10.0000 mg | SUBCUTANEOUS | 3 refills | Status: DC
  Filled 2024-01-22: qty 2, 28d supply, fill #0

## 2024-01-22 MED ORDER — GLIPIZIDE 5 MG PO TABS: 5.0000 mg | ORAL_TABLET | Freq: Every day | ORAL | 3 refills | Status: AC

## 2024-01-22 NOTE — Progress Notes (Signed)
 Name: Johnny [name]  Age/ Sex: 68 y.o., male   MRN/ DOB: [id number], 1955/05/13     PCP: Rollene Almarie LABOR, MD   Reason for Endocrinology Evaluation: Type 2 Diabetes Mellitus  Initial Endocrine Consultative Visit: 08/20/2018    PATIENT IDENTIFIER: [name] is a 68 y.o. male with a past medical history of T2DM, psoriasis, HTN and OSA (Dx 08/2019) . The patient has followed with Endocrinology clinic since 08/20/2018 for consultative assistance with management of his diabetes.     DIABETIC HISTORY:  [name] was diagnosed with T2DM in 2012,. He was on Metformin ,Farxiga  and Glimepiride  in the past. He has never been on insulin . His hemoglobin A1c has ranged from 9.0 % in 2013, peaking at 13.5% in 06/2018.  On his initial visit to our clinic his A1c was 13.5%. He was not taking any of his meds. He was restarted on Metformin  and glipizide .   Trulicity  was started 02/2020  Stop metformin  due to low GFR 05/2022  Switch Trulicity  to Mounjaro  11/2022  Mounjaro  and Jardiance  were discontinued during hospitalization in 2024, we restarted Mounjaro  03/2023  ADRENAL HISTORY: Upon review of his chart he was noted to have bilateral adrenal nodules on CT abdomen and pelvis from 03/2020.  Right adrenal nodule 3.3 cm and left adrenal nodule 2.8 cm  He did have normal renin and Aldo during evaluation for uncontrolled HTN in 09/2018   Aldo and renin normal March, 2025  Dexamethasone  suppression test was elevated with a serum cortisol 5.7 mcg/dL     SUBJECTIVE:   During the last visit (07/21/2023): A1c 6.7 %.     Today (01/22/2024): Johnny [name] is here for a  follow up on his diabetes.  He checks his blood sugars 2 times daily, preprandial to breakfast and supper. The patient has not hypoglycemic episodes since the last clinic visit.    Follows with Podiatry - Dr. Tobie , last follow-up 11/18/2023  Has been noted with weight gain  No nausea or vomiting  No constipation or  diarrhea  No palpitations  Bedtime 8 PM     HOME DIABETES REGIMEN:  Glipizide  5 mg,  1 tablet daily Mounjaro  7.5 mg weekly     METER DOWNLOAD SUMMARY: 821-01/22/2024 Number of readings 11 Average glucose 98 MGs/DL Target = 899     DIABETIC COMPLICATIONS: Microvascular complications:  Neuropathy, CKD Denies: retinopathy  Last eye exam: Completed 05/2020   Macrovascular complications:    Denies: CAD, PVD, CVA     HISTORY:  Past Medical History:  Past Medical History:  Diagnosis Date   Arthritis    CKD (chronic kidney disease) stage 3, GFR 30-59 ml/min (HCC) 06/10/2017   Constipation 03/27/2020   Diabetes mellitus    Dysrhythmia    A flutter   Essential hypertension 08/03/2007   Qualifier: Diagnosis of  By: Harlow MD, Ozell BRAVO  Med ARB, diuretic     Guttate psoriasis    Hyperlipidemia associated with type 2 diabetes mellitus (HCC) 08/03/2007   Qualifier: Diagnosis of  By: Harlow MD, Ozell BRAVO  meds - none    Paroxysmal atrial flutter (HCC)    S/P TEE/DCCV 07/2019   PSORIASIS, 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   Qualifier: Diagnosis of  By: Harlow MD, Ozell BRAVO    Sleep apnea    Type II diabetes mellitus with renal manifestations, uncontrolled 08/03/2007   Qualifier: Diagnosis of  By: Harlow MD, Ozell BRAVO  Med metformin     Past Surgical History:  Past Surgical History:  Procedure Laterality Date   A-FLUTTER ABLATION N/A 04/30/2023   Procedure: A-FLUTTER ABLATION;  Surgeon: Kennyth Chew, MD;  Location: [facility] INVASIVE CV LAB;  Service: Cardiovascular;  Laterality: N/A;   CARDIOVERSION N/A 07/18/2019   Procedure: CARDIOVERSION;  Surgeon: Maranda Leim DEL, MD;  Location: [facility] ENDOSCOPY;  Service: Cardiovascular;  Laterality: N/A;   IRRIGATION AND DEBRIDEMENT KNEE Right 02/18/2023   Procedure: RIGHT KNEE ARTHROSCOPIC IRRIGATION AND DEBRIDEMENT;  Surgeon: Jerri Kay HERO, MD;  Location: [facility] ORS;  Service: Orthopedics;  Laterality: Right;   LOOP RECORDER INSERTION N/A 04/30/2023    Procedure: LOOP RECORDER INSERTION;  Surgeon: Kennyth Chew, MD;  Location: [facility] INVASIVE CV LAB;  Service: Cardiovascular;  Laterality: N/A;   TEE WITHOUT CARDIOVERSION N/A 07/18/2019   Procedure: TRANSESOPHAGEAL ECHOCARDIOGRAM (TEE);  Surgeon: Maranda Leim DEL, MD;  Location: [facility] [place] ENDOSCOPY;  Service: Cardiovascular;  Laterality: N/A;   TOOTH EXTRACTION     XI ROBOTIC ASSISTED SIMPLE PROSTATECTOMY N/A 05/31/2020   Procedure: XI ROBOTIC ASSISTED SIMPLE PROSTATECTOMY-SUPRAPUBIC;  Surgeon: Sherrilee Belvie CROME, MD;  Location: [facility] ORS;  Service: Urology;  Laterality: N/A;   Social History:  reports that he has never smoked. He has been exposed to tobacco smoke. He has never used smokeless tobacco. He reports that he does not drink alcohol and does not use drugs. Family History:  Family History  Problem Relation Age of Onset   Memory loss Mother        DOB 57   Kidney disease Mother        s/p nephrectomy, infection problem   Dementia Mother    Hypertension Father        DOB [phone]   Hyperlipidemia Father    Diabetes Father    Coronary artery disease Father        with stents   Nephrolithiasis Father    Kidney disease Father        s/p rejected kidney transplant; on HD   Cancer Brother        lung   HIV Brother    Prostate cancer Neg Hx    Colon cancer Neg Hx      HOME MEDICATIONS: Allergies as of 01/22/2024   No Known Allergies      Medication List        Accurate as of January 22, 2024  3:18 PM. If you have any questions, ask your nurse or doctor.          amiodarone  200 MG tablet Commonly known as: PACERONE  Take 200 mg by mouth daily.   atorvastatin  80 MG tablet Commonly known as: LIPITOR  TAKE 1 TABLET BY MOUTH EVERY DAY   carvedilol  25 MG tablet Commonly known as: COREG  Take 25 mg by mouth 2 (two) times daily with a meal.   dexamethasone  1 MG tablet Commonly known as: DECADRON  Take 1 mg by mouth once.   Eliquis  5 MG Tabs tablet Generic drug:  apixaban  TAKE 1 TABLET BY MOUTH TWICE A DAY   finasteride  5 MG tablet Commonly known as: PROSCAR  Take 1 tablet (5 mg total) by mouth daily.   glipiZIDE  5 MG tablet Commonly known as: GLUCOTROL  TAKE 1 TABLET BY MOUTH DAILY BEFORE BREAKFAST.   hydrALAZINE  50 MG tablet Commonly known as: APRESOLINE  TAKE 1 TABLET BY MOUTH THREE TIMES A DAY   isosorbide  mononitrate 60 MG 24 hr tablet Commonly known as: IMDUR  TAKE 1 TABLET BY MOUTH EVERY DAY   olmesartan -hydrochlorothiazide  40-12.5 MG tablet Commonly known as: BENICAR  HCT TAKE 1 TABLET BY MOUTH  EVERY DAY   tirzepatide  7.5 MG/0.5ML Pen Commonly known as: MOUNJARO  Inject 7.5 mg into the skin once a week.         OBJECTIVE:   Vital Signs: BP 134/80 (BP Location: Left Arm, Patient Position: Sitting, Cuff Size: Normal)   Pulse 77   Ht 6' 5 (1.956 m)   Wt (!) 303 lb (137.4 kg)   SpO2 96%   BMI 35.93 kg/m   Wt Readings from Last 3 Encounters:  01/22/24 (!) 303 lb (137.4 kg)  08/03/23 289 lb 3.2 oz (131.2 kg)  07/21/23 292 lb (132.5 kg)     Exam: General: Pt appears well and is in NAD  Lungs: Clear with good BS bilat   Heart: RRR   Neuro: MS is good with appropriate affect, pt is alert and Ox3    DM foot exam: 11/18/2023 per podiatry   DATA REVIEWED:  Lab Results  Component Value Date   HGBA1C 7.4 (A) 01/22/2024   HGBA1C 6.7 (A) 07/21/2023   HGBA1C 7.3 (A) 03/24/2023    Latest Reference Range & Units 07/21/23 15:16  MICROALB/CREAT RATIO <30 mg/g creat 117 (H)    Latest Reference Range & Units 07/21/23 15:16  Potassium 3.5 - 5.3 mmol/L 4.6    Latest Reference Range & Units 07/21/23 15:16  Microalb, Ur mg/dL 81.7  MICROALB/CREAT RATIO <30 mg/g creat 117 (H)  Creatinine, Urine 20 - 320 mg/dL 843  (H): Data is abnormally high    Latest Reference Range & Units 04/16/23 09:55  Sodium 134 - 144 mmol/L 142  Potassium 3.5 - 5.2 mmol/L 4.7  Chloride 96 - 106 mmol/L 107 (H)  CO2 20 - 29 mmol/L 22  Glucose 70  - 99 mg/dL 887 (H)  BUN 8 - 27 mg/dL 24  Creatinine 9.23 - 8.72 mg/dL 7.73 (H)  Calcium  8.6 - 10.2 mg/dL 9.3  BUN/Creatinine Ratio 10 - 24  11  eGFR >59 mL/min/1.73 31 (L)     MR abdomen 07/03/2022  FINDINGS: Lower chest: No acute abnormality.   Hepatobiliary: No solid liver abnormality is seen. No gallstones, gallbladder wall thickening, or biliary dilatation.   Pancreas: Unremarkable. No pancreatic ductal dilatation or surrounding inflammatory changes.   Spleen: Normal in size without significant abnormality.   Adrenals/Urinary Tract: Unchanged, definitively benign bilateral macroscopic fat containing adrenal nodules, measuring 3.3 x 2.5 cm on the right and 2.3 x 1.8 cm on the left (series 8, image 16). Kidneys are normal, without renal calculi, solid lesion, or hydronephrosis.   Stomach/Bowel: Stomach is within normal limits. No evidence of bowel wall thickening, distention, or inflammatory changes.   Vascular/Lymphatic: No significant vascular findings are present. No enlarged abdominal lymph nodes.   Other: No abdominal wall hernia or abnormality. No ascites.   Musculoskeletal: No acute or significant osseous findings.   IMPRESSION: Unchanged, definitively benign bilateral macroscopic fat containing adrenal nodules, for which no further follow-up or characterization is required.      ASSESSMENT / PLAN / RECOMMENDATIONS:   1) Type 2 Diabetes Mellitus, Sub -Optimally   controlled, With CKD III and neuropathic  complications - Most recent A1c of 7.4%. Goal A1c < 7.0 %.  -A1c has trended up -Metformin  was discontinued 05/2022 due to low GFR -Jardiance  was discontinued during hospitalization 2024, patient has BPH symptoms, will avoid -He is tolerating Mounjaro , will increase as below - He has been taking glipizide  after supper, I have advised the patient to take glipizide  15-20 minutes before supper rather than after supper. -  He was provided with a printed  prescription and a coupon for Mounjaro  to check at the pharmacy downstairs as his co-pay went from $50 to $200 for a 49-month supply   MEDICATIONS:  Take glipizide   5 mg, 1 tablet before Supper  Increase  Mounjaro  10 mg weekly   EDUCATION / INSTRUCTIONS: BG monitoring instructions: Patient is instructed to check his blood sugars 1 times a day, fasting Call [facility] if: BG persistently < 70  I reviewed the Rule of 15 for the treatment of hypoglycemia in detail with the patient. Literature supplied.   2.  Bilateral adrenal nodules:   -Work up negative 06/2021 and 05/2022 including renin, Aldo, 24-hour urinary cortisol and catecholamines -Adrenal imaging 06/2022 showed stability  - Aldo and renin are normal - Dexamethasone  suppression test was abnormal in March, 2025, will proceed with saliva cortisol testing x 2.  Historically has had normal 24-hour urinary cortisol testing x2   3. Microalbuminuria :  -This has trended up -Will continue to monitor   Medication Continue olmesartan -HCTZ daily    F/U in 4 months    Signed electronically by: Stefano Redgie Butts, MD  [facility] Endocrinology  [facility] [address] [place] [place], [place] [address] Phone: [phone] FAX: [phone]   CC: Rollene Almarie LABOR, MD [address] [place] [address] Phone: [phone]  Fax: [phone]  Return to Endocrinology clinic as below: Future Appointments  Date Time Provider Department Center  02/17/2024  3:45 PM Tobie Franky SQUIBB, DPM [facility]  02/18/2024  7:20 AM [facility] DEVICE REMOTES [facility]  03/21/2024  7:25 AM [facility] DEVICE REMOTES [facility]  06/16/2024  2:00 PM [facility] None  06/22/2024  2:20 PM McKenzie, Belvie CROME, MD [facility] None

## 2024-01-22 NOTE — Patient Instructions (Addendum)
-  Increase Mounjaro  10 mg  once week  - Take Glipizide  5 mg, 1 tablet before  Supper      - HOW TO TREAT LOW BLOOD SUGARS (Blood sugar LESS THAN 70 MG/DL) Please follow the RULE OF 15 for the treatment of hypoglycemia treatment (when your (blood sugars are less than 70 mg/dL)   STEP 1: Take 15 grams of carbohydrates when your blood sugar is low, which includes:  3-4 GLUCOSE TABS  OR 3-4 OZ OF JUICE OR REGULAR SODA OR ONE TUBE OF GLUCOSE GEL    STEP 2: RECHECK blood sugar in 15 MINUTES STEP 3: If your blood sugar is still low at the 15 minute recheck --> then, go back to STEP 1 and treat AGAIN with another 15 grams of carbohydrates.

## 2024-01-24 ENCOUNTER — Ambulatory Visit: Payer: Self-pay | Admitting: Cardiology

## 2024-01-25 NOTE — Progress Notes (Signed)
 Remote Loop Recorder Transmission

## 2024-01-28 NOTE — Progress Notes (Signed)
 Remote Loop Recorder Transmission

## 2024-02-10 NOTE — Progress Notes (Signed)
 Remote Loop Recorder Transmission

## 2024-02-13 ENCOUNTER — Other Ambulatory Visit: Payer: Self-pay | Admitting: Internal Medicine

## 2024-02-17 ENCOUNTER — Ambulatory Visit: Admitting: Podiatry

## 2024-02-17 DIAGNOSIS — E1142 Type 2 diabetes mellitus with diabetic polyneuropathy: Secondary | ICD-10-CM

## 2024-02-17 DIAGNOSIS — B351 Tinea unguium: Secondary | ICD-10-CM | POA: Diagnosis not present

## 2024-02-17 DIAGNOSIS — M79674 Pain in right toe(s): Secondary | ICD-10-CM | POA: Diagnosis not present

## 2024-02-17 DIAGNOSIS — M79675 Pain in left toe(s): Secondary | ICD-10-CM

## 2024-02-17 NOTE — Progress Notes (Signed)
  Subjective:  Patient ID: Johnny [name], male    DOB: October 24, 1955,  MRN: [id number]  Chief Complaint  Patient presents with   Nail Problem    Nail trim    68 y.o. male returns for the above complaint.  Patient presents with thickened elongated dystrophic toenails x10.  Patient is a painful to walk on.  Patient would like to have them debrided down as he is not able to do it himself.  Patient is a diabetic with last A1c of 7.6.  He does not have any secondary complaints.  Objective:  There were no vitals filed for this visit. Podiatric Exam: Vascular: dorsalis pedis and posterior tibial pulses are palpable bilateral. Capillary return is immediate. Temperature gradient is WNL. Skin turgor WNL  Sensorium: Normal Semmes Weinstein monofilament test. Normal tactile sensation bilaterally. Nail Exam: Pt has thick disfigured discolored nails with subungual debris noted bilateral entire nail hallux through fifth toenails.  Pain on palpation to the nails. Ulcer Exam: There is no evidence of ulcer or pre-ulcerative changes or infection. Orthopedic Exam: Muscle tone and strength are WNL. No limitations in general ROM. No crepitus or effusions noted. HAV  B/L.  Hammer toes 2-5  B/L. Skin: No Porokeratosis. No infection or ulcers.  Bilateral severe xerosis noted to bilateral lower extremity    Assessment & Plan:   No diagnosis found.        Patient was evaluated and treated and all questions answered.  Xerosis skin severe bilateral lower extremity -Clinically improved   Onychomycosis with pain  -Nails palliatively debrided as below. -Educated on self-care  Procedure: Nail Debridement Rationale: pain  Type of Debridement: manual, sharp debridement. Instrumentation: Nail nipper, rotary burr. Number of Nails: 10  Procedures and Treatment: Consent by patient was obtained for treatment procedures. The patient understood the discussion of treatment and procedures well. All questions were  answered thoroughly reviewed. Debridement of mycotic and hypertrophic toenails, 1 through 5 bilateral and clearing of subungual debris. No ulceration, no infection noted.  Return Visit-Office Procedure: Patient instructed to return to the office for a follow up visit 3 months for continued evaluation and treatment.  Tinnie Forehand, DPM    No follow-ups on file.

## 2024-02-18 ENCOUNTER — Ambulatory Visit

## 2024-02-18 DIAGNOSIS — I483 Typical atrial flutter: Secondary | ICD-10-CM | POA: Diagnosis not present

## 2024-02-19 LAB — CUP PACEART REMOTE DEVICE CHECK
Date Time Interrogation Session: 20251016123600
Implantable Pulse Generator Implant Date: 20241226
Pulse Gen Serial Number: 128227

## 2024-02-24 NOTE — Progress Notes (Signed)
 Remote Loop Recorder Transmission

## 2024-02-27 ENCOUNTER — Ambulatory Visit: Payer: Self-pay | Admitting: Cardiology

## 2024-02-27 DIAGNOSIS — I4892 Unspecified atrial flutter: Secondary | ICD-10-CM

## 2024-02-29 MED ORDER — APIXABAN 5 MG PO TABS: 5.0000 mg | ORAL_TABLET | Freq: Two times a day (BID) | ORAL | 1 refills | Status: AC

## 2024-02-29 NOTE — Telephone Encounter (Signed)
 Patient returned RN's call.

## 2024-02-29 NOTE — Telephone Encounter (Signed)
 Pt called back wanting to speak with [facility]

## 2024-02-29 NOTE — Telephone Encounter (Signed)
 Spoke with patient.   He agrees with re-start of Eliquis  5mg  bid per Dr. Shaune orders.  RX sent in to [facility] [address] per patient's request.   Patient given time to ask questions, verbalizes understanding of instructions.

## 2024-02-29 NOTE — Telephone Encounter (Signed)
 Given patient's increase in AF events, Dr. Kennyth would like for him to resume his Eliquis  5mg  bid.   This was held at 07/2023 OV due to patient's concern for bleeding risks being a bike rider.  Dr. Kennyth agreed due to low burden at the time but in light of recent events, Dr. Kennyth would like return to [facility].   LM for patient to call back and discuss.

## 2024-03-03 ENCOUNTER — Encounter

## 2024-03-03 ENCOUNTER — Telehealth: Payer: Self-pay | Admitting: *Deleted

## 2024-03-03 NOTE — Telephone Encounter (Signed)
 Patient called in stating he is having trouble affording his Eliquis  prescription now as it has increased from $30 to $200 for a 90 day supply.   Staff message sent to [employer] MED [employer]ISTANCE TEAM to see if they could assist patient with affording his medication

## 2024-03-07 ENCOUNTER — Encounter: Payer: Self-pay | Admitting: Radiology

## 2024-03-19 ENCOUNTER — Other Ambulatory Visit: Payer: Self-pay | Admitting: Internal Medicine

## 2024-03-21 ENCOUNTER — Ambulatory Visit (INDEPENDENT_AMBULATORY_CARE_PROVIDER_SITE_OTHER)

## 2024-03-21 DIAGNOSIS — I4892 Unspecified atrial flutter: Secondary | ICD-10-CM | POA: Diagnosis not present

## 2024-03-21 LAB — CUP PACEART REMOTE DEVICE CHECK
Date Time Interrogation Session: 20251117052600
Implantable Pulse Generator Implant Date: 20241226
Pulse Gen Serial Number: 128227

## 2024-03-23 ENCOUNTER — Ambulatory Visit: Payer: Self-pay | Admitting: Cardiology

## 2024-03-23 NOTE — Progress Notes (Signed)
 Remote Loop Recorder Transmission

## 2024-03-24 NOTE — Telephone Encounter (Signed)
 Spoke with pt and notified needed a office visit. Pt has been scheduled for 05/03/24@940am . Pt stated that he has enough supply of meds to get to appt.

## 2024-04-04 ENCOUNTER — Encounter

## 2024-04-18 ENCOUNTER — Other Ambulatory Visit: Payer: Self-pay | Admitting: Internal Medicine

## 2024-04-21 ENCOUNTER — Ambulatory Visit

## 2024-04-21 DIAGNOSIS — I4892 Unspecified atrial flutter: Secondary | ICD-10-CM

## 2024-04-21 LAB — CUP PACEART REMOTE DEVICE CHECK
Date Time Interrogation Session: 20251218002900
Implantable Pulse Generator Implant Date: 20241226
Pulse Gen Serial Number: 128227

## 2024-04-22 ENCOUNTER — Ambulatory Visit: Payer: Self-pay | Admitting: Cardiology

## 2024-04-24 NOTE — Progress Notes (Signed)
 Remote Loop Recorder Transmission

## 2024-05-03 ENCOUNTER — Ambulatory Visit: Payer: Self-pay | Admitting: Internal Medicine

## 2024-05-03 ENCOUNTER — Encounter: Payer: Self-pay | Admitting: Internal Medicine

## 2024-05-03 ENCOUNTER — Ambulatory Visit: Admitting: Internal Medicine

## 2024-05-03 ENCOUNTER — Other Ambulatory Visit

## 2024-05-03 VITALS — BP 130/80 | HR 70 | Temp 98.4°F | Ht 77.0 in | Wt 308.8 lb

## 2024-05-03 DIAGNOSIS — Z7984 Long term (current) use of oral hypoglycemic drugs: Secondary | ICD-10-CM

## 2024-05-03 DIAGNOSIS — I5022 Chronic systolic (congestive) heart failure: Secondary | ICD-10-CM | POA: Diagnosis not present

## 2024-05-03 DIAGNOSIS — Z0001 Encounter for general adult medical examination with abnormal findings: Secondary | ICD-10-CM

## 2024-05-03 DIAGNOSIS — R7689 Other specified abnormal immunological findings in serum: Secondary | ICD-10-CM

## 2024-05-03 DIAGNOSIS — I4892 Unspecified atrial flutter: Secondary | ICD-10-CM

## 2024-05-03 DIAGNOSIS — I10 Essential (primary) hypertension: Secondary | ICD-10-CM

## 2024-05-03 DIAGNOSIS — E785 Hyperlipidemia, unspecified: Secondary | ICD-10-CM

## 2024-05-03 DIAGNOSIS — Z23 Encounter for immunization: Secondary | ICD-10-CM | POA: Diagnosis not present

## 2024-05-03 DIAGNOSIS — N1831 Chronic kidney disease, stage 3a: Secondary | ICD-10-CM

## 2024-05-03 DIAGNOSIS — I483 Typical atrial flutter: Secondary | ICD-10-CM

## 2024-05-03 DIAGNOSIS — Z0000 Encounter for general adult medical examination without abnormal findings: Secondary | ICD-10-CM

## 2024-05-03 DIAGNOSIS — Z1211 Encounter for screening for malignant neoplasm of colon: Secondary | ICD-10-CM

## 2024-05-03 DIAGNOSIS — D6869 Other thrombophilia: Secondary | ICD-10-CM

## 2024-05-03 DIAGNOSIS — E1169 Type 2 diabetes mellitus with other specified complication: Secondary | ICD-10-CM

## 2024-05-03 DIAGNOSIS — Z7985 Long-term (current) use of injectable non-insulin antidiabetic drugs: Secondary | ICD-10-CM

## 2024-05-03 DIAGNOSIS — E118 Type 2 diabetes mellitus with unspecified complications: Secondary | ICD-10-CM | POA: Diagnosis not present

## 2024-05-03 LAB — COMPREHENSIVE METABOLIC PANEL WITH GFR
ALT: 45 U/L (ref 3–53)
AST: 24 U/L (ref 5–37)
Albumin: 4.2 g/dL (ref 3.5–5.2)
Alkaline Phosphatase: 88 U/L (ref 39–117)
BUN: 24 mg/dL — ABNORMAL HIGH (ref 6–23)
CO2: 28 meq/L (ref 19–32)
Calcium: 9.1 mg/dL (ref 8.4–10.5)
Chloride: 105 meq/L (ref 96–112)
Creatinine, Ser: 1.67 mg/dL — ABNORMAL HIGH (ref 0.40–1.50)
GFR: 42.5 mL/min — ABNORMAL LOW
Glucose, Bld: 170 mg/dL — ABNORMAL HIGH (ref 70–99)
Potassium: 4.3 meq/L (ref 3.5–5.1)
Sodium: 140 meq/L (ref 135–145)
Total Bilirubin: 0.4 mg/dL (ref 0.2–1.2)
Total Protein: 7.5 g/dL (ref 6.0–8.3)

## 2024-05-03 LAB — CBC
HCT: 44.7 % (ref 39.0–52.0)
Hemoglobin: 14.6 g/dL (ref 13.0–17.0)
MCHC: 32.7 g/dL (ref 30.0–36.0)
MCV: 89.5 fl (ref 78.0–100.0)
Platelets: 213 K/uL (ref 150.0–400.0)
RBC: 4.99 Mil/uL (ref 4.22–5.81)
RDW: 14.3 % (ref 11.5–15.5)
WBC: 3.6 K/uL — ABNORMAL LOW (ref 4.0–10.5)

## 2024-05-03 LAB — LIPID PANEL
Cholesterol: 152 mg/dL (ref 28–200)
HDL: 46.5 mg/dL
LDL Cholesterol: 93 mg/dL (ref 10–99)
NonHDL: 105.98
Total CHOL/HDL Ratio: 3
Triglycerides: 67 mg/dL (ref 10.0–149.0)
VLDL: 13.4 mg/dL (ref 0.0–40.0)

## 2024-05-03 NOTE — Progress Notes (Unsigned)
 "  Subjective:   Patient ID: Johnny [name], male    DOB: 1956/03/18, 68 y.o.   MRN: [id number]  Discussed the use of AI scribe software for clinical note transcription with the patient, who gave verbal consent to proceed.  History of Present Illness Johnny [name] is a 68 year old male who presents for follow-up and desires physical as well to avoid duplicate visits.   He experiences intermittent discomfort in both knees due to significant cartilage loss, with recent aggravation of the right knee while working outside. He underwent knee surgery last year, which was successful. He has not had a knee replacement yet.  He has a history of atrial flutter and underwent an ablation procedure. Since the procedure, he has not experienced any episodes of fast heart rate. He restarted Eliquis  as per his cardiologist's recommendation and has not had any chest pain or other cardiac symptoms since.  He underwent prostate surgery in the past and reports no urinary issues since then. He was previously on alfuzosin  but discontinued it after not observing any blood in his urine. He is scheduled to see his urologist in January for a follow-up and a blood test.  He is currently taking hydralazine , Imdur , Coreg , and a fluid pill, likely olmesartan  hydrochlorothiazide , for blood pressure management. His blood sugar levels have been stable.  He has not had a colon cancer screening in over ten years. He experiences tingling in his feet, which varies in intensity, but denies any new numbness or burning pain. He uses Ace Arch supports, which have alleviated foot soreness after work.  Review of Systems  Constitutional: Negative.   HENT: Negative.    Eyes: Negative.   Respiratory:  Negative for cough, chest tightness and shortness of breath.   Cardiovascular:  Negative for chest pain, palpitations and leg swelling.  Gastrointestinal:  Negative for abdominal distention, abdominal pain, constipation, diarrhea, nausea  and vomiting.  Musculoskeletal: Negative.   Skin: Negative.   Neurological: Negative.   Psychiatric/Behavioral: Negative.      Objective:  Physical Exam Constitutional:      Appearance: He is well-developed. He is obese.  HENT:     Head: Normocephalic and atraumatic.  Cardiovascular:     Rate and Rhythm: Normal rate and regular rhythm.  Pulmonary:     Effort: Pulmonary effort is normal. No respiratory distress.     Breath sounds: Normal breath sounds. No wheezing or rales.  Abdominal:     General: Bowel sounds are normal. There is no distension.     Palpations: Abdomen is soft.     Tenderness: There is no abdominal tenderness.  Musculoskeletal:     Cervical back: Normal range of motion.  Skin:    General: Skin is warm and dry.     Comments: Foot exam done  Neurological:     Mental Status: He is alert and oriented to person, place, and time.     Coordination: Coordination normal.     Vitals:   05/03/24 0952  BP: 130/80  Pulse: 70  Temp: 98.4 F (36.9 C)  TempSrc: Oral  SpO2: 97%  Weight: (!) 308 lb 12.8 oz (140.1 kg)  Height: 6' 5 (1.956 m)   Prevnar 20 given at visit Assessment and Plan Assessment & Plan Type 2 diabetes mellitus with diabetic polyneuropathy   Blood glucose is well-controlled. He experiences intermittent foot tingling due to diabetic polyneuropathy. Continue current diabetes management regimen. Blood work ordered for kidney function and cholesterol levels. Foot exam done  Atrial flutter, status post ablation, on anticoagulation   No recent tachycardia episodes post-ablation. He resumed Eliquis  without chest pain or dyspnea. Continue Eliquis  as prescribed. Consult cardiologist regarding future echocardiogram.  Hypertension   Blood pressure is managed with current medications. Continue current antihypertensive medications.  Bilateral knee osteoarthritis, post-surgical   Intermittent right knee pain post-surgery, likely from cartilage loss and  activity. Potential knee replacement discussed. Monitor knee symptoms and consider knee replacement if symptoms worsen.  History of benign prostatic hyperplasia, post-prostate surgery   No urinary symptoms post-surgery. Not currently taking any medications. Continue follow-up with urologist in January for blood test and evaluation.   "

## 2024-05-03 NOTE — Patient Instructions (Signed)
 We will check the labs today.

## 2024-05-04 ENCOUNTER — Encounter: Payer: Self-pay | Admitting: Internal Medicine

## 2024-05-04 MED ORDER — ISOSORBIDE MONONITRATE ER 60 MG PO TB24: 60.0000 mg | ORAL_TABLET | Freq: Every day | ORAL | 3 refills | Status: AC

## 2024-05-04 MED ORDER — CARVEDILOL 25 MG PO TABS: 25.0000 mg | ORAL_TABLET | Freq: Two times a day (BID) | ORAL | 3 refills | Status: AC

## 2024-05-04 MED ORDER — OLMESARTAN MEDOXOMIL-HCTZ 40-12.5 MG PO TABS: 1.0000 | ORAL_TABLET | Freq: Every day | ORAL | 3 refills | Status: AC

## 2024-05-04 MED ORDER — HYDRALAZINE HCL 50 MG PO TABS: 50.0000 mg | ORAL_TABLET | Freq: Three times a day (TID) | ORAL | 3 refills | Status: AC

## 2024-05-04 NOTE — Assessment & Plan Note (Signed)
Checking lipid panel and adjust lipitor as needed.  

## 2024-05-04 NOTE — Assessment & Plan Note (Signed)
 Taking eliquis  and will continue. Overdue for BMP for monitoring of dosage.

## 2024-05-04 NOTE — Assessment & Plan Note (Signed)
 Flu shot declines. Pneumonia 20 given. Shingrix due at pharmacy. Tetanus up to date. Colonoscopy referral to GI done. Counseled about sun safety and mole surveillance. Counseled about the dangers of distracted driving. Given 10 year screening recommendations.

## 2024-05-04 NOTE — Assessment & Plan Note (Signed)
 BMI 36 and complicated by multiple conditions including diabetes. Counseled about diet and exercise.

## 2024-05-04 NOTE — Assessment & Plan Note (Signed)
 Has not seen regular cardiology in some time and no repeat echo in some time to assess.

## 2024-05-04 NOTE — Assessment & Plan Note (Signed)
 Stage unknown not enough labs for stability and none in >1 year. Checking CMP and adjust stage as needed. He has good BP today but historically unknown control. Diabetes is well controlled currently and encouraged continued control of both.

## 2024-05-04 NOTE — Assessment & Plan Note (Signed)
 BP is at goal today on regimen of coreg  25 mg BID, hydralazine  50 mg TID, imdur  60 mg daily, olmesartan /hydrochlorothiazide  40/12.5 mg daily and refilled all. He is unsure if he is taking furosemide . Checking CMP and adjust as needed.

## 2024-05-04 NOTE — Assessment & Plan Note (Addendum)
 Blood glucose is well-controlled. He experiences intermittent foot tingling due to diabetic polyneuropathy. Continue current diabetes management regimen with endo. Blood work ordered for kidney function and cholesterol levels. Foot exam done.

## 2024-05-04 NOTE — Assessment & Plan Note (Signed)
 Is taking eliquis  currently and under monitoring s/p ablation for recurrence.

## 2024-05-04 NOTE — Assessment & Plan Note (Signed)
 No current symptoms and not on meds will monitor for recurrence.

## 2024-05-05 ENCOUNTER — Encounter

## 2024-05-10 LAB — CORTISOL, SALIVARY: Cortisol, Saliva: 0.04 ug/dL

## 2024-05-11 ENCOUNTER — Ambulatory Visit: Payer: Self-pay | Admitting: Internal Medicine

## 2024-05-11 LAB — CORTISOL, SALIVARY: Cortisol, Saliva: 0.06 ug/dL

## 2024-05-22 ENCOUNTER — Ambulatory Visit: Attending: Cardiology

## 2024-05-22 DIAGNOSIS — I4892 Unspecified atrial flutter: Secondary | ICD-10-CM

## 2024-05-23 ENCOUNTER — Encounter: Payer: Self-pay | Admitting: Internal Medicine

## 2024-05-23 ENCOUNTER — Ambulatory Visit: Admitting: Internal Medicine

## 2024-05-23 ENCOUNTER — Other Ambulatory Visit (HOSPITAL_COMMUNITY): Payer: Self-pay

## 2024-05-23 ENCOUNTER — Telehealth: Payer: Self-pay

## 2024-05-23 VITALS — BP 160/90 | Ht 77.0 in | Wt 305.0 lb

## 2024-05-23 DIAGNOSIS — Z7984 Long term (current) use of oral hypoglycemic drugs: Secondary | ICD-10-CM

## 2024-05-23 DIAGNOSIS — N183 Chronic kidney disease, stage 3 unspecified: Secondary | ICD-10-CM

## 2024-05-23 DIAGNOSIS — E1165 Type 2 diabetes mellitus with hyperglycemia: Secondary | ICD-10-CM

## 2024-05-23 DIAGNOSIS — R809 Proteinuria, unspecified: Secondary | ICD-10-CM

## 2024-05-23 DIAGNOSIS — D3501 Benign neoplasm of right adrenal gland: Secondary | ICD-10-CM

## 2024-05-23 DIAGNOSIS — E1142 Type 2 diabetes mellitus with diabetic polyneuropathy: Secondary | ICD-10-CM | POA: Diagnosis not present

## 2024-05-23 DIAGNOSIS — Z7985 Long-term (current) use of injectable non-insulin antidiabetic drugs: Secondary | ICD-10-CM

## 2024-05-23 DIAGNOSIS — D3502 Benign neoplasm of left adrenal gland: Secondary | ICD-10-CM

## 2024-05-23 DIAGNOSIS — N1831 Chronic kidney disease, stage 3a: Secondary | ICD-10-CM

## 2024-05-23 DIAGNOSIS — E1122 Type 2 diabetes mellitus with diabetic chronic kidney disease: Secondary | ICD-10-CM

## 2024-05-23 LAB — POCT GLYCOSYLATED HEMOGLOBIN (HGB A1C): Hemoglobin A1C: 7.8 % — AB (ref 4.0–5.6)

## 2024-05-23 MED ORDER — TIRZEPATIDE 10 MG/0.5ML ~~LOC~~ SOAJ: 10.0000 mg | SUBCUTANEOUS | 3 refills | Status: AC

## 2024-05-23 NOTE — Patient Instructions (Addendum)
-    Mounjaro  10 mg  once week  - Take Glipizide  5 mg, half a tablet before breakfast and 1 tablet before supper      - HOW TO TREAT LOW BLOOD SUGARS (Blood sugar LESS THAN 70 MG/DL) Please follow the RULE OF 15 for the treatment of hypoglycemia treatment (when your (blood sugars are less than 70 mg/dL)   STEP 1: Take 15 grams of carbohydrates when your blood sugar is low, which includes:  3-4 GLUCOSE TABS  OR 3-4 OZ OF JUICE OR REGULAR SODA OR ONE TUBE OF GLUCOSE GEL    STEP 2: RECHECK blood sugar in 15 MINUTES STEP 3: If your blood sugar is still low at the 15 minute recheck --> then, go back to STEP 1 and treat AGAIN with another 15 grams of carbohydrates.

## 2024-05-23 NOTE — Telephone Encounter (Signed)
 Please look into moujaro 10mg  for patient.  He said it cost to much.

## 2024-05-23 NOTE — Progress Notes (Signed)
 "      Name: Johnny [name]  Age/ Sex: 69 y.o., male   MRN/ DOB: [id number], 20-May-1955     PCP: Rollene Almarie LABOR, MD   Reason for Endocrinology Evaluation: Type 2 Diabetes Mellitus  Initial Endocrine Consultative Visit: 08/20/2018    PATIENT IDENTIFIER: [name] is a 69 y.o. male with a past medical history of T2DM, psoriasis, HTN and OSA (Dx 08/2019) . The patient has followed with Endocrinology clinic since 08/20/2018 for consultative assistance with management of his diabetes.     DIABETIC HISTORY:  Johnny [name] was diagnosed with T2DM in 2012,. He was on Metformin ,Farxiga  and Glimepiride  in the past. He has never been on insulin . His hemoglobin A1c has ranged from 9.0 % in 2013, peaking at 13.5% in 06/2018.  On his initial visit to our clinic his A1c was 13.5%. He was not taking any of his meds. He was restarted on Metformin  and glipizide .   Trulicity  was started 02/2020  Stop metformin  due to low GFR 05/2022  Switch Trulicity  to Mounjaro  11/2022  Mounjaro  and Jardiance  were discontinued during hospitalization in 2024, we restarted Mounjaro  03/2023  ADRENAL HISTORY: Upon review of his chart he was noted to have bilateral adrenal nodules on CT abdomen and pelvis from 03/2020.  Right adrenal nodule 3.3 cm and left adrenal nodule 2.8 cm  He did have normal renin and Aldo during evaluation for uncontrolled HTN in 09/2018   Aldo and renin normal March, 2025  Dexamethasone  suppression test was elevated with a serum cortisol 5.7 mcg/dL    Salivary cortisol normal x2 04/2024   SUBJECTIVE:   During the last visit (9/19//2025): A1c 7.4 %.     Today (05/23/2024): Johnny [name] is here for a  follow up on his diabetes.  He continues to check glucose at home daily .  No hypoglycemia  Follows with Podiatry - Dr. Tobie 02/17/2024 Weight continues to trend down No nausea  NO constipation or diarrhea  No urinary frequency   Patient continues to take Mounjaro  7.5 mg as  the 10 mg dose had a very high co-pay   HOME DIABETES REGIMEN:  Glipizide  5 mg,  1 tablet daily Mounjaro  10 mg weekly -continue to take 7.5 mg    METER DOWNLOAD SUMMARY:    DIABETIC COMPLICATIONS: Microvascular complications:  Neuropathy, CKD Denies: retinopathy  Last eye exam: Completed 05/2020   Macrovascular complications:    Denies: CAD, PVD, CVA     HISTORY:  Past Medical History:  Past Medical History:  Diagnosis Date   Arthritis    CKD (chronic kidney disease) stage 3, GFR 30-59 ml/min (HCC) 06/10/2017   Constipation 03/27/2020   Diabetes mellitus    Dysrhythmia    A flutter   Essential hypertension 08/03/2007   Qualifier: Diagnosis of  By: Harlow MD, Ozell BRAVO  Med ARB, diuretic     Guttate psoriasis    Hyperlipidemia associated with type 2 diabetes mellitus (HCC) 08/03/2007   Qualifier: Diagnosis of  By: Harlow MD, Ozell BRAVO  meds - none    Paroxysmal atrial flutter (HCC)    S/P TEE/DCCV 07/2019   PSORIASIS, 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   Qualifier: Diagnosis of  By: Harlow MD, Ozell BRAVO    Sleep apnea    Type II diabetes mellitus with renal manifestations, uncontrolled 08/03/2007   Qualifier: Diagnosis of  By: Harlow MD, Ozell BRAVO  Med metformin     Past Surgical History:  Past Surgical History:  Procedure Laterality Date  A-FLUTTER ABLATION N/A 04/30/2023   Procedure: A-FLUTTER ABLATION;  Surgeon: Kennyth Chew, MD;  Location: [facility] INVASIVE CV LAB;  Service: Cardiovascular;  Laterality: N/A;   CARDIOVERSION N/A 07/18/2019   Procedure: CARDIOVERSION;  Surgeon: Maranda Leim DEL, MD;  Location: [facility] ENDOSCOPY;  Service: Cardiovascular;  Laterality: N/A;   IRRIGATION AND DEBRIDEMENT KNEE Right 02/18/2023   Procedure: RIGHT KNEE ARTHROSCOPIC IRRIGATION AND DEBRIDEMENT;  Surgeon: Jerri Kay HERO, MD;  Location: [facility] ORS;  Service: Orthopedics;  Laterality: Right;   LOOP RECORDER INSERTION N/A 04/30/2023   Procedure: LOOP RECORDER INSERTION;  Surgeon: Kennyth Chew, MD;   Location: [facility] INVASIVE CV LAB;  Service: Cardiovascular;  Laterality: N/A;   TEE WITHOUT CARDIOVERSION N/A 07/18/2019   Procedure: TRANSESOPHAGEAL ECHOCARDIOGRAM (TEE);  Surgeon: Maranda Leim DEL, MD;  Location: [facility] ENDOSCOPY;  Service: Cardiovascular;  Laterality: N/A;   TOOTH EXTRACTION     XI ROBOTIC ASSISTED SIMPLE PROSTATECTOMY N/A 05/31/2020   Procedure: XI ROBOTIC ASSISTED SIMPLE PROSTATECTOMY-SUPRAPUBIC;  Surgeon: Sherrilee Belvie CROME, MD;  Location: [facility] ORS;  Service: Urology;  Laterality: N/A;   Social History:  reports that he has never smoked. He has been exposed to tobacco smoke. He has never used smokeless tobacco. He reports that he does not drink alcohol and does not use drugs. Family History:  Family History  Problem Relation Age of Onset   Memory loss Mother        DOB 52   Kidney disease Mother        s/p nephrectomy, infection problem   Dementia Mother    Hypertension Father        DOB [phone]   Hyperlipidemia Father    Diabetes Father    Coronary artery disease Father        with stents   Nephrolithiasis Father    Kidney disease Father        s/p rejected kidney transplant; on HD   Cancer Brother        lung   HIV Brother    Prostate cancer Neg Hx    Colon cancer Neg Hx      HOME MEDICATIONS: Allergies as of 05/23/2024   No Known Allergies      Medication List        Accurate as of May 23, 2024  2:55 PM. If you have any questions, ask your nurse or doctor.          STOP taking these medications    Mounjaro  7.5 MG/0.5ML Pen Generic drug: tirzepatide  Stopped by: Donell Butts, MD       TAKE these medications    albuterol  (5 MG/ML) 0.5% nebulizer solution Commonly known as: PROVENTIL  Inhalation   apixaban  5 MG Tabs tablet Commonly known as: Eliquis  Take 1 tablet (5 mg total) by mouth 2 (two) times daily.   atorvastatin  80 MG tablet Commonly known as: LIPITOR  TAKE 1 TABLET BY MOUTH EVERY DAY   carvedilol  25 MG  tablet Commonly known as: COREG  Take 1 tablet (25 mg total) by mouth 2 (two) times daily with a meal.   furosemide  20 MG tablet Commonly known as: LASIX  Oral   glipiZIDE  5 MG tablet Commonly known as: GLUCOTROL  Take 1 tablet (5 mg total) by mouth daily before breakfast.   hydrALAZINE  50 MG tablet Commonly known as: APRESOLINE  Take 1 tablet (50 mg total) by mouth 3 (three) times daily.   ipratropium 0.02 % nebulizer solution Commonly known as: ATROVENT  Inhalation   isosorbide  mononitrate 60 MG 24 hr tablet Commonly known  as: IMDUR  Take 1 tablet (60 mg total) by mouth daily.   olmesartan -hydrochlorothiazide  40-12.5 MG tablet Commonly known as: BENICAR  HCT Take 1 tablet by mouth daily.         OBJECTIVE:   Vital Signs: BP (!) 160/90   Ht 6' 5 (1.956 m)   Wt (!) 305 lb (138.3 kg)   BMI 36.17 kg/m   Wt Readings from Last 3 Encounters:  05/23/24 (!) 305 lb (138.3 kg)  05/03/24 (!) 308 lb 12.8 oz (140.1 kg)  01/22/24 (!) 303 lb (137.4 kg)     Exam: General: Pt appears well and is in NAD  Lungs: Clear with good BS bilat   Heart: RRR   Neuro: MS is good with appropriate affect, pt is alert and Ox3    DM foot exam: 02/17/2024 per podiatry   DATA REVIEWED:  Lab Results  Component Value Date   HGBA1C 7.8 (A) 05/23/2024   HGBA1C 7.4 (A) 01/22/2024   HGBA1C 6.7 (A) 07/21/2023    Latest Reference Range & Units 05/03/24 10:30  Sodium 135 - 145 mEq/L 140  Potassium 3.5 - 5.1 mEq/L 4.3  Chloride 96 - 112 mEq/L 105  CO2 19 - 32 mEq/L 28  Glucose 70 - 99 mg/dL 829 (H)  BUN 6 - 23 mg/dL 24 (H)  Creatinine 9.59 - 1.50 mg/dL 8.32 (H)  Calcium  8.4 - 10.5 mg/dL 9.1  Alkaline Phosphatase 39 - 117 U/L 88  Albumin 3.5 - 5.2 g/dL 4.2  AST 5 - 37 U/L 24  ALT 3 - 53 U/L 45  Total Protein 6.0 - 8.3 g/dL 7.5  Total Bilirubin 0.2 - 1.2 mg/dL 0.4  GFR >39.99 mL/min 42.50 (L)    Latest Reference Range & Units 05/03/24 10:30  Total CHOL/HDL Ratio  3  Cholesterol 28 -  200 mg/dL 847  HDL Cholesterol >60.99 mg/dL 53.49  LDL (calc) 10 - 99 mg/dL 93  NonHDL  894.01  Triglycerides 10.0 - 149.0 mg/dL 32.9  VLDL 0.0 - 59.9 mg/dL 86.5     MR abdomen 7/70/7975  FINDINGS: Lower chest: No acute abnormality.   Hepatobiliary: No solid liver abnormality is seen. No gallstones, gallbladder wall thickening, or biliary dilatation.   Pancreas: Unremarkable. No pancreatic ductal dilatation or surrounding inflammatory changes.   Spleen: Normal in size without significant abnormality.   Adrenals/Urinary Tract: Unchanged, definitively benign bilateral macroscopic fat containing adrenal nodules, measuring 3.3 x 2.5 cm on the right and 2.3 x 1.8 cm on the left (series 8, image 16). Kidneys are normal, without renal calculi, solid lesion, or hydronephrosis.   Stomach/Bowel: Stomach is within normal limits. No evidence of bowel wall thickening, distention, or inflammatory changes.   Vascular/Lymphatic: No significant vascular findings are present. No enlarged abdominal lymph nodes.   Other: No abdominal wall hernia or abnormality. No ascites.   Musculoskeletal: No acute or significant osseous findings.   IMPRESSION: Unchanged, definitively benign bilateral macroscopic fat containing adrenal nodules, for which no further follow-up or characterization is required.      ASSESSMENT / PLAN / RECOMMENDATIONS:   1) Type 2 Diabetes Mellitus, Sub -Optimally   controlled, With CKD III and neuropathic  complications - Most recent A1c of 7.8%. Goal A1c < 7.0 %.  - A1c continues to trend up -Metformin  was discontinued 05/2022 due to low GFR -Jardiance  was discontinued during hospitalization 2024, we started him on Mounjaro  at the time, today we entertained restarting SGLT2 inhibitors -He attempted to pick up Mounjaro  10 mg dose, co-pay  increased to $600, I did explain to the patient that he does not need to pick this up as this is too high, we may consider Jardiance   if the price is better -I will increase glipizide  at this time, historically he was having hypoglycemia on glipizide , if he continues with this, will consider switching glipizide  to repaglinide  MEDICATIONS:  Change glipizide   5 mg, half a tablet before breakfast, 1 tablet before Supper  Increase  Mounjaro  10 mg weekly   EDUCATION / INSTRUCTIONS: BG monitoring instructions: Patient is instructed to check his blood sugars 1 times a day, fasting Call [facility] if: BG persistently < 70  I reviewed the Rule of 15 for the treatment of hypoglycemia in detail with the patient. Literature supplied.   2.  Bilateral adrenal nodules:   -Work up negative 06/2021 and 05/2022 including renin, Aldo, 24-hour urinary cortisol and catecholamines -Adrenal imaging 06/2022 showed stability  - Aldo and renin are normal - Dexamethasone  suppression test was abnormal in March, 2025, will proceed with saliva cortisol testing x 2.  Historically has had normal 24-hour urinary cortisol testing x2   3. Microalbuminuria :  -This has trended up -Will continue to monitor   Medication Continue olmesartan -HCTZ daily    F/U in 3 months    Signed electronically by: Stefano Redgie Butts, MD  [facility] Endocrin[facility]  [facility] [address] [place] [place], [place] [address] Phone: [phone] FAX: [phone]   CC: Rollene Almarie LABOR, MD [address] [place] [address] Phone: [phone]  Fax: [phone]  Return to Endocrinology clinic as below: Future Appointments  Date Time Provider Department Center  05/25/2024  3:45 PM Tobie Franky SQUIBB, DPM [facility]  06/16/2024  2:00 PM [facility] None  06/22/2024  7:15 AM [facility] DEVICE REMOTES [facility]  06/22/2024  2:20 PM McKenzie, Belvie CROME, MD [facility] None  07/23/2024  7:15 AM [facility] DEVICE REMOTES [facility]  08/23/2024  7:15 AM [facility] DEVICE REMOTES [facility]  09/23/2024   7:15 AM [facility] DEVICE REMOTES [facility]  10/24/2024  7:15 AM [facility] DEVICE REMOTES [facility]  11/24/2024  7:15 AM [facility] DEVICE REMOTES [facility]  12/25/2024  7:15 AM [facility] DEVICE REMOTES [facility]  01/25/2025  7:15 AM [facility] DEVICE REMOTES [facility]    "

## 2024-05-24 LAB — CUP PACEART REMOTE DEVICE CHECK
Date Time Interrogation Session: 20260119153300
Implantable Pulse Generator Implant Date: 20241226
Pulse Gen Serial Number: 128227

## 2024-05-25 ENCOUNTER — Other Ambulatory Visit (HOSPITAL_COMMUNITY): Payer: Self-pay

## 2024-05-25 ENCOUNTER — Telehealth: Payer: Self-pay

## 2024-05-25 ENCOUNTER — Ambulatory Visit: Admitting: Podiatry

## 2024-05-25 DIAGNOSIS — M79674 Pain in right toe(s): Secondary | ICD-10-CM

## 2024-05-25 DIAGNOSIS — M79675 Pain in left toe(s): Secondary | ICD-10-CM | POA: Diagnosis not present

## 2024-05-25 DIAGNOSIS — B351 Tinea unguium: Secondary | ICD-10-CM | POA: Diagnosis not present

## 2024-05-25 NOTE — Telephone Encounter (Signed)
 Pharmacy Patient Advocate Encounter   Received notification from Patient Advice Request messages that prior authorization for Mounjaro  10mg  is required/requested.   Insurance verification completed.   The patient is insured through [facility].   Per test claim: REFILL TOO SOON. It looks like he filled the lower dose of 7.5mg  on 04/18/2024 for 84 day supply. They won't let him fill the 10mg  until he has used what he has on hand. Next fill 06/20/2024

## 2024-05-25 NOTE — Progress Notes (Signed)
"  °  Subjective:  Patient ID: Johnny [name], male    DOB: 19-Feb-1956,  MRN: [id number]  Chief Complaint  Patient presents with   Nail Problem    Patient is here for Pain due to onychomycosis of toenails of both feet     69 y.o. male returns for the above complaint.  Patient presents with thickened elongated dystrophic toenails x10.  Patient is a painful to walk on.  Patient would like to have them debrided down as he is not able to do it himself.  Patient is a diabetic with last A1c of 7.6.  He does not have any secondary complaints.  Objective:  There were no vitals filed for this visit. Podiatric Exam: Vascular: dorsalis pedis and posterior tibial pulses are palpable bilateral. Capillary return is immediate. Temperature gradient is WNL. Skin turgor WNL  Sensorium: Normal Semmes Weinstein monofilament test. Normal tactile sensation bilaterally. Nail Exam: Pt has thick disfigured discolored nails with subungual debris noted bilateral entire nail hallux through fifth toenails.  Pain on palpation to the nails. Ulcer Exam: There is no evidence of ulcer or pre-ulcerative changes or infection. Orthopedic Exam: Muscle tone and strength are WNL. No limitations in general ROM. No crepitus or effusions noted. HAV  B/L.  Hammer toes 2-5  B/L. Skin: No Porokeratosis. No infection or ulcers.  Bilateral severe xerosis noted to bilateral lower extremity    Assessment & Plan:   No diagnosis found.         Patient was evaluated and treated and all questions answered.  Xerosis skin severe bilateral lower extremity -Clinically improved   Onychomycosis with pain  -Nails palliatively debrided as below. -Educated on self-care  Procedure: Nail Debridement Rationale: pain  Type of Debridement: manual, sharp debridement. Instrumentation: Nail nipper, rotary burr. Number of Nails: 10  Procedures and Treatment: Consent by patient was obtained for treatment procedures. The patient understood  the discussion of treatment and procedures well. All questions were answered thoroughly reviewed. Debridement of mycotic and hypertrophic toenails, 1 through 5 bilateral and clearing of subungual debris. No ulceration, no infection noted.  Return Visit-Office Procedure: Patient instructed to return to the office for a follow up visit 3 months for continued evaluation and treatment.  Franky Blanch, DPM    No follow-ups on file. "

## 2024-05-27 NOTE — Progress Notes (Signed)
 Remote Loop Recorder Transmission

## 2024-05-29 ENCOUNTER — Ambulatory Visit: Payer: Self-pay | Admitting: Cardiology

## 2024-06-06 ENCOUNTER — Encounter

## 2024-06-16 ENCOUNTER — Other Ambulatory Visit: Payer: BC Managed Care – PPO

## 2024-06-22 ENCOUNTER — Ambulatory Visit

## 2024-06-22 ENCOUNTER — Ambulatory Visit: Payer: BC Managed Care – PPO | Admitting: Urology

## 2024-07-23 ENCOUNTER — Ambulatory Visit

## 2024-08-23 ENCOUNTER — Ambulatory Visit

## 2024-08-24 ENCOUNTER — Ambulatory Visit: Admitting: Podiatry

## 2024-09-23 ENCOUNTER — Ambulatory Visit

## 2024-09-27 ENCOUNTER — Ambulatory Visit: Admitting: Internal Medicine

## 2024-10-24 ENCOUNTER — Ambulatory Visit

## 2024-11-24 ENCOUNTER — Ambulatory Visit

## 2024-12-25 ENCOUNTER — Ambulatory Visit

## 2025-01-25 ENCOUNTER — Ambulatory Visit
